# Patient Record
Sex: Female | Born: 1950 | Race: White | Hispanic: No | Smoking: Never smoker
Health system: Southern US, Community
[De-identification: ages and names within clinical notes are randomized; demographics above are authoritative.]

## PROBLEM LIST (undated history)

## (undated) DIAGNOSIS — E785 Hyperlipidemia, unspecified: Secondary | ICD-10-CM

## (undated) DIAGNOSIS — F419 Anxiety disorder, unspecified: Secondary | ICD-10-CM

## (undated) DIAGNOSIS — M1990 Unspecified osteoarthritis, unspecified site: Secondary | ICD-10-CM

## (undated) DIAGNOSIS — R55 Syncope and collapse: Secondary | ICD-10-CM

## (undated) DIAGNOSIS — R0789 Other chest pain: Secondary | ICD-10-CM

## (undated) DIAGNOSIS — K219 Gastro-esophageal reflux disease without esophagitis: Secondary | ICD-10-CM

## (undated) DIAGNOSIS — I5189 Other ill-defined heart diseases: Secondary | ICD-10-CM

## (undated) DIAGNOSIS — R002 Palpitations: Secondary | ICD-10-CM

## (undated) DIAGNOSIS — I10 Essential (primary) hypertension: Secondary | ICD-10-CM

## (undated) DIAGNOSIS — I82409 Acute embolism and thrombosis of unspecified deep veins of unspecified lower extremity: Secondary | ICD-10-CM

## (undated) DIAGNOSIS — J690 Pneumonitis due to inhalation of food and vomit: Secondary | ICD-10-CM

## (undated) DIAGNOSIS — R197 Diarrhea, unspecified: Secondary | ICD-10-CM

## (undated) DIAGNOSIS — M8580 Other specified disorders of bone density and structure, unspecified site: Secondary | ICD-10-CM

## (undated) HISTORY — PX: KNEE ARTHROSCOPY: SUR90

## (undated) HISTORY — DX: Anxiety disorder, unspecified: F41.9

## (undated) HISTORY — PX: REPLACEMENT TOTAL KNEE: SUR1224

## (undated) HISTORY — DX: Diarrhea, unspecified: R19.7

## (undated) HISTORY — DX: Unspecified osteoarthritis, unspecified site: M19.90

## (undated) HISTORY — DX: Other chest pain: R07.89

## (undated) HISTORY — DX: Palpitations: R00.2

## (undated) HISTORY — DX: Hyperlipidemia, unspecified: E78.5

## (undated) HISTORY — DX: Acute embolism and thrombosis of unspecified deep veins of unspecified lower extremity: I82.409

## (undated) HISTORY — DX: Pneumonitis due to inhalation of food and vomit: J69.0

## (undated) HISTORY — DX: Other specified disorders of bone density and structure, unspecified site: M85.80

## (undated) HISTORY — DX: Syncope and collapse: R55

## (undated) HISTORY — DX: Other ill-defined heart diseases: I51.89

---

## 1978-06-24 HISTORY — PX: TUBAL LIGATION: SHX77

## 1998-09-09 ENCOUNTER — Other Ambulatory Visit: Admission: RE | Admit: 1998-09-09 | Discharge: 1998-09-09 | Payer: Self-pay | Admitting: Internal Medicine

## 1998-10-03 ENCOUNTER — Encounter: Payer: Self-pay | Admitting: Internal Medicine

## 1998-10-03 ENCOUNTER — Ambulatory Visit (HOSPITAL_COMMUNITY): Admission: RE | Admit: 1998-10-03 | Discharge: 1998-10-03 | Payer: Self-pay | Admitting: Internal Medicine

## 1998-10-21 ENCOUNTER — Encounter: Payer: Self-pay | Admitting: Internal Medicine

## 1998-10-21 ENCOUNTER — Ambulatory Visit (HOSPITAL_COMMUNITY): Admission: RE | Admit: 1998-10-21 | Discharge: 1998-10-21 | Payer: Self-pay

## 1999-01-18 ENCOUNTER — Emergency Department (HOSPITAL_COMMUNITY): Admission: EM | Admit: 1999-01-18 | Discharge: 1999-01-18 | Payer: Self-pay | Admitting: Emergency Medicine

## 1999-01-19 ENCOUNTER — Encounter: Payer: Self-pay | Admitting: Emergency Medicine

## 1999-01-19 ENCOUNTER — Ambulatory Visit (HOSPITAL_COMMUNITY): Admission: RE | Admit: 1999-01-19 | Discharge: 1999-01-19 | Payer: Self-pay | Admitting: Emergency Medicine

## 1999-04-20 ENCOUNTER — Ambulatory Visit (HOSPITAL_COMMUNITY): Admission: RE | Admit: 1999-04-20 | Discharge: 1999-04-20 | Payer: Self-pay | Admitting: Internal Medicine

## 1999-04-20 ENCOUNTER — Encounter: Payer: Self-pay | Admitting: Internal Medicine

## 2000-01-31 ENCOUNTER — Other Ambulatory Visit: Admission: RE | Admit: 2000-01-31 | Discharge: 2000-01-31 | Payer: Self-pay | Admitting: Obstetrics

## 2000-02-01 ENCOUNTER — Encounter (INDEPENDENT_AMBULATORY_CARE_PROVIDER_SITE_OTHER): Payer: Self-pay | Admitting: Specialist

## 2000-02-01 ENCOUNTER — Other Ambulatory Visit: Admission: RE | Admit: 2000-02-01 | Discharge: 2000-02-01 | Payer: Self-pay | Admitting: Obstetrics

## 2000-03-12 ENCOUNTER — Emergency Department (HOSPITAL_COMMUNITY): Admission: EM | Admit: 2000-03-12 | Discharge: 2000-03-12 | Payer: Self-pay

## 2000-10-15 ENCOUNTER — Ambulatory Visit (HOSPITAL_BASED_OUTPATIENT_CLINIC_OR_DEPARTMENT_OTHER): Admission: RE | Admit: 2000-10-15 | Discharge: 2000-10-15 | Payer: Self-pay | Admitting: Orthopaedic Surgery

## 2001-01-13 ENCOUNTER — Encounter: Admission: RE | Admit: 2001-01-13 | Discharge: 2001-02-11 | Payer: Self-pay | Admitting: Orthopaedic Surgery

## 2001-03-31 ENCOUNTER — Other Ambulatory Visit: Admission: RE | Admit: 2001-03-31 | Discharge: 2001-03-31 | Payer: Self-pay | Admitting: Internal Medicine

## 2001-04-04 ENCOUNTER — Emergency Department (HOSPITAL_COMMUNITY): Admission: EM | Admit: 2001-04-04 | Discharge: 2001-04-05 | Payer: Self-pay | Admitting: Emergency Medicine

## 2001-10-21 ENCOUNTER — Encounter: Payer: Self-pay | Admitting: Emergency Medicine

## 2001-10-21 ENCOUNTER — Emergency Department (HOSPITAL_COMMUNITY): Admission: EM | Admit: 2001-10-21 | Discharge: 2001-10-21 | Payer: Self-pay | Admitting: Emergency Medicine

## 2001-12-09 ENCOUNTER — Ambulatory Visit (HOSPITAL_COMMUNITY): Admission: RE | Admit: 2001-12-09 | Discharge: 2001-12-09 | Payer: Self-pay | Admitting: Internal Medicine

## 2002-01-08 ENCOUNTER — Encounter: Admission: RE | Admit: 2002-01-08 | Discharge: 2002-01-08 | Payer: Self-pay | Admitting: Internal Medicine

## 2002-01-08 ENCOUNTER — Encounter: Payer: Self-pay | Admitting: Internal Medicine

## 2002-10-08 ENCOUNTER — Encounter (INDEPENDENT_AMBULATORY_CARE_PROVIDER_SITE_OTHER): Payer: Self-pay | Admitting: *Deleted

## 2002-10-08 ENCOUNTER — Ambulatory Visit (HOSPITAL_COMMUNITY): Admission: RE | Admit: 2002-10-08 | Discharge: 2002-10-08 | Payer: Self-pay | Admitting: Gastroenterology

## 2003-01-12 ENCOUNTER — Encounter: Payer: Self-pay | Admitting: Internal Medicine

## 2003-01-12 ENCOUNTER — Encounter: Admission: RE | Admit: 2003-01-12 | Discharge: 2003-01-12 | Payer: Self-pay | Admitting: Internal Medicine

## 2003-07-03 ENCOUNTER — Encounter
Admission: RE | Admit: 2003-07-03 | Discharge: 2003-07-03 | Payer: Self-pay | Admitting: Physical Medicine and Rehabilitation

## 2003-07-03 ENCOUNTER — Encounter: Payer: Self-pay | Admitting: Physical Medicine and Rehabilitation

## 2004-01-18 ENCOUNTER — Ambulatory Visit (HOSPITAL_COMMUNITY): Admission: RE | Admit: 2004-01-18 | Discharge: 2004-01-18 | Payer: Self-pay | Admitting: Internal Medicine

## 2004-05-12 ENCOUNTER — Emergency Department (HOSPITAL_COMMUNITY): Admission: EM | Admit: 2004-05-12 | Discharge: 2004-05-12 | Payer: Self-pay | Admitting: Family Medicine

## 2004-09-24 HISTORY — PX: CHOLECYSTECTOMY: SHX55

## 2005-03-15 ENCOUNTER — Emergency Department (HOSPITAL_COMMUNITY): Admission: EM | Admit: 2005-03-15 | Discharge: 2005-03-15 | Payer: Self-pay | Admitting: Emergency Medicine

## 2005-04-23 ENCOUNTER — Emergency Department (HOSPITAL_COMMUNITY): Admission: EM | Admit: 2005-04-23 | Discharge: 2005-04-23 | Payer: Self-pay | Admitting: Family Medicine

## 2005-11-19 ENCOUNTER — Emergency Department (HOSPITAL_COMMUNITY): Admission: EM | Admit: 2005-11-19 | Discharge: 2005-11-19 | Payer: Self-pay | Admitting: Family Medicine

## 2006-05-22 ENCOUNTER — Emergency Department (HOSPITAL_COMMUNITY): Admission: EM | Admit: 2006-05-22 | Discharge: 2006-05-22 | Payer: Self-pay | Admitting: Family Medicine

## 2009-04-07 ENCOUNTER — Emergency Department (HOSPITAL_COMMUNITY): Admission: EM | Admit: 2009-04-07 | Discharge: 2009-04-07 | Payer: Self-pay | Admitting: Family Medicine

## 2010-04-09 ENCOUNTER — Emergency Department (HOSPITAL_COMMUNITY): Admission: EM | Admit: 2010-04-09 | Discharge: 2010-04-09 | Payer: Self-pay | Admitting: Emergency Medicine

## 2010-12-09 LAB — POCT CARDIAC MARKERS
CKMB, poc: <1 ng/mL — ABNORMAL LOW (ref 1.0–8.0)
Myoglobin, poc: 44.1 ng/mL (ref 12–200)
Troponin i, poc: <0.05 ng/mL (ref 0.00–0.09)

## 2010-12-09 LAB — COMPREHENSIVE METABOLIC PANEL
Albumin: 3.9 g/dL (ref 3.5–5.2)
BUN: 18 mg/dL (ref 6–23)
CO2: 25 mEq/L (ref 19–32)
Calcium: 9 mg/dL (ref 8.4–10.5)
Creatinine, Ser: 0.86 mg/dL (ref 0.4–1.2)
GFR calc non Af Amer: >60 mL/min (ref >60)
Glucose, Bld: 94 mg/dL (ref 70–99)
Sodium: 140 mEq/L (ref 135–145)
Total Protein: 6.8 g/dL (ref 6.0–8.3)

## 2010-12-09 LAB — DIFFERENTIAL
Basophils Absolute: 0 10*3/uL (ref 0.0–0.1)
Basophils Relative: 1 % (ref 0–1)
Eosinophils Absolute: 0.1 10*3/uL (ref 0.0–0.7)
Eosinophils Relative: 1 % (ref 0–5)
Lymphocytes Relative: 28 % (ref 12–46)
Lymphs Abs: 1.9 10*3/uL (ref 0.7–4.0)
Monocytes Absolute: 0.5 10*3/uL (ref 0.1–1.0)
Monocytes Relative: 8 % (ref 3–12)
Neutro Abs: 4.2 10*3/uL (ref 1.7–7.7)
Neutrophils Relative %: 63 % (ref 43–77)

## 2010-12-09 LAB — CBC
Hemoglobin: 14.2 g/dL (ref 12.0–15.0)
MCH: 31.8 pg (ref 26.0–34.0)
Platelets: 278 10*3/uL (ref 150–400)
RBC: 4.45 MIL/uL (ref 3.87–5.11)
RDW: 12.8 % (ref 11.5–15.5)
WBC: 6.7 10*3/uL (ref 4.0–10.5)

## 2010-12-09 LAB — TSH: TSH: 1.494 u[IU]/mL (ref 0.350–4.500)

## 2010-12-09 LAB — T4: T4, Total: 10.8 ug/dL (ref 5.0–12.5)

## 2010-12-31 LAB — POCT URINALYSIS DIP (DEVICE)
Bilirubin Urine: NEGATIVE
Nitrite: NEGATIVE
Protein, ur: NEGATIVE mg/dL
Specific Gravity, Urine: 1.015 (ref 1.005–1.030)
Urobilinogen, UA: 0.2 mg/dL (ref 0.0–1.0)
pH: 5.5 (ref 5.0–8.0)

## 2011-02-09 NOTE — Op Note (Signed)
NAME:  Erin West, Erin West                          ACCOUNT NO.:  [id number]   MEDICAL RECORD NO.:  [id number]                   PATIENT TYPE:  AMB   LOCATION:  ENDO                                 FACILITY:  [facility]   PHYSICIAN:  Anselmo Rod, M.D.               DATE OF BIRTH:  [birth year]   DATE OF PROCEDURE:  10/08/2002  DATE OF DISCHARGE:                                 OPERATIVE REPORT   PROCEDURE:  Esophagogastroduodenoscopy with biopsies.   ENDOSCOPIST:  Anselmo Rod, M.D.   INSTRUMENT USED:  Olympus video panendoscope.   INDICATIONS FOR PROCEDURE:  A 60 year old white female with epigastric pain,  rule out peptic ulcer disease, esophagitis, gastritis, etc.   PREPROCEDURE PREPARATION:  Informed consent was procured from the patient  and the patient was fasted for eight hours prior to the procedure.   PREPROCEDURE PHYSICAL EXAMINATION:  VITAL SIGNS:  Stable.  NECK:  Supple.  CHEST:  Clear to auscultation.  S1 and S2 regular.  ABDOMEN:  Soft with normal bowel sounds.   DESCRIPTION OF PROCEDURE:  The patient was placed in the left lateral  decubitus position and sedated with 50 mg of Demerol and 4 mg of Versed  intravenously.  Once the patient was adequately sedated and maintained on  low flow oxygen and continuous cardiac monitoring, the Olympus video  panendoscope was advanced through the mouthpiece, over tongue, and into the  esophagus under direct vision. The entire esophagus appeared normal with no  evidence of ring, stricture, masses, esophagitis, or Barrett's mucosa. The  scope was then advanced to the stomach. There was a patch of gastritis seen  in the mid body of the stomach with antral erosions.  Biopsies were done  from the mid body and the antrum to rule out presence of H.pylori pathology.  The rest of the gastric mucosa including the high cardia and fundus appeared  normal. A small hiatal hernia was seen on high retroflexion.  The proximal  small bowel  appeared normal as well.   IMPRESSION:  1. Normal appearing esophagus and proximal small bowel.  2. Small hiatal hernia seen on retroflexion.  3. Antral erosions with a small patch of gastritis in mid body of the     stomach.  4. Biopsies done to rule out H.pylori by pathology.    RECOMMENDATIONS:  Continue PPIs.  Avoid all nonsteroidals including aspirin.  Treat with antibiotics if H.pylori present on pathology.  Proceed with  colonoscopy at this time.                                                Anselmo Rod, M.D.    JNM/MEDQ  D:  10/08/2002  T:  10/08/2002  Job:  [id number]   cc:   Melina Schools  Kennon Portela, M.D.  [address]  [address]  [place]  [place] [address]  Fax: [phone]

## 2011-02-09 NOTE — Op Note (Signed)
[facility]  Patient:    Erin West, Erin West                         MRN: [id number] Proc. Date: 10/15/00 Adm. Date:  [id number] Attending:  Marcene Corning                           Operative Report  PREOPERATIVE DIAGNOSES: 1. Right knee torn medial meniscus. 2. Right knee degenerative joint disease.  POSTOPERATIVE DIAGNOSIS:  Right knee degenerative joint disease.  PROCEDURE:  Chondroplasty, right knee.  ANESTHESIA:  Knee block.  SURGEON:  Lubertha Basque. Jerl Santos, M.D.  ASSISTANT:  Prince Rome, P.A.  INDICATION FOR PROCEDURE:  The patient is a 60 year old woman with a long history of right knee pain.  She received transient relief after intra-articular injection on two occasions.  She has failed to respond to oral anti-inflammatories.  At this point, she is offered operative intervention that consists of an arthroscopy.  The procedure was discussed with the patient, and informed operative consent was obtained after discussion of the possible complications of, reaction to anesthesia, and infection.  DESCRIPTION OF PROCEDURE:  The patient was taken to the operating suite, where a knee block anesthetic was applied without difficulty.  She was positioned supine and prepped and draped in normal sterile fashion.  After the administration of preoperative IV antibiotics, then arthroscopy of the right knee was performed through a total of two portals.  The suprapatellar pouch was benign, while the patellofemoral joint exhibited some mild degenerative change.  The medial compartment exhibited bone-on-bone contact with complete loss of articular cartilage on a portion of the medial femoral condyle and the medial tibial plateau.  The medial meniscus itself was intact.  The ACL and PCL were intact, and the lateral compartment was completely benign.  I performed a thorough chondroplasty in the medial aspect of the knee, smoothing off some rough surfaces.  I  removed some small cartilaginous loose bodies. The knee was thoroughly irrigated at the end of the case, followed by placement of Marcaine with epinephrine and morphine plus Depo-Medrol.  Adaptic was placed over the portals, followed by dry gauze and a loose Ace wrap. Estimated blood loss and intraoperative fluids can be obtained from anesthesia records.  DISPOSITION:  The patient was taken to the recovery room in stable condition. Plans were for her to go home the same day and follow up in the office in less than a week.  I will contact her by phone tonight. DD:  10/15/00 TD:  10/15/00 Job: [id number]

## 2011-02-09 NOTE — Op Note (Signed)
   NAME:  Erin West, Erin West                          ACCOUNT NO.:  [id number]   MEDICAL RECORD NO.:  [id number]                   PATIENT TYPE:  AMB   LOCATION:  ENDO                                 FACILITY:  [facility]   PHYSICIAN:  Anselmo Rod, M.D.               DATE OF BIRTH:  [birth year]   DATE OF PROCEDURE:  10/08/2002  DATE OF DISCHARGE:                                 OPERATIVE REPORT   PROCEDURE:  Colonoscopy with snare polypectomy x2 and cold biopsies x2.   ENDOSCOPIST:  Anselmo Rod, M.D.   INSTRUMENT USED:  Olympus video colonoscope.   INDICATIONS FOR PROCEDURE:  A 60 year old white female undergoing flexible  sigmoidoscopy, but was changed to colonoscopy as polyps were seen on  flexible sigmoidoscopy.   PREPROCEDURE PREPARATION:  Informed consent was procured from the patient.  The patient was fasted for eight hours prior to the procedure and prepped  with a bottle of MiraLax the night prior to the procedure.   PREPROCEDURE PHYSICAL EXAMINATION:  VITAL SIGNS:  Stable.  NECK:  Supple.  CHEST:  Clear to auscultation, S1 and S2 regular.  ABDOMEN:  Soft with normal bowel sounds.   DESCRIPTION OF PROCEDURE:  The patient was placed in the left lateral  decubitus position and sedated with Demerol and Versed for the EGD, no  additional sedation was used for the colonoscopy.  Once the patient was  adequately positioned and maintained on low flow oxygen with continuous  cardiac monitoring, the Olympus video colonoscope was advanced from the  rectum to the cecum without difficulty. There was some residual stool in the  colon on the right side. Multiple washings were done, and a small polyp was  snared from 55 cm, another polyp was snared from the rectum. A small sessile  polyp was biopsied from the rectum as well.  The transverse colon, right  colon, and cecum appeared normal.  The appendiceal orifice and ileocecal  valve were clearly visualized and photographed.   IMPRESSION:  1. Three colonic polyps removed (see description above).  2. No diverticulosis, erosions, or ulcerations seen.  3. Normal appearing cecum, right colon, and transverse colon.    RECOMMENDATIONS:  Await pathology results.  Avoid all nonsteroidals  including aspirin for now.  Outpatient follow-up in the next two weeks for  further recommendations.                                               Anselmo Rod, M.D.    JNM/MEDQ  D:  10/08/2002  T:  10/08/2002  Job:  [id number]   cc:   Candyce Churn. Allyne Gee, M.D.  [address]  [address]  [place]  [place] [address]  Fax: [phone]

## 2011-03-31 ENCOUNTER — Inpatient Hospital Stay (INDEPENDENT_AMBULATORY_CARE_PROVIDER_SITE_OTHER)
Admission: RE | Admit: 2011-03-31 | Discharge: 2011-03-31 | Disposition: A | Discharge status: Home / Self Care | Source: Ambulatory Visit | Attending: Family Medicine | Admitting: Family Medicine

## 2011-03-31 DIAGNOSIS — S335XXA Sprain of ligaments of lumbar spine, initial encounter: Secondary | ICD-10-CM

## 2011-03-31 DIAGNOSIS — M461 Sacroiliitis, not elsewhere classified: Secondary | ICD-10-CM

## 2011-09-13 ENCOUNTER — Ambulatory Visit (HOSPITAL_COMMUNITY)
Admission: RE | Admit: 2011-09-13 | Discharge: 2011-09-13 | Disposition: A | Discharge status: Home / Self Care | Source: Ambulatory Visit | Attending: Gastroenterology | Admitting: Gastroenterology

## 2011-09-13 ENCOUNTER — Other Ambulatory Visit: Payer: Self-pay | Admitting: Gastroenterology

## 2011-09-13 DIAGNOSIS — R1013 Epigastric pain: Secondary | ICD-10-CM

## 2011-09-13 DIAGNOSIS — R110 Nausea: Secondary | ICD-10-CM | POA: Insufficient documentation

## 2011-09-13 DIAGNOSIS — R1032 Left lower quadrant pain: Secondary | ICD-10-CM

## 2011-09-13 MED ORDER — IOHEXOL 300 MG/ML  SOLN: 100.0000 mL | Freq: Once | INTRAMUSCULAR | Status: AC | PRN

## 2011-09-25 DIAGNOSIS — J690 Pneumonitis due to inhalation of food and vomit: Secondary | ICD-10-CM

## 2011-09-25 HISTORY — DX: Pneumonitis due to inhalation of food and vomit: J69.0

## 2012-02-17 ENCOUNTER — Encounter (HOSPITAL_COMMUNITY): Payer: Self-pay | Admitting: *Deleted

## 2012-02-17 ENCOUNTER — Emergency Department (HOSPITAL_COMMUNITY)
Admission: EM | Admit: 2012-02-17 | Discharge: 2012-02-17 | Disposition: A | Discharge status: Home / Self Care | Source: Home / Self Care | Attending: Emergency Medicine | Admitting: Emergency Medicine

## 2012-02-17 DIAGNOSIS — K5289 Other specified noninfective gastroenteritis and colitis: Secondary | ICD-10-CM

## 2012-02-17 DIAGNOSIS — K529 Noninfective gastroenteritis and colitis, unspecified: Secondary | ICD-10-CM

## 2012-02-17 HISTORY — DX: Gastro-esophageal reflux disease without esophagitis: K21.9

## 2012-02-17 HISTORY — DX: Essential (primary) hypertension: I10

## 2012-02-17 MED ORDER — DIPHENOXYLATE-ATROPINE 2.5-0.025 MG PO TABS: 1.0000 | ORAL_TABLET | Freq: Four times a day (QID) | ORAL | Status: DC | PRN

## 2012-02-17 MED ORDER — ONDANSETRON 8 MG PO TBDP: 8.0000 mg | ORAL_TABLET | Freq: Three times a day (TID) | ORAL | Status: AC | PRN

## 2012-02-17 MED ORDER — CIPROFLOXACIN HCL 500 MG PO TABS: 500.0000 mg | ORAL_TABLET | Freq: Two times a day (BID) | ORAL | Status: DC

## 2012-02-17 NOTE — Discharge Instructions (Signed)

## 2012-02-17 NOTE — ED Provider Notes (Signed)
Chief Complaint  Patient presents with  . Headache  . Nausea  . Diarrhea    History of Present Illness:   Erin West is a 61 year old female who has had a four-day history of weakness, nausea without vomiting, abdominal pain, and diarrhea. The abdominal pain is localized to the left lower quadrant and radiates toward the midabdomen. Nothing makes it worse including eating, however it is better after bowel movement. It's described as an ache, rated 4/10 in intensity. She does not have much of an appetite and has lost about 5 pounds. She's had some headache but no fever or chills. She has had 2-4 loose stools per day without blood or mucus. She is on iron for anemia, sore stools have been somewhat dark. She's had no suspicious exposures, no suspicious ingestions, no foreign travel, no recent antibiotics, no exposure to any exotic animals.  Review of Systems:  Other than noted above, the patient denies any of the following symptoms: Systemic:  No fevers, chills, sweats, weight loss or gain, fatigue, or tiredness. ENT:  No nasal congestion, rhinorrhea, or sore throat. Lungs:  No cough, wheezing, or shortness of breath. Cardiac:  No chest pain, syncope, or presyncope. GI:  No abdominal pain, nausea, vomiting, anorexia, diarrhea, constipation, blood in stool or vomitus. GU:  No dysuria, frequency, or urgency.  PMFSH:  Past medical history, family history, social history, meds, and allergies were reviewed.  Physical Exam:   Vital signs:  BP 138/99  Pulse 96  Temp(Src) 99.2 F (37.3 C) (Oral)  Resp 21  SpO2 95% General:  Alert and oriented.  In no distress.  Skin warm and dry.  Good skin turgor, brisk capillary refill. ENT:  No scleral icterus, moist mucous membranes, no oral lesions, pharynx clear. Lungs:  Breath sounds clear and equal bilaterally.  No wheezes, rales, or rhonchi. Heart:  Rhythm regular, without extrasystoles.  No gallops or murmers. Abdomen:  Abdomen is soft, flat, nondistended.  Bowel sounds are hyperactive. She has mild tenderness to palpation in the left lower quadrant without guarding or rebound. No organomegaly or mass. Skin: Clear, warm, and dry.  Good turgor.  Brisk capillary refill.   Assessment:  The encounter diagnosis was Gastroenteritis.  Plan:   1.  The following meds were prescribed:   New Prescriptions   CIPROFLOXACIN (CIPRO) 500 MG TABLET    Take 1 tablet (500 mg total) by mouth every 12 (twelve) hours.   DIPHENOXYLATE-ATROPINE (LOMOTIL) 2.5-0.025 MG PER TABLET    Take 1 tablet by mouth 4 (four) times daily as needed for diarrhea or loose stools.   ONDANSETRON (ZOFRAN ODT) 8 MG DISINTEGRATING TABLET    Take 1 tablet (8 mg total) by mouth every 8 (eight) hours as needed for nausea.   2.  The patient was instructed in symptomatic care and handouts were given. 3.  The patient was told to return if becoming worse in any way, if no better in 2 or 3 days, and given some red flag symptoms that would indicate earlier return. 4.  The patient was told to take only sips of clear liquids for the next 24 hours and then advance to a b.r.a.t. Diet.      Reuben Likes, MD 02/17/12 [phone]

## 2012-02-17 NOTE — ED Notes (Signed)
Pt with onset of nausea/diarrhea and headache Thursday - denies fever - today 2 episodes of diarrhea - yesterday 4 - 5 episodes -  left lower abdominal pain constant  -

## 2012-02-18 ENCOUNTER — Telehealth (HOSPITAL_COMMUNITY): Payer: Self-pay | Admitting: *Deleted

## 2012-02-18 NOTE — ED Notes (Signed)
Pt called to question whether she could take cipro as prescribed by Dr. Lorenz Coaster is her gastroenteritis is not better in 5-7 days because she is having knee surgery in a few weeks.  Pt advised to make her surgeon aware.

## 2012-02-22 ENCOUNTER — Emergency Department (HOSPITAL_COMMUNITY)

## 2012-02-22 ENCOUNTER — Inpatient Hospital Stay (HOSPITAL_COMMUNITY)
Admission: EM | Admit: 2012-02-22 | Discharge: 2012-02-25 | DRG: 179 | Disposition: A | Discharge status: Home / Self Care | Attending: Internal Medicine | Admitting: Internal Medicine

## 2012-02-22 ENCOUNTER — Encounter (HOSPITAL_COMMUNITY): Payer: Self-pay | Admitting: Emergency Medicine

## 2012-02-22 DIAGNOSIS — I10 Essential (primary) hypertension: Secondary | ICD-10-CM | POA: Diagnosis present

## 2012-02-22 DIAGNOSIS — K219 Gastro-esophageal reflux disease without esophagitis: Secondary | ICD-10-CM | POA: Diagnosis present

## 2012-02-22 DIAGNOSIS — R509 Fever, unspecified: Secondary | ICD-10-CM | POA: Diagnosis present

## 2012-02-22 DIAGNOSIS — J690 Pneumonitis due to inhalation of food and vomit: Principal | ICD-10-CM | POA: Diagnosis present

## 2012-02-22 DIAGNOSIS — R000 Tachycardia, unspecified: Secondary | ICD-10-CM

## 2012-02-22 DIAGNOSIS — R197 Diarrhea, unspecified: Secondary | ICD-10-CM

## 2012-02-22 DIAGNOSIS — R55 Syncope and collapse: Secondary | ICD-10-CM | POA: Diagnosis present

## 2012-02-22 DIAGNOSIS — J189 Pneumonia, unspecified organism: Secondary | ICD-10-CM

## 2012-02-22 DIAGNOSIS — D696 Thrombocytopenia, unspecified: Secondary | ICD-10-CM

## 2012-02-22 DIAGNOSIS — E7800 Pure hypercholesterolemia, unspecified: Secondary | ICD-10-CM | POA: Diagnosis present

## 2012-02-22 LAB — COMPREHENSIVE METABOLIC PANEL
AST: 18 U/L (ref 0–37)
Albumin: 3.8 g/dL (ref 3.5–5.2)
Alkaline Phosphatase: 79 U/L (ref 39–117)
BUN: 11 mg/dL (ref 6–23)
CO2: 24 mEq/L (ref 19–32)
Calcium: 9.2 mg/dL (ref 8.4–10.5)
GFR calc Af Amer: >90 mL/min (ref >90)
GFR calc non Af Amer: >90 mL/min (ref >90)
Potassium: 3.7 mEq/L (ref 3.5–5.1)
Sodium: 139 mEq/L (ref 135–145)
Total Protein: 6.6 g/dL (ref 6.0–8.3)

## 2012-02-22 LAB — URINALYSIS, ROUTINE W REFLEX MICROSCOPIC
Hgb urine dipstick: NEGATIVE
Ketones, ur: NEGATIVE mg/dL
Nitrite: NEGATIVE
Urobilinogen, UA: 0.2 mg/dL (ref 0.0–1.0)

## 2012-02-22 LAB — DIFFERENTIAL
Basophils Absolute: 0 10*3/uL (ref 0.0–0.1)
Lymphocytes Relative: 11 % — ABNORMAL LOW (ref 12–46)
Neutro Abs: 6.1 10*3/uL (ref 1.7–7.7)
Neutrophils Relative %: 81 % — ABNORMAL HIGH (ref 43–77)

## 2012-02-22 LAB — CARDIAC PANEL(CRET KIN+CKTOT+MB+TROPI)
Relative Index: INVALID (ref 0.0–2.5)
Troponin I: <0.3 ng/mL (ref <0.30)

## 2012-02-22 LAB — CBC
MCV: 89.7 fL (ref 78.0–100.0)
Platelets: 197 10*3/uL (ref 150–400)
WBC: 7.6 10*3/uL (ref 4.0–10.5)

## 2012-02-22 MED ORDER — LEVOFLOXACIN 750 MG PO TABS
750.0000 mg | ORAL_TABLET | Freq: Every day | ORAL | Status: DC
  Administered 2012-02-22 – 2012-02-25 (×4): 750 mg via ORAL
  Filled 2012-02-22 (×5): qty 1

## 2012-02-22 MED ORDER — IOHEXOL 300 MG/ML  SOLN
80.0000 mL | Freq: Once | INTRAMUSCULAR | Status: AC | PRN
  Administered 2012-02-22: 80 mL via INTRAVENOUS

## 2012-02-22 MED ORDER — SODIUM CHLORIDE 0.9 % IV SOLN
INTRAVENOUS | Status: DC
  Administered 2012-02-22 – 2012-02-23 (×2): via INTRAVENOUS
  Administered 2012-02-24: 75 mL/h via INTRAVENOUS

## 2012-02-22 MED ORDER — PANTOPRAZOLE SODIUM 40 MG PO TBEC
40.0000 mg | DELAYED_RELEASE_TABLET | Freq: Every day | ORAL | Status: DC
  Administered 2012-02-23 – 2012-02-24 (×2): 40 mg via ORAL
  Filled 2012-02-22 (×4): qty 1

## 2012-02-22 MED ORDER — SODIUM CHLORIDE 0.9 % IJ SOLN
3.0000 mL | Freq: Two times a day (BID) | INTRAMUSCULAR | Status: DC
  Administered 2012-02-22 – 2012-02-24 (×2): 3 mL via INTRAVENOUS

## 2012-02-22 MED ORDER — ONDANSETRON HCL 4 MG/2ML IJ SOLN: 4.0000 mg | Freq: Four times a day (QID) | INTRAMUSCULAR | Status: DC | PRN

## 2012-02-22 MED ORDER — LEVOFLOXACIN 500 MG PO TABS: 750.0000 mg | ORAL_TABLET | ORAL | Status: DC

## 2012-02-22 MED ORDER — ONDANSETRON HCL 4 MG PO TABS: 4.0000 mg | ORAL_TABLET | Freq: Four times a day (QID) | ORAL | Status: DC | PRN

## 2012-02-22 MED ORDER — ZOLPIDEM TARTRATE 5 MG PO TABS
5.0000 mg | ORAL_TABLET | Freq: Every evening | ORAL | Status: DC | PRN
  Filled 2012-02-22: qty 1

## 2012-02-22 MED ORDER — SODIUM CHLORIDE 0.9 % IV BOLUS (SEPSIS)
1000.0000 mL | Freq: Once | INTRAVENOUS | Status: AC
  Administered 2012-02-22: 1000 mL via INTRAVENOUS

## 2012-02-22 MED ORDER — ENOXAPARIN SODIUM 40 MG/0.4ML ~~LOC~~ SOLN
40.0000 mg | Freq: Every day | SUBCUTANEOUS | Status: DC
  Administered 2012-02-22 – 2012-02-24 (×3): 40 mg via SUBCUTANEOUS
  Filled 2012-02-22 (×4): qty 0.4

## 2012-02-22 MED ORDER — LEVOFLOXACIN IN D5W 500 MG/100ML IV SOLN
500.0000 mg | INTRAVENOUS | Status: DC
  Filled 2012-02-22: qty 100

## 2012-02-22 MED ORDER — SIMVASTATIN 20 MG PO TABS
20.0000 mg | ORAL_TABLET | Freq: Every day | ORAL | Status: DC
  Administered 2012-02-23 – 2012-02-24 (×2): 20 mg via ORAL
  Filled 2012-02-22 (×4): qty 1

## 2012-02-22 NOTE — ED Notes (Signed)
attemtpted to call report, rn unavailable at this time

## 2012-02-22 NOTE — ED Provider Notes (Addendum)
History     CSN: [id number]  Arrival date & time 02/22/12  1530   First MD Initiated Contact with Patient 02/22/12 1558      Chief Complaint  Patient presents with  . Chills    after endo-sent by Dr. Loreta Ave  . Post-op Problem    (Consider location/radiation/quality/duration/timing/severity/associated sxs/prior treatment) HPI Comments: Patient presents with "severe chills" and lightheadedness after having an endoscopy performed this morning. Patient has had watery diarrhea for the past 6 days. She was seen at urgent care several days ago and discharged with cipro which she has been taking. She followed up with her primary care doctor who sent her to see a gastroenterologist. This culminated in upper endoscopy performed this morning. Patient does not know the results of this test. Patient had a colonoscopy approximately one year ago. Patient states she's been having some left-sided middle and lower abdominal pain with the diarrhea. She does not have worsening pain today. She also states she had an episode of lightheadedness prior to arrival where she nearly passed out. She denies chest pain or shortness of breath. She has had nausea but no vomiting. No recorded fever. Nothing makes symptoms better or worse. Course is constant.   The history is provided by the patient.    Past Medical History  Diagnosis Date  . Hypertension   . GERD (gastroesophageal reflux disease)   . High cholesterol   . Knee pain     Past Surgical History  Procedure Date  . Cholecystectomy   . Tubal ligation     History reviewed. No pertinent family history.  History  Substance Use Topics  . Smoking status: Never Smoker   . Smokeless tobacco: Not on file  . Alcohol Use: No    OB History    Grav Para Term Preterm Abortions TAB SAB Ect Mult Living                  Review of Systems  Constitutional: Positive for chills. Negative for fever.  HENT: Negative for sore throat and rhinorrhea.   Eyes:  Negative for redness.  Respiratory: Negative for cough.   Cardiovascular: Negative for chest pain.  Gastrointestinal: Positive for nausea and abdominal pain. Negative for vomiting and diarrhea.  Genitourinary: Negative for dysuria.  Musculoskeletal: Negative for myalgias.  Skin: Negative for rash.  Neurological: Negative for headaches.    Allergies  Penicillins and Sulfa antibiotics  Home Medications   Current Outpatient Rx  Name Route Sig Dispense Refill  . AMLODIPINE BESYLATE 5 MG PO TABS Oral Take 5 mg by mouth daily.    . ATORVASTATIN CALCIUM 20 MG PO TABS Oral Take 20 mg by mouth daily.    Marland Kitchen BISMUTH SUBSALICYLATE 262 MG/15ML PO SUSP Oral Take 15 mLs by mouth every 6 (six) hours as needed. Upset stomach      . CARVEDILOL 6.25 MG PO TABS Oral Take 6.25 mg by mouth 2 (two) times daily with a meal.    . CIPROFLOXACIN HCL 500 MG PO TABS Oral Take 1 tablet (500 mg total) by mouth every 12 (twelve) hours. 20 tablet 0  . DIPHENOXYLATE-ATROPINE 2.5-0.025 MG PO TABS Oral Take 1 tablet by mouth 4 (four) times daily as needed for diarrhea or loose stools. 16 tablet 0  . ESOMEPRAZOLE MAGNESIUM 40 MG PO CPDR Oral Take 40 mg by mouth daily before breakfast.    . LOSARTAN POTASSIUM 100 MG PO TABS Oral Take 100 mg by mouth daily.    Marland Kitchen NAPROXEN  SODIUM 220 MG PO TABS Oral Take 220 mg by mouth 2 (two) times daily with a meal.    . ONDANSETRON 8 MG PO TBDP Oral Take 1 tablet (8 mg total) by mouth every 8 (eight) hours as needed for nausea. 20 tablet 0    BP 136/83  Pulse 133  Temp 98.9 F (37.2 C)  Resp 18  SpO2 94%  Physical Exam  Nursing note and vitals reviewed. Constitutional: She appears well-developed and well-nourished.  HENT:  Head: Normocephalic and atraumatic.  Eyes: Conjunctivae are normal. Right eye exhibits no discharge. Left eye exhibits no discharge.       Conjunctiva not pale  Neck: Normal range of motion. Neck supple.  Cardiovascular: Regular rhythm and normal heart  sounds.        Mild tachycardia (approx 100) during my exam.   Pulmonary/Chest: Effort normal and breath sounds normal. No respiratory distress. She has no wheezes.  Abdominal: Soft. There is tenderness. There is no rigidity, no rebound, no guarding, no CVA tenderness, no tenderness at McBurney's point and negative Murphy's sign.    Neurological: She is alert.  Skin: Skin is warm and dry.  Psychiatric: She has a normal mood and affect.    ED Course  Procedures (including critical care time)  Labs Reviewed  DIFFERENTIAL - Abnormal; Notable for the following:    Neutrophils Relative 81 (*)    Lymphocytes Relative 11 (*)    All other components within normal limits  CBC  URINALYSIS, ROUTINE W REFLEX MICROSCOPIC  COMPREHENSIVE METABOLIC PANEL   Ct Chest W Contrast  02/22/2012  *RADIOLOGY REPORT*  Clinical Data: Nausea, diarrhea, fever, chills.  CT CHEST WITH CONTRAST  Technique:  Multidetector CT imaging of the chest was performed following the standard protocol during bolus administration of intravenous contrast.  Contrast: 80mL OMNIPAQUE IOHEXOL 300 MG/ML  SOLN  Comparison: Chest x-ray 02/22/2012  Findings: Patchy opacities are seen in the left lung, most pronounced in the left upper lobe/lingula.  Mild patchy opacity in the left lower lobe.  Findings compatible with pneumonia.  Right lung is clear.  No pleural effusion.  Heart is normal size. Aorta is normal caliber. No mediastinal, hilar, or axillary adenopathy.  Visualized thyroid and chest wall soft tissues unremarkable. Imaging into the upper abdomen shows no acute findings.  Prior cholecystectomy.  No acute bony abnormality.  IMPRESSION: Patchy opacities in the left lung, most pronounced in the left upper lobe/lingula compatible with pneumonia as seen on prior chest x-ray.  Original Report Authenticated By: Cyndie Chime, M.D.   Dg Abd Acute W/chest  02/22/2012  *RADIOLOGY REPORT*  Clinical Data: Nausea, chills, mid abdominal pain   ACUTE ABDOMEN SERIES (ABDOMEN 2 VIEW & CHEST 1 VIEW)  Comparison: Chest x-ray of 04/09/2010 and CT abdomen pelvis of 09/13/2011  Findings: There is airspace disease in the left mid and lower lung field suspicious for pneumonia.  A two-view chest x-ray is recommended.  The right lung is clear.  Mediastinal contours are stable.  The heart is within normal limits in size.  Supine and erect views of the abdomen show no bowel obstruction. Air is seen to the rectum.  No free air is noted.  No opaque calculi are seen.  There are surgical clips in the right upper quadrant from prior cholecystectomy.  IMPRESSION:  1.  Parenchymal opacity at the left lung base suspicious for pneumonia.  Recommend two-view chest x-ray. 2.  No bowel obstruction.  No free air.  Original Report  Authenticated By: Juline Patch, M.D.     1. Community acquired pneumonia   2. Tachycardia   3. Near syncope     4:39 PM Patient seen and examined. Work-up initiated. D/w Dr. Freida Busman. X-ray ordered.   Vital signs reviewed and are as follows: Filed Vitals:   02/22/12 1540  BP: 136/83  Pulse: 133  Temp: 98.9 F (37.2 C)  Resp: 18   Patient had a near-syncopal episode while lying on stretcher. She became lightheaded, diaphoretic, BP in 60's systolic. Symptoms resolved after several seconds. No chest pain.   7:52 PM CT confirms PNA. Will admit based on PNA, tachycardia, near syncope.  [facility] team 4.    Date: 02/22/2012  Rate: 103  Rhythm: sinus tachycardia  QRS Axis: normal  Intervals: normal  ST/T Wave abnormalities: normal  Conduction Disutrbances:none  Narrative Interpretation:   Old EKG Reviewed: none available      MDM  Admit for PNA, near syncope.         Renne Crigler, [place] 02/22/12 [address] [place], [place] 06/06/12 [phone]

## 2012-02-22 NOTE — ED Provider Notes (Signed)
Medical screening examination/treatment/procedure(s) were conducted as a shared visit with non-physician practitioner(s) and myself.  I personally evaluated the patient during the encounter  Pt was ill prior to her EGD today. Will order chest CT to confirm pneumonia. Abdominal exam without signs of peritonitis  Toy Baker, MD 02/22/12 [phone]

## 2012-02-22 NOTE — ED Notes (Signed)
Pt states she's been having nausea and diarrhea for a week and has been seen by Urgent Care and had an endoscopy at Dr. Kenna Gilbert office. States she has been having chills and was feeling lightheaded and weak in the car. Pt is tachycardic at this time and states that she does have a hx of palpitations. Temp 98.9

## 2012-02-22 NOTE — ED Notes (Signed)
Attempted to give report, rn unavailble

## 2012-02-22 NOTE — ED Notes (Signed)
Pt states "I feel dizzy and hot" at that time pt blood pressure was 62/30 in supine position, 85/57 in reverse trendelenburg, and 103 in reverse trendelenburg

## 2012-02-22 NOTE — H&P (Signed)
[facility] History and Physical  KENNISHA QIN OZH:[id number] DOB: 1951-07-05 DOA: 02/22/2012   PCP: Gwynneth Aliment, MD, MD   Chief Complaint:  Chief Complaint  Patient presents with  . Chills    after endo-sent by Dr. Loreta Ave  . Post-op Problem     HPI:  61 year old woman without any significant past medical problems, presented to the [facility] emergency room with sudden onset of fever chills associated with some nausea and feeling of passing out. She has had diarrhea for one week associated with some chills. Today she has had an upper endoscopy to evaluate for possible celiac disease. She was evaluated at [facility] on May 26 and was started on empiric antibiotics with ciprofloxacin for possible gastroenteritis. She has been taking her blood pressure medications as prescribed. She was evaluated with a CT angiogram of the chest in the emergency room and found to have infiltrates suggestive of pneumonia  Review of Systems:  As per history of present illness. Patient reports history of passing out with nausea, vomiting, defecation at different moments this year. All other systems reviewed and negative  Past Medical History  Diagnosis Date  . Hypertension   . GERD (gastroesophageal reflux disease)   . High cholesterol   . Knee pain    Past Surgical History  Procedure Date  . Cholecystectomy   . Tubal ligation   . Knee arthroscopy     right knee   Social History:  reports that she has never smoked. She has never used smokeless tobacco. She reports that she does not drink alcohol or use illicit drugs.  Allergies  Allergen Reactions  . Penicillins Swelling  . Sulfa Antibiotics Swelling    History reviewed. No pertinent family history.  Prior to Admission medications   Medication Sig Start Date End Date Taking? Authorizing Provider  amLODipine (NORVASC) 5 MG tablet Take 5 mg by mouth daily.   Yes Historical Provider, MD    atorvastatin (LIPITOR) 20 MG tablet Take 20 mg by mouth daily.   Yes Historical Provider, MD  bismuth subsalicylate (PEPTO BISMOL) 262 MG/15ML suspension Take 15 mLs by mouth every 6 (six) hours as needed. Upset stomach     Yes Historical Provider, MD  carvedilol (COREG) 6.25 MG tablet Take 6.25 mg by mouth 2 (two) times daily with a meal.   Yes Historical Provider, MD  ciprofloxacin (CIPRO) 500 MG tablet Take 1 tablet (500 mg total) by mouth every 12 (twelve) hours. 02/17/12 02/27/12 Yes Reuben Likes, MD  diphenoxylate-atropine (LOMOTIL) 2.5-0.025 MG per tablet Take 1 tablet by mouth 4 (four) times daily as needed for diarrhea or loose stools. 02/17/12 02/27/12 Yes Reuben Likes, MD  esomeprazole (NEXIUM) 40 MG capsule Take 40 mg by mouth daily before breakfast.   Yes Historical Provider, MD  losartan (COZAAR) 100 MG tablet Take 100 mg by mouth daily.   Yes Historical Provider, MD  naproxen sodium (ANAPROX) 220 MG tablet Take 220 mg by mouth 2 (two) times daily with a meal.   Yes Historical Provider, MD  ondansetron (ZOFRAN ODT) 8 MG disintegrating tablet Take 1 tablet (8 mg total) by mouth every 8 (eight) hours as needed for nausea. 02/17/12 02/24/12 Yes Reuben Likes, MD   Physical Exam: Filed Vitals:   02/22/12 2026 02/22/12 2028 02/22/12 2038 02/22/12 2236  BP: 134/81 118/92  122/81  Pulse: 117 123  96  Temp:    98.4 F (36.9 C)  TempSrc:  Oral  Resp:      Height:   5' 4.5" (1.638 m)   Weight:   78.926 kg (174 lb)   SpO2:    95%     General:  Alert and oriented x3  Eyes: Pupil equal round react to light and accommodation  ENT: No pharyngeal erythema  Neck: No jugular venous distention no thyromegaly  Cardiovascular: Regular rate and rhythm without murmurs rubs or gallops  Respiratory: crackles anterior chest left upper lobe  Abdomen: Soft nontender nondistended bowel sounds are present  Skin: No suspicious rashes  Musculoskeletal: Intact muscle bulk and  tone  Psychiatric: Euthymic  Neurologic: Cranial nerves 2-12 intact  Labs on Admission:  Basic Metabolic Panel:  Lab 02/22/12 4098  NA 139  K 3.7  CL 103  CO2 24  GLUCOSE 89  BUN 11  CREATININE 0.73  CALCIUM 9.2  MG --  PHOS --   Liver Function Tests:  Lab 02/22/12 1730  AST 18  ALT 22  ALKPHOS 79  BILITOT 0.7  PROT 6.6  ALBUMIN 3.8   No results found for this basename: LIPASE:5,AMYLASE:5 in the last 168 hours No results found for this basename: AMMONIA:5 in the last 168 hours CBC:  Lab 02/22/12 1730  WBC 7.6  NEUTROABS 6.1  HGB 14.9  HCT 42.5  MCV 89.7  PLT 197   Cardiac Enzymes: No results found for this basename: CKTOTAL:5,CKMB:5,CKMBINDEX:5,TROPONINI:5 in the last 168 hours BNP: No components found with this basename: POCBNP:5 CBG: No results found for this basename: GLUCAP:5 in the last 168 hours  Radiological Exams on Admission: Ct Chest W Contrast  02/22/2012  *RADIOLOGY REPORT*  Clinical Data: Nausea, diarrhea, fever, chills.  CT CHEST WITH CONTRAST  Technique:  Multidetector CT imaging of the chest was performed following the standard protocol during bolus administration of intravenous contrast.  Contrast: 80mL OMNIPAQUE IOHEXOL 300 MG/ML  SOLN  Comparison: Chest x-ray 02/22/2012  Findings: Patchy opacities are seen in the left lung, most pronounced in the left upper lobe/lingula.  Mild patchy opacity in the left lower lobe.  Findings compatible with pneumonia.  Right lung is clear.  No pleural effusion.  Heart is normal size. Aorta is normal caliber. No mediastinal, hilar, or axillary adenopathy.  Visualized thyroid and chest wall soft tissues unremarkable. Imaging into the upper abdomen shows no acute findings.  Prior cholecystectomy.  No acute bony abnormality.  IMPRESSION: Patchy opacities in the left lung, most pronounced in the left upper lobe/lingula compatible with pneumonia as seen on prior chest x-ray.  Original Report Authenticated By: Cyndie Chime, M.D.   Dg Abd Acute W/chest  02/22/2012  *RADIOLOGY REPORT*  Clinical Data: Nausea, chills, mid abdominal pain  ACUTE ABDOMEN SERIES (ABDOMEN 2 VIEW & CHEST 1 VIEW)  Comparison: Chest x-ray of 04/09/2010 and CT abdomen pelvis of 09/13/2011  Findings: There is airspace disease in the left mid and lower lung field suspicious for pneumonia.  A two-view chest x-ray is recommended.  The right lung is clear.  Mediastinal contours are stable.  The heart is within normal limits in size.  Supine and erect views of the abdomen show no bowel obstruction. Air is seen to the rectum.  No free air is noted.  No opaque calculi are seen.  There are surgical clips in the right upper quadrant from prior cholecystectomy.  IMPRESSION:  1.  Parenchymal opacity at the left lung base suspicious for pneumonia.  Recommend two-view chest x-ray. 2.  No bowel obstruction.  No free air.  Original Report Authenticated By: Juline Patch, M.D.    EKG: Independently reviewed.   Assessment/Plan Principal Problem:  *Aspiration pneumonia Active Problems:  Hypertension  GERD (gastroesophageal reflux disease)  Diarrhea  Syncope   1. Probable aspiration pneumonia versus pneumonia associated with the infectious process causing gastroenteritis. Plan to admit the patient to the hospital and start intravenous antibiotics. I will send a Legionella antigen. 2. Diarrhea-based on patient's history this seems to be a self-limiting issue probably due to the viral gastroenteritis. If she continues to have diarrhea we will send stool samples for bacterial pathogens and parasites. 3.  We will give the patient IV fluids, keep her on telemetry and check orthostatics in the morning due to her feeling of almost passing out.  Code Status: Full Family Communication: Husband Disposition Plan: Home  Halbert Jesson, MD  [facility] Pager [phone]  If 7PM-7AM, please contact night-coverage [email or web address] Password  [facility] 02/22/2012, 11:07 PM

## 2012-02-22 NOTE — ED Notes (Signed)
Pt had an endoscopy by Dr. Loreta Ave this am and has had severe chills since.  Pt c/o diarrhea x 1 week.

## 2012-02-23 DIAGNOSIS — R55 Syncope and collapse: Secondary | ICD-10-CM

## 2012-02-23 DIAGNOSIS — D696 Thrombocytopenia, unspecified: Secondary | ICD-10-CM

## 2012-02-23 DIAGNOSIS — R197 Diarrhea, unspecified: Secondary | ICD-10-CM

## 2012-02-23 LAB — BASIC METABOLIC PANEL WITH GFR
BUN: 10 mg/dL (ref 6–23)
CO2: 24 meq/L (ref 19–32)
Calcium: 8.6 mg/dL (ref 8.4–10.5)
Chloride: 107 meq/L (ref 96–112)
Creatinine, Ser: 0.81 mg/dL (ref 0.50–1.10)
GFR calc Af Amer: 89 mL/min — ABNORMAL LOW
GFR calc non Af Amer: 77 mL/min — ABNORMAL LOW
Glucose, Bld: 91 mg/dL (ref 70–99)
Potassium: 3.7 meq/L (ref 3.5–5.1)
Sodium: 139 meq/L (ref 135–145)

## 2012-02-23 LAB — CBC
MCHC: 33.9 g/dL (ref 30.0–36.0)
MCV: 91 fL (ref 78.0–100.0)
Platelets: 192 10*3/uL (ref 150–400)
RDW: 12.9 % (ref 11.5–15.5)
WBC: 7 10*3/uL (ref 4.0–10.5)

## 2012-02-23 MED ORDER — LOSARTAN POTASSIUM 50 MG PO TABS
100.0000 mg | ORAL_TABLET | Freq: Every day | ORAL | Status: DC
  Administered 2012-02-24 – 2012-02-25 (×2): 100 mg via ORAL
  Filled 2012-02-23 (×3): qty 2

## 2012-02-23 MED ORDER — ACETAMINOPHEN 325 MG PO TABS
650.0000 mg | ORAL_TABLET | Freq: Four times a day (QID) | ORAL | Status: DC | PRN
  Administered 2012-02-23 – 2012-02-25 (×4): 650 mg via ORAL
  Filled 2012-02-23 (×4): qty 2

## 2012-02-23 MED ORDER — ONDANSETRON 8 MG PO TBDP: 8.0000 mg | ORAL_TABLET | Freq: Three times a day (TID) | ORAL | Status: DC | PRN

## 2012-02-23 MED ORDER — LORAZEPAM 1 MG PO TABS
1.0000 mg | ORAL_TABLET | Freq: Every evening | ORAL | Status: DC | PRN
  Administered 2012-02-23 – 2012-02-24 (×3): 1 mg via ORAL
  Filled 2012-02-23 (×3): qty 1

## 2012-02-23 MED ORDER — ONDANSETRON HCL 4 MG PO TABS: 4.0000 mg | ORAL_TABLET | Freq: Four times a day (QID) | ORAL | Status: DC | PRN

## 2012-02-23 MED ORDER — AMLODIPINE BESYLATE 5 MG PO TABS
5.0000 mg | ORAL_TABLET | Freq: Every day | ORAL | Status: DC
  Administered 2012-02-24 – 2012-02-25 (×2): 5 mg via ORAL
  Filled 2012-02-23 (×3): qty 1

## 2012-02-23 MED ORDER — ONDANSETRON HCL 4 MG/2ML IJ SOLN: 4.0000 mg | Freq: Four times a day (QID) | INTRAMUSCULAR | Status: DC | PRN

## 2012-02-23 MED ORDER — BISMUTH SUBSALICYLATE 262 MG/15ML PO SUSP
15.0000 mL | Freq: Four times a day (QID) | ORAL | Status: DC | PRN
  Filled 2012-02-23: qty 236

## 2012-02-23 MED ORDER — DIPHENOXYLATE-ATROPINE 2.5-0.025 MG PO TABS: 1.0000 | ORAL_TABLET | Freq: Four times a day (QID) | ORAL | Status: DC | PRN

## 2012-02-23 MED ORDER — CARVEDILOL 6.25 MG PO TABS
6.2500 mg | ORAL_TABLET | Freq: Two times a day (BID) | ORAL | Status: DC
  Administered 2012-02-24 – 2012-02-25 (×3): 6.25 mg via ORAL
  Filled 2012-02-23 (×6): qty 1

## 2012-02-23 NOTE — ED Provider Notes (Signed)
Medical screening examination/treatment/procedure(s) were conducted as a shared visit with non-physician practitioner(s) and myself.  I personally evaluated the patient during the encounter  Bernarr Longsworth T Carletha Dawn, MD 02/23/12 1507 

## 2012-02-23 NOTE — Progress Notes (Signed)
Patient ID: Erin West, female   DOB: January 11, 1951, 61 y.o.   MRN: [id number]  Subjective: No events overnight. Patient denies chest pain, shortness of breath, abdominal pain. Pt denies diarrhea since admission.   Objective:  Vital signs in last 24 hours:  Filed Vitals:   02/23/12 0600 02/23/12 0605 02/23/12 0615 02/23/12 1355  BP: 102/68 115/76 102/62 111/80  Pulse: 84 88 98 78  Temp: 98.2 F (36.8 C)   98.4 F (36.9 C)  SpO2: 98%   95%   Intake/Output from previous day:  Intake/Output Summary (Last 24 hours) at 02/23/12 1408 Last data filed at 02/23/12 1355  Gross per 24 hour  Intake 2233.75 ml  Output   1000 ml  Net 1233.75 ml    Physical Exam: General: Alert, awake, oriented x3, in no acute distress. HEENT: No bruits, no goiter. Moist mucous membranes, no scleral icterus, no conjunctival pallor. Heart: Regular rate and rhythm, S1/S2 +, no murmurs, rubs, gallops. Lungs: Clear to auscultation bilaterally but slightly decreased sounds at. No wheezing, no rhonchi, no rales.  Abdomen: Soft, nontender, nondistended, positive bowel sounds. Extremities: No clubbing or cyanosis, no pitting edema,  positive pedal pulses. Neuro: Grossly nonfocal.  Lab Results:  Lab 02/23/12 0530 02/22/12 1730  WBC 7.0 7.6  HGB 12.4 14.9  HCT 36.6 42.5  PLT 192 197    Lab 02/23/12 0530 02/22/12 1730  NA 139 139  K 3.7 3.7  CL 107 103  CO2 24 24  GLUCOSE 91 89  BUN 10 11  CREATININE 0.81 0.73  CALCIUM 8.6 9.2   Studies/Results:  Ct Chest W Contrast 02/22/2012     IMPRESSION:  Patchy opacities in the left lung, most pronounced in the left upper lobe/lingula compatible with pneumonia as seen on prior chest x-ray.    Dg Abd Acute W/chest 02/22/2012    IMPRESSION:   1.  Parenchymal opacity at the left lung base suspicious for pneumonia.  Recommend two-view chest x-ray.  2.  No bowel obstruction.  No free air.     Medications: Scheduled Meds:   . enoxaparin  40 mg Subcutaneous  QHS  . levofloxacin  750 mg Oral Daily  . pantoprazole  40 mg Oral Daily  . simvastatin  20 mg Oral Daily  . sodium chloride  1,000 mL Intravenous Once  . sodium chloride  3 mL Intravenous Q12H   Continuous Infusions:   . sodium chloride 75 mL/hr at 02/23/12 1031   PRN Meds:.acetaminophen, iohexol, LORazepam, ondansetron (ZOFRAN) IV, ondansetron, DISCONTD: zolpidem  Assessment/Plan:  Principal Problem:  *Aspiration pneumonia - pt clinically improving - will continue antibiotic as noted above - continue to monitor vitals per floor protocol  Active Problems:  Hypertension - well controlled to this point - will monitor on telemetry   GERD (gastroesophageal reflux disease) - clinically stable - will continue Protonix   Diarrhea - pt denies diarrhea over 24 hour period - will check stool for O&P if recurs   Syncope - unclear etiology and perhaps secondary to dehydration - no event on telemetry overnight - PT evaluation   EDUCATION - test results and diagnostic studies were discussed with patient  - patient verbalized the understanding - questions were answered at the bedside and contact information was provided for additional questions or concerns   LOS: 1 day   MAGICK-Dayla Gasca 02/23/2012, 2:08 PM  [facility] Pager: [phone]

## 2012-02-23 NOTE — Progress Notes (Signed)
Patient BP 108/70 HR-72 with 3 scheduled blood pressure medication. Dr. Izola Price notified and stated to hold medication for now and will F/U in AM. Patient aware.

## 2012-02-24 ENCOUNTER — Inpatient Hospital Stay (HOSPITAL_COMMUNITY)

## 2012-02-24 LAB — BASIC METABOLIC PANEL
CO2: 23 mEq/L (ref 19–32)
Chloride: 109 mEq/L (ref 96–112)
Glucose, Bld: 95 mg/dL (ref 70–99)
Potassium: 3.7 mEq/L (ref 3.5–5.1)
Sodium: 139 mEq/L (ref 135–145)

## 2012-02-24 LAB — LEGIONELLA ANTIGEN, URINE

## 2012-02-24 LAB — CBC
Hemoglobin: 12.3 g/dL (ref 12.0–15.0)
Platelets: 178 10*3/uL (ref 150–400)
RBC: 3.98 MIL/uL (ref 3.87–5.11)
WBC: 5.3 10*3/uL (ref 4.0–10.5)

## 2012-02-24 MED ORDER — LEVOFLOXACIN 750 MG PO TABS: 750.0000 mg | ORAL_TABLET | Freq: Every day | ORAL | Status: AC

## 2012-02-24 MED ORDER — IOHEXOL 300 MG/ML  SOLN: 100.0000 mL | Freq: Once | INTRAMUSCULAR | Status: AC | PRN

## 2012-02-24 MED ORDER — IOHEXOL 300 MG/ML  SOLN
100.0000 mL | Freq: Once | INTRAMUSCULAR | Status: AC | PRN
  Administered 2012-02-24: 100 mL via INTRAVENOUS

## 2012-02-24 NOTE — Discharge Instructions (Addendum)
Aspiration Pneumonia Aspiration pneumonia is an infection in your lungs. It occurs when you breathe (aspirate) things into your lungs such as food, vomit, or liquid. When these things get into your lungs, swelling (inflammation) can occur. This can make it difficult for you to breath. Aspiration pneumonia is a serious condition and can be life threatening.  CAUSES  Aspiration pneumonia can have many causes. Some of the causes include:  Having a brain injury or disease:   Stroke.   Seizures.   Confusion (Dementia).   ALS (Amyotrophic Lateral Sclerosis, or Lou Gehrig's disease).   Parkinson's disease.   Other causes of aspiration pneumonia include:   Being under general anesthesia for procedures.   Being in a coma (unconscious). This unconscious state can be caused by medicine, illegal drugs, injury or disease. Being in a coma can decrease a person's cough (gag) reflex. Aspiration can occur when a person is not awake enough to cough if something goes into the lungs.   A narrowing of the esophagus (the tube that carries food to the stomach).   Having dental problems that makes it hard to swallow.   Drinking too much alcohol. If a person passes out and vomits, vomit can be swallowed into the lungs.   Taking certain medications. Tranquilizers and sedatives can sometimes decrease your swallowing or gag reflex.  SYMPTOMS   Coughing after swallowing food or liquids.   Breathing problems. These could include wheezing (a whistling sound) or shortness of breath.   Bluish skin. This can be caused by lack of oxygen.   Coughing up food or mucus. The mucus might contain blood, pus or greenish material.   Fever.   Chest pain.   Fatigue (being more tired than usual).   Sweating more than usual.   Bad breath.  DIAGNOSIS  A physical examination and testing will be needed to see if aspiration pneumonia is present. This can include:  A review of the above symptoms.   Chest X-ray.  This is a picture of the lungs.   Listening to your lungs with a stethoscope. Your healthcare provider will listen for:   Crackling sounds in the lungs.   Decreased breath sounds.   A rapid heartbeat.   Swallowing study. This test looks at how food is swallowed and whether it goes into your breathing tube (trachea) or food pipe (esophagus).   Sputum culture. Sputum (saliva and mucus) is collected from the lungs or bronchi (tubes that carry air to the lungs). It is then tested for bacteria.   Computed tomography. This is called a CT scan. It also can show lung damage.   Bronchoscopy. This test uses a flexible tube (bronchoscope) to see inside the lungs.  TREATMENT  Treatment will depend on how severe the aspiration pneumonia is and what led to it.  Some people may need to be treated in the hospital. Your breathing will be carefully monitored. Depending on how well you are breathing, you may:   Be able to breath on your own but need oxygen.   Need breathing support via a breathing machine (ventilator).   Medication:   Antibiotics or anti-fungal drugs might be prescribed. The particular medicine will depend on what caused the infection.   Other drugs may be given to reduce fever and/or pain.   Other treatments or corrections may be needed. For example:   Following a recommended diet. This is especially important if the swallowing study was failed.   Revising medications.   Fixing dental problems.  Correcting breathing obstructions.   Treating stomach disorders.   Dealing with alcohol issues.   Having a feeding tube placed in the stomach.  HOME CARE INSTRUCTIONS   Take any medicines that were prescribed. Follow the directions carefully.   Check with your caregiver before taking over-the-counter medications.   Rest as instructed by your caregiver.   Keep all follow-up appointments with your healthcare provider. This is important so the caregiver can make sure the  pneumonia is gone.  SEEK MEDICAL CARE IF:  Any of these symptoms return:  Shortness of breath or difficulty breathing.   Wheezing.   Fever of more than 100.5 F (38.1 C) or as recommended by your caregiver.   Chest pain.  MAKE SURE YOU:   Understand these instructions.   Will watch your condition.   Will get help right away if you are not doing well or get worse.  Document Released: 07/08/2009 Document Revised: 08/30/2011 Document Reviewed: 07/08/2009 [employer] Patient Information 2012 [employer], [employer]CAspiration Pneumonia Aspiration pneumonia is an infection in your lungs. It occurs when you breathe (aspirate) things into your lungs such as food, vomit, or liquid. When these things get into your lungs, swelling (inflammation) can occur. This can make it difficult for you to breath. Aspiration pneumonia is a serious condition and can be life threatening.  CAUSES  Aspiration pneumonia can have many causes. Some of the causes include:  Having a brain injury or disease:   Stroke.   Seizures.   Confusion (Dementia).   ALS (Amyotrophic Lateral Sclerosis, or Lou Gehrig's disease).   Parkinson's disease.   Other causes of aspiration pneumonia include:   Being under general anesthesia for procedures.   Being in a coma (unconscious). This unconscious state can be caused by medicine, illegal drugs, injury or disease. Being in a coma can decrease a person's cough (gag) reflex. Aspiration can occur when a person is not awake enough to cough if something goes into the lungs.   A narrowing of the esophagus (the tube that carries food to the stomach).   Having dental problems that makes it hard to swallow.   Drinking too much alcohol. If a person passes out and vomits, vomit can be swallowed into the lungs.   Taking certain medications. Tranquilizers and sedatives can sometimes decrease your swallowing or gag reflex.  SYMPTOMS   Coughing after swallowing food or liquids.    Breathing problems. These could include wheezing (a whistling sound) or shortness of breath.   Bluish skin. This can be caused by lack of oxygen.   Coughing up food or mucus. The mucus might contain blood, pus or greenish material.   Fever.   Chest pain.   Fatigue (being more tired than usual).   Sweating more than usual.   Bad breath.  DIAGNOSIS  A physical examination and testing will be needed to see if aspiration pneumonia is present. This can include:  A review of the above symptoms.   Chest X-ray. This is a picture of the lungs.   Listening to your lungs with a stethoscope. Your healthcare provider will listen for:   Crackling sounds in the lungs.   Decreased breath sounds.   A rapid heartbeat.   Swallowing study. This test looks at how food is swallowed and whether it goes into your breathing tube (trachea) or food pipe (esophagus).   Sputum culture. Sputum (saliva and mucus) is collected from the lungs or bronchi (tubes that carry air to the lungs). It is then tested for  bacteria.   Computed tomography. This is called a CT scan. It also can show lung damage.   Bronchoscopy. This test uses a flexible tube (bronchoscope) to see inside the lungs.  TREATMENT  Treatment will depend on how severe the aspiration pneumonia is and what led to it.  Some people may need to be treated in the hospital. Your breathing will be carefully monitored. Depending on how well you are breathing, you may:   Be able to breath on your own but need oxygen.   Need breathing support via a breathing machine (ventilator).   Medication:   Antibiotics or anti-fungal drugs might be prescribed. The particular medicine will depend on what caused the infection.   Other drugs may be given to reduce fever and/or pain.   Other treatments or corrections may be needed. For example:   Following a recommended diet. This is especially important if the swallowing study was failed.   Revising  medications.   Fixing dental problems.   Correcting breathing obstructions.   Treating stomach disorders.   Dealing with alcohol issues.   Having a feeding tube placed in the stomach.  HOME CARE INSTRUCTIONS   Take any medicines that were prescribed. Follow the directions carefully.   Check with your caregiver before taking over-the-counter medications.   Rest as instructed by your caregiver.   Keep all follow-up appointments with your healthcare provider. This is important so the caregiver can make sure the pneumonia is gone.  SEEK MEDICAL CARE IF:  Any of these symptoms return:  Shortness of breath or difficulty breathing.   Wheezing.   Fever of more than 100.5 F (38.1 C) or as recommended by your caregiver.   Chest pain.  MAKE SURE YOU:   Understand these instructions.   Will watch your condition.   Will get help right away if you are not doing well or get worse.  Document Released: 07/08/2009 Document Revised: 08/30/2011 Document Reviewed: 07/08/2009 [facility] Patient Information 2012 [place], [place].Diarrhea Diarrhea is watery poop (stool). The most common cause of diarrhea is a germ. Other causes include:  Food poisoning.   A reaction to medicine.  HOME CARE   Drink clear fluids. This can stop you from losing too much body fluid (dehydration).   Drink enough fluids to keep your pee (urine) clear or pale yellow.   Avoid solid foods and dairy products until you start to feel better. Then start eating bland foods, such as:   Bananas.   Rice.   Crackers.   Applesauce.   Dry toast.   Avoid spicy foods, caffeine, and alcohol.   Your doctor may give medicine to help with cramps and watery poop. Take this as told. Avoid these medicines if you have a fever or blood in your poop.   Take your medicine as told. Finish them even if you start to feel better.  GET HELP RIGHT AWAY IF:   The watery poop lasts longer than 3 days.   You have a fever.    Your baby is older than 3 months with a rectal temperature of 100.5 F (38.1 C) or higher for more than 1 day.   There is blood in your poop.   You start to throw up (vomit).   You lose too much fluid.  MAKE SURE YOU:   Understand these instructions.   Will watch your condition.   Will get help right away if you are not doing well or get worse.  Document Released: 02/27/2008 Document Revised: 08/30/2011  Document Reviewed: 02/27/2008 [facility] Patient Information 2012 [place], [place].Diet for Diarrhea, Adult Having frequent, runny stools (diarrhea) has many causes. Diarrhea may be caused or worsened by food or drink. Diarrhea may be relieved by changing your diet. IF YOU ARE NOT TOLERATING SOLID FOODS:  Drink enough water and fluids to keep your urine clear or pale yellow.   Avoid sugary drinks and sodas as well as milk-based beverages.   Avoid beverages containing caffeine and alcohol.   You may try rehydrating beverages. You can make your own by following this recipe:    tsp table salt.    tsp baking soda.   ? tsp salt substitute (potassium chloride).   1 tbs + 1 tsp sugar.   1 qt water.  As your stools become more solid, you can start eating solid foods. Add foods one at a time. If a certain food causes your diarrhea to get worse, avoid that food and try other foods. A low fiber, low-fat, and lactose-free diet is recommended. Small, frequent meals may be better tolerated.  Starches  Allowed:  White, [place], and pita breads, plain rolls, buns, bagels. Plain muffins, matzo. Soda, saltine, or graham crackers. Pretzels, melba toast, zwieback. Cooked cereals made with water: cornmeal, farina, cream cereals. Dry cereals: refined corn, wheat, rice. Potatoes prepared any way without skins, refined macaroni, spaghetti, noodles, refined rice.   Avoid:  Bread, rolls, or crackers made with whole wheat, multi-grains, rye, bran seeds, nuts, or coconut. Corn tortillas or taco  shells. Cereals containing whole grains, multi-grains, bran, coconut, nuts, or raisins. Cooked or dry oatmeal. Coarse wheat cereals, granola. Cereals advertised as "high-fiber." Potato skins. Whole grain pasta, wild or brown rice. Popcorn. Sweet potatoes/yams. Sweet rolls, doughnuts, waffles, pancakes, sweet breads.  Vegetables  Allowed: Strained tomato and vegetable juices. Most well-cooked and canned vegetables without seeds. Fresh: Tender lettuce, cucumber without the skin, cabbage, spinach, bean sprouts.   Avoid: Fresh, cooked, or canned: Artichokes, baked beans, beet greens, broccoli, Brussels sprouts, corn, kale, legumes, peas, sweet potatoes. Cooked: Green or red cabbage, spinach. Avoid large servings of any vegetables, because vegetables shrink when cooked, and they contain more fiber per serving than fresh vegetables.  Fruit  Allowed: All fruit juices except prune juice. Cooked or canned: Apricots, applesauce, cantaloupe, cherries, fruit cocktail, grapefruit, grapes, kiwi, mandarin oranges, peaches, pears, plums, watermelon. Fresh: Apples without skin, ripe banana, grapes, cantaloupe, cherries, grapefruit, peaches, oranges, plums. Keep servings limited to  cup or 1 piece.   Avoid: Fresh: Apple with skin, apricots, mango, pears, raspberries, strawberries. Prune juice, stewed or dried prunes. Dried fruits, raisins, dates. Large servings of all fresh fruits.  Meat and Meat Substitutes  Allowed: Ground or well-cooked tender beef, ham, veal, lamb, pork, or poultry. Eggs, plain cheese. Fish, oysters, shrimp, lobster, other seafoods. Liver, organ meats.   Avoid: Tough, fibrous meats with gristle. Peanut butter, smooth or chunky. Cheese, nuts, seeds, legumes, dried peas, beans, lentils.  Milk  Allowed: Yogurt, lactose-free milk, kefir, drinkable yogurt, buttermilk, soy milk.   Avoid: Milk, chocolate milk, beverages made with milk, such as milk shakes.  Soups  Allowed: Bouillon, broth, or  soups made from allowed foods. Any strained soup.   Avoid: Soups made from vegetables that are not allowed, cream or milk-based soups.  Desserts and Sweets  Allowed: Sugar-free gelatin, sugar-free frozen ice pops made without sugar alcohol.   Avoid: Plain cakes and cookies, pie made with allowed fruit, pudding, custard, cream pie. Gelatin, fruit, ice, sherbet, frozen ice pops.  Ice cream, ice milk without nuts. Plain hard candy, honey, jelly, molasses, syrup, sugar, chocolate syrup, gumdrops, marshmallows.  Fats and Oils  Allowed: Avoid any fats and oils.   Avoid: Seeds, nuts, olives, avocados. Margarine, butter, cream, mayonnaise, salad oils, plain salad dressings made from allowed foods. Plain gravy, crisp bacon without rind.  Beverages  Allowed: Water, decaffeinated teas, oral rehydration solutions, sugar-free beverages.   Avoid: Fruit juices, caffeinated beverages (coffee, tea, soda or pop), alcohol, sports drinks, or lemon-lime soda or pop.  Condiments  Allowed: Ketchup, mustard, horseradish, vinegar, cream sauce, cheese sauce, cocoa powder. Spices in moderation: allspice, basil, bay leaves, celery powder or leaves, cinnamon, cumin powder, curry powder, ginger, mace, marjoram, onion or garlic powder, oregano, paprika, parsley flakes, ground pepper, rosemary, sage, savory, tarragon, thyme, turmeric.   Avoid: Coconut, honey.  Weight Monitoring: Weigh yourself every day. You should weigh yourself in the morning after you urinate and before you eat breakfast. Wear the same amount of clothing when you weigh yourself. Record your weight daily. Bring your recorded weights to your clinic visits. Tell your caregiver right away if you have gained 3 lb/1.4 kg or more in 1 day, 5 lb/2.3 kg in a week, or whatever amount you were told to report. SEEK IMMEDIATE MEDICAL CARE IF:   You are unable to keep fluids down.   You start to throw up (vomit) or diarrhea keeps coming back (persistent).    Abdominal pain develops, increases, or can be felt in one place (localizes).   You have an oral temperature above 102 F (38.9 C), not controlled by medicine.   Diarrhea contains blood or mucus.   You develop excessive weakness, dizziness, fainting, or extreme thirst.  MAKE SURE YOU:   Understand these instructions.   Will watch your condition.   Will get help right away if you are not doing well or get worse.  Document Released: 12/01/2003 Document Revised: 08/30/2011 Document Reviewed: 03/24/2009 [facility] Patient Information 2012 [place], [place]..Diet for Diarrhea, Adult Having frequent, runny stools (diarrhea) has many causes. Diarrhea may be caused or worsened by food or drink. Diarrhea may be relieved by changing your diet. IF YOU ARE NOT TOLERATING SOLID FOODS:  Drink enough water and fluids to keep your urine clear or pale yellow.   Avoid sugary drinks and sodas as well as milk-based beverages.   Avoid beverages containing caffeine and alcohol.   You may try rehydrating beverages. You can make your own by following this recipe:    tsp table salt.    tsp baking soda.   ? tsp salt substitute (potassium chloride).   1 tbs + 1 tsp sugar.   1 qt water.  As your stools become more solid, you can start eating solid foods. Add foods one at a time. If a certain food causes your diarrhea to get worse, avoid that food and try other foods. A low fiber, low-fat, and lactose-free diet is recommended. Small, frequent meals may be better tolerated.  Starches  Allowed:  White, [place], and pita breads, plain rolls, buns, bagels. Plain muffins, matzo. Soda, saltine, or graham crackers. Pretzels, melba toast, zwieback. Cooked cereals made with water: cornmeal, farina, cream cereals. Dry cereals: refined corn, wheat, rice. Potatoes prepared any way without skins, refined macaroni, spaghetti, noodles, refined rice.   Avoid:  Bread, rolls, or crackers made with whole wheat,  multi-grains, rye, bran seeds, nuts, or coconut. Corn tortillas or taco shells. Cereals containing whole grains, multi-grains, bran, coconut, nuts, or raisins. Cooked  or dry oatmeal. Coarse wheat cereals, granola. Cereals advertised as "high-fiber." Potato skins. Whole grain pasta, wild or brown rice. Popcorn. Sweet potatoes/yams. Sweet rolls, doughnuts, waffles, pancakes, sweet breads.  Vegetables  Allowed: Strained tomato and vegetable juices. Most well-cooked and canned vegetables without seeds. Fresh: Tender lettuce, cucumber without the skin, cabbage, spinach, bean sprouts.   Avoid: Fresh, cooked, or canned: Artichokes, baked beans, beet greens, broccoli, Brussels sprouts, corn, kale, legumes, peas, sweet potatoes. Cooked: Green or red cabbage, spinach. Avoid large servings of any vegetables, because vegetables shrink when cooked, and they contain more fiber per serving than fresh vegetables.  Fruit  Allowed: All fruit juices except prune juice. Cooked or canned: Apricots, applesauce, cantaloupe, cherries, fruit cocktail, grapefruit, grapes, kiwi, mandarin oranges, peaches, pears, plums, watermelon. Fresh: Apples without skin, ripe banana, grapes, cantaloupe, cherries, grapefruit, peaches, oranges, plums. Keep servings limited to  cup or 1 piece.   Avoid: Fresh: Apple with skin, apricots, mango, pears, raspberries, strawberries. Prune juice, stewed or dried prunes. Dried fruits, raisins, dates. Large servings of all fresh fruits.  Meat and Meat Substitutes  Allowed: Ground or well-cooked tender beef, ham, veal, lamb, pork, or poultry. Eggs, plain cheese. Fish, oysters, shrimp, lobster, other seafoods. Liver, organ meats.   Avoid: Tough, fibrous meats with gristle. Peanut butter, smooth or chunky. Cheese, nuts, seeds, legumes, dried peas, beans, lentils.  Milk  Allowed: Yogurt, lactose-free milk, kefir, drinkable yogurt, buttermilk, soy milk.   Avoid: Milk, chocolate milk, beverages made  with milk, such as milk shakes.  Soups  Allowed: Bouillon, broth, or soups made from allowed foods. Any strained soup.   Avoid: Soups made from vegetables that are not allowed, cream or milk-based soups.  Desserts and Sweets  Allowed: Sugar-free gelatin, sugar-free frozen ice pops made without sugar alcohol.   Avoid: Plain cakes and cookies, pie made with allowed fruit, pudding, custard, cream pie. Gelatin, fruit, ice, sherbet, frozen ice pops. Ice cream, ice milk without nuts. Plain hard candy, honey, jelly, molasses, syrup, sugar, chocolate syrup, gumdrops, marshmallows.  Fats and Oils  Allowed: Avoid any fats and oils.   Avoid: Seeds, nuts, olives, avocados. Margarine, butter, cream, mayonnaise, salad oils, plain salad dressings made from allowed foods. Plain gravy, crisp bacon without rind.  Beverages  Allowed: Water, decaffeinated teas, oral rehydration solutions, sugar-free beverages.   Avoid: Fruit juices, caffeinated beverages (coffee, tea, soda or pop), alcohol, sports drinks, or lemon-lime soda or pop.  Condiments  Allowed: Ketchup, mustard, horseradish, vinegar, cream sauce, cheese sauce, cocoa powder. Spices in moderation: allspice, basil, bay leaves, celery powder or leaves, cinnamon, cumin powder, curry powder, ginger, mace, marjoram, onion or garlic powder, oregano, paprika, parsley flakes, ground pepper, rosemary, sage, savory, tarragon, thyme, turmeric.   Avoid: Coconut, honey.  Weight Monitoring: Weigh yourself every day. You should weigh yourself in the morning after you urinate and before you eat breakfast. Wear the same amount of clothing when you weigh yourself. Record your weight daily. Bring your recorded weights to your clinic visits. Tell your caregiver right away if you have gained 3 lb/1.4 kg or more in 1 day, 5 lb/2.3 kg in a week, or whatever amount you were told to report. SEEK IMMEDIATE MEDICAL CARE IF:   You are unable to keep fluids down.   You start  to throw up (vomit) or diarrhea keeps coming back (persistent).   Abdominal pain develops, increases, or can be felt in one place (localizes).   You have an oral temperature above 102 F (  38.9 C), not controlled by medicine.   Diarrhea contains blood or mucus.   You develop excessive weakness, dizziness, fainting, or extreme thirst.  MAKE SURE YOU:   Understand these instructions.   Will watch your condition.   Will get help right away if you are not doing well or get worse.  Document Released: 12/01/2003 Document Revised: 08/30/2011 Document Reviewed: 03/24/2009 [facility] Patient Information 2012 [place], [place].

## 2012-02-24 NOTE — Progress Notes (Signed)
Patient ID: Erin West, female   DOB: 05/25/1951, 61 y.o.   MRN: [id number]  Subjective: No events overnight. Patient denies chest pain, shortness of breath, abdominal pain. Pt reports feeling better today and has better appetite.  Objective: Vital signs in last 24 hours:  02/23/12 1355 02/23/12 1530 02/23/12 2218 02/24/12 0530  BP: 111/80 108/70 111/74 121/81  Pulse: 78  88 79  Temp: 98.4 F (36.9 C)  98.1 F (36.7 C) 97.6 F (36.4 C)  SpO2: 95%  91% 95%   Intake/Output from previous day:  Gross per 24 hour  Intake 1058.75 ml  Output    725 ml  Net 333.75 ml   Physical Exam: General: Alert, awake, oriented x3, in no acute distress. HEENT: No bruits, no goiter. Moist mucous membranes, no scleral icterus, no conjunctival pallor. Heart: Regular rate and rhythm, S1/S2 +, no murmurs, rubs, gallops. Lungs: Clear to auscultation bilaterally. No wheezing, no rhonchi, no rales.  Abdomen: Soft, nontender, nondistended, positive bowel sounds. Extremities: No clubbing or cyanosis, no pitting edema,  positive pedal pulses. Left > right knee swelling Neuro: Grossly nonfocal.  Lab Results: Lab 02/24/12 0538 02/23/12 0530 02/22/12 1730  WBC 5.3 7.0 7.6  HGB 12.3 12.4 14.9  HCT 36.4 36.6 42.5  PLT 178 192 197   La 02/24/12 0538 02/23/12 0530 02/22/12 1730  NA 139 139 139  K 3.7 3.7 3.7  CL 109 107 103  CO2 23 24 24   GLUCOSE 95 91 89  BUN 12 10 11   CREATININE 0.76 0.81 0.73  CALCIUM 8.5 8.6 9.2   Cardiac markers:  Lab 02/22/12 2217  CKMB 1.9  TROPONINI <0.30  MYOGLOBIN --   Studies/Results:  Ct Chest W Contrast 02/22/2012    IMPRESSION:  Patchy opacities in the left lung, most pronounced in the left upper lobe/lingula compatible with pneumonia as seen on prior chest x-ray.    Ct Abdomen Pelvis W Contrast 02/24/2012    IMPRESSION:   1.  No acute findings identified within the abdomen or the pelvis.   Dg Abd Acute W/chest 02/22/2012    IMPRESSION:   1.  Parenchymal  opacity at the left lung base suspicious for pneumonia.  Recommend two-view chest x-ray.  2.  No bowel obstruction.  No free air.     Medications: Scheduled Meds:   . amLODipine  5 mg Oral Daily  . carvedilol  6.25 mg Oral BID WC  . enoxaparin  40 mg Subcutaneous QHS  . levofloxacin  750 mg Oral Daily  . losartan  100 mg Oral Daily  . pantoprazole  40 mg Oral Daily  . simvastatin  20 mg Oral Daily  . sodium chloride  3 mL Intravenous Q12H   Continuous Infusions:   . sodium chloride 75 mL/hr (02/24/12 1255)   PRN Meds:.acetaminophen, bismuth subsalicylate, diphenoxylate-atropine, iohexol, iohexol, LORazepam, ondansetron (ZOFRAN) IV, ondansetron  Assessment/Plan:  Principal Problem:  *Aspiration pneumonia  - pt clinically improving  - will continue antibiotic as noted above  - continue to monitor vitals per floor protocol   Active Problems:  Hypertension  - well controlled to this point  - will monitor on telemetry   GERD (gastroesophageal reflux disease)  - clinically stable  - will continue Protonix   Diarrhea  - pt denies diarrhea over 24 hour period  - will check stool for O&P if recurs  - will d/c imodium - CT abdomen and pelvis are negative for acute findings  Syncope  - unclear etiology and  perhaps secondary to dehydration  - no event on telemetry overnight  - PT evaluation   EDUCATION  - test results and diagnostic studies were discussed with patient  - patient verbalized the understanding  - questions were answered at the bedside and contact information was provided for additional questions or concerns  - plan to d/c home today or latest tomorrow AM   LOS: 2 days   MAGICK-Lesley Galentine 02/24/2012, 2:31 PM  [facility] Pager: [phone]

## 2012-02-25 DIAGNOSIS — R197 Diarrhea, unspecified: Secondary | ICD-10-CM

## 2012-02-25 DIAGNOSIS — D696 Thrombocytopenia, unspecified: Secondary | ICD-10-CM

## 2012-02-25 DIAGNOSIS — R55 Syncope and collapse: Secondary | ICD-10-CM

## 2012-02-25 MED ORDER — LORAZEPAM 0.5 MG PO TABS: 0.5000 mg | ORAL_TABLET | Freq: Two times a day (BID) | ORAL | Status: AC | PRN

## 2012-02-25 MED ORDER — LOPERAMIDE HCL 2 MG PO TABS: 2.0000 mg | ORAL_TABLET | Freq: Three times a day (TID) | ORAL | Status: AC | PRN

## 2012-02-25 NOTE — Progress Notes (Signed)
Physical Therapy Discharge Patient Details Name: Erin West MRN: [id number] DOB: 11/03/1950 Today's Date: 02/25/2012 Time: 4098-1191 PT Time Calculation (min): 7 min  Patient discharged from PT services secondary to goals met and no further PT needs identified.  Performed screen and had pt ambulate down hallway with no AD at Supervision/Mod I level.  Will not require any further PT.    Please see latest therapy progress note for current level of functioning and progress toward goals.    Progress and discharge plan discussed with patient and/or caregiver: Patient/Caregiver agrees with plan  Page, Mer[facility] [phone] 02/25/2012, 10:26 AM

## 2012-02-25 NOTE — Care Management Note (Signed)
    Page 1 of 1   02/25/2012     9:48:19 AM   CARE MANAGEMENT NOTE 02/25/2012  Patient:  Erin West, Erin West   Account Number:  [id number]  Date Initiated:  02/25/2012  Documentation initiated by:  Lanier Clam  Subjective/Objective Assessment:   ADMITTED W/CHILLS,FEVER.     Action/Plan:   FROM HOME   Anticipated DC Date:  02/25/2012   Anticipated DC Plan:  HOME/SELF CARE      DC Planning Services  CM consult      Choice offered to / List presented to:             Status of service:  Completed, signed off Medicare Important Message given?   (If response is "NO", the following Medicare IM given date fields will be blank) Date Medicare IM given:   Date Additional Medicare IM given:    Discharge Disposition:  HOME/SELF CARE  Per UR Regulation:  Reviewed for med. necessity/level of care/duration of stay  If discussed at Long Length of Stay Meetings, dates discussed:    Comments:

## 2012-02-25 NOTE — Discharge Summary (Signed)
Physician Discharge Summary  Patient ID: Erin West MRN: [id number] DOB/AGE: 1951/04/26 61 y.o.  Admit date: 02/22/2012 Discharge date: 02/25/2012  Primary Care Physician:  Gwynneth Aliment, MD, MD   Discharge Diagnoses:  Aspiration PNA  Principal Problem:  *Aspiration pneumonia Active Problems:  Hypertension  GERD (gastroesophageal reflux disease)  Diarrhea  Syncope   Medication List  As of 02/25/2012  9:58 AM   STOP taking these medications         bismuth subsalicylate 262 MG/15ML suspension      ciprofloxacin 500 MG tablet      diphenoxylate-atropine 2.5-0.025 MG per tablet         TAKE these medications         amLODipine 5 MG tablet   Commonly known as: NORVASC   Take 5 mg by mouth daily.      atorvastatin 20 MG tablet   Commonly known as: LIPITOR   Take 20 mg by mouth daily.      carvedilol 6.25 MG tablet   Commonly known as: COREG   Take 6.25 mg by mouth 2 (two) times daily with a meal.      esomeprazole 40 MG capsule   Commonly known as: NEXIUM   Take 40 mg by mouth daily before breakfast.      levofloxacin 750 MG tablet   Commonly known as: LEVAQUIN   Take 1 tablet (750 mg total) by mouth daily.      loperamide 2 MG tablet   Commonly known as: IMODIUM A-D   Take 1 tablet (2 mg total) by mouth 3 (three) times daily as needed for diarrhea or loose stools.      LORazepam 0.5 MG tablet   Commonly known as: ATIVAN   Take 1 tablet (0.5 mg total) by mouth every 12 (twelve) hours as needed for anxiety.      losartan 100 MG tablet   Commonly known as: COZAAR   Take 100 mg by mouth daily.      naproxen sodium 220 MG tablet   Commonly known as: ANAPROX   Take 220 mg by mouth 2 (two) times daily with a meal.      ondansetron 8 MG disintegrating tablet   Commonly known as: ZOFRAN-ODT   Take 1 tablet (8 mg total) by mouth every 8 (eight) hours as needed for nausea.             Disposition and Follow-up: Pt medically stable and ready for  discharge to home. Has follow up with Dr. Loreta Ave 02/26/12.   Consults:  Curb side discussion with GI Dr. Loreta Ave. Given CT results and clinical picture, can follow up out patient. Has appointment 02/26/12.  Physical Exam: Vital signs in last 24 hours:  02/24/12 0530 02/24/12 1432 02/24/12 2101 02/25/12 0611  BP: 121/81 127/82 102/70 126/84  Pulse: 79 90 85 76  Temp: 97.6 F (36.4 C) 98.1 F (36.7 C) 97.8 F (36.6 C) 98 F (36.7 C)  SpO2: 95% 98% 96% 97%   Intake/Output from previous day:  Gross per 24 hour  Intake    840 ml  Output    275 ml  Net    565 ml   Physical Exam: General: Alert, awake, oriented x3, in no acute distress. HEENT: No bruits, no goiter. Moist mucous membranes, no scleral icterus, no conjunctival pallor. Heart: Regular rate and rhythm, S1/S2 +, no murmurs, rubs, gallops. Lungs: Clear to auscultation bilaterally. No wheezing, no rhonchi, no rales.  Abdomen: Soft, nontender, nondistended,  positive bowel sounds. Extremities: No clubbing or cyanosis, no pitting edema,  positive pedal pulses. Neuro: Grossly nonfocal.  Lab Results Lab 02/24/12 0538 02/23/12 0530 02/22/12 1730  WBC 5.3 7.0 7.6  HGB 12.3 12.4 14.9  HCT 36.4 36.6 42.5  PLT 178 192 197   Lab 02/24/12 0538 02/23/12 0530 02/22/12 1730  NA 139 139 139  K 3.7 3.7 3.7  CL 109 107 103  CO2 23 24 24   GLUCOSE 95 91 89  BUN 12 10 11   CREATININE 0.76 0.81 0.73  CALCIUM 8.5 8.6 9.2   Cardiac markers:  Lab 02/22/12 2217  CKMB 1.9  TROPONINI <0.30  MYOGLOBIN --   Studies/Results:  Ct Abdomen Pelvis W Contrast 02/24/2012   IMPRESSION:   1.  No acute findings identified within the abdomen or the pelvis.    Ct Chest W Contrast 02/22/2012   IMPRESSION:  Patchy opacities in the left lung, most pronounced in the left upper lobe/lingula compatible with pneumonia as seen on prior chest x-ray.    Dg Abd Acute W/chest 02/22/2012    IMPRESSION:   1.  Parenchymal opacity at the left lung base suspicious for  pneumonia.  Recommend two-view chest x-ray.  2.  No bowel obstruction.  No free air.   Brief H and P: For complete details please refer to admission H and P, but in brief  61 year old woman without any significant past medical problems, presented to the [facility] emergency room on 5/31 with sudden onset of fever chills associated with some nausea and feeling of passing out. She had diarrhea for one week associated with some chills. On the day of admission she  had an upper endoscopy to evaluate for possible celiac disease. She was evaluated at [facility] on May 26 and was started on empiric antibiotics with ciprofloxacin for possible gastroenteritis. She had been taking her blood pressure medications as prescribed. She was evaluated with a CT angiogram of the chest in the emergency room and found to have infiltrates suggestive of pneumonia. She was admitted to medicine service.   Hospital Course:   Aspiration pneumonia  - Pt. Admitted to tele. CT chest as above. Pt started on IV antibiotics. Urine legionella antigen negative.  - At time of discharge pt clinically improving. No white count, afebrile.   - antibiotic transitioned to po 02/24/12.    Active Problems:  Hypertension  -Stable during hospitalization. At discharge  well controlled on home regimen   GERD (gastroesophageal reflux disease)  - clinically stable  - will continue Protonix   Diarrhea  - Pt initially several episodes of diarrhea. Four episodes documented last 24 hours.   - CT abdomen and pelvis are negative for acute findings. - Case discussed with Dr. Loreta Ave. Pt has appointment 02/26/12 for OP work up.   Syncope  - unclear etiology and perhaps secondary to dehydration.  - No episodes during hospitalization. No orthostatic hypotension. No event on telemetry.   Time spent on Discharge: 30 minutes  MAGICK-Jania Steinke  [facility], pager #: [phone] Main office  number: [phone]

## 2012-02-27 LAB — STOOL CULTURE: Special Requests: NORMAL

## 2012-04-10 ENCOUNTER — Telehealth (HOSPITAL_COMMUNITY): Payer: Self-pay | Admitting: *Deleted

## 2012-04-10 NOTE — ED Notes (Signed)
Pt. called on VM @ 1118  and asked if we give steroid shots in the back here. Her pain management doctor is out of town and none of the other doctors in the office do it.  I called back and told her we do not do that and suggested she try [facility]. She said she has [employer] systems and the orthopedists in town don't take it, that is why she goes to Dr. Christell Constant in [facility]. I told her I was sorry but we don't do it here. Vassie Moselle 04/10/2012

## 2012-06-07 NOTE — ED Provider Notes (Signed)
Medical screening examination/treatment/procedure(s) were performed by non-physician practitioner and as supervising physician I was immediately available for consultation/collaboration.  Toy Baker, MD 06/07/12 [phone]

## 2012-08-14 ENCOUNTER — Other Ambulatory Visit (HOSPITAL_COMMUNITY): Payer: Self-pay | Admitting: Internal Medicine

## 2012-08-14 DIAGNOSIS — Z1231 Encounter for screening mammogram for malignant neoplasm of breast: Secondary | ICD-10-CM

## 2012-09-04 ENCOUNTER — Ambulatory Visit (HOSPITAL_COMMUNITY)

## 2012-12-19 ENCOUNTER — Ambulatory Visit (INDEPENDENT_AMBULATORY_CARE_PROVIDER_SITE_OTHER): Admitting: Podiatry

## 2012-12-19 ENCOUNTER — Encounter: Payer: Self-pay | Admitting: Podiatry

## 2012-12-19 VITALS — BP 144/89 | HR 71 | Ht 63.0 in | Wt 174.0 lb

## 2012-12-19 DIAGNOSIS — M25579 Pain in unspecified ankle and joints of unspecified foot: Secondary | ICD-10-CM

## 2012-12-19 DIAGNOSIS — L600 Ingrowing nail: Secondary | ICD-10-CM

## 2012-12-19 DIAGNOSIS — M25572 Pain in left ankle and joints of left foot: Secondary | ICD-10-CM

## 2012-12-19 NOTE — Patient Instructions (Addendum)
Seen for ingrown nail 1 bilateral. Done ingrown nail surgery; Partial matrixectomy left great toe medial border with Phenol and Alcohol. Please follow hand out instruction and soak daily. Call the office if excess pain, swelling or redness occur.  Some clear drainage is expected.  Return in one week.

## 2012-12-19 NOTE — Progress Notes (Signed)
Pain on two big toes x sore for 3-4 weeks. Tried to cut, too sore to get deep and work on it L>R. Thinks shoe has ingrown nails.  Subjective: 62 y.o. year old female patient presents complaining of painful nails x 3-4 weeks. She tried to cut them out, but nails were too deep and was not able to get any relief.    Review of Systems - General ROS: negative for - chills, fatigue, fever, hot flashes, malaise, night sweats, sleep disturbance, weight gain or weight loss  Objective: Dermatologic: Thick yellow deformed nails both great toe at medial border without infection or inflammation. They are sore to light pressure.  Vascular: Pedal pulses are all palpable. Orthopedic: Hallux valgus with bunion R>L.  Neurologic: All epicritic and tactile sensations grossly intact with exception of distal end of left great toe.  Monofilament Sensory testing reveal intact protective sensory perception on forefoot bilateral..  Numbness on distal medial end of left great toe from a spur in lower back.   Assessment: Symptomatic Onychocryptosis both great toes medial border L>R.  Treatment: Office Procedure: Phenol and Alcohol Matrixectomy medial border left hallux performed under local anesthetic. Local anesthetics used: Total 5cc of 50/50 mixture 1% Xylocaine plain and 0.5% Marcaine with epinephrine. Written and verbal home care instructions with supply dispensed. Return in 1 week for follow up.

## 2012-12-26 ENCOUNTER — Encounter: Payer: Self-pay | Admitting: Podiatry

## 2012-12-26 ENCOUNTER — Ambulatory Visit (INDEPENDENT_AMBULATORY_CARE_PROVIDER_SITE_OTHER): Admitting: Podiatry

## 2012-12-26 VITALS — BP 142/80 | HR 75 | Ht 63.0 in | Wt 172.0 lb

## 2012-12-26 DIAGNOSIS — L600 Ingrowing nail: Secondary | ICD-10-CM | POA: Insufficient documentation

## 2012-12-26 NOTE — Progress Notes (Signed)
Subjective: Status post one week since partial nail matrixectomy left hallux medial border. Stated that the area is still sore.  Objective: Still has tenderness with minimum drainage.  Noted minimum erythema at surgical site left hallux medial border. Assessment: Normal wound healing without complication following Phenol and Alcohol matrixectomy left hallux.  Plan:  Continue to soak daily till the tenderness subside.  May use special porous tape while doing pool exercise. Home care instruction and supply dispensed. Return as needed.

## 2012-12-26 NOTE — Patient Instructions (Addendum)
Post nail surgery left great toe. Wound is healing as expected. Ok to go in pool with toe wrap. Keep the area dry as much as possible. Soak till pain and tenderness subside. Return if more pain, swelling, redness, drainage occur.

## 2013-02-18 ENCOUNTER — Ambulatory Visit (INDEPENDENT_AMBULATORY_CARE_PROVIDER_SITE_OTHER): Admitting: Podiatry

## 2013-02-18 VITALS — BP 125/86 | HR 91

## 2013-02-18 DIAGNOSIS — B351 Tinea unguium: Secondary | ICD-10-CM

## 2013-02-18 DIAGNOSIS — L600 Ingrowing nail: Secondary | ICD-10-CM

## 2013-02-18 DIAGNOSIS — M25572 Pain in left ankle and joints of left foot: Secondary | ICD-10-CM

## 2013-02-18 DIAGNOSIS — M25579 Pain in unspecified ankle and joints of unspecified foot: Secondary | ICD-10-CM

## 2013-02-18 MED ORDER — CICLOPIROX 8 % EX SOLN: Freq: Every day | CUTANEOUS | Status: DC

## 2013-02-18 NOTE — Progress Notes (Signed)
Subjective: 62 Y/O female presents complaining of pain on medial border of left great toe. She has had an ingrown nail surgery on this site. Objective: Hard callus build up at the distal medial corner with thick ingrown distal medial end of nail end. Loose part of nail at the area was seen.  Thick callus build up at the medial nail border of right hallux. Both great toe nails are thick and ingrown at the distal end of nail groove. Assessment: Ingrown nail with callus build up at the nail border right > left. Surgical site of left hallucal nail medial border is clean and normal appearance without sign of infection or inflammation. Mycotic nails bilateral both great toes. Lesser digits were not visible under nail polish.   Plan: Debrided hard callus and ingrown nail part. Rx. Penlac.  Follow up in 2 months.

## 2013-02-25 ENCOUNTER — Ambulatory Visit (INDEPENDENT_AMBULATORY_CARE_PROVIDER_SITE_OTHER): Admitting: Obstetrics & Gynecology

## 2013-02-25 ENCOUNTER — Encounter: Payer: Self-pay | Admitting: Obstetrics & Gynecology

## 2013-02-25 VITALS — BP 144/89 | HR 81 | Temp 98.1°F | Ht 64.0 in | Wt 178.0 lb

## 2013-02-25 DIAGNOSIS — N390 Urinary tract infection, site not specified: Secondary | ICD-10-CM

## 2013-02-25 DIAGNOSIS — N949 Unspecified condition associated with female genital organs and menstrual cycle: Secondary | ICD-10-CM

## 2013-02-25 DIAGNOSIS — R102 Pelvic and perineal pain: Secondary | ICD-10-CM | POA: Insufficient documentation

## 2013-02-25 DIAGNOSIS — N760 Acute vaginitis: Secondary | ICD-10-CM

## 2013-02-25 LAB — POCT URINALYSIS DIPSTICK
Bilirubin, UA: NEGATIVE
Blood, UA: NEGATIVE
Glucose, UA: NEGATIVE
Ketones, UA: NEGATIVE
Nitrite, UA: NEGATIVE
Protein, UA: NEGATIVE
Spec Grav, UA: 1.015
Urobilinogen, UA: NEGATIVE
pH, UA: 7.5

## 2013-02-25 NOTE — Patient Instructions (Addendum)
Vulva Biopsy A vulva biopsy is a procedure where a small piece of tissue is taken from the vulva, or outside of the female genital area. The vulva includes the folds of skin (labia), the clitoris, and the openings of the urethra and vagina. This sample is taken to obtain more information or a diagnosis regarding a lesion, growth, rash, blister, or some abnormal discoloration. It can also be done to remove an unwanted mole or wart. LET YOUR CAREGIVER KNOW ABOUT:  Any allergies to food or medicine.  Medicines taken, including vitamins, herbs, eyedrops, and over-the-counter medicines and creams.  Use of steroids (by mouth or creams).  Previous problems with anesthetics or numbing medicines.  History of bleeding problems or blood clots.  Previous surgery.  Other health problems, including diabetes and kidney problems.  Possibility of pregnancy, if this applies. RISKS AND COMPLICATIONS Generally, vulva biopsy is a safe procedure. However, as with any surgical procedure, complications can occur. Possible complications include:  Bleeding from the biopsy site.  Infection.  Injury to organs or structures near the biopsy site.  Long-lasting (chronic) pain at the biopsy site. BEFORE THE PROCEDURE  Wear loose and comfortable pants and underwear to the hospital or clinic. This will lessen rubbing on the biopsy site once the procedure is finished.  Bring sanitary pads and panty liners to use after the procedure. PROCEDURE  The biopsy site will be cleaned with an antiseptic. You will be given an injection of an anesthetic medicine in the biopsy site using a needle. A small tissue sample will be removed (excised). This sample may be sent for further examination depending on why you are having a biopsy. A medicine may be applied to the biopsy site to help stop the bleeding. Finally, the biopsy site may be closed with dissolvable stitches (sutures).  AFTER THE PROCEDURE  You may be given pain  medicine to help with any discomfort.  You may notice a small amount of bleeding from the biopsy area. This is normal. Wear a sanitary pad.  Do not rub the biopsy area after urinating. Gently pat the area dry or use a bottle filled with warm water (peri-bottle) to clean the area.  Your sutures should dissolve within one week or may need to be removed. Document Released: 08/27/2012 Document Reviewed: 07/07/2012 [facility] Patient Information 2014 [place], [place]. Pelvic Pain Pelvic pain is pain below the belly button and located between your hips. Acute pain may last a few hours or days. Chronic pelvic pain may last weeks and months. The cause may be different for different types of pain. The pain may be dull or sharp, mild or severe and can interfere with your daily activities. Write down and tell your caregiver:   Exactly where the pain is located.  If it comes and goes or is there all the time.  When it happens (with sex, urination, bowel movement, etc.)  If the pain is related to your menstrual period or stress. Your caregiver will take a full history and do a complete physical exam and Pap test. CAUSES   Painful menstrual periods (dysmenorrhea).  Normal ovulation (Mittelschmertz) that occurs in the middle of the menstrual cycle every month.  The pelvic organs get engorged with blood just before the menstrual period (pelvic congestive syndrome).  Scar tissue from an infection or past surgery (pelvic adhesions).  Cancer of the female pelvic organs. When there is pain with cancer, it has been there for a long time.  The lining of the uterus (endometrium)  abnormally grows in places like the pelvis and on the pelvic organs (endometriosis).  A form of endometriosis with the lining of the uterus present inside of the muscle tissue of the uterus (adenomyosis).  Fibroid tumor (noncancerous) in the uterus.  Bladder problems such as infection, bladder spasms of the muscle tissue of the  bladder.  Intestinal problems (irritable bowel syndrome, colitis, an ulcer or gastrointestinal infection).  Polyps of the cervix or uterus.  Pregnancy in the tube (ectopic pregnancy).  The opening of the cervix is too small for the menstrual blood to flow through it (cervical stenosis).  Physical or sexual abuse (past or present).  Musculo-skeletal problems from poor posture, problems with the vertebrae of the lower back or the uterine pelvic muscles falling (prolapse).  Psychological problems such as depression or stress.  IUD (intrauterine device) in the uterus. DIAGNOSIS  Tests to make a diagnosis depends on the type, location, severity and what causes the pain to occur. Tests that may be needed include:  Blood tests.  Urine tests  Ultrasound.  X-rays.  CT Scan.  MRI.  Laparoscopy.  Major surgery. TREATMENT  Treatment will depend on the cause of the pain, which includes:  Prescription or over-the-counter pain medication.  Antibiotics.  Birth control pills.  Hormone treatment.  Nerve blocking injections.  Physical therapy.  Antidepressants.  Counseling with a psychiatrist or psychologist.  Minor or major surgery. HOME CARE INSTRUCTIONS   Only take over-the-counter or prescription medicines for pain, discomfort or fever as directed by your caregiver.  Follow your caregiver's advice to treat your pain.  Rest.  Avoid sexual intercourse if it causes the pain.  Apply warm or cold compresses (which ever works best) to the pain area.  Do relaxation exercises such as yoga or meditation.  Try acupuncture.  Avoid stressful situations.  Try group therapy.  If the pain is because of a stomach/intestinal upset, drink clear liquids, eat a bland light food diet until the symptoms go away. SEEK MEDICAL CARE IF:   You need stronger prescription pain medication.  You develop pain with sexual intercourse.  You have pain with urination.  You develop a  temperature of 102 F (38.9 C) with the pain.  You are still in pain after 4 hours of taking prescription medication for the pain.  You need depression medication.  Your IUD is causing pain and you want it removed. SEEK IMMEDIATE MEDICAL CARE IF:  You develop very severe pain or tenderness.  You faint, have chills, severe weakness or dehydration.  You develop heavy vaginal bleeding or passing solid tissue.  You develop a temperature of 102 F (38.9 C) with the pain.  You have blood in the urine.  You are being physically or sexually abused.  You have uncontrolled vomiting and diarrhea.  You are depressed and afraid of harming yourself or someone else. Document Released: 10/18/2004 Document Revised: 12/03/2011 Document Reviewed: 07/15/2008 [facility] Patient Information 2013 [place], [place].

## 2013-02-25 NOTE — Progress Notes (Signed)
Subjective:     Erin West is a 62 y.o. female here for a routine exam.  Current complaints: frequent urinary issues and vulvar irritation. Pain in lower RLQ.  Denies recent exposures to possible irritants, abnormal discharge.     Gynecologic History No LMP recorded. Patient is postmenopausal. Contraception: tubal ligation Last Pap: 08/2012. Results were: normal Last mammogram: 07/2012. Results were: normal  Obstetric History OB History   Grav Para Term Preterm Abortions TAB SAB Ect Mult Living                   The following portions of the patient's history were reviewed and updated as appropriate: allergies, current medications, past family history, past medical history, past social history, past surgical history and problem list.  Review of Systems Pertinent items are noted in HPI.    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

## 2013-02-28 LAB — CULTURE, URINE COMPREHENSIVE: Colony Count: 15000

## 2013-03-02 ENCOUNTER — Encounter: Payer: Self-pay | Admitting: Obstetrics & Gynecology

## 2013-03-03 ENCOUNTER — Telehealth: Payer: Self-pay | Admitting: *Deleted

## 2013-03-03 MED ORDER — LEVOFLOXACIN 250 MG PO TABS: 250.0000 mg | ORAL_TABLET | Freq: Every day | ORAL | Status: AC

## 2013-03-03 NOTE — Telephone Encounter (Signed)
Call place to patient at [phone] she has been advised per Dr Antionette Char. Rx sent to pharmacy for Levaquin 250 mg po QD x 3 days.

## 2013-03-04 ENCOUNTER — Other Ambulatory Visit

## 2013-03-06 ENCOUNTER — Other Ambulatory Visit: Payer: Self-pay | Admitting: *Deleted

## 2013-03-06 DIAGNOSIS — R109 Unspecified abdominal pain: Secondary | ICD-10-CM

## 2013-03-09 ENCOUNTER — Ambulatory Visit: Admitting: Obstetrics & Gynecology

## 2013-03-11 ENCOUNTER — Ambulatory Visit (INDEPENDENT_AMBULATORY_CARE_PROVIDER_SITE_OTHER)

## 2013-03-11 ENCOUNTER — Encounter: Payer: Self-pay | Admitting: Obstetrics & Gynecology

## 2013-03-11 ENCOUNTER — Other Ambulatory Visit: Payer: Self-pay

## 2013-03-11 ENCOUNTER — Ambulatory Visit (INDEPENDENT_AMBULATORY_CARE_PROVIDER_SITE_OTHER): Admitting: Obstetrics & Gynecology

## 2013-03-11 VITALS — BP 143/90 | HR 73 | Temp 98.2°F | Ht 63.5 in | Wt 179.2 lb

## 2013-03-11 DIAGNOSIS — N904 Leukoplakia of vulva: Secondary | ICD-10-CM

## 2013-03-11 DIAGNOSIS — R109 Unspecified abdominal pain: Secondary | ICD-10-CM

## 2013-03-11 DIAGNOSIS — N760 Acute vaginitis: Secondary | ICD-10-CM

## 2013-03-11 NOTE — Progress Notes (Signed)
.   Subjective:     Erin West is a 62 y.o. female here for her ultrasound results (done today) and a possible vulvar biopsy.  Current complaints: RLQ pain.  Personal health questionnaire reviewed: no.   Gynecologic History No LMP recorded. Patient is postmenopausal. Contraception: none   Obstetric History OB History   Grav Para Term Preterm Abortions TAB SAB Ect Mult Living   3 3        3      # Outc Date GA Lbr Len/2nd Wgt Sex Del Anes PTL Lv   1 PAR            2 PAR            3 PAR                The following portions of the patient's history were reviewed and updated as appropriate: allergies, current medications, past family history, past medical history, past social history, past surgical history and problem list.  Review of Systems Pertinent items are noted in HPI.    Objective:    The posterior fourchette was prepped w/Betadine.  The skin was infiltrated w/1% lidocaine.  A punch biopsy was taken.  Silver nitrate was applied.  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

## 2013-03-11 NOTE — Patient Instructions (Signed)
Patient information: Vulvar itching (The Basics)View in SpanishWritten by the doctors and editors at [employer]  What is vulvar itching? - Vulvar itching is itching in the area around the opening of the vagina (figure 1).  Women with vulvar itching sometimes have other symptoms. These can include:  ?Burning or stinging  ?A "raw" feeling, like the vulva was rubbed by something rough  ?Redness ?Vaginal discharge What causes vulvar itching? - Vulvar itching has many different causes. Some common causes include: ?Allergies  ?Skin irritation from soap, lotion, or another product that was on the vulva ?Infections in the vagina or vulva ?A skin disease called "lichen sclerosus" - This disease causes itching and skin changes.  ?Pubic lice, also called "crabs" - These are tiny insects that can live in the hair around the vulva. Should I see a doctor or nurse? - Yes. If you have vulvar itching, see a doctor or nurse so that he or she can figure out the cause. Your doctor or nurse will talk with you and do an exam.  Will I need tests? - Maybe. The doctor or nurse might be able to find the cause of the itching by talking with you and doing an exam. If this happens, you might not need any tests.  If you do need tests, they can include: ?Tests on a sample of fluid from the vagina - These tests can check for infection.  ?A "patch test" - In this test, a doctor puts small amounts of different substances on the skin of your back. Then, he or she checks to see if the substances cause itching.  ?A "biopsy" - In this test, a doctor takes a small sample of skin from the vulva. Another doctor looks at the sample under a microscope. You might have this test if other tests do not show the cause of your vulvar itching. But most women with vulvar itching do not need this test. How is vulvar itching treated? - If the itching is caused by another medical condition, such as an infection, treating the condition usually gets rid  of it. Or the doctor might tell you to stop using substances or products that can cause itching. Treatments include: ?Medicines to treat infection - These can be pills you take by mouth, gels or creams you put in the vagina, or shots.  ?Medicines to relieve itching - These can be ointments you put on the vulva or pills you take by mouth. If your itching is very bad at night, the doctor might give you doxepin (brand name: Silenor) or hydroxyzine (brand name: Vistaril). These medicines can relieve itching and help you sleep. If other treatments to relieve itching do not work, the doctor might give you a shot of medicine in the itchy area. He or she will put a numbing cream or gel on the vulva before giving the shot.  ?Medicines to get rid of pubic lice - These are lotions or creams you put on the skin.  ?"Sitz baths" - Your doctor might tell you to soak the vulva in 2 or 3 inches of warm water. This is called a "sitz bath." You can do this for 5 minutes in the morning and 5 minutes at night. Do not add soap, bubble bath, or anything else to the water.  If your vulvar itching is caused by an infection, your sex partner might need to see a doctor.  Can vulvar itching be prevented? - You can prevent vulvar itching from some causes by: ?Using  warm water and unscented non-soap cleanser to wash your vulva ?Taking baths in plain warm water, and not using scented bath products ?Not using a hair dryer or rubbing the vulva dry - Instead, dry it by patting with a soft towel.  ?Wearing cotton underwear, and not wearing underwear or pants that are too tight ?Not using sprays or powders on your vulva  ?Not douching (douching is when a woman puts a liquid inside her vagina to rinse it out) ?Not wiping with baby wipes or scented toilet paper after you use the toilet

## 2013-03-20 ENCOUNTER — Other Ambulatory Visit: Payer: Self-pay | Admitting: *Deleted

## 2013-03-20 ENCOUNTER — Ambulatory Visit: Admitting: Obstetrics & Gynecology

## 2013-03-20 ENCOUNTER — Encounter: Payer: Self-pay | Admitting: Obstetrics & Gynecology

## 2013-03-20 ENCOUNTER — Ambulatory Visit (INDEPENDENT_AMBULATORY_CARE_PROVIDER_SITE_OTHER): Admitting: Obstetrics & Gynecology

## 2013-03-20 VITALS — BP 124/82 | HR 81 | Temp 97.8°F | Wt 178.0 lb

## 2013-03-20 DIAGNOSIS — N390 Urinary tract infection, site not specified: Secondary | ICD-10-CM | POA: Insufficient documentation

## 2013-03-20 DIAGNOSIS — R350 Frequency of micturition: Secondary | ICD-10-CM

## 2013-03-20 LAB — POCT URINALYSIS DIPSTICK
Bilirubin, UA: NEGATIVE
Blood, UA: NEGATIVE
Nitrite, UA: NEGATIVE
Protein, UA: NEGATIVE
pH, UA: 8

## 2013-03-20 MED ORDER — LEVOFLOXACIN 750 MG PO TABS: 750.0000 mg | ORAL_TABLET | Freq: Every day | ORAL | Status: DC

## 2013-03-20 NOTE — Patient Instructions (Addendum)
Urinary Tract Infection  Urinary tract infections (UTIs) can develop anywhere along your urinary tract. Your urinary tract is your body's drainage system for removing wastes and extra water. Your urinary tract includes two kidneys, two ureters, a bladder, and a urethra. Your kidneys are a pair of bean-shaped organs. Each kidney is about the size of your fist. They are located below your ribs, one on each side of your spine.  CAUSES  Infections are caused by microbes, which are microscopic organisms, including fungi, viruses, and bacteria. These organisms are so small that they can only be seen through a microscope. Bacteria are the microbes that most commonly cause UTIs.  SYMPTOMS   Symptoms of UTIs may vary by age and gender of the patient and by the location of the infection. Symptoms in young women typically include a frequent and intense urge to urinate and a painful, burning feeling in the bladder or urethra during urination. Older women and men are more likely to be tired, shaky, and weak and have muscle aches and abdominal pain. A fever may mean the infection is in your kidneys. Other symptoms of a kidney infection include pain in your back or sides below the ribs, nausea, and vomiting.  DIAGNOSIS  To diagnose a UTI, your caregiver will ask you about your symptoms. Your caregiver also will ask to provide a urine sample. The urine sample will be tested for bacteria and white blood cells. White blood cells are made by your body to help fight infection.  TREATMENT   Typically, UTIs can be treated with medication. Because most UTIs are caused by a bacterial infection, they usually can be treated with the use of antibiotics. The choice of antibiotic and length of treatment depend on your symptoms and the type of bacteria causing your infection.  HOME CARE INSTRUCTIONS   If you were prescribed antibiotics, take them exactly as your caregiver instructs you. Finish the medication even if you feel better after you  have only taken some of the medication.   Drink enough water and fluids to keep your urine clear or pale yellow.   Avoid caffeine, tea, and carbonated beverages. They tend to irritate your bladder.   Empty your bladder often. Avoid holding urine for long periods of time.   Empty your bladder before and after sexual intercourse.   After a bowel movement, women should cleanse from front to back. Use each tissue only once.  SEEK MEDICAL CARE IF:    You have back pain.   You develop a fever.   Your symptoms do not begin to resolve within 3 days.  SEEK IMMEDIATE MEDICAL CARE IF:    You have severe back pain or lower abdominal pain.   You develop chills.   You have nausea or vomiting.   You have continued burning or discomfort with urination.  MAKE SURE YOU:    Understand these instructions.   Will watch your condition.   Will get help right away if you are not doing well or get worse.  Document Released: 06/20/2005 Document Revised: 03/11/2012 Document Reviewed: 10/19/2011  [employer] Patient Information 2014 [employer].

## 2013-03-20 NOTE — Progress Notes (Signed)
   Subjective:    Erin West is a 62 y.o. female who complains of abnormal smelling urine, erythema of vaginal area, frequency, hesitancy, incomplete bladder emptying and nocturia. She has had symptoms for unresolved since December 2013 . Patient denies back pain, fever and headache. Patient does not have a history of recurrent UTI. Patient does not have a history of pyelonephritis.   The following portions of the patient's history were reviewed and updated as appropriate: allergies, current medications, past family history, past medical history, past social history, past surgical history and problem list.  Post residual catheter = 60ml.  Review of Systems Pertinent items are noted in HPI.    Objective:    No exam today      Assessment:    Acute cystitis and Recurrent versus persistent urinary tract infection   Contact dermatitis The pathology report from the prior biopsy of the vulva was reviewed with the patient   Plan:    Medications: Levaquin for 10 days.  Avoidance of irritants was discussed Plan to counsel regarding topical estrogen therapy as well as self treatment of urinary tract infections symptoms Return when necessary or in several months

## 2013-03-21 LAB — URINALYSIS
Ketones, ur: NEGATIVE mg/dL
Nitrite: NEGATIVE
Specific Gravity, Urine: 1.015 (ref 1.005–1.030)
Urobilinogen, UA: 0.2 mg/dL (ref 0.0–1.0)

## 2013-03-22 LAB — URINE CULTURE: Organism ID, Bacteria: NO GROWTH

## 2013-04-21 ENCOUNTER — Ambulatory Visit: Admitting: Podiatry

## 2013-04-28 ENCOUNTER — Ambulatory Visit: Admitting: Podiatry

## 2013-04-29 ENCOUNTER — Encounter: Payer: Self-pay | Admitting: Podiatry

## 2013-04-29 ENCOUNTER — Ambulatory Visit (INDEPENDENT_AMBULATORY_CARE_PROVIDER_SITE_OTHER): Admitting: Obstetrics & Gynecology

## 2013-04-29 ENCOUNTER — Encounter: Payer: Self-pay | Admitting: Obstetrics & Gynecology

## 2013-04-29 ENCOUNTER — Ambulatory Visit (INDEPENDENT_AMBULATORY_CARE_PROVIDER_SITE_OTHER): Admitting: Podiatry

## 2013-04-29 VITALS — BP 130/82 | HR 80

## 2013-04-29 VITALS — BP 136/86 | HR 84 | Temp 98.1°F | Ht 63.5 in | Wt 184.0 lb

## 2013-04-29 DIAGNOSIS — L600 Ingrowing nail: Secondary | ICD-10-CM

## 2013-04-29 DIAGNOSIS — B351 Tinea unguium: Secondary | ICD-10-CM | POA: Diagnosis not present

## 2013-04-29 DIAGNOSIS — L84 Corns and callosities: Secondary | ICD-10-CM | POA: Insufficient documentation

## 2013-04-29 DIAGNOSIS — N94819 Vulvodynia, unspecified: Secondary | ICD-10-CM

## 2013-04-29 MED ORDER — AMITRIPTYLINE HCL 10 MG PO TABS: 10.0000 mg | ORAL_TABLET | Freq: Every day | ORAL | Status: DC

## 2013-04-29 NOTE — Progress Notes (Signed)
Subjective:  62 Y/O female presents complaining of pain on medial border of left great toe and at medial aspect of the hallux. There is some mild pain on right great toe.   Objective: Hard callus build up at the medial border left great toe nail.   Thick callus build up at the medial nail border of right hallux less extent..  Both great toe nails are short this time.  All other nails are thick, long, and incurvated.   Assessment: Painful callus at medial borders both great toe L>R.  Hypertrophic nails on lesser digits.  Plan: Debrided hard callus at great toe nail borders and nails on lesser digits.  Follow up in 2 months.

## 2013-04-29 NOTE — Progress Notes (Signed)
Subjective:     Erin West is a 62 y.o. female here for a routine exam.  Current complaints: vaginal burning and itching. Pt states she was started on Premarin in June 2014. Pt states she feels as if the medication is not helping. Personal health questionnaire reviewed: yes.   Gynecologic History No LMP recorded. Patient is postmenopausal. Contraception: post menopausal status   Obstetric History OB History   Grav Para Term Preterm Abortions TAB SAB Ect Mult Living   3 3        3      # Outc Date GA Lbr Len/2nd Wgt Sex Del Anes PTL Lv   1 PAR            2 PAR            3 PAR                The following portions of the patient's history were reviewed and updated as appropriate: allergies, current medications, past family history, past medical history, past social history, past surgical history and problem list.  Review of Systems Pertinent items are noted in HPI.    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

## 2013-05-01 ENCOUNTER — Encounter: Payer: Self-pay | Admitting: Obstetrics & Gynecology

## 2013-05-05 ENCOUNTER — Encounter: Payer: Self-pay | Admitting: Obstetrics & Gynecology

## 2013-05-08 ENCOUNTER — Encounter: Payer: Self-pay | Admitting: Obstetrics & Gynecology

## 2013-06-11 ENCOUNTER — Ambulatory Visit: Admitting: Obstetrics & Gynecology

## 2013-06-24 ENCOUNTER — Encounter: Payer: Self-pay | Admitting: Podiatry

## 2013-06-24 ENCOUNTER — Ambulatory Visit (INDEPENDENT_AMBULATORY_CARE_PROVIDER_SITE_OTHER): Admitting: Podiatry

## 2013-06-24 VITALS — BP 134/93 | HR 77 | Ht 63.0 in | Wt 178.0 lb

## 2013-06-24 DIAGNOSIS — L600 Ingrowing nail: Secondary | ICD-10-CM

## 2013-06-24 DIAGNOSIS — M25579 Pain in unspecified ankle and joints of unspecified foot: Secondary | ICD-10-CM

## 2013-06-24 DIAGNOSIS — M79672 Pain in left foot: Secondary | ICD-10-CM | POA: Insufficient documentation

## 2013-06-24 DIAGNOSIS — M25572 Pain in left ankle and joints of left foot: Secondary | ICD-10-CM

## 2013-06-24 DIAGNOSIS — M722 Plantar fascial fibromatosis: Secondary | ICD-10-CM

## 2013-06-24 NOTE — Progress Notes (Signed)
Patient presents complaining of painful ingrown nail on left great toe and pain on left heel. Left great toe nails has done P&A in March 2014, and the nail root has grown back. Left heel pain also came back.  Objective: Recurring ingrown nail left great toe with pain. Left heel pain. Hyper dorsiflextion of left first MPJ.   Assessment: Plantar fasciitis left heel. Recurring onychocryptosis left hallux medial border.  Treatment: Left heel injected with 10 mg Triamcinolone and 4 mg Dexamethasone. Left hallux anesthetized with 5 ml of 1% Xylocaine with Epinephrine. Excision of nail and matrix done on medial border left hallux.  Patient tolerated well. Home care instruction given to patient.  Return next Monday for dressing change.

## 2013-06-24 NOTE — Patient Instructions (Addendum)
Excision of nail and matrix medial border left hallux done. Please do not disturb dressing. Return next week for dressing change.

## 2013-06-25 ENCOUNTER — Ambulatory Visit: Admitting: Obstetrics & Gynecology

## 2013-06-29 ENCOUNTER — Ambulatory Visit: Admitting: Podiatry

## 2013-06-29 ENCOUNTER — Ambulatory Visit (INDEPENDENT_AMBULATORY_CARE_PROVIDER_SITE_OTHER): Admitting: Podiatry

## 2013-06-29 ENCOUNTER — Encounter: Payer: Self-pay | Admitting: Podiatry

## 2013-06-29 DIAGNOSIS — M79672 Pain in left foot: Secondary | ICD-10-CM

## 2013-06-29 DIAGNOSIS — M79609 Pain in unspecified limb: Secondary | ICD-10-CM

## 2013-06-29 DIAGNOSIS — M722 Plantar fascial fibromatosis: Secondary | ICD-10-CM

## 2013-06-29 NOTE — Patient Instructions (Addendum)
Post op wound healing is doing well on left great toe. Keep it covered with Steri strip and bandaid. Ok to shower and may swim next week. Night splint dispensed.

## 2013-06-29 NOTE — Progress Notes (Signed)
S/P 5 days since Excision of nail and matrix left great toe medial border. Also complaining of left heel pain and wants to pick up Night Splint.  Objective: Left great toe surgery site is well coapted and dry.  Assessment: Satisfactory wound healing left great toe. Plantar fasciitis left heel.  Plan: Wound cleansed with H2O2 and Betadine solution. Steri strip replaced. Home care instruction given with supply. Night Splint dispensed.

## 2013-07-01 ENCOUNTER — Encounter: Admitting: Podiatry

## 2013-07-14 DIAGNOSIS — M159 Polyosteoarthritis, unspecified: Secondary | ICD-10-CM | POA: Insufficient documentation

## 2013-07-14 DIAGNOSIS — M8580 Other specified disorders of bone density and structure, unspecified site: Secondary | ICD-10-CM | POA: Insufficient documentation

## 2013-08-06 ENCOUNTER — Ambulatory Visit: Admitting: Obstetrics & Gynecology

## 2013-09-08 DIAGNOSIS — K589 Irritable bowel syndrome without diarrhea: Secondary | ICD-10-CM | POA: Insufficient documentation

## 2014-01-14 ENCOUNTER — Other Ambulatory Visit: Payer: Self-pay | Admitting: Gastroenterology

## 2014-01-14 DIAGNOSIS — R1310 Dysphagia, unspecified: Secondary | ICD-10-CM

## 2014-01-26 ENCOUNTER — Ambulatory Visit
Admission: RE | Admit: 2014-01-26 | Discharge: 2014-01-26 | Disposition: A | Discharge status: Home / Self Care | Source: Ambulatory Visit | Attending: Gastroenterology | Admitting: Gastroenterology

## 2014-01-26 DIAGNOSIS — R1310 Dysphagia, unspecified: Secondary | ICD-10-CM

## 2014-01-28 DIAGNOSIS — R1310 Dysphagia, unspecified: Secondary | ICD-10-CM | POA: Insufficient documentation

## 2014-02-10 DIAGNOSIS — Z8719 Personal history of other diseases of the digestive system: Secondary | ICD-10-CM | POA: Insufficient documentation

## 2014-03-30 DIAGNOSIS — R600 Localized edema: Secondary | ICD-10-CM | POA: Insufficient documentation

## 2014-04-19 ENCOUNTER — Other Ambulatory Visit (HOSPITAL_COMMUNITY): Payer: Self-pay | Admitting: Family Medicine

## 2014-04-19 DIAGNOSIS — M7989 Other specified soft tissue disorders: Secondary | ICD-10-CM

## 2014-04-29 ENCOUNTER — Ambulatory Visit (HOSPITAL_COMMUNITY)
Admission: RE | Admit: 2014-04-29 | Discharge: 2014-04-29 | Disposition: A | Discharge status: Home / Self Care | Source: Ambulatory Visit | Attending: Cardiovascular Disease | Admitting: Cardiovascular Disease

## 2014-04-29 DIAGNOSIS — I519 Heart disease, unspecified: Secondary | ICD-10-CM

## 2014-04-29 DIAGNOSIS — R609 Edema, unspecified: Secondary | ICD-10-CM

## 2014-04-29 DIAGNOSIS — E785 Hyperlipidemia, unspecified: Secondary | ICD-10-CM | POA: Insufficient documentation

## 2014-04-29 DIAGNOSIS — M7989 Other specified soft tissue disorders: Secondary | ICD-10-CM | POA: Insufficient documentation

## 2014-04-29 DIAGNOSIS — I159 Secondary hypertension, unspecified: Secondary | ICD-10-CM

## 2014-04-29 DIAGNOSIS — I150 Renovascular hypertension: Secondary | ICD-10-CM

## 2014-04-29 DIAGNOSIS — I10 Essential (primary) hypertension: Secondary | ICD-10-CM | POA: Insufficient documentation

## 2014-04-29 NOTE — Progress Notes (Signed)
2D Echocardiogram Complete.  04/29/2014   Christian Borgerding, RDCS

## 2014-04-30 ENCOUNTER — Telehealth: Payer: Self-pay | Admitting: Internal Medicine

## 2014-04-30 NOTE — Telephone Encounter (Signed)
Patient notified that final report is not available but she was informed of EF and explained what this meant. She asked if this test would show how well her circulation is in her legs, and I told her that would be an arterial doppler study.

## 2014-04-30 NOTE — Telephone Encounter (Signed)
Erin West called wanting to know her results from her Echo that was done yesterday. Please call  thanks

## 2014-05-03 ENCOUNTER — Telehealth: Payer: Self-pay | Admitting: Internal Medicine

## 2014-05-03 NOTE — Telephone Encounter (Signed)
Pt called in stating that she received a message from [facility] about her test results and had them faxed to her PCP. She has changed PCP's so that was the wrong office. Please call  Thanks

## 2014-05-03 NOTE — Telephone Encounter (Signed)
Spoke with pt, echo results forwarded to dr andy per the pt request. She was referred to dr andy office regarding further testing and results. Patient voiced understanding

## 2014-05-03 NOTE — Telephone Encounter (Signed)
Do we have her Echo results yet?

## 2014-07-26 ENCOUNTER — Encounter: Payer: Self-pay | Admitting: Podiatry

## 2015-04-29 ENCOUNTER — Encounter: Payer: Self-pay | Admitting: Internal Medicine

## 2015-08-16 ENCOUNTER — Ambulatory Visit (INDEPENDENT_AMBULATORY_CARE_PROVIDER_SITE_OTHER): Admitting: Podiatry

## 2015-08-16 ENCOUNTER — Encounter: Payer: Self-pay | Admitting: Podiatry

## 2015-08-16 VITALS — BP 134/91 | HR 78 | Ht 63.0 in | Wt 179.0 lb

## 2015-08-16 DIAGNOSIS — M65972 Unspecified synovitis and tenosynovitis, left ankle and foot: Secondary | ICD-10-CM | POA: Insufficient documentation

## 2015-08-16 DIAGNOSIS — M216X2 Other acquired deformities of left foot: Secondary | ICD-10-CM | POA: Diagnosis not present

## 2015-08-16 DIAGNOSIS — M21962 Unspecified acquired deformity of left lower leg: Secondary | ICD-10-CM | POA: Diagnosis not present

## 2015-08-16 DIAGNOSIS — M659 Synovitis and tenosynovitis, unspecified: Secondary | ICD-10-CM | POA: Insufficient documentation

## 2015-08-16 DIAGNOSIS — M216X9 Other acquired deformities of unspecified foot: Secondary | ICD-10-CM | POA: Insufficient documentation

## 2015-08-16 NOTE — Progress Notes (Signed)
Subjective: 64 year old female presents with pain at left lateral ankle inferior and lateral column of left foot pain x one month. Patient does not recall any injury to cause the pain.  Past history of medial ankle pain and plantar fasciitis left. Wearing soft insert for plantar fasciitis that is helping.   Objective: Neurovascular status are within normal. No abnormal swelling or findings noted. Orthopedic examination reveal hypermobile, excess sagittal plane motion first ray left foot. Excess pronating Subtalar joint left foot. Pain on lateral column upon loading of forefoot left.   Assessment: Lateral weight shifting left foot. STJ hyperpronation left. Unstable first ray left.  Plan: May benefit from custom orthotics and ankle support to limit side to side motion of the ankle and Subtalar joint motion. Metatarsal binder dispensed x 1 to add more stability to the base of the first Metatarsal bone. Use binder during activity and remove at night. May use as needed.  Patient is satisfied with her soft insole for plantar fasciitis. Return as needed.

## 2015-08-16 NOTE — Patient Instructions (Signed)
Seen for pain in left lateral ankle and foot x 1 month. Noted of weak and unstable first metatarsal bone during weight bearing. May benefit from custom orthotics and ankle support to limit side to side motion of the ankle and Subtalar joint motion. Metatarsal binder dispensed x 1 to add more stability to the base of the first Metatarsal bone. Use binder during activity and remove at night. May use as needed. Return as needed.

## 2015-10-15 LAB — HM PAP SMEAR: HM PAP: NEGATIVE

## 2015-11-19 ENCOUNTER — Encounter (HOSPITAL_COMMUNITY): Payer: Self-pay | Admitting: Emergency Medicine

## 2015-11-19 ENCOUNTER — Emergency Department (INDEPENDENT_AMBULATORY_CARE_PROVIDER_SITE_OTHER)
Admission: EM | Admit: 2015-11-19 | Discharge: 2015-11-19 | Disposition: A | Discharge status: Home / Self Care | Source: Home / Self Care | Attending: Emergency Medicine | Admitting: Emergency Medicine

## 2015-11-19 ENCOUNTER — Emergency Department (INDEPENDENT_AMBULATORY_CARE_PROVIDER_SITE_OTHER)

## 2015-11-19 DIAGNOSIS — Z20828 Contact with and (suspected) exposure to other viral communicable diseases: Secondary | ICD-10-CM

## 2015-11-19 DIAGNOSIS — R059 Cough, unspecified: Secondary | ICD-10-CM

## 2015-11-19 DIAGNOSIS — R05 Cough: Secondary | ICD-10-CM

## 2015-11-19 MED ORDER — DEXAMETHASONE SODIUM PHOSPHATE 10 MG/ML IJ SOLN
10.0000 mg | Freq: Once | INTRAMUSCULAR | Status: AC
  Administered 2015-11-19: 10 mg via INTRAMUSCULAR

## 2015-11-19 MED ORDER — IPRATROPIUM BROMIDE HFA 17 MCG/ACT IN AERS: 2.0000 | INHALATION_SPRAY | RESPIRATORY_TRACT | Status: DC | PRN

## 2015-11-19 MED ORDER — DOXYCYCLINE HYCLATE 100 MG PO CAPS: 100.0000 mg | ORAL_CAPSULE | Freq: Two times a day (BID) | ORAL | Status: DC

## 2015-11-19 MED ORDER — OSELTAMIVIR PHOSPHATE 75 MG PO CAPS: 75.0000 mg | ORAL_CAPSULE | Freq: Two times a day (BID) | ORAL | Status: DC

## 2015-11-19 MED ORDER — IPRATROPIUM-ALBUTEROL 0.5-2.5 (3) MG/3ML IN SOLN
RESPIRATORY_TRACT | Status: AC
  Filled 2015-11-19: qty 3

## 2015-11-19 MED ORDER — IPRATROPIUM-ALBUTEROL 0.5-2.5 (3) MG/3ML IN SOLN
3.0000 mL | Freq: Once | RESPIRATORY_TRACT | Status: AC
  Administered 2015-11-19: 3 mL via RESPIRATORY_TRACT

## 2015-11-19 MED ORDER — DEXAMETHASONE SODIUM PHOSPHATE 10 MG/ML IJ SOLN
INTRAMUSCULAR | Status: AC
  Filled 2015-11-19: qty 1

## 2015-11-19 NOTE — Discharge Instructions (Signed)
I am concerned that your cough is coming from whooping cough given your exposure. You've already received treatment for whooping cough with azithromycin. The cough can be difficult to treat. We are going to cover you with additional antibiotics, doxycycline for 10 days. I also recommend taking Tamiflu for 5 days given the exposure to flu. We gave you a shot today to help with the vocal cord spasms. Use the ipratropium inhaler every 4 hours as needed for cough. Please follow-up with your primary care doctor next week for a recheck.

## 2015-11-19 NOTE — ED Provider Notes (Signed)
CSN: [id number]     Arrival date & time 11/19/15  1317 History   First MD Initiated Contact with Patient 11/19/15 1424     Chief Complaint  Patient presents with  . URI   (Consider location/radiation/quality/duration/timing/severity/associated sxs/prior Treatment) HPI She is a 65 year old woman here for evaluation of cough. She states her symptoms started about 3 weeks ago with upper respiratory symptoms including nasal congestion, throat irritation, and cough. She was exposed to a 50 year old child who was diagnosed with whooping cough.  She saw her primary care physician who treated her for bronchitis with azithromycin. She states her symptoms did not improve. About a week later, she was treated with prednisone, which also did not help. Currently, she continues to have a paroxysmal cough. When she starts coughing, she will feel like she cannot take a breath and develop an inspiratory gasping sound. She denies any known fevers at home. She does state that the child with whooping cough was just recently diagnosed with influenza as well.  Past Medical History  Diagnosis Date  . Hypertension   . GERD (gastroesophageal reflux disease)   . High cholesterol   . Knee pain   . Palpitations     [employer] MCT Monitor 04/16/10-04/22/10   . Atypical chest pain     Stress test 04/21/10 - post-stress EF=89%. Normal scan.  . DOE (dyspnea on exertion)   . Fatigue   . SOB (shortness of breath)   . Aspiration pneumonia ([facility]) 2013  . Diarrhea   . Syncope     Pain-mediated syncope  . Dyslipidemia   . Anxiety   . Arthritis   . Diastolic dysfunction     But normal LV function on ECHO 2011 and low risk Myoview in 2011   Past Surgical History  Procedure Laterality Date  . Cholecystectomy  2006  . Tubal ligation  06/1978  . Replacement total knee Left     left  . Knee arthroscopy Right    Family History  Problem Relation Age of Onset  . Heart disease Mother   . Other Mother     leg amputation  .  Mental illness Father   . Sudden death Father   . Stroke Paternal Grandfather    Social History  Substance Use Topics  . Smoking status: Never Smoker   . Smokeless tobacco: Never Used  . Alcohol Use: 0.0 oz/week    0 Standard drinks or equivalent per week     Comment: 2-3/wine per month   OB History    Gravida Para Term Preterm AB TAB SAB Ectopic Multiple Living   3 3        3      Review of Systems As in history of present illness Allergies  Penicillins; Sulfa antibiotics; Ciprofloxacin; and Trimethoprim  Home Medications   Prior to Admission medications   Medication Sig Start Date End Date Taking? Authorizing Provider  amLODipine (NORVASC) 5 MG tablet Take 5 mg by mouth daily.   Yes Historical Provider, MD  atorvastatin (LIPITOR) 20 MG tablet Take 20 mg by mouth daily.   Yes Historical Provider, MD  carvedilol (COREG) 6.25 MG tablet Take 6.25 mg by mouth 2 (two) times daily with a meal.   Yes Historical Provider, MD  esomeprazole (NEXIUM) 40 MG capsule Take 40 mg by mouth daily before breakfast.   Yes Historical Provider, MD  losartan (COZAAR) 100 MG tablet Take 100 mg by mouth daily.   Yes Historical Provider, MD  Multiple Vitamin (MULTIVITAMIN) tablet  Take 1 tablet by mouth daily.   Yes Historical Provider, MD  amitriptyline (ELAVIL) 10 MG tablet Take 1 tablet (10 mg total) by mouth at bedtime. 04/29/13   Lahoma Crocker, MD  Cholecalciferol (VITAMIN D) 2000 UNITS CAPS Take by mouth daily.    Historical Provider, MD  ciclopirox (PENLAC) 8 % solution Apply topically at bedtime. Apply over nail and surrounding skin. Apply daily over previous coat. After seven (7) days, may remove with alcohol and continue cycle. 02/18/13   Myeong O Sheard, DPM  Coenzyme Q10-Fish Oil-Vit E (CO-Q 10 OMEGA-3 FISH OIL PO) Take by mouth.    Historical Provider, MD  doxycycline (VIBRAMYCIN) 100 MG capsule Take 1 capsule (100 mg total) by mouth 2 (two) times daily. 11/19/15   Melony Overly, MD   ipratropium (ATROVENT HFA) 17 MCG/ACT inhaler Inhale 2 puffs into the lungs every 4 (four) hours as needed (cough). 11/19/15   Melony Overly, MD  naproxen sodium (ANAPROX) 220 MG tablet Take 220 mg by mouth 2 (two) times daily with a meal.    Historical Provider, MD  oseltamivir (TAMIFLU) 75 MG capsule Take 1 capsule (75 mg total) by mouth every 12 (twelve) hours. 11/19/15   Melony Overly, MD   Meds Ordered and Administered this Visit   Medications  ipratropium-albuterol (DUONEB) 0.5-2.5 (3) MG/3ML nebulizer solution 3 mL (3 mLs Nebulization Given 11/19/15 1501)  dexamethasone (DECADRON) injection 10 mg (10 mg Intramuscular Given 11/19/15 1559)    BP 126/81 mmHg  Pulse 86  Temp(Src) 100 F (37.8 C) (Oral)  SpO2 97% No data found.   Physical Exam  Constitutional: She is oriented to person, place, and time. She appears well-developed and well-nourished. No distress.  HENT:  Mouth/Throat: Oropharynx is clear and moist. No oropharyngeal exudate.  Neck: Neck supple.  Cardiovascular: Normal rate, regular rhythm and normal heart sounds.   Pulmonary/Chest: Effort normal and breath sounds normal. No respiratory distress. She has no wheezes. She has no rales.  She did have some stridor with a coughing spasm.  Lymphadenopathy:    She has no cervical adenopathy.  Neurological: She is alert and oriented to person, place, and time.    ED Course  Procedures (including critical care time)  Labs Review Labs Reviewed - No data to display  Imaging Review Dg Chest 2 View  11/19/2015  CLINICAL DATA:  65 year old female with cough and shortness of breath. EXAM: CHEST  2 VIEW COMPARISON:  04/09/2010 FINDINGS: The cardiomediastinal silhouette is unremarkable. There is no evidence of focal airspace disease, pulmonary edema, suspicious pulmonary nodule/mass, pleural effusion, or pneumothorax. No acute bony abnormalities are identified. IMPRESSION: No active cardiopulmonary disease. Electronically Signed    By: Margarette Canada M.D.   On: 11/19/2015 15:01      MDM   1. Cough   2. Exposure to the flu    She reports some subjective improvement after the DuoNeb.  Cough is likely post viral versus whooping cough. She does have an exposure to whooping cough, and she is not sure when she last had a pertussis updated. She has been adequately treated with azithromycin. I suspect her stridor is coming from vocal cord spasm triggered by the cough. We did give dexamethasone IM prior to discharge. Will treat with doxycycline to cover any lingering bronchitis. Tamiflu for exposure to flu. Ipratropium inhaler as there is some evidence for this helping with cough. Follow-up with PCP next week.    Melony Overly, MD 11/19/15 [phone]

## 2015-11-19 NOTE — ED Notes (Signed)
C/o cold sx onset 02/08 associated w/prod cough and dyspnea and SOB due to cough Was treated by PCP for bronchitis and given Z-pack and prednisone w/no relief A&O x4... noa cute distress.

## 2015-12-28 DIAGNOSIS — Z79899 Other long term (current) drug therapy: Secondary | ICD-10-CM | POA: Diagnosis not present

## 2015-12-28 DIAGNOSIS — M1711 Unilateral primary osteoarthritis, right knee: Secondary | ICD-10-CM | POA: Diagnosis not present

## 2015-12-28 DIAGNOSIS — Z01812 Encounter for preprocedural laboratory examination: Secondary | ICD-10-CM | POA: Diagnosis not present

## 2015-12-28 LAB — HEMOGLOBIN A1C: Hemoglobin A1C: 5

## 2016-01-16 DIAGNOSIS — Z5309 Procedure and treatment not carried out because of other contraindication: Secondary | ICD-10-CM | POA: Diagnosis not present

## 2016-01-16 DIAGNOSIS — M1711 Unilateral primary osteoarthritis, right knee: Secondary | ICD-10-CM | POA: Diagnosis not present

## 2016-01-23 DIAGNOSIS — M1711 Unilateral primary osteoarthritis, right knee: Secondary | ICD-10-CM | POA: Diagnosis not present

## 2016-01-23 DIAGNOSIS — I10 Essential (primary) hypertension: Secondary | ICD-10-CM | POA: Diagnosis present

## 2016-01-23 DIAGNOSIS — E669 Obesity, unspecified: Secondary | ICD-10-CM | POA: Diagnosis present

## 2016-01-23 DIAGNOSIS — Z888 Allergy status to other drugs, medicaments and biological substances status: Secondary | ICD-10-CM | POA: Diagnosis not present

## 2016-01-23 DIAGNOSIS — E785 Hyperlipidemia, unspecified: Secondary | ICD-10-CM | POA: Diagnosis present

## 2016-01-23 DIAGNOSIS — Z6833 Body mass index (BMI) 33.0-33.9, adult: Secondary | ICD-10-CM | POA: Diagnosis not present

## 2016-01-23 DIAGNOSIS — K219 Gastro-esophageal reflux disease without esophagitis: Secondary | ICD-10-CM | POA: Diagnosis present

## 2016-01-23 DIAGNOSIS — Z882 Allergy status to sulfonamides status: Secondary | ICD-10-CM | POA: Diagnosis not present

## 2016-01-23 DIAGNOSIS — Z880 Allergy status to penicillin: Secondary | ICD-10-CM | POA: Diagnosis not present

## 2016-01-24 DIAGNOSIS — Z96651 Presence of right artificial knee joint: Secondary | ICD-10-CM | POA: Insufficient documentation

## 2016-01-27 DIAGNOSIS — Z471 Aftercare following joint replacement surgery: Secondary | ICD-10-CM | POA: Diagnosis not present

## 2016-01-27 DIAGNOSIS — I10 Essential (primary) hypertension: Secondary | ICD-10-CM | POA: Diagnosis not present

## 2016-01-30 DIAGNOSIS — Z471 Aftercare following joint replacement surgery: Secondary | ICD-10-CM | POA: Diagnosis not present

## 2016-01-30 DIAGNOSIS — I10 Essential (primary) hypertension: Secondary | ICD-10-CM | POA: Diagnosis not present

## 2016-02-01 ENCOUNTER — Emergency Department (HOSPITAL_COMMUNITY): Payer: Medicare Other

## 2016-02-01 ENCOUNTER — Emergency Department (HOSPITAL_COMMUNITY)
Admission: EM | Admit: 2016-02-01 | Discharge: 2016-02-01 | Disposition: A | Discharge status: Home / Self Care | Payer: Medicare Other | Attending: Emergency Medicine | Admitting: Emergency Medicine

## 2016-02-01 ENCOUNTER — Emergency Department (EMERGENCY_DEPARTMENT_HOSPITAL)
Admit: 2016-02-01 | Discharge: 2016-02-01 | Disposition: A | Discharge status: Home / Self Care | Payer: Medicare Other | Attending: Emergency Medicine | Admitting: Emergency Medicine

## 2016-02-01 ENCOUNTER — Encounter (HOSPITAL_COMMUNITY): Payer: Self-pay

## 2016-02-01 DIAGNOSIS — Z792 Long term (current) use of antibiotics: Secondary | ICD-10-CM | POA: Insufficient documentation

## 2016-02-01 DIAGNOSIS — I10 Essential (primary) hypertension: Secondary | ICD-10-CM | POA: Diagnosis not present

## 2016-02-01 DIAGNOSIS — Z79899 Other long term (current) drug therapy: Secondary | ICD-10-CM | POA: Insufficient documentation

## 2016-02-01 DIAGNOSIS — Z7951 Long term (current) use of inhaled steroids: Secondary | ICD-10-CM | POA: Insufficient documentation

## 2016-02-01 DIAGNOSIS — I82401 Acute embolism and thrombosis of unspecified deep veins of right lower extremity: Secondary | ICD-10-CM | POA: Diagnosis not present

## 2016-02-01 DIAGNOSIS — E7800 Pure hypercholesterolemia, unspecified: Secondary | ICD-10-CM | POA: Insufficient documentation

## 2016-02-01 DIAGNOSIS — Z96652 Presence of left artificial knee joint: Secondary | ICD-10-CM | POA: Insufficient documentation

## 2016-02-01 DIAGNOSIS — R42 Dizziness and giddiness: Secondary | ICD-10-CM | POA: Diagnosis not present

## 2016-02-01 DIAGNOSIS — M1990 Unspecified osteoarthritis, unspecified site: Secondary | ICD-10-CM | POA: Insufficient documentation

## 2016-02-01 DIAGNOSIS — E785 Hyperlipidemia, unspecified: Secondary | ICD-10-CM | POA: Insufficient documentation

## 2016-02-01 DIAGNOSIS — M79604 Pain in right leg: Secondary | ICD-10-CM

## 2016-02-01 DIAGNOSIS — Z79891 Long term (current) use of opiate analgesic: Secondary | ICD-10-CM | POA: Diagnosis not present

## 2016-02-01 DIAGNOSIS — R55 Syncope and collapse: Secondary | ICD-10-CM | POA: Insufficient documentation

## 2016-02-01 DIAGNOSIS — K219 Gastro-esophageal reflux disease without esophagitis: Secondary | ICD-10-CM | POA: Diagnosis not present

## 2016-02-01 DIAGNOSIS — M79661 Pain in right lower leg: Secondary | ICD-10-CM | POA: Diagnosis not present

## 2016-02-01 DIAGNOSIS — I959 Hypotension, unspecified: Secondary | ICD-10-CM | POA: Diagnosis present

## 2016-02-01 DIAGNOSIS — R531 Weakness: Secondary | ICD-10-CM | POA: Diagnosis not present

## 2016-02-01 DIAGNOSIS — I82491 Acute embolism and thrombosis of other specified deep vein of right lower extremity: Secondary | ICD-10-CM | POA: Diagnosis not present

## 2016-02-01 DIAGNOSIS — R404 Transient alteration of awareness: Secondary | ICD-10-CM | POA: Diagnosis not present

## 2016-02-01 DIAGNOSIS — Z7902 Long term (current) use of antithrombotics/antiplatelets: Secondary | ICD-10-CM | POA: Diagnosis not present

## 2016-02-01 DIAGNOSIS — Z471 Aftercare following joint replacement surgery: Secondary | ICD-10-CM | POA: Diagnosis not present

## 2016-02-01 LAB — COMPREHENSIVE METABOLIC PANEL
ALBUMIN: 3.4 g/dL — AB (ref 3.5–5.0)
ALK PHOS: 105 U/L (ref 38–126)
ALT: 27 U/L (ref 14–54)
AST: 18 U/L (ref 15–41)
Anion gap: 8 (ref 5–15)
BUN: 32 mg/dL — AB (ref 6–20)
CALCIUM: 8.8 mg/dL — AB (ref 8.9–10.3)
CO2: 25 mmol/L (ref 22–32)
CREATININE: 0.82 mg/dL (ref 0.44–1.00)
Chloride: 107 mmol/L (ref 101–111)
GFR calc Af Amer: >60 mL/min (ref >60)
GFR calc non Af Amer: >60 mL/min (ref >60)
GLUCOSE: 126 mg/dL — AB (ref 65–99)
Potassium: 4.3 mmol/L (ref 3.5–5.1)
SODIUM: 140 mmol/L (ref 135–145)
Total Bilirubin: 1.1 mg/dL (ref 0.3–1.2)
Total Protein: 6.1 g/dL — ABNORMAL LOW (ref 6.5–8.1)

## 2016-02-01 LAB — CBC WITH DIFFERENTIAL/PLATELET
BASOS ABS: 0 10*3/uL (ref 0.0–0.1)
Basophils Relative: 1 %
EOS PCT: 2 %
Eosinophils Absolute: 0.2 10*3/uL (ref 0.0–0.7)
HEMATOCRIT: 34.6 % — AB (ref 36.0–46.0)
Hemoglobin: 11.6 g/dL — ABNORMAL LOW (ref 12.0–15.0)
LYMPHS ABS: 0.8 10*3/uL (ref 0.7–4.0)
LYMPHS PCT: 10 %
MCH: 29.9 pg (ref 26.0–34.0)
MCHC: 33.5 g/dL (ref 30.0–36.0)
MCV: 89.2 fL (ref 78.0–100.0)
MONOS PCT: 6 %
Monocytes Absolute: 0.5 10*3/uL (ref 0.1–1.0)
Neutro Abs: 6.7 10*3/uL (ref 1.7–7.7)
Neutrophils Relative %: 81 %
Platelets: 276 10*3/uL (ref 150–400)
RBC: 3.88 MIL/uL (ref 3.87–5.11)
RDW: 13.1 % (ref 11.5–15.5)
WBC: 8.1 10*3/uL (ref 4.0–10.5)

## 2016-02-01 LAB — I-STAT TROPONIN, ED
Troponin i, poc: 0 ng/mL (ref 0.00–0.08)
Troponin i, poc: 0 ng/mL (ref 0.00–0.08)

## 2016-02-01 LAB — BRAIN NATRIURETIC PEPTIDE: B Natriuretic Peptide: 150 pg/mL — ABNORMAL HIGH (ref 0.0–100.0)

## 2016-02-01 MED ORDER — SODIUM CHLORIDE 0.9 % IV BOLUS (SEPSIS)
1000.0000 mL | Freq: Once | INTRAVENOUS | Status: AC
  Administered 2016-02-01: 1000 mL via INTRAVENOUS

## 2016-02-01 MED ORDER — IOPAMIDOL (ISOVUE-370) INJECTION 76%
100.0000 mL | Freq: Once | INTRAVENOUS | Status: AC | PRN
Start: 2016-02-01 — End: 2016-02-01
  Administered 2016-02-01: 100 mL via INTRAVENOUS

## 2016-02-01 MED ORDER — RIVAROXABAN (XARELTO) VTE STARTER PACK (15 & 20 MG): 15.0000 mg | ORAL_TABLET | Freq: Two times a day (BID) | ORAL | Status: DC

## 2016-02-01 MED ORDER — RIVAROXABAN 15 MG PO TABS
15.0000 mg | ORAL_TABLET | Freq: Once | ORAL | Status: AC
  Administered 2016-02-01: 15 mg via ORAL
  Filled 2016-02-01: qty 1

## 2016-02-01 NOTE — ED Notes (Signed)
Bed: WA07 Expected date:  Expected time:  Means of arrival:  Comments: EMS - dizzy/hypotensive

## 2016-02-01 NOTE — ED Notes (Signed)
Abd and ace bandage applied to right knee per MD request.

## 2016-02-01 NOTE — ED Notes (Signed)
Pt presents from home via EMS with c/o hypotension and positive LOC. Per EMS, pt recently had a knee replacement and as she was sitting up on the couch with the therapist, she started to feel dizzy and had a positive LOC. Therapist reported pt was out for several minutes. Pt did not fall or hit her head and does remember what happened prior to the syncopal episode and she was incontinent of urine. At this time she denies any dizziness or vision changes. Pt was hypotensive on EMS arrival, 80/46 initially, now 97/55. 20 g IV placed left AC by EMS and 360ml of NS given by EMS en route.

## 2016-02-01 NOTE — ED Provider Notes (Signed)
CSN: [id number]     Arrival date & time 02/01/16  1009 History   First MD Initiated Contact with Patient 02/01/16 1023     Chief Complaint  Patient presents with  . Hypotension  . Loss of Consciousness     (Consider location/radiation/quality/duration/timing/severity/associated sxs/prior Treatment) HPI  Knee replacement 5/1 This AM took pain medications Had PT today, felt severe knee pain with knee exercises, then began to feel lightheaded, pain continued, PT layed her down on sofa then she passed out. Blood pressure was low and didn't come up initially. Received 300cc with EMS. Now feeling back to baseline.  No chest pain, no shortness of breath, no urinary symptoms, no fevers. Not eating and drinking enough. Had severe lightheadedness when getting home after knee surgery due to pain.  Past Medical History  Diagnosis Date  . Hypertension   . GERD (gastroesophageal reflux disease)   . High cholesterol   . Knee pain   . Palpitations     [employer] MCT Monitor 04/16/10-04/22/10   . Atypical chest pain     Stress test 04/21/10 - post-stress EF=89%. Normal scan.  . DOE (dyspnea on exertion)   . Fatigue   . SOB (shortness of breath)   . Aspiration pneumonia ([place]) 2013  . Diarrhea   . Syncope     Pain-mediated syncope  . Dyslipidemia   . Anxiety   . Arthritis   . Diastolic dysfunction     But normal LV function on ECHO 2011 and low risk Myoview in 2011   Past Surgical History  Procedure Laterality Date  . Cholecystectomy  2006  . Tubal ligation  06/1978  . Replacement total knee Left     left  . Knee arthroscopy Right    Family History  Problem Relation Age of Onset  . Heart disease Mother   . Other Mother     leg amputation  . Mental illness Father   . Sudden death Father   . Stroke Paternal Grandfather    Social History  Substance Use Topics  . Smoking status: Never Smoker   . Smokeless tobacco: Never Used  . Alcohol Use: 0.0 oz/week    0 Standard drinks or  equivalent per week     Comment: 2-3/wine per month   OB History    Gravida Para Term Preterm AB TAB SAB Ectopic Multiple Living   3 3        3      Review of Systems  Constitutional: Positive for fatigue. Negative for fever.  HENT: Negative for sore throat.   Eyes: Negative for visual disturbance.  Respiratory: Negative for cough and shortness of breath.   Cardiovascular: Negative for chest pain.  Gastrointestinal: Negative for nausea, vomiting, abdominal pain and diarrhea.  Genitourinary: Negative for difficulty urinating.  Musculoskeletal: Positive for arthralgias. Negative for back pain and neck pain.  Skin: Negative for rash.  Neurological: Positive for syncope and light-headedness. Negative for weakness, numbness and headaches.      Allergies  Penicillins; Sulfa antibiotics; Tramadol; Ciprofloxacin; and Trimethoprim  Home Medications   Prior to Admission medications   Medication Sig Start Date End Date Taking? Authorizing Provider  acetaminophen (TYLENOL) 325 MG tablet Take 650 mg by mouth every 4 (four) hours as needed for mild pain, moderate pain or fever. pain 01/26/16  Yes Historical Provider, MD  albuterol (PROVENTIL HFA;VENTOLIN HFA) 108 (90 Base) MCG/ACT inhaler Inhale 2 puffs into the lungs every 6 (six) hours as needed for wheezing or shortness of  breath.  01/03/16 01/02/17 Yes Historical Provider, MD  atorvastatin (LIPITOR) 20 MG tablet Take 20 mg by mouth daily.   Yes Historical Provider, MD  bisacodyl (DULCOLAX) 10 MG suppository Place 10 mg rectally daily as needed for mild constipation or moderate constipation.  01/26/16 02/05/16 Yes Historical Provider, MD  carvedilol (COREG) 12.5 MG tablet Take 12.5 mg by mouth 2 (two) times daily. 11/05/15  Yes Historical Provider, MD  cetirizine (ZYRTEC) 10 MG tablet Take 10 mg by mouth daily. 11/23/15  Yes Historical Provider, MD  Cholecalciferol (VITAMIN D) 2000 UNITS CAPS Take 2,000 Units by mouth daily.    Yes Historical Provider,  MD  ciclopirox (PENLAC) 8 % solution Apply topically at bedtime. Apply over nail and surrounding skin. Apply daily over previous coat. After seven (7) days, may remove with alcohol and continue cycle. 02/18/13  Yes Myeong O Sheard, DPM  esomeprazole (NEXIUM) 40 MG capsule Take 40 mg by mouth daily before breakfast.   Yes Historical Provider, MD  fluticasone (FLONASE) 50 MCG/ACT nasal spray Place 1 spray into both nostrils daily.  01/03/16  Yes Historical Provider, MD  HYDROcodone-acetaminophen (NORCO/VICODIN) 5-325 MG tablet Take 1-2 tablets by mouth every 6 (six) hours as needed for moderate pain or severe pain.  01/26/16  Yes Historical Provider, MD  losartan (COZAAR) 100 MG tablet Take 100 mg by mouth daily.   Yes Historical Provider, MD  montelukast (SINGULAIR) 10 MG tablet Take 10 mg by mouth at bedtime. 09/30/15  Yes Historical Provider, MD  Multiple Vitamin (MULTIVITAMIN) tablet Take 1 tablet by mouth daily.   Yes Historical Provider, MD  doxycycline (VIBRAMYCIN) 100 MG capsule Take 1 capsule (100 mg total) by mouth 2 (two) times daily. Patient not taking: Reported on 02/01/2016 11/19/15   Melony Overly, MD  ipratropium (ATROVENT HFA) 17 MCG/ACT inhaler Inhale 2 puffs into the lungs every 4 (four) hours as needed (cough). Patient not taking: Reported on 02/01/2016 11/19/15   Melony Overly, MD  oseltamivir (TAMIFLU) 75 MG capsule Take 1 capsule (75 mg total) by mouth every 12 (twelve) hours. Patient not taking: Reported on 02/01/2016 11/19/15   Melony Overly, MD  Rivaroxaban 15 & 20 MG TBPK Take 15 mg by mouth 2 (two) times daily before a meal. Take as directed on package: Start with one 15mg  tablet by mouth twice a day with food. On Day 22, switch to one 20mg  tablet once a day with food. 02/01/16   Gareth Morgan, MD   BP 126/72 mmHg  Pulse 72  Temp(Src) 98.1 F (36.7 C) (Oral)  Resp 19  SpO2 100% Physical Exam  Constitutional: She is oriented to person, place, and time. She appears well-developed and  well-nourished. No distress.  HENT:  Head: Normocephalic and atraumatic.  Eyes: Conjunctivae and EOM are normal.  Neck: Normal range of motion.  Cardiovascular: Normal rate, regular rhythm, normal heart sounds and intact distal pulses.  Exam reveals no gallop and no friction rub.   No murmur heard. Pulmonary/Chest: Effort normal and breath sounds normal. No respiratory distress. She has no wheezes. She has no rales.  Abdominal: Soft. She exhibits no distension. There is no tenderness. There is no guarding.  Musculoskeletal: She exhibits tenderness (proximal left leg, incision to left knee c/d/i, no surrounding erythema, mild swelling, no swelling lower ext). She exhibits no edema.  Neurological: She is alert and oriented to person, place, and time.  Skin: Skin is warm and dry. No rash noted. She is not diaphoretic. No  erythema.  Nursing note and vitals reviewed.   ED Course  Procedures (including critical care time) Labs Review Labs Reviewed  CBC WITH DIFFERENTIAL/PLATELET - Abnormal; Notable for the following:    Hemoglobin 11.6 (*)    HCT 34.6 (*)    All other components within normal limits  COMPREHENSIVE METABOLIC PANEL - Abnormal; Notable for the following:    Glucose, Bld 126 (*)    BUN 32 (*)    Calcium 8.8 (*)    Total Protein 6.1 (*)    Albumin 3.4 (*)    All other components within normal limits  BRAIN NATRIURETIC PEPTIDE - Abnormal; Notable for the following:    B Natriuretic Peptide 150.0 (*)    All other components within normal limits  I-STAT TROPOININ, ED  I-STAT TROPOININ, ED    Imaging Review Ct Angio Chest Pe W/cm &/or Wo Cm  02/01/2016  CLINICAL DATA:  Recent knee surgery and syncope. EXAM: CT ANGIOGRAPHY CHEST WITH CONTRAST TECHNIQUE: Multidetector CT imaging of the chest was performed using the standard protocol during bolus administration of intravenous contrast. Multiplanar CT image reconstructions and MIPs were obtained to evaluate the vascular anatomy.  CONTRAST:  100 mL Isovue 370 COMPARISON:  Chest CT 02/22/2012 FINDINGS: Mediastinum/Lymph Nodes: Negative for pulmonary embolism. Normal caliber of the thoracic aorta. There are coronary artery calcifications. No suspicious chest lymphadenopathy. Small amount of fluid in the superior pericardial recess. Thyroid tissue is unremarkable. Lungs/Pleura: The trachea and mainstem bronchi are patent. Stable 5 mm nodule in the right lower lobe on sequence 9, image 48. This is unchanged since 2013 and likely benign. Patchy ground-glass densities in both lungs may represent edema. There is a subtle 4 mm nodule in the left lower lobe on sequence 9, image 54 which is stable. There is a subtle nodule in the right upper lobe best seen on the coronal reformats, sequence 10, image 74. This was present on the prior examination. Upper abdomen: Gallbladder has been removed. No acute abnormalities in the upper abdomen. Musculoskeletal: No acute bone abnormality. Review of the MIP images confirms the above findings. IMPRESSION: Negative for pulmonary embolism. Ground-glass densities in both lungs suggest mild pulmonary edema. No large areas of lung consolidation. Again noted are small pulmonary nodules which have minimally changed since 2013 and likely benign. Coronary artery calcifications. Electronically Signed   By: Markus Daft M.D.   On: 02/01/2016 13:41   I have personally reviewed and evaluated these images and lab results as part of my medical decision-making.   EKG Interpretation   Date/Time:  Wednesday Feb 01 2016 12:18:32 EDT Ventricular Rate:  71 PR Interval:  190 QRS Duration: 100 QT Interval:  384 QTC Calculation: 417 R Axis:   42 Text Interpretation:  Sinus rhythm Low voltage, precordial leads No  significant change since last tracing Confirmed by [facility] MD, Bjorn Hallas  ([id number]) on 02/01/2016 3:01:13 PM      MDM   Final diagnoses:  Lower extremity pain, right  Syncope, unspecified syncope type  DVT (deep  venous thrombosis), right   65 year old female with a history of hypertension, dyslipidemia, diastolic dysfunction, knee replacement on May 1, presents with concern of syncope and hypertension while doing physical therapy today.   EKG evaluated by me and shows sinus rhythm with no sign of prolonged QTc, no brugada, no sign of HOCM, no ST abnormalities.  CBC shows no signs of anemia, electrolytes were within normal limits. Given recent knee surgery, patient is high risk for pulmonary embolus, and  CT angiogram was performed which showed no sign of PE. Patient does report low by mouth intake, as well as history of other syncopal episodes in setting pain and feel this episode was likely vagal in setting of pain and dehydration. Blood pressures improved shortly after arrival to ED.  Pt with leg pain worse from prior knee replacement. DVT US done showing RLE DVT in soleus vein.  Given xarelto and training via pharmacist. Discussed risks and benefits. Discussed with Orthopedic surgeon who will follow up with pt tomorrow. Patient discharged in stable condition with understanding of reasons to return.   Gareth Morgan, MD 02/01/16 2252

## 2016-02-01 NOTE — ED Notes (Signed)
Discharge instructions, follow up care, and rx x1 reviewed with patient. Patient verbalized understanding. 

## 2016-02-01 NOTE — Progress Notes (Addendum)
*  PRELIMINARY RESULTS* Vascular Ultrasound Right lower extremity venous duplex has been completed.  Preliminary findings: DVT noted in the right soleal veins.   Gave results to Gareth Morgan, MD   Landry Mellow, RDMS, RVT  02/01/2016, 2:57 PM

## 2016-02-01 NOTE — Discharge Instructions (Signed)
Information on my medicine - XARELTO (rivaroxaban)  This medication education was reviewed with me or my healthcare representative as part of my discharge preparation.  The pharmacist that spoke with me during my hospital stay was:  Hershal Coria, [place]? Xarelto was prescribed to treat blood clots that may have been found in the veins of your legs (deep vein thrombosis) or in your lungs (pulmonary embolism) and to reduce the risk of them occurring again.  What do you need to know about Xarelto? The starting dose is one 15 mg tablet taken TWICE daily with food for the FIRST 21 DAYS then on (enter date)  02/22/16  the dose is changed to one 20 mg tablet taken ONCE A DAY with your evening meal.  DO NOT stop taking Xarelto without talking to the health care provider who prescribed the medication.  Refill your prescription for 20 mg tablets before you run out.  After discharge, you should have regular check-up appointments with your healthcare provider that is prescribing your Xarelto.  In the future your dose may need to be changed if your kidney function changes by a significant amount.  What do you do if you miss a dose? If you are taking Xarelto TWICE DAILY and you miss a dose, take it as soon as you remember. You may take two 15 mg tablets (total 30 mg) at the same time then resume your regularly scheduled 15 mg twice daily the next day.  If you are taking Xarelto ONCE DAILY and you miss a dose, take it as soon as you remember on the same day then continue your regularly scheduled once daily regimen the next day. Do not take two doses of Xarelto at the same time.   Important Safety Information Xarelto is a blood thinner medicine that can cause bleeding. You should call your healthcare provider right away if you experience any of the following: ? Bleeding from an injury or your nose that does not stop. ? Unusual colored urine (red or dark brown) or  unusual colored stools (red or black). ? Unusual bruising for unknown reasons. ? A serious fall or if you hit your head (even if there is no bleeding).  Some medicines may interact with Xarelto and might increase your risk of bleeding while on Xarelto. To help avoid this, consult your healthcare provider or pharmacist prior to using any new prescription or non-prescription medications, including herbals, vitamins, non-steroidal anti-inflammatory drugs (NSAIDs) and supplements.  This website has more information on Xarelto: [email or web address]

## 2016-02-03 DIAGNOSIS — Z471 Aftercare following joint replacement surgery: Secondary | ICD-10-CM | POA: Diagnosis not present

## 2016-02-03 DIAGNOSIS — I10 Essential (primary) hypertension: Secondary | ICD-10-CM | POA: Diagnosis not present

## 2016-02-06 DIAGNOSIS — I10 Essential (primary) hypertension: Secondary | ICD-10-CM | POA: Diagnosis not present

## 2016-02-06 DIAGNOSIS — Z471 Aftercare following joint replacement surgery: Secondary | ICD-10-CM | POA: Diagnosis not present

## 2016-02-09 DIAGNOSIS — I10 Essential (primary) hypertension: Secondary | ICD-10-CM | POA: Diagnosis not present

## 2016-02-09 DIAGNOSIS — Z471 Aftercare following joint replacement surgery: Secondary | ICD-10-CM | POA: Diagnosis not present

## 2016-02-10 DIAGNOSIS — Z7901 Long term (current) use of anticoagulants: Secondary | ICD-10-CM | POA: Diagnosis not present

## 2016-02-10 DIAGNOSIS — I10 Essential (primary) hypertension: Secondary | ICD-10-CM | POA: Diagnosis not present

## 2016-02-10 DIAGNOSIS — I824Z1 Acute embolism and thrombosis of unspecified deep veins of right distal lower extremity: Secondary | ICD-10-CM | POA: Diagnosis not present

## 2016-02-10 DIAGNOSIS — Z96651 Presence of right artificial knee joint: Secondary | ICD-10-CM | POA: Diagnosis not present

## 2016-02-13 DIAGNOSIS — Z471 Aftercare following joint replacement surgery: Secondary | ICD-10-CM | POA: Diagnosis not present

## 2016-02-13 DIAGNOSIS — I10 Essential (primary) hypertension: Secondary | ICD-10-CM | POA: Diagnosis not present

## 2016-02-15 DIAGNOSIS — I10 Essential (primary) hypertension: Secondary | ICD-10-CM | POA: Diagnosis not present

## 2016-02-15 DIAGNOSIS — Z471 Aftercare following joint replacement surgery: Secondary | ICD-10-CM | POA: Diagnosis not present

## 2016-02-17 DIAGNOSIS — M25561 Pain in right knee: Secondary | ICD-10-CM | POA: Diagnosis not present

## 2016-02-21 DIAGNOSIS — M25561 Pain in right knee: Secondary | ICD-10-CM | POA: Diagnosis not present

## 2016-02-23 DIAGNOSIS — M25561 Pain in right knee: Secondary | ICD-10-CM | POA: Diagnosis not present

## 2016-02-27 DIAGNOSIS — M25561 Pain in right knee: Secondary | ICD-10-CM | POA: Diagnosis not present

## 2016-02-28 DIAGNOSIS — Z96651 Presence of right artificial knee joint: Secondary | ICD-10-CM | POA: Diagnosis not present

## 2016-02-28 DIAGNOSIS — M25561 Pain in right knee: Secondary | ICD-10-CM | POA: Diagnosis not present

## 2016-03-01 DIAGNOSIS — M25561 Pain in right knee: Secondary | ICD-10-CM | POA: Diagnosis not present

## 2016-03-05 DIAGNOSIS — M25561 Pain in right knee: Secondary | ICD-10-CM | POA: Diagnosis not present

## 2016-03-08 DIAGNOSIS — M25561 Pain in right knee: Secondary | ICD-10-CM | POA: Diagnosis not present

## 2016-03-12 DIAGNOSIS — M25561 Pain in right knee: Secondary | ICD-10-CM | POA: Diagnosis not present

## 2016-03-16 DIAGNOSIS — M25561 Pain in right knee: Secondary | ICD-10-CM | POA: Diagnosis not present

## 2016-03-19 ENCOUNTER — Telehealth: Payer: Self-pay | Admitting: Internal Medicine

## 2016-03-19 NOTE — Telephone Encounter (Signed)
Pt calling regarding being put on Xarelto some time in May, went to ER pt was found to have a blood clot in her leg-pt has questions regarding the blood thinner -pls advise [phone]

## 2016-03-19 NOTE — Telephone Encounter (Signed)
Follow-up      The pt is returning the nurses call, no other information

## 2016-03-19 NOTE — Telephone Encounter (Signed)
Called  - bad connection ask to call back

## 2016-03-19 NOTE — Telephone Encounter (Signed)
SPOKE TO PATIENT PATIENT WANTED TO KNOW WHAT PROTOCOL FOR DVT - [place] PATIENT HAD KNEE SURGERY ON 01/23/16 AND 02/01/16 DIAGNOSIS WITH SMALL DVT . PATIENT STATES SHE HAD AN APPOINTMENT AND SPOKE WITH PRIMARY DR ANDY. PER PATIENT,(TO STAY WITH XARELTO FOR 3 MONTHS AND THEN STOP)  RN INFORMED PATIENT THAT DR ANDY CAN TREAT SYMPTOMS, PATIENT WOULD LIKE TO DISCUSS WITH IN CARDIOLOGY. SHE STATES SHE SOMEONE SEVERAL YEARS AGO. APPOINTMENT ARRANGED FOR AUG  [phone]

## 2016-03-19 NOTE — Telephone Encounter (Signed)
PATIENT HAD ANOTHER QUESTION PATIENT WILL BE TRAVELING  IN THE NEAR FUTURE @400  MILES PATIENT WANTED TO KNOW IF IT WILL BE OKAY.  RN REVIEWED WITH D.O.D----OKAY TO TRAVEL , GET OUT AN WALK EVERY COUPLE HOURS .  VERBALIZED UNDERSTANDING.

## 2016-03-20 DIAGNOSIS — M25561 Pain in right knee: Secondary | ICD-10-CM | POA: Diagnosis not present

## 2016-03-22 DIAGNOSIS — M25561 Pain in right knee: Secondary | ICD-10-CM | POA: Diagnosis not present

## 2016-03-30 DIAGNOSIS — M25561 Pain in right knee: Secondary | ICD-10-CM | POA: Diagnosis not present

## 2016-04-02 DIAGNOSIS — M25561 Pain in right knee: Secondary | ICD-10-CM | POA: Diagnosis not present

## 2016-04-06 ENCOUNTER — Other Ambulatory Visit (HOSPITAL_COMMUNITY): Payer: Self-pay | Admitting: Sports Medicine

## 2016-04-06 DIAGNOSIS — I82401 Acute embolism and thrombosis of unspecified deep veins of right lower extremity: Secondary | ICD-10-CM

## 2016-04-06 DIAGNOSIS — Z96651 Presence of right artificial knee joint: Secondary | ICD-10-CM

## 2016-04-11 ENCOUNTER — Ambulatory Visit (HOSPITAL_COMMUNITY): Payer: Medicare Other

## 2016-04-11 ENCOUNTER — Ambulatory Visit (HOSPITAL_COMMUNITY)
Admission: RE | Admit: 2016-04-11 | Discharge: 2016-04-11 | Disposition: A | Discharge status: Home / Self Care | Payer: Medicare Other | Source: Ambulatory Visit | Attending: Sports Medicine | Admitting: Sports Medicine

## 2016-04-11 DIAGNOSIS — K219 Gastro-esophageal reflux disease without esophagitis: Secondary | ICD-10-CM | POA: Insufficient documentation

## 2016-04-11 DIAGNOSIS — F419 Anxiety disorder, unspecified: Secondary | ICD-10-CM | POA: Insufficient documentation

## 2016-04-11 DIAGNOSIS — I82401 Acute embolism and thrombosis of unspecified deep veins of right lower extremity: Secondary | ICD-10-CM

## 2016-04-11 DIAGNOSIS — Z96651 Presence of right artificial knee joint: Secondary | ICD-10-CM | POA: Diagnosis not present

## 2016-04-11 DIAGNOSIS — I10 Essential (primary) hypertension: Secondary | ICD-10-CM | POA: Insufficient documentation

## 2016-04-11 DIAGNOSIS — E7800 Pure hypercholesterolemia, unspecified: Secondary | ICD-10-CM | POA: Diagnosis not present

## 2016-04-11 NOTE — Progress Notes (Signed)
Preliminary results by tech - Right Lower Ext. Venous Duplex Completed. Negative for deep and superficial vein thrombosis and Baker's cyst in the right leg.  There is resolution of previous deep vein thrombosis  in the soleal veins that was noted on 02/01/16 sturdy. Results given to [place], [place].  Oda Cogan, BS, RDMS, RVT

## 2016-04-16 DIAGNOSIS — Z96651 Presence of right artificial knee joint: Secondary | ICD-10-CM | POA: Diagnosis not present

## 2016-04-16 DIAGNOSIS — Z471 Aftercare following joint replacement surgery: Secondary | ICD-10-CM | POA: Diagnosis not present

## 2016-04-19 DIAGNOSIS — Z471 Aftercare following joint replacement surgery: Secondary | ICD-10-CM | POA: Diagnosis not present

## 2016-04-19 DIAGNOSIS — Z96651 Presence of right artificial knee joint: Secondary | ICD-10-CM | POA: Diagnosis not present

## 2016-04-20 DIAGNOSIS — H40013 Open angle with borderline findings, low risk, bilateral: Secondary | ICD-10-CM | POA: Diagnosis not present

## 2016-04-23 DIAGNOSIS — Z471 Aftercare following joint replacement surgery: Secondary | ICD-10-CM | POA: Diagnosis not present

## 2016-04-23 DIAGNOSIS — Z96651 Presence of right artificial knee joint: Secondary | ICD-10-CM | POA: Diagnosis not present

## 2016-04-26 DIAGNOSIS — Z471 Aftercare following joint replacement surgery: Secondary | ICD-10-CM | POA: Diagnosis not present

## 2016-04-26 DIAGNOSIS — Z96651 Presence of right artificial knee joint: Secondary | ICD-10-CM | POA: Diagnosis not present

## 2016-04-30 DIAGNOSIS — Z471 Aftercare following joint replacement surgery: Secondary | ICD-10-CM | POA: Diagnosis not present

## 2016-04-30 DIAGNOSIS — Z96651 Presence of right artificial knee joint: Secondary | ICD-10-CM | POA: Diagnosis not present

## 2016-05-02 ENCOUNTER — Ambulatory Visit: Payer: Medicare Other | Admitting: Internal Medicine

## 2016-05-04 DIAGNOSIS — Z96651 Presence of right artificial knee joint: Secondary | ICD-10-CM | POA: Diagnosis not present

## 2016-05-04 DIAGNOSIS — Z471 Aftercare following joint replacement surgery: Secondary | ICD-10-CM | POA: Diagnosis not present

## 2016-05-08 DIAGNOSIS — Z471 Aftercare following joint replacement surgery: Secondary | ICD-10-CM | POA: Diagnosis not present

## 2016-05-08 DIAGNOSIS — Z96651 Presence of right artificial knee joint: Secondary | ICD-10-CM | POA: Diagnosis not present

## 2016-05-10 DIAGNOSIS — Z96651 Presence of right artificial knee joint: Secondary | ICD-10-CM | POA: Diagnosis not present

## 2016-05-10 DIAGNOSIS — Z471 Aftercare following joint replacement surgery: Secondary | ICD-10-CM | POA: Diagnosis not present

## 2016-05-17 DIAGNOSIS — Z96651 Presence of right artificial knee joint: Secondary | ICD-10-CM | POA: Diagnosis not present

## 2016-05-17 DIAGNOSIS — Z471 Aftercare following joint replacement surgery: Secondary | ICD-10-CM | POA: Diagnosis not present

## 2016-05-22 DIAGNOSIS — Z96651 Presence of right artificial knee joint: Secondary | ICD-10-CM | POA: Diagnosis not present

## 2016-05-23 DIAGNOSIS — Z23 Encounter for immunization: Secondary | ICD-10-CM | POA: Diagnosis not present

## 2016-05-31 DIAGNOSIS — Z471 Aftercare following joint replacement surgery: Secondary | ICD-10-CM | POA: Diagnosis not present

## 2016-05-31 DIAGNOSIS — Z96651 Presence of right artificial knee joint: Secondary | ICD-10-CM | POA: Diagnosis not present

## 2016-06-07 DIAGNOSIS — Z96651 Presence of right artificial knee joint: Secondary | ICD-10-CM | POA: Diagnosis not present

## 2016-06-07 DIAGNOSIS — Z471 Aftercare following joint replacement surgery: Secondary | ICD-10-CM | POA: Diagnosis not present

## 2016-06-12 DIAGNOSIS — Z1211 Encounter for screening for malignant neoplasm of colon: Secondary | ICD-10-CM | POA: Diagnosis not present

## 2016-06-12 DIAGNOSIS — K219 Gastro-esophageal reflux disease without esophagitis: Secondary | ICD-10-CM | POA: Diagnosis not present

## 2016-06-12 DIAGNOSIS — Z86010 Personal history of colonic polyps: Secondary | ICD-10-CM | POA: Diagnosis not present

## 2016-07-03 DIAGNOSIS — Z96651 Presence of right artificial knee joint: Secondary | ICD-10-CM | POA: Diagnosis not present

## 2016-07-12 DIAGNOSIS — Z471 Aftercare following joint replacement surgery: Secondary | ICD-10-CM | POA: Diagnosis not present

## 2016-07-12 DIAGNOSIS — Z96651 Presence of right artificial knee joint: Secondary | ICD-10-CM | POA: Diagnosis not present

## 2016-08-03 DIAGNOSIS — D125 Benign neoplasm of sigmoid colon: Secondary | ICD-10-CM | POA: Diagnosis not present

## 2016-08-03 DIAGNOSIS — D122 Benign neoplasm of ascending colon: Secondary | ICD-10-CM | POA: Diagnosis not present

## 2016-08-03 DIAGNOSIS — D123 Benign neoplasm of transverse colon: Secondary | ICD-10-CM | POA: Diagnosis not present

## 2016-08-03 DIAGNOSIS — K635 Polyp of colon: Secondary | ICD-10-CM | POA: Diagnosis not present

## 2016-08-03 DIAGNOSIS — Z1211 Encounter for screening for malignant neoplasm of colon: Secondary | ICD-10-CM | POA: Diagnosis not present

## 2016-08-03 LAB — HM COLONOSCOPY

## 2016-08-07 DIAGNOSIS — I10 Essential (primary) hypertension: Secondary | ICD-10-CM | POA: Diagnosis not present

## 2016-08-07 DIAGNOSIS — Z96651 Presence of right artificial knee joint: Secondary | ICD-10-CM | POA: Diagnosis not present

## 2016-08-22 DIAGNOSIS — M25561 Pain in right knee: Secondary | ICD-10-CM | POA: Diagnosis not present

## 2016-08-22 DIAGNOSIS — I10 Essential (primary) hypertension: Secondary | ICD-10-CM | POA: Diagnosis not present

## 2016-08-29 DIAGNOSIS — R937 Abnormal findings on diagnostic imaging of other parts of musculoskeletal system: Secondary | ICD-10-CM | POA: Diagnosis not present

## 2016-08-29 DIAGNOSIS — Z96651 Presence of right artificial knee joint: Secondary | ICD-10-CM | POA: Diagnosis not present

## 2016-09-04 DIAGNOSIS — I10 Essential (primary) hypertension: Secondary | ICD-10-CM | POA: Diagnosis not present

## 2016-09-04 DIAGNOSIS — Z96651 Presence of right artificial knee joint: Secondary | ICD-10-CM | POA: Diagnosis not present

## 2016-09-06 DIAGNOSIS — Z96651 Presence of right artificial knee joint: Secondary | ICD-10-CM | POA: Diagnosis not present

## 2016-09-26 DIAGNOSIS — J069 Acute upper respiratory infection, unspecified: Secondary | ICD-10-CM | POA: Diagnosis not present

## 2016-10-15 DIAGNOSIS — R600 Localized edema: Secondary | ICD-10-CM | POA: Diagnosis not present

## 2016-10-15 DIAGNOSIS — R748 Abnormal levels of other serum enzymes: Secondary | ICD-10-CM | POA: Diagnosis not present

## 2016-10-15 DIAGNOSIS — M8580 Other specified disorders of bone density and structure, unspecified site: Secondary | ICD-10-CM | POA: Diagnosis not present

## 2016-10-15 DIAGNOSIS — E782 Mixed hyperlipidemia: Secondary | ICD-10-CM | POA: Diagnosis not present

## 2016-10-15 DIAGNOSIS — I10 Essential (primary) hypertension: Secondary | ICD-10-CM | POA: Diagnosis not present

## 2016-10-15 DIAGNOSIS — Z1159 Encounter for screening for other viral diseases: Secondary | ICD-10-CM | POA: Diagnosis not present

## 2016-10-16 DIAGNOSIS — I10 Essential (primary) hypertension: Secondary | ICD-10-CM | POA: Diagnosis not present

## 2016-10-16 DIAGNOSIS — Z96651 Presence of right artificial knee joint: Secondary | ICD-10-CM | POA: Diagnosis not present

## 2016-10-16 LAB — HM HEPATITIS C SCREENING LAB: HM HEPATITIS C SCREENING: NEGATIVE

## 2016-10-29 DIAGNOSIS — Z23 Encounter for immunization: Secondary | ICD-10-CM | POA: Diagnosis not present

## 2016-11-09 DIAGNOSIS — M25561 Pain in right knee: Secondary | ICD-10-CM | POA: Diagnosis not present

## 2016-11-09 DIAGNOSIS — Z96651 Presence of right artificial knee joint: Secondary | ICD-10-CM | POA: Diagnosis not present

## 2016-11-09 DIAGNOSIS — Z09 Encounter for follow-up examination after completed treatment for conditions other than malignant neoplasm: Secondary | ICD-10-CM | POA: Diagnosis not present

## 2016-11-13 DIAGNOSIS — M25561 Pain in right knee: Secondary | ICD-10-CM | POA: Diagnosis not present

## 2016-11-19 DIAGNOSIS — M8589 Other specified disorders of bone density and structure, multiple sites: Secondary | ICD-10-CM | POA: Diagnosis not present

## 2016-11-19 DIAGNOSIS — Z1231 Encounter for screening mammogram for malignant neoplasm of breast: Secondary | ICD-10-CM | POA: Diagnosis not present

## 2016-11-19 DIAGNOSIS — Z780 Asymptomatic menopausal state: Secondary | ICD-10-CM | POA: Diagnosis not present

## 2016-11-20 DIAGNOSIS — M25561 Pain in right knee: Secondary | ICD-10-CM | POA: Diagnosis not present

## 2016-11-23 DIAGNOSIS — M25561 Pain in right knee: Secondary | ICD-10-CM | POA: Diagnosis not present

## 2016-11-28 DIAGNOSIS — M25561 Pain in right knee: Secondary | ICD-10-CM | POA: Diagnosis not present

## 2016-12-04 DIAGNOSIS — M25561 Pain in right knee: Secondary | ICD-10-CM | POA: Diagnosis not present

## 2016-12-07 DIAGNOSIS — M25561 Pain in right knee: Secondary | ICD-10-CM | POA: Diagnosis not present

## 2016-12-11 DIAGNOSIS — M25561 Pain in right knee: Secondary | ICD-10-CM | POA: Diagnosis not present

## 2016-12-14 DIAGNOSIS — M25561 Pain in right knee: Secondary | ICD-10-CM | POA: Diagnosis not present

## 2016-12-17 DIAGNOSIS — M25561 Pain in right knee: Secondary | ICD-10-CM | POA: Diagnosis not present

## 2016-12-19 DIAGNOSIS — M25561 Pain in right knee: Secondary | ICD-10-CM | POA: Diagnosis not present

## 2016-12-25 DIAGNOSIS — M25561 Pain in right knee: Secondary | ICD-10-CM | POA: Diagnosis not present

## 2016-12-31 DIAGNOSIS — M25561 Pain in right knee: Secondary | ICD-10-CM | POA: Diagnosis not present

## 2017-01-04 DIAGNOSIS — M25562 Pain in left knee: Secondary | ICD-10-CM | POA: Diagnosis not present

## 2017-01-04 DIAGNOSIS — Z96653 Presence of artificial knee joint, bilateral: Secondary | ICD-10-CM | POA: Diagnosis not present

## 2017-01-04 DIAGNOSIS — M25561 Pain in right knee: Secondary | ICD-10-CM | POA: Diagnosis not present

## 2017-02-05 DIAGNOSIS — Z96651 Presence of right artificial knee joint: Secondary | ICD-10-CM | POA: Diagnosis not present

## 2017-02-05 DIAGNOSIS — I10 Essential (primary) hypertension: Secondary | ICD-10-CM | POA: Diagnosis not present

## 2017-02-12 DIAGNOSIS — Z96651 Presence of right artificial knee joint: Secondary | ICD-10-CM | POA: Diagnosis not present

## 2017-02-12 DIAGNOSIS — Z471 Aftercare following joint replacement surgery: Secondary | ICD-10-CM | POA: Diagnosis not present

## 2017-04-08 DIAGNOSIS — M7542 Impingement syndrome of left shoulder: Secondary | ICD-10-CM | POA: Diagnosis not present

## 2017-04-08 DIAGNOSIS — M150 Primary generalized (osteo)arthritis: Secondary | ICD-10-CM | POA: Diagnosis not present

## 2017-04-08 DIAGNOSIS — K219 Gastro-esophageal reflux disease without esophagitis: Secondary | ICD-10-CM | POA: Diagnosis not present

## 2017-04-08 DIAGNOSIS — I10 Essential (primary) hypertension: Secondary | ICD-10-CM | POA: Diagnosis not present

## 2017-04-09 IMAGING — CT CT ANGIO CHEST
2 of 6 series · 18 of 36 positions shown · IV contrast (ISOVUE 370)
Comparison: Chest CT 02/22/2012

CLINICAL DATA: Recent knee surgery and syncope.

EXAM:
CT ANGIOGRAPHY CHEST WITH CONTRAST
TECHNIQUE: Multidetector CT imaging of the chest was performed using the
standard protocol during bolus administration of intravenous
contrast. Multiplanar CT image reconstructions and MIPs were
obtained to evaluate the vascular anatomy.
CONTRAST:  100 mL Isovue 370

[Series 8: thins for pacs · axial · 0.74mm/px · z∈[-220,-2]mm · 17 of 244 slices shown]
[im 13/244  lung]
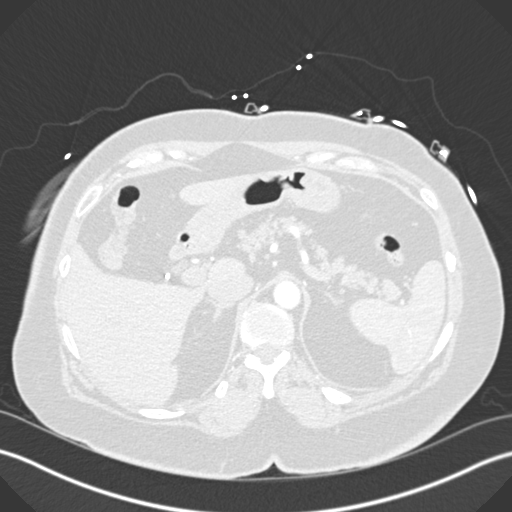
[im 25/244  mediastinal]
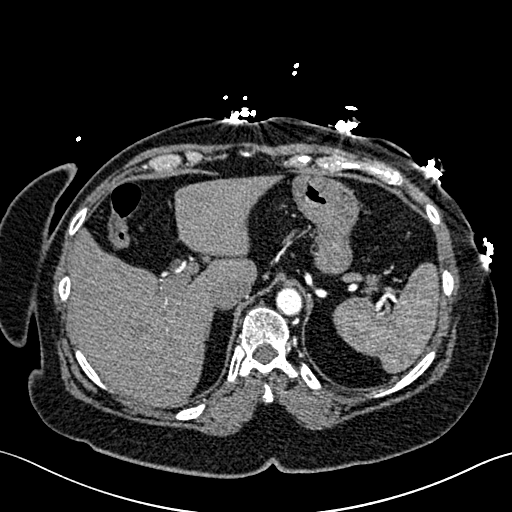
[im 37/244  lung]
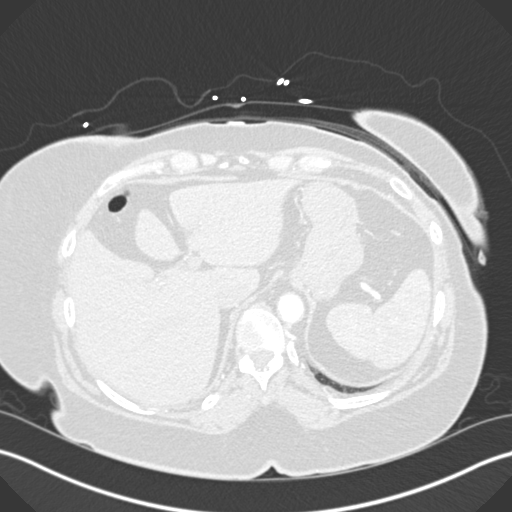
[im 49/244  mediastinal]
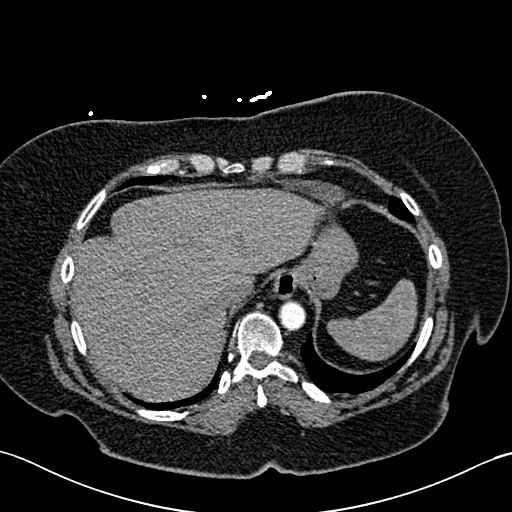
[im 73/244  lung]
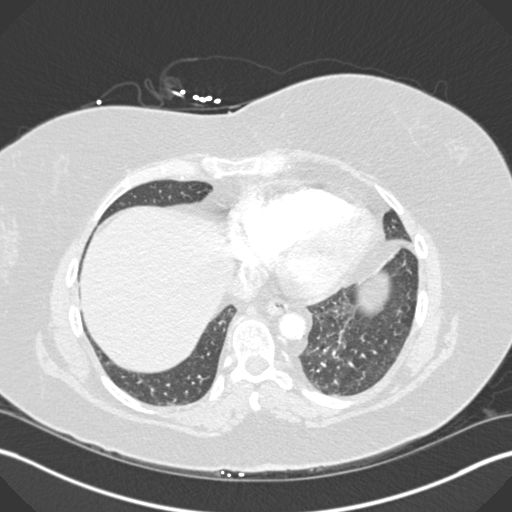
[im 86/244  mediastinal]
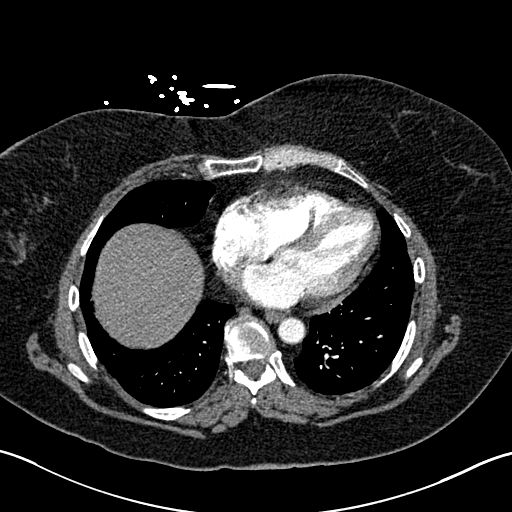
[im 98/244  lung]
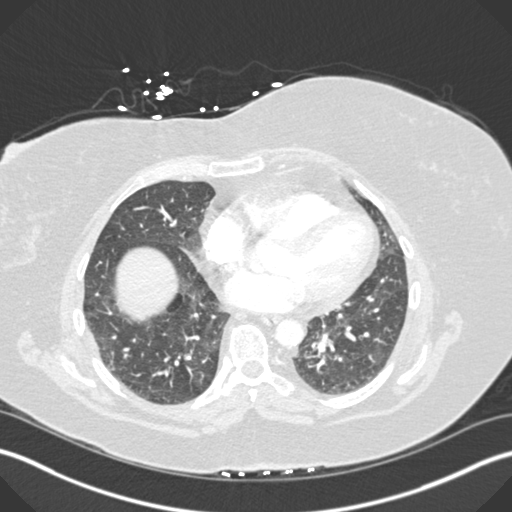
[im 110/244  mediastinal]
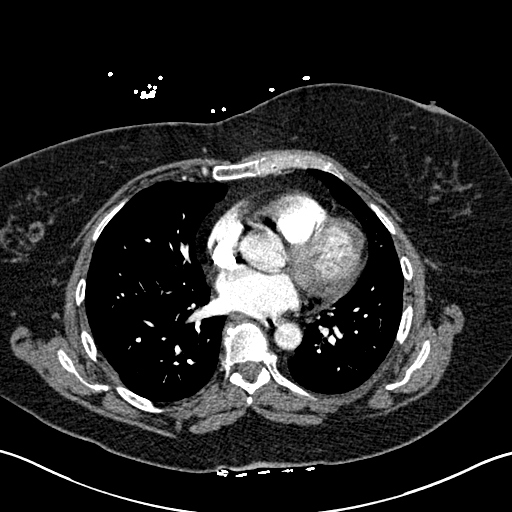
[im 122/244  lung]
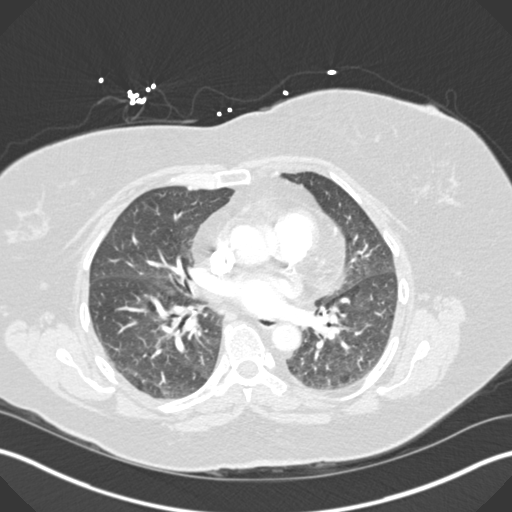
[im 134/244  mediastinal]
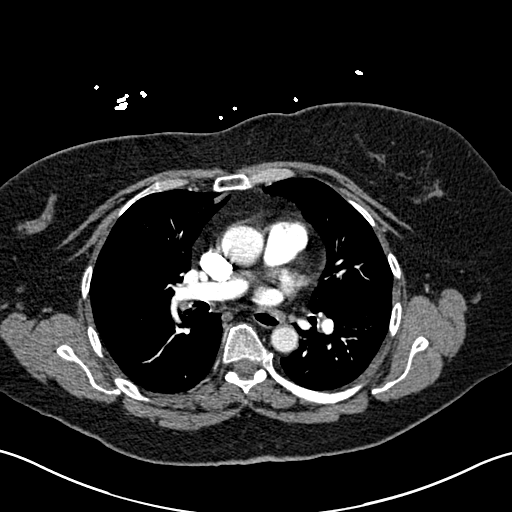
[im 146/244  lung]
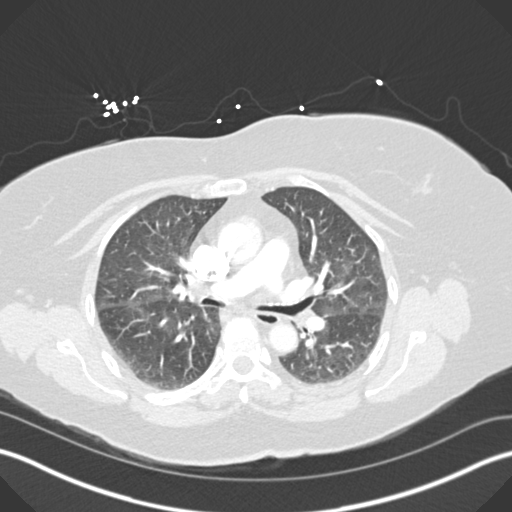
[im 158/244  mediastinal]
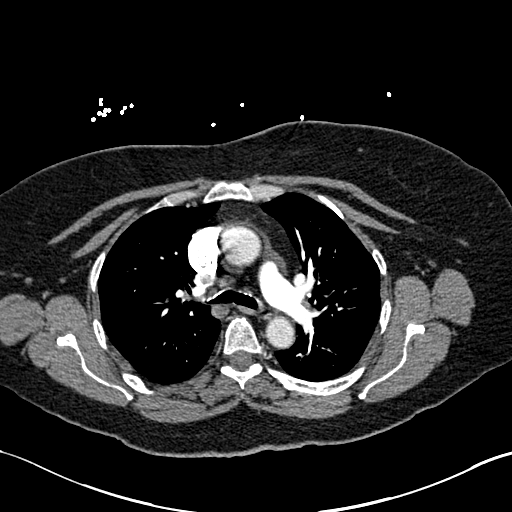
[im 171/244  lung]
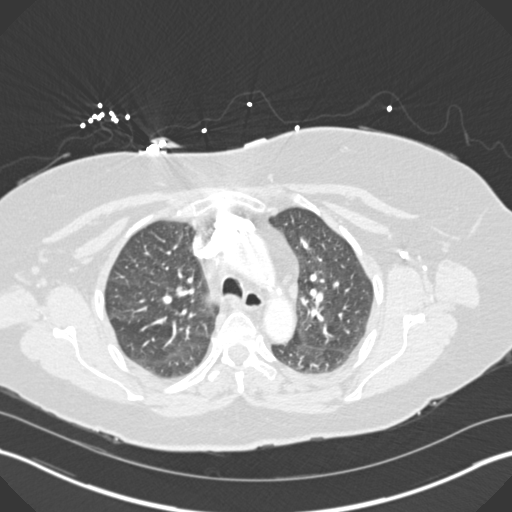
[im 195/244  mediastinal]
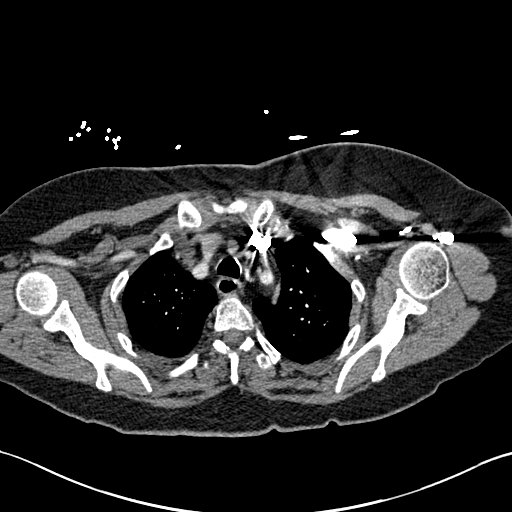
[im 207/244  lung]
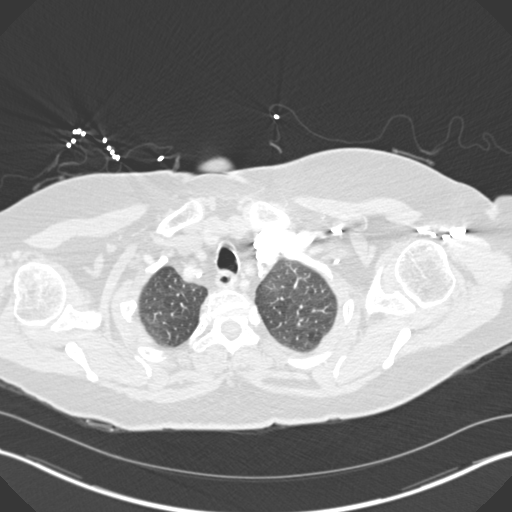
[im 219/244  mediastinal]
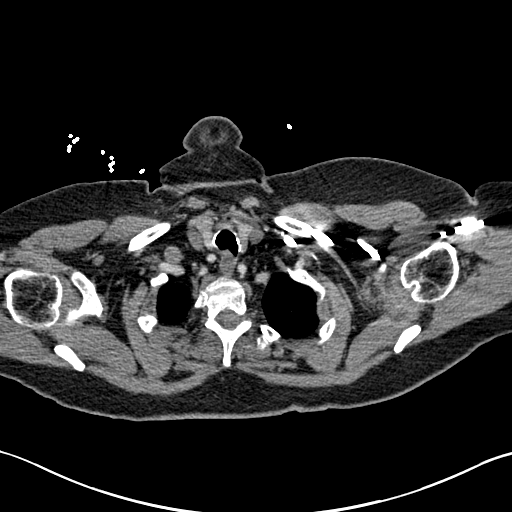
[im 231/244  lung]
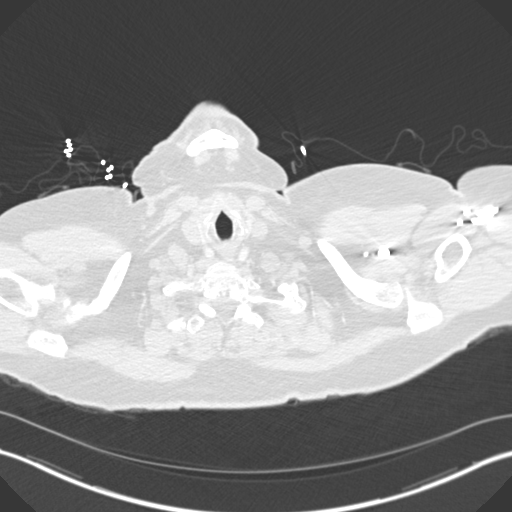

[Series 10: coronal mpr · coronal · 0.48mm/px · 1 of 125 slices shown]
[im 63/125  mediastinal]
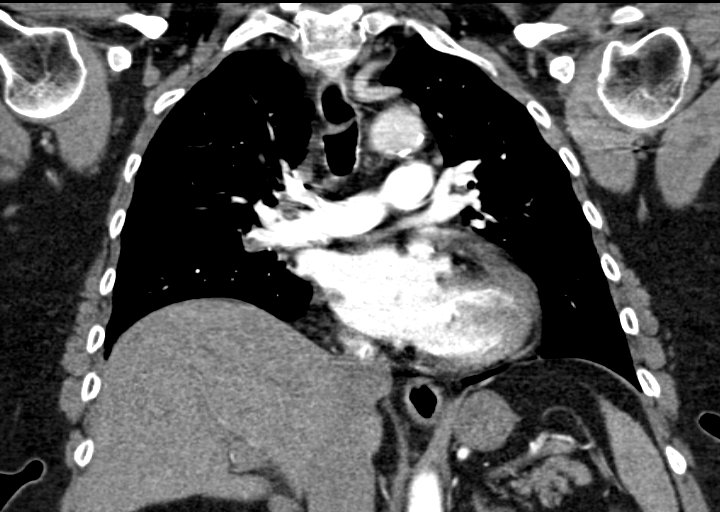

[18 of 36 positions shown; findings below may reference images not displayed]

FINDINGS: Mediastinum/Lymph Nodes: Negative for pulmonary embolism. Normal
caliber of the thoracic aorta. There are coronary artery
calcifications. No suspicious chest lymphadenopathy. Small amount of
fluid in the superior pericardial recess. Thyroid tissue is
unremarkable.

Lungs/Pleura: The trachea and mainstem bronchi are patent. Stable 5
mm nodule in the right lower lobe on sequence 9, image 48. This is
unchanged since 4754 and likely benign. Patchy ground-glass
densities in both lungs may represent edema. There is a subtle 4 mm
nodule in the left lower lobe on sequence 9, image 54 which is
stable. There is a subtle nodule in the right upper lobe best seen
on the coronal reformats, sequence 10, image 74. This was present on
the prior examination.

Upper abdomen: Gallbladder has been removed. No acute abnormalities
in the upper abdomen.

Musculoskeletal: No acute bone abnormality.

Review of the MIP images confirms the above findings.
IMPRESSION: Negative for pulmonary embolism.

Ground-glass densities in both lungs suggest mild pulmonary edema.
No large areas of lung consolidation.

Again noted are small pulmonary nodules which have minimally changed
since 4754 and likely benign.

Coronary artery calcifications.

## 2017-04-17 DIAGNOSIS — M19012 Primary osteoarthritis, left shoulder: Secondary | ICD-10-CM | POA: Diagnosis not present

## 2017-04-17 DIAGNOSIS — M7522 Bicipital tendinitis, left shoulder: Secondary | ICD-10-CM | POA: Diagnosis not present

## 2017-04-17 DIAGNOSIS — M79602 Pain in left arm: Secondary | ICD-10-CM | POA: Diagnosis not present

## 2017-04-17 DIAGNOSIS — I10 Essential (primary) hypertension: Secondary | ICD-10-CM | POA: Diagnosis not present

## 2017-05-01 DIAGNOSIS — M7522 Bicipital tendinitis, left shoulder: Secondary | ICD-10-CM | POA: Diagnosis not present

## 2017-05-01 DIAGNOSIS — I10 Essential (primary) hypertension: Secondary | ICD-10-CM | POA: Diagnosis not present

## 2017-05-01 DIAGNOSIS — M19012 Primary osteoarthritis, left shoulder: Secondary | ICD-10-CM | POA: Diagnosis not present

## 2017-06-05 DIAGNOSIS — M79641 Pain in right hand: Secondary | ICD-10-CM | POA: Diagnosis not present

## 2017-06-05 DIAGNOSIS — M180 Bilateral primary osteoarthritis of first carpometacarpal joints: Secondary | ICD-10-CM | POA: Diagnosis not present

## 2017-06-05 DIAGNOSIS — M79642 Pain in left hand: Secondary | ICD-10-CM | POA: Diagnosis not present

## 2017-06-05 DIAGNOSIS — M19041 Primary osteoarthritis, right hand: Secondary | ICD-10-CM | POA: Diagnosis not present

## 2017-06-05 DIAGNOSIS — M19031 Primary osteoarthritis, right wrist: Secondary | ICD-10-CM | POA: Diagnosis not present

## 2017-06-05 DIAGNOSIS — M19032 Primary osteoarthritis, left wrist: Secondary | ICD-10-CM | POA: Diagnosis not present

## 2017-06-05 DIAGNOSIS — M19042 Primary osteoarthritis, left hand: Secondary | ICD-10-CM | POA: Diagnosis not present

## 2017-06-11 DIAGNOSIS — M19032 Primary osteoarthritis, left wrist: Secondary | ICD-10-CM | POA: Diagnosis not present

## 2017-06-11 DIAGNOSIS — M79641 Pain in right hand: Secondary | ICD-10-CM | POA: Diagnosis not present

## 2017-06-11 DIAGNOSIS — M180 Bilateral primary osteoarthritis of first carpometacarpal joints: Secondary | ICD-10-CM | POA: Diagnosis not present

## 2017-06-11 DIAGNOSIS — M19041 Primary osteoarthritis, right hand: Secondary | ICD-10-CM | POA: Diagnosis not present

## 2017-06-11 DIAGNOSIS — M79642 Pain in left hand: Secondary | ICD-10-CM | POA: Diagnosis not present

## 2017-06-11 DIAGNOSIS — M19042 Primary osteoarthritis, left hand: Secondary | ICD-10-CM | POA: Diagnosis not present

## 2017-06-11 DIAGNOSIS — M19031 Primary osteoarthritis, right wrist: Secondary | ICD-10-CM | POA: Diagnosis not present

## 2017-06-12 DIAGNOSIS — S46811A Strain of other muscles, fascia and tendons at shoulder and upper arm level, right arm, initial encounter: Secondary | ICD-10-CM | POA: Diagnosis not present

## 2017-06-12 DIAGNOSIS — R030 Elevated blood-pressure reading, without diagnosis of hypertension: Secondary | ICD-10-CM | POA: Diagnosis not present

## 2017-06-12 DIAGNOSIS — I10 Essential (primary) hypertension: Secondary | ICD-10-CM | POA: Diagnosis not present

## 2017-07-05 DIAGNOSIS — J40 Bronchitis, not specified as acute or chronic: Secondary | ICD-10-CM | POA: Diagnosis not present

## 2017-07-05 DIAGNOSIS — J329 Chronic sinusitis, unspecified: Secondary | ICD-10-CM | POA: Diagnosis not present

## 2017-07-05 DIAGNOSIS — I10 Essential (primary) hypertension: Secondary | ICD-10-CM | POA: Diagnosis not present

## 2017-07-05 DIAGNOSIS — R05 Cough: Secondary | ICD-10-CM | POA: Diagnosis not present

## 2017-07-08 DIAGNOSIS — I10 Essential (primary) hypertension: Secondary | ICD-10-CM | POA: Diagnosis not present

## 2017-07-08 DIAGNOSIS — R05 Cough: Secondary | ICD-10-CM | POA: Diagnosis not present

## 2017-07-08 DIAGNOSIS — J40 Bronchitis, not specified as acute or chronic: Secondary | ICD-10-CM | POA: Diagnosis not present

## 2017-07-24 DIAGNOSIS — R05 Cough: Secondary | ICD-10-CM | POA: Diagnosis not present

## 2017-07-24 DIAGNOSIS — E669 Obesity, unspecified: Secondary | ICD-10-CM | POA: Diagnosis not present

## 2017-07-24 DIAGNOSIS — R053 Chronic cough: Secondary | ICD-10-CM | POA: Insufficient documentation

## 2017-07-24 DIAGNOSIS — I10 Essential (primary) hypertension: Secondary | ICD-10-CM | POA: Diagnosis not present

## 2017-07-31 DIAGNOSIS — M180 Bilateral primary osteoarthritis of first carpometacarpal joints: Secondary | ICD-10-CM | POA: Diagnosis not present

## 2017-08-02 DIAGNOSIS — D485 Neoplasm of uncertain behavior of skin: Secondary | ICD-10-CM | POA: Diagnosis not present

## 2017-08-02 DIAGNOSIS — L821 Other seborrheic keratosis: Secondary | ICD-10-CM | POA: Diagnosis not present

## 2017-08-06 DIAGNOSIS — Z79899 Other long term (current) drug therapy: Secondary | ICD-10-CM | POA: Diagnosis not present

## 2017-08-06 DIAGNOSIS — R0602 Shortness of breath: Secondary | ICD-10-CM | POA: Diagnosis not present

## 2017-08-06 DIAGNOSIS — R635 Abnormal weight gain: Secondary | ICD-10-CM | POA: Diagnosis not present

## 2017-08-06 DIAGNOSIS — R5383 Other fatigue: Secondary | ICD-10-CM | POA: Diagnosis not present

## 2017-08-11 DIAGNOSIS — D229 Melanocytic nevi, unspecified: Secondary | ICD-10-CM | POA: Diagnosis not present

## 2017-08-19 DIAGNOSIS — L918 Other hypertrophic disorders of the skin: Secondary | ICD-10-CM | POA: Diagnosis not present

## 2017-08-19 DIAGNOSIS — L821 Other seborrheic keratosis: Secondary | ICD-10-CM | POA: Diagnosis not present

## 2017-08-19 DIAGNOSIS — D225 Melanocytic nevi of trunk: Secondary | ICD-10-CM | POA: Diagnosis not present

## 2017-08-21 DIAGNOSIS — J9801 Acute bronchospasm: Secondary | ICD-10-CM | POA: Diagnosis not present

## 2017-08-21 DIAGNOSIS — I10 Essential (primary) hypertension: Secondary | ICD-10-CM | POA: Diagnosis not present

## 2017-08-21 DIAGNOSIS — R05 Cough: Secondary | ICD-10-CM | POA: Diagnosis not present

## 2017-08-21 DIAGNOSIS — H1031 Unspecified acute conjunctivitis, right eye: Secondary | ICD-10-CM | POA: Diagnosis not present

## 2017-08-21 DIAGNOSIS — J069 Acute upper respiratory infection, unspecified: Secondary | ICD-10-CM | POA: Diagnosis not present

## 2017-08-23 DIAGNOSIS — R509 Fever, unspecified: Secondary | ICD-10-CM | POA: Diagnosis not present

## 2017-08-23 DIAGNOSIS — I10 Essential (primary) hypertension: Secondary | ICD-10-CM | POA: Diagnosis not present

## 2017-08-23 DIAGNOSIS — R05 Cough: Secondary | ICD-10-CM | POA: Diagnosis not present

## 2017-08-26 DIAGNOSIS — R0981 Nasal congestion: Secondary | ICD-10-CM | POA: Diagnosis not present

## 2017-08-26 DIAGNOSIS — I10 Essential (primary) hypertension: Secondary | ICD-10-CM | POA: Diagnosis not present

## 2017-08-26 DIAGNOSIS — J208 Acute bronchitis due to other specified organisms: Secondary | ICD-10-CM | POA: Diagnosis not present

## 2017-09-09 DIAGNOSIS — R05 Cough: Secondary | ICD-10-CM | POA: Diagnosis not present

## 2017-09-09 DIAGNOSIS — K219 Gastro-esophageal reflux disease without esophagitis: Secondary | ICD-10-CM | POA: Insufficient documentation

## 2017-09-09 DIAGNOSIS — J324 Chronic pansinusitis: Secondary | ICD-10-CM | POA: Insufficient documentation

## 2017-09-09 DIAGNOSIS — J329 Chronic sinusitis, unspecified: Secondary | ICD-10-CM | POA: Diagnosis not present

## 2017-09-27 DIAGNOSIS — R05 Cough: Secondary | ICD-10-CM | POA: Diagnosis not present

## 2017-10-01 DIAGNOSIS — I10 Essential (primary) hypertension: Secondary | ICD-10-CM | POA: Diagnosis not present

## 2017-10-01 DIAGNOSIS — E669 Obesity, unspecified: Secondary | ICD-10-CM | POA: Insufficient documentation

## 2017-10-01 DIAGNOSIS — R05 Cough: Secondary | ICD-10-CM | POA: Diagnosis not present

## 2017-10-01 DIAGNOSIS — K219 Gastro-esophageal reflux disease without esophagitis: Secondary | ICD-10-CM | POA: Diagnosis not present

## 2017-10-01 DIAGNOSIS — Z6834 Body mass index (BMI) 34.0-34.9, adult: Secondary | ICD-10-CM | POA: Diagnosis not present

## 2017-10-16 DIAGNOSIS — I10 Essential (primary) hypertension: Secondary | ICD-10-CM | POA: Diagnosis not present

## 2017-10-16 DIAGNOSIS — Z0000 Encounter for general adult medical examination without abnormal findings: Secondary | ICD-10-CM | POA: Diagnosis not present

## 2017-10-16 DIAGNOSIS — E669 Obesity, unspecified: Secondary | ICD-10-CM | POA: Diagnosis not present

## 2017-10-16 DIAGNOSIS — K219 Gastro-esophageal reflux disease without esophagitis: Secondary | ICD-10-CM | POA: Diagnosis not present

## 2017-10-16 DIAGNOSIS — E785 Hyperlipidemia, unspecified: Secondary | ICD-10-CM | POA: Diagnosis not present

## 2017-10-16 DIAGNOSIS — R05 Cough: Secondary | ICD-10-CM | POA: Diagnosis not present

## 2017-10-16 LAB — HEPATIC FUNCTION PANEL
ALT: 37 — AB (ref 7–35)
AST: 27 (ref 13–35)
Bilirubin, Total: 0.6

## 2017-10-16 LAB — CBC AND DIFFERENTIAL
HEMATOCRIT: 41 (ref 36–46)
Hemoglobin: 13.9 (ref 12.0–16.0)
PLATELETS: 232 (ref 150–399)
WBC: 4

## 2017-10-16 LAB — BASIC METABOLIC PANEL
BUN: 15 (ref 4–21)
Creatinine: 0.9 (ref <=1.1)
Glucose: 97
Potassium: 4.4 (ref 3.4–5.3)
Sodium: 144 (ref 137–147)

## 2017-10-16 LAB — LIPID PANEL
CHOLESTEROL: 124 (ref 0–200)
HDL: 49 (ref 35–70)
LDL CALC: 61
TRIGLYCERIDES: 69 (ref 40–160)

## 2017-11-19 DIAGNOSIS — E669 Obesity, unspecified: Secondary | ICD-10-CM | POA: Diagnosis not present

## 2017-11-19 DIAGNOSIS — K219 Gastro-esophageal reflux disease without esophagitis: Secondary | ICD-10-CM | POA: Diagnosis not present

## 2017-11-19 DIAGNOSIS — R05 Cough: Secondary | ICD-10-CM | POA: Diagnosis not present

## 2017-11-20 ENCOUNTER — Other Ambulatory Visit: Payer: Self-pay | Admitting: Gastroenterology

## 2017-11-20 DIAGNOSIS — R1310 Dysphagia, unspecified: Secondary | ICD-10-CM

## 2017-11-20 DIAGNOSIS — Z1231 Encounter for screening mammogram for malignant neoplasm of breast: Secondary | ICD-10-CM | POA: Diagnosis not present

## 2017-11-20 LAB — HM MAMMOGRAPHY

## 2017-11-21 ENCOUNTER — Encounter: Payer: Self-pay | Admitting: Emergency Medicine

## 2017-11-26 ENCOUNTER — Ambulatory Visit: Payer: Medicare Other | Admitting: Family Medicine

## 2017-11-26 ENCOUNTER — Ambulatory Visit (HOSPITAL_COMMUNITY)
Admission: RE | Admit: 2017-11-26 | Discharge: 2017-11-26 | Disposition: A | Discharge status: Home / Self Care | Payer: Medicare Other | Source: Ambulatory Visit | Attending: Gastroenterology | Admitting: Gastroenterology

## 2017-11-26 DIAGNOSIS — R1310 Dysphagia, unspecified: Secondary | ICD-10-CM | POA: Diagnosis not present

## 2017-12-04 ENCOUNTER — Ambulatory Visit (INDEPENDENT_AMBULATORY_CARE_PROVIDER_SITE_OTHER): Payer: Medicare Other | Admitting: Family Medicine

## 2017-12-04 ENCOUNTER — Encounter: Payer: Self-pay | Admitting: Family Medicine

## 2017-12-04 ENCOUNTER — Encounter: Payer: Self-pay | Admitting: Emergency Medicine

## 2017-12-04 ENCOUNTER — Other Ambulatory Visit: Payer: Self-pay

## 2017-12-04 VITALS — BP 128/70 | HR 70 | Temp 98.7°F | Ht 63.0 in | Wt 194.2 lb

## 2017-12-04 DIAGNOSIS — M150 Primary generalized (osteo)arthritis: Secondary | ICD-10-CM | POA: Diagnosis not present

## 2017-12-04 DIAGNOSIS — J302 Other seasonal allergic rhinitis: Secondary | ICD-10-CM | POA: Diagnosis not present

## 2017-12-04 DIAGNOSIS — M8949 Other hypertrophic osteoarthropathy, multiple sites: Secondary | ICD-10-CM

## 2017-12-04 DIAGNOSIS — I10 Essential (primary) hypertension: Secondary | ICD-10-CM | POA: Diagnosis not present

## 2017-12-04 DIAGNOSIS — K5900 Constipation, unspecified: Secondary | ICD-10-CM

## 2017-12-04 DIAGNOSIS — K219 Gastro-esophageal reflux disease without esophagitis: Secondary | ICD-10-CM | POA: Diagnosis not present

## 2017-12-04 DIAGNOSIS — R05 Cough: Secondary | ICD-10-CM

## 2017-12-04 DIAGNOSIS — M159 Polyosteoarthritis, unspecified: Secondary | ICD-10-CM

## 2017-12-04 DIAGNOSIS — R053 Chronic cough: Secondary | ICD-10-CM

## 2017-12-04 DIAGNOSIS — E782 Mixed hyperlipidemia: Secondary | ICD-10-CM | POA: Insufficient documentation

## 2017-12-04 MED ORDER — AZELASTINE HCL 0.1 % NA SOLN: 2.0000 | Freq: Two times a day (BID) | NASAL | 12 refills | Status: DC

## 2017-12-04 MED ORDER — LEVOCETIRIZINE DIHYDROCHLORIDE 5 MG PO TABS: 5.0000 mg | ORAL_TABLET | Freq: Every evening | ORAL | 11 refills | Status: DC

## 2017-12-04 MED ORDER — TRIAMCINOLONE ACETONIDE 55 MCG/ACT NA AERO: 2.0000 | INHALATION_SPRAY | Freq: Every day | NASAL | 12 refills | Status: DC

## 2017-12-04 NOTE — Patient Instructions (Signed)
It was so good seeing you again! Thank you for establishing with my new practice and allowing me to continue caring for you. It means a lot to me.   Start colace and miralax daily for your constipation.  Star the new allergy medications; stop the old ones.   Increase your coreg to 2tabs twice a day.   Please schedule a follow up appointment with me in 6 weeks to recheck on your blood pressure and cough/allergies.   Constipation, Adult Constipation is when a person has fewer bowel movements in a week than normal, has difficulty having a bowel movement, or has stools that are dry, hard, or larger than normal. Constipation may be caused by an underlying condition. It may become worse with age if a person takes certain medicines and does not take in enough fluids. Follow these instructions at home: Eating and drinking   Eat foods that have a lot of fiber, such as fresh fruits and vegetables, whole grains, and beans.  Limit foods that are high in fat, low in fiber, or overly processed, such as french fries, hamburgers, cookies, candies, and soda.  Drink enough fluid to keep your urine clear or pale yellow. General instructions  Exercise regularly or as told by your health care provider.  Go to the restroom when you have the urge to go. Do not hold it in.  Take over-the-counter and prescription medicines only as told by your health care provider. These include any fiber supplements.  Practice pelvic floor retraining exercises, such as deep breathing while relaxing the lower abdomen and pelvic floor relaxation during bowel movements.  Watch your condition for any changes.  Keep all follow-up visits as told by your health care provider. This is important. Contact a health care provider if:  You have pain that gets worse.  You have a fever.  You do not have a bowel movement after 4 days.  You vomit.  You are not hungry.  You lose weight.  You are bleeding from the anus.  You  have thin, pencil-like stools. Get help right away if:  You have a fever and your symptoms suddenly get worse.  You leak stool or have blood in your stool.  Your abdomen is bloated.  You have severe pain in your abdomen.  You feel dizzy or you faint. This information is not intended to replace advice given to you by your health care provider. Make sure you discuss any questions you have with your health care provider. Document Released: 06/08/2004 Document Revised: 03/30/2016 Document Reviewed: 02/29/2016 Elsevier Interactive Patient Education  2018 [employer].

## 2017-12-04 NOTE — Progress Notes (Signed)
Subjective  CC:  Chief Complaint  Patient presents with  . Establish Care    Transfer from [place]  . Cough    Patient states that she has had a cough since September     HPI: Erin West is a 67 y.o. female who presents to [place] at [facility] today to establish care with me as a new patient. She is a former [place] patient and is here to reestablish care with me today.    She has the following concerns or needs:  67 year old female who had her complete physical and annual wellness visit in January with Dr. Doreene Nest.  I reviewed that note and all lab results.  Overall she does well with a few complaints as follows:  Persistent cough.  Ongoing for almost a year now.  Has seen ENT, pulmonology, gastroenterology without any clear diagnosis.  She reports worsening of cough in the morning with a.m. hoarseness and drainage.  She does have allergies.  She has seen an allergist many many years ago.  On Flonase and Zyrtec nightly.  CT scan of the sinuses was negative for infection.  PFTs were normal.  Chest x-ray was normal.  She is on chronic high-dose PPI and has been evaluated for esophageal dysmotility by Dr. Collene Mares.  They have not found a cause for her persistent cough.  She is on losartan.  Hypertension has been well controlled on losartan and Coreg.  No chest pain or shortness of breath.  Recent lab work was normal for electrolytes and renal function.  She has been seeing the hand surgeon for hand arthritis, status post steroid injection with mild relief of symptoms.  Complains of recent constipation over the last several weeks.  Hard small stools that are difficult to pass.  No blood in the stool.  No melena.  Colon cancer risk is average and her screening is up-to-date.  She does not take any medications for this.  We updated and reviewed the patient's past history in detail and it is documented below.  Patient Active Problem List   Diagnosis Date Noted  . Mixed  hyperlipidemia 12/04/2017  . Chronic seasonal allergic rhinitis 12/04/2017  . Obesity, Class I, BMI 30-34.9 10/01/2017  . Chronic pansinusitis 09/09/2017  . Laryngopharyngeal reflux (LPR) 09/09/2017  . Persistent cough 07/24/2017  . Status post total right knee replacement 01/24/2016  . Bilateral leg edema 03/30/2014  . History of gastritis 02/10/2014  . Dysphagia 01/28/2014  . Irritable bowel syndrome (IBS) 09/08/2013  . Osteopenia 07/14/2013  . Osteoarthritis, multiple sites 07/14/2013  . Essential hypertension   . Gastroesophageal reflux disease without esophagitis    Health Maintenance  Topic Date Due  . PNA vac Low Risk Adult (2 of 2 - PPSV23) 10/29/2017  . MAMMOGRAM  11/20/2018  . TETANUS/TDAP  12/18/2025  . COLONOSCOPY  08/03/2026  . INFLUENZA VACCINE  Completed  . DEXA SCAN  Completed  . Hepatitis C Screening  Completed   Immunization History  Administered Date(s) Administered  . Influenza Split 06/24/2013  . Influenza, High Dose Seasonal PF 05/23/2016, 06/24/2017  . Influenza, Seasonal, Injecte, Preservative Fre 05/27/2014, 07/11/2015  . Pneumococcal Conjugate-13 10/29/2016  . Pneumococcal Polysaccharide-23 07/25/2012  . Tdap 09/24/2010, 12/19/2015  . Zoster 09/25/2011   Current Meds  Medication Sig  . acetaminophen (TYLENOL) 325 MG tablet Take 650 mg by mouth every 4 (four) hours as needed for mild pain, moderate pain or fever. pain  . albuterol (PROVENTIL HFA;VENTOLIN HFA) 108 (90 Base)  MCG/ACT inhaler Inhale 2 puffs into the lungs every 6 (six) hours as needed for wheezing or shortness of breath.   Marland Kitchen atorvastatin (LIPITOR) 20 MG tablet Take 20 mg by mouth daily.  Marland Kitchen BLACK ELDERBERRY PO Take by mouth.  . carvedilol (COREG) 12.5 MG tablet Take 25 mg by mouth 2 (two) times daily.  . Cholecalciferol (VITAMIN D) 2000 UNITS CAPS Take 2,000 Units by mouth daily.   Marland Kitchen linaclotide (LINZESS) 145 MCG CAPS capsule   . Multiple Vitamin (MULTIVITAMIN) tablet Take 1 tablet by  mouth daily.  Marland Kitchen omeprazole (PRILOSEC) 20 MG capsule Take by mouth.  . Probiotic Product (PROBIOTIC PO) Take by mouth.  . [DISCONTINUED] cetirizine (ZYRTEC) 10 MG tablet Take 10 mg by mouth daily.  . [DISCONTINUED] fluticasone (FLONASE) 50 MCG/ACT nasal spray Place 1 spray into both nostrils daily.   . [DISCONTINUED] losartan (COZAAR) 100 MG tablet Take 100 mg by mouth daily.    Allergies: Patient is allergic to penicillins; sulfa antibiotics; sulfasalazine; tramadol; ciprofloxacin; and trimethoprim. Past Medical History Patient  has a past medical history of Anxiety, Arthritis, Aspiration pneumonia ([place]) (2013), Atypical chest pain, Diarrhea, Diastolic dysfunction, DOE (dyspnea on exertion), DVT (deep venous thrombosis) ([place]), Dyslipidemia, Fatigue, GERD (gastroesophageal reflux disease), High cholesterol, Hypertension, Knee pain, Osteoarthritis, Osteopenia, Palpitations, SOB (shortness of breath), and Syncope. Past Surgical History Patient  has a past surgical history that includes Cholecystectomy (2006); Tubal ligation (06/1978); Replacement total knee (Left); and Knee arthroscopy (Right). Family History: Patient family history includes Arthritis in her father and mother; Heart disease in her mother; Mental illness in her father; Osteoporosis in her mother; Other in her mother; Stroke in her paternal grandfather; Sudden death in her father. Social History:  Patient  reports that  has never smoked. she has never used smokeless tobacco. She reports that she drinks alcohol. She reports that she does not use drugs.  Review of Systems: Constitutional: negative for fever or malaise Ophthalmic: negative for photophobia, double vision or loss of vision Cardiovascular: negative for chest pain, dyspnea on exertion, or new LE swelling Respiratory: negative for SOB or persistent cough Gastrointestinal: positive for abdominal pain, change in bowel habits no melena Genitourinary: negative for dysuria or  gross hematuria Musculoskeletal: negative for new gait disturbance or muscular weakness Integumentary: negative for new or persistent rashes Neurological: negative for TIA or stroke symptoms Psychiatric: negative for SI or delusions Allergic/Immunologic: negative for hives  Patient Care Team    Relationship Specialty Notifications Start End  Leamon Arnt, MD PCP - General Family Medicine  03/19/16   Donzetta Sprung., MD Consulting Physician Orthopedic Surgery  12/04/17   Daryll Brod, MD Consulting Physician Orthopedic Surgery  12/04/17   Juanita Craver, MD Consulting Physician Gastroenterology  12/04/17     Objective  Vitals: BP 128/70   Pulse 70   Temp 98.7 F (37.1 C)   Ht 5\' 3"  (1.6 m)   Wt 194 lb 3.2 oz (88.1 kg)   BMI 34.40 kg/m  General:  Well developed, well nourished, no acute distress  Psych:  Alert and oriented,normal mood and affect HEENT:  Normocephalic, atraumatic, non-icteric sclera, PERRL, oropharynx is without mass or exudate, nasal mucosa is minimally inflamed, drainage is present.  Supple neck without adenopathy, mass or thyromegaly Cardiovascular:  RRR without gallop, rub or murmur, nondisplaced PMI Respiratory:  Good breath sounds bilaterally, CTAB with normal respiratory effort Gastrointestinal: normal bowel sounds, soft, non-tender, no noted masses. No HSM MSK: no deformities, contusions. Joints are without erythema  or swelling Skin:  Warm, no rashes or suspicious lesions noted Neurologic:    Mental status is normal. Gross motor and sensory exams are normal. Normal gait  Assessment  1. Essential hypertension   2. Gastroesophageal reflux disease without esophagitis   3. Persistent cough   4. Primary osteoarthritis involving multiple joints   5. Laryngopharyngeal reflux (LPR)   6. Chronic seasonal allergic rhinitis   7. Constipation, unspecified constipation type      Plan   Hypertension, good control however given chronic cough we will stop losartan and  increase Coreg to 25 twice daily.  Recheck in 6 weeks.  Chronic cough, see if losartan is contributing to cough.  Continue PPI.  Change allergy medicines to see if this will help as well.  Consider allergy referral.  Treat constipation with MiraLAX and Colace daily.  Education given.  Continue chronic PPI  Health maintenance is up-to-date.  She is due her last Pneumovax however she reports she thinks she got this at [facility].  She will check her records and we will readdress at next visit in 6 weeks  Follow up: 6 weeks to recheck blood pressure, cough, constipation  Commons side effects, risks, benefits, and alternatives for medications and treatment plan prescribed today were discussed, and the patient expressed understanding of the given instructions. Patient is instructed to call or message via MyChart if he/she has any questions or concerns regarding our treatment plan. No barriers to understanding were identified. We discussed Red Flag symptoms and signs in detail. Patient expressed understanding regarding what to do in case of urgent or emergency type symptoms.   Medication list was reconciled, printed and provided to the patient in AVS. Patient instructions and summary information was reviewed with the patient as documented in the AVS. This note was prepared with assistance of Dragon voice recognition software. Occasional wrong-word or sound-a-like substitutions may have occurred due to the inherent limitations of voice recognition software  Orders Placed This Encounter  Procedures  . HM PAP SMEAR  . HM COLONOSCOPY   Meds ordered this encounter  Medications  . levocetirizine (XYZAL) 5 MG tablet    Sig: Take 1 tablet (5 mg total) by mouth every evening.    Dispense:  30 tablet    Refill:  11  . azelastine (ASTELIN) 0.1 % nasal spray    Sig: Place 2 sprays into both nostrils 2 (two) times daily. Use in each nostril as directed    Dispense:  30 mL    Refill:  12  . triamcinolone  (NASACORT) 55 MCG/ACT AERO nasal inhaler    Sig: Place 2 sprays into the nose daily.    Dispense:  1 Inhaler    Refill:  12

## 2017-12-09 DIAGNOSIS — M180 Bilateral primary osteoarthritis of first carpometacarpal joints: Secondary | ICD-10-CM | POA: Diagnosis not present

## 2017-12-13 ENCOUNTER — Ambulatory Visit (INDEPENDENT_AMBULATORY_CARE_PROVIDER_SITE_OTHER): Payer: Medicare Other | Admitting: Family Medicine

## 2017-12-13 ENCOUNTER — Other Ambulatory Visit: Payer: Self-pay

## 2017-12-13 ENCOUNTER — Encounter: Payer: Self-pay | Admitting: Family Medicine

## 2017-12-13 VITALS — BP 130/84 | HR 67 | Temp 98.4°F | Ht 63.0 in | Wt 196.0 lb

## 2017-12-13 DIAGNOSIS — M545 Low back pain, unspecified: Secondary | ICD-10-CM

## 2017-12-13 LAB — POCT URINALYSIS DIPSTICK
BILIRUBIN UA: NEGATIVE
Blood, UA: NEGATIVE
KETONES UA: NEGATIVE
NITRITE UA: NEGATIVE
PH UA: 6 (ref 5.0–8.0)
Protein, UA: NEGATIVE
SPEC GRAV UA: 1.025 (ref 1.010–1.025)
UROBILINOGEN UA: 0.2 U/dL

## 2017-12-13 MED ORDER — POLYETHYLENE GLYCOL 3350 17 GM/SCOOP PO POWD: 17.0000 g | Freq: Every day | ORAL | 1 refills | Status: AC | PRN

## 2017-12-13 NOTE — Patient Instructions (Signed)
Please follow up if symptoms do not improve or as needed.   Take tylenol or alleve to see if that helps the pain.

## 2017-12-13 NOTE — Progress Notes (Signed)
Subjective  CC:  Chief Complaint  Patient presents with  . Back Pain    x 5 days     HPI: Erin West is a 67 y.o. female who presents to the office today to address the problems listed above in the chief complaint.  4-5 days of left mid back pain; mild to moderate, dull ache, no radicular sxs or trauma or rash. Has h/o lower lumbar pain but never in this region. No renal colic or gross hematuria, or dysuria or f/c/ or GI sxs. Hasn't needed any pain medication. Nothing makes it betteror worse. Sleep is unaffected. Wants ot be sure it is not her kidneys.    I reviewed the patients updated PMH, FH, and SocHx.    Patient Active Problem List   Diagnosis Date Noted  . Mixed hyperlipidemia 12/04/2017  . Chronic seasonal allergic rhinitis 12/04/2017  . Obesity, Class I, BMI 30-34.9 10/01/2017  . Chronic pansinusitis 09/09/2017  . Laryngopharyngeal reflux (LPR) 09/09/2017  . Persistent cough 07/24/2017  . Status post total right knee replacement 01/24/2016  . Bilateral leg edema 03/30/2014  . History of gastritis 02/10/2014  . Dysphagia 01/28/2014  . Irritable bowel syndrome (IBS) 09/08/2013  . Osteopenia 07/14/2013  . Osteoarthritis, multiple sites 07/14/2013  . Essential hypertension   . Gastroesophageal reflux disease without esophagitis    Current Meds  Medication Sig  . acetaminophen (TYLENOL) 325 MG tablet Take 650 mg by mouth every 4 (four) hours as needed for mild pain, moderate pain or fever. pain  . atorvastatin (LIPITOR) 20 MG tablet Take 20 mg by mouth daily.  Marland Kitchen azelastine (ASTELIN) 0.1 % nasal spray Place 2 sprays into both nostrils 2 (two) times daily. Use in each nostril as directed  . BLACK ELDERBERRY PO Take by mouth.  . carvedilol (COREG) 12.5 MG tablet Take 25 mg by mouth 2 (two) times daily.  . Cholecalciferol (VITAMIN D) 2000 UNITS CAPS Take 2,000 Units by mouth daily.   Marland Kitchen levocetirizine (XYZAL) 5 MG tablet Take 1 tablet (5 mg total) by mouth every evening.   . linaclotide (LINZESS) 145 MCG CAPS capsule   . Multiple Vitamin (MULTIVITAMIN) tablet Take 1 tablet by mouth daily.  Marland Kitchen omeprazole (PRILOSEC) 20 MG capsule Take by mouth.  . Probiotic Product (PROBIOTIC PO) Take by mouth.  . triamcinolone (NASACORT) 55 MCG/ACT AERO nasal inhaler Place 2 sprays into the nose daily.    Allergies: Patient is allergic to penicillins; sulfa antibiotics; sulfasalazine; tramadol; ciprofloxacin; and trimethoprim. Family History: Patient family history includes Arthritis in her father and mother; Heart disease in her mother; Mental illness in her father; Osteoporosis in her mother; Other in her mother; Stroke in her paternal grandfather; Sudden death in her father. Social History:  Patient  reports that she has never smoked. She has never used smokeless tobacco. She reports that she drinks alcohol. She reports that she does not use drugs.  Review of Systems: Constitutional: Negative for fever malaise or anorexia Cardiovascular: negative for chest pain Respiratory: negative for SOB or persistent cough Gastrointestinal: negative for abdominal pain  Objective  Vitals: BP 130/84   Pulse 67   Temp 98.4 F (36.9 C)   Ht 5\' 3"  (1.6 m)   Wt 196 lb (88.9 kg)   BMI 34.72 kg/m  General: no acute distress , A&Ox3, moving easily HEENT: PEERL, conjunctiva normal, Oropharynx moist,neck is supple Cardiovascular:  RRR without murmur or gallop.  Respiratory:  Good breath sounds bilaterally, CTAB with normal respiratory effort,  no CVAT Skin:  Warm, no rashes Left mid lateral back (lower rib cage) ttp and reproduces pain. No rash. FROM with pain with right lateral extension only.   Office Visit on 12/13/2017  Component Date Value Ref Range Status  . Color, UA 12/13/2017 yellow   Final  . Clarity, UA 12/13/2017 clear   Final  . Glucose, UA 12/13/2017 trace   Final  . Bilirubin, UA 12/13/2017 neg   Final  . Ketones, UA 12/13/2017 neg   Final  . Spec Grav, UA  12/13/2017 1.025  1.010 - 1.025 Final  . Blood, UA 12/13/2017 neg   Final  . pH, UA 12/13/2017 6.0  5.0 - 8.0 Final  . Protein, UA 12/13/2017 neg   Final  . Urobilinogen, UA 12/13/2017 0.2  0.2 or 1.0 E.U./dL Final  . Nitrite, UA 12/13/2017 neg   Final  . Leukocytes, UA 12/13/2017 Moderate (2+)* Negative Final  . Odor 12/13/2017 odor   Final    Assessment  1. Acute left-sided low back pain without sciatica      Plan   msk pain :  Start tylenol or advil and monitor.   Follow up: prn    Commons side effects, risks, benefits, and alternatives for medications and treatment plan prescribed today were discussed, and the patient expressed understanding of the given instructions. Patient is instructed to call or message via MyChart if he/she has any questions or concerns regarding our treatment plan. No barriers to understanding were identified. We discussed Red Flag symptoms and signs in detail. Patient expressed understanding regarding what to do in case of urgent or emergency type symptoms.   Medication list was reconciled, printed and provided to the patient in AVS. Patient instructions and summary information was reviewed with the patient as documented in the AVS. This note was prepared with assistance of Dragon voice recognition software. Occasional wrong-word or sound-a-like substitutions may have occurred due to the inherent limitations of voice recognition software  Orders Placed This Encounter  Procedures  . POCT urinalysis dipstick   Meds ordered this encounter  Medications  . polyethylene glycol powder (GLYCOLAX/MIRALAX) powder    Sig: Take 17 g by mouth daily as needed.    Dispense:  3350 g    Refill:  1

## 2017-12-18 ENCOUNTER — Telehealth: Payer: Self-pay | Admitting: Family Medicine

## 2017-12-18 NOTE — Telephone Encounter (Signed)
Copied from [place] [id number]. Topic: Quick Communication - See Telephone Encounter >> Dec 18, 2017  4:52 PM Bea Graff, NT wrote: CRM for notification. See Telephone encounter for: 12/18/17. Pt states she cannot get in with the pain clinic until April 15th and wants to see if Dr. Jonni Sanger would order her something for pain? Tylenol and ibprophen is not helping. Uses [facility] on [address].

## 2017-12-19 NOTE — Telephone Encounter (Signed)
Is she referring to her back pain that she was here for last week (acute back pain?) I did not refer her to the pain clinic?  Order ultram 50mg  po q 6 hours prn #30 to use for pain if needed.   Would need follow up if pain persists.

## 2017-12-19 NOTE — Telephone Encounter (Signed)
Patient is allergic to tramadol. Please advise.

## 2017-12-19 NOTE — Telephone Encounter (Signed)
Called patient and she stated that she has been taking Aleve and she stated that this is no longer working. Patient stated that she has been seeing Dr. Lynden Oxford at a pain clinic in [facility] and has been getting steroid injections every so many months to help with the bone spur in her lower back that rubs on a nerve. She stated that she has a hard time trying to get a quick appointment to see Dr. Arrie Eastern.  Patient asked if you could send her in something until she can see Dr. Arrie Eastern on April 15th? (Allergic to Tramadol)

## 2017-12-19 NOTE — Telephone Encounter (Signed)
See phone note, patient states she cannot get into the pain clinic until April 15th. Wants to know if Dr. Jonni Sanger can prescribe something to help with pain. Please advise.    Erin Hall,  LPN

## 2017-12-19 NOTE — Telephone Encounter (Signed)
Ok, then please call her and get more information. I am not sure what she is talking about and haven't sent her to pain clinic and she hasn't asked me for pain medication. thanks

## 2017-12-20 MED ORDER — DICLOFENAC SODIUM 75 MG PO TBEC: 75.0000 mg | DELAYED_RELEASE_TABLET | Freq: Two times a day (BID) | ORAL | 0 refills | Status: DC

## 2017-12-20 NOTE — Telephone Encounter (Signed)
Called patient and left a detailed voice message to let her know that Dr. Jonni Sanger has sent in a prescription for Diclofenac and if this is not helping to please reach out to the pain clinic and have them advise or see if she can be worked in sooner or she can contact our office and make an appointment for reevaluation per Dr. Jonni Sanger.

## 2017-12-20 NOTE — Telephone Encounter (Signed)
I have ordered a different antiinflammatory pain medication to use called diclofenac. She has been on this before.   If this is not helping, I recommend calling the pain clinic to see what they advise OR let's reevaluate in office. Thanks.

## 2017-12-30 ENCOUNTER — Ambulatory Visit: Payer: Self-pay | Admitting: *Deleted

## 2017-12-30 NOTE — Telephone Encounter (Signed)
Patient  In no  Distress   Speaking  In  Complete  sentences -  Pt  Denies  Any  Chest  Pain or  Shortness  Of  Breath.  Pt  Reports  Recent changes  In  bp  Meds. Pt  Offered  An office  Visit  Today-  Declines   -  Office visit made for tomorrow . Advised if any chest pan or  Shortness of breath to  Go to er. Reason for Disposition . [1] Palpitations AND [2] no improvement after following Care Advice  Answer Assessment - Initial Assessment Questions 1. DESCRIPTION: "Please describe your heart rate or heart beat that you are having" (e.g., fast/slow, regular/irregular, skipped or extra beats, "palpitations")    Irreg  heartbeart    Skipping  A  Beat   2. ONSET: "When did it start?" (Minutes, hours or days)        10  Days  Ago   3. DURATION: "How long does it last" (e.g., seconds, minutes, hours)         Seconds    4. PATTERN "Does it come and go, or has it been constant since it started?"  "Does it get worse with exertion?"   "Are you feeling it now?"     Comes  And  Goes   - episodes  Every  Few  Minutes    5. TAP: "Using your hand, can you tap out what you are feeling on a chair or table in front of you, so that I can hear?" (Note: not all patients can do this)      In a  Car   Cannot  Do  It   6. HEART RATE: "Can you tell me your heart rate?" "How many beats in 15 seconds?"  (Note: not all patients can do this)        Cannot  Do  It   7. RECURRENT SYMPTOM: "Have you ever had this before?" If so, ask: "When was the last time?" and "What happened that time?"        Had  Milder episode  4-5  Months  Ago   8. CAUSE: "What do you think is causing the palpitations?"     Recent  Medication  Changes   During  OV  LAST  MONTH   9. CARDIAC HISTORY: "Do you have any history of heart disease?" (e.g., heart attack, angina, bypass surgery, angioplasty, arrhythmia)         HTN  10. OTHER SYMPTOMS: "Do you have any other symptoms?" (e.g., dizziness, chest pain, sweating, difficulty breathing)        NO 11. PREGNANCY: "Is there any chance you are pregnant?" "When was your last menstrual period?"       N/A  Protocols used: HEART RATE AND HEARTBEAT QUESTIONS-A-AH

## 2017-12-31 ENCOUNTER — Encounter: Payer: Self-pay | Admitting: Physician Assistant

## 2017-12-31 ENCOUNTER — Ambulatory Visit (INDEPENDENT_AMBULATORY_CARE_PROVIDER_SITE_OTHER): Payer: Medicare Other | Admitting: Physician Assistant

## 2017-12-31 ENCOUNTER — Other Ambulatory Visit: Payer: Self-pay

## 2017-12-31 VITALS — BP 140/92 | HR 69 | Temp 98.1°F | Resp 16 | Ht 63.0 in | Wt 196.0 lb

## 2017-12-31 DIAGNOSIS — I459 Conduction disorder, unspecified: Secondary | ICD-10-CM

## 2017-12-31 DIAGNOSIS — I10 Essential (primary) hypertension: Secondary | ICD-10-CM | POA: Diagnosis not present

## 2017-12-31 MED ORDER — TELMISARTAN 20 MG PO TABS: 20.0000 mg | ORAL_TABLET | Freq: Every day | ORAL | 1 refills | Status: DC

## 2017-12-31 NOTE — Progress Notes (Signed)
Patient presents to clinic today c/o occasional episode of heart skipping a beat. States these episodes last a few seconds but happen throughout the day. States this started after increase was made to her Carvedilol form 12.5 mg BID to 25 mg BID. Denies SOB, chest pain/pressure, lightheadedness, dizziness or diaphoresis. Denies other recent change to medication. Denies change to baseline stress levels. Denies panic attack. She notes she does not drink caffeine or alcohol. Otherwise has been feeling just fine.   Past Medical History:  Diagnosis Date  . Anxiety   . Arthritis   . Aspiration pneumonia ([place]) 2013  . Atypical chest pain    Stress test 04/21/10 - post-stress EF=89%. Normal scan.  . Diarrhea   . Diastolic dysfunction    But normal LV function on ECHO 2011 and low risk Myoview in 2011  . DOE (dyspnea on exertion)   . DVT (deep venous thrombosis) ([place])   . Dyslipidemia   . Fatigue   . GERD (gastroesophageal reflux disease)   . High cholesterol   . Hypertension   . Knee pain   . Osteoarthritis   . Osteopenia   . Palpitations    [employer] MCT Monitor 04/16/10-04/22/10   . SOB (shortness of breath)   . Syncope    Pain-mediated syncope    Current Outpatient Medications on File Prior to Visit  Medication Sig Dispense Refill  . acetaminophen (TYLENOL) 325 MG tablet Take 650 mg by mouth every 4 (four) hours as needed for mild pain, moderate pain or fever. pain    . albuterol (PROVENTIL HFA;VENTOLIN HFA) 108 (90 Base) MCG/ACT inhaler Inhale 2 puffs into the lungs every 6 (six) hours as needed for wheezing or shortness of breath.     Marland Kitchen atorvastatin (LIPITOR) 20 MG tablet Take 20 mg by mouth daily.    Marland Kitchen azelastine (ASTELIN) 0.1 % nasal spray Place 2 sprays into both nostrils 2 (two) times daily. Use in each nostril as directed 30 mL 12  . BLACK ELDERBERRY PO Take by mouth.    . carvedilol (COREG) 12.5 MG tablet Take 25 mg by mouth 2 (two) times daily.    . Cholecalciferol (VITAMIN  D) 2000 UNITS CAPS Take 2,000 Units by mouth daily.     . diclofenac (VOLTAREN) 75 MG EC tablet Take 1 tablet (75 mg total) by mouth 2 (two) times daily. 30 tablet 0  . levocetirizine (XYZAL) 5 MG tablet Take 1 tablet (5 mg total) by mouth every evening. 30 tablet 11  . linaclotide (LINZESS) 145 MCG CAPS capsule     . Multiple Vitamin (MULTIVITAMIN) tablet Take 1 tablet by mouth daily.    Marland Kitchen omeprazole (PRILOSEC) 20 MG capsule Take by mouth.    . polyethylene glycol powder (GLYCOLAX/MIRALAX) powder Take 17 g by mouth daily as needed. 3350 g 1  . Probiotic Product (PROBIOTIC PO) Take by mouth.    . triamcinolone (NASACORT) 55 MCG/ACT AERO nasal inhaler Place 2 sprays into the nose daily. 1 Inhaler 12   No current facility-administered medications on file prior to visit.     Allergies  Allergen Reactions  . Penicillins Swelling and Rash    Has patient had a PCN reaction causing immediate rash, facial/tongue/throat swelling, SOB or lightheadedness with hypotension: No Has patient had a PCN reaction causing severe rash involving mucus membranes or skin necrosis: No Has patient had a PCN reaction that required hospitalization unknown Has patient had a PCN reaction occurring within the last 10 years: No If all  of the above answers are "NO", then may proceed with Cephalosporin use.   . Sulfa Antibiotics Swelling and Rash  . Sulfasalazine Rash and Swelling  . Tramadol Other (See Comments)    Rapid heart rate  . Ciprofloxacin     Muscular pain  . Trimethoprim     rash    Family History  Problem Relation Age of Onset  . Heart disease Mother   . Other Mother        leg amputation  . Arthritis Mother   . Osteoporosis Mother   . Mental illness Father   . Sudden death Father   . Arthritis Father   . Stroke Paternal Grandfather     Social History   Socioeconomic History  . Marital status: Single    Spouse name: Not on file  . Number of children: Not on file  . Years of education:  Not on file  . Highest education level: Not on file  Occupational History  . Not on file  Social Needs  . Financial resource strain: Not on file  . Food insecurity:    Worry: Not on file    Inability: Not on file  . Transportation needs:    Medical: Not on file    Non-medical: Not on file  Tobacco Use  . Smoking status: Never Smoker  . Smokeless tobacco: Never Used  Substance and Sexual Activity  . Alcohol use: Yes    Alcohol/week: 0.0 oz    Comment: 2-3/wine per month  . Drug use: No  . Sexual activity: Never  Lifestyle  . Physical activity:    Days per week: Not on file    Minutes per session: Not on file  . Stress: Not on file  Relationships  . Social connections:    Talks on phone: Not on file    Gets together: Not on file    Attends religious service: Not on file    Active member of club or organization: Not on file    Attends meetings of clubs or organizations: Not on file    Relationship status: Not on file  Other Topics Concern  . Not on file  Social History Narrative  . Not on file   Review of Systems - See HPI.  All other ROS are negative.  BP (!) 140/92   Pulse 69   Temp 98.1 F (36.7 C) (Oral)   Resp 16   Ht 5\' 3"  (1.6 m)   Wt 196 lb (88.9 kg)   SpO2 97%   BMI 34.72 kg/m   Physical Exam  Constitutional: She is oriented to person, place, and time. She appears well-developed and well-nourished. No distress.  HENT:  Head: Normocephalic and atraumatic.  Eyes: Conjunctivae are normal.  Neck: Neck supple.  Cardiovascular: Normal rate, normal heart sounds and intact distal pulses.  Occasional extrasystoles (occasional PVC auscultated on exam) are present.  No murmur heard. Pulmonary/Chest: Effort normal and breath sounds normal. No stridor. No respiratory distress. She has no wheezes. She has no rales. She exhibits no tenderness.  Neurological: She is alert and oriented to person, place, and time. No cranial nerve deficit.  Skin: Skin is warm and  dry. No rash noted. She is not diaphoretic.  Psychiatric: She has a normal mood and affect.  Vitals reviewed.  Recent Results (from the past 2160 hour(s))  HM MAMMOGRAPHY     Status: None   Collection Time: 11/20/17 12:00 AM  Result Value Ref Range   HM Mammogram 0-4  Bi-Rad 0-4 Bi-Rad, Self Reported Normal  POCT urinalysis dipstick     Status: Abnormal   Collection Time: 12/13/17  1:49 PM  Result Value Ref Range   Color, UA yellow    Clarity, UA clear    Glucose, UA trace    Bilirubin, UA neg    Ketones, UA neg    Spec Grav, UA 1.025 1.010 - 1.025   Blood, UA neg    pH, UA 6.0 5.0 - 8.0   Protein, UA neg    Urobilinogen, UA 0.2 0.2 or 1.0 E.U./dL   Nitrite, UA neg    Leukocytes, UA Moderate (2+) (A) Negative   Appearance     Odor odor    Assessment/Plan: 1. Skipped beats EKG with sinus rhythm. Low voltage in precordial leads with poor r-wave progression. Unchanged from prior EKG. Otherwise asymptomatic. Will decrease Carvedilol back to prior dose and have patient keep close eye on BP, pulse and symptoms. Close follow-up scheduled for 1 week. If no improvement with medication change, will need Holter study versus cardiology assessment. Strict ER precautions reviewed with patient.  - EKG 12-Lead  2. Essential hypertension Above goal today. Will decrease Carvedilol back to 12.5 mg BID as noted above. Will attempt trial of Micardis 20 mg daily to help with BP. Follow-up 1 week. She previously was taken off of losartan due to recalls and a dry cough. If there is any recurrence of her cough on Micardis she is to let us know.    Leeanne Rio, PA-C

## 2017-12-31 NOTE — Patient Instructions (Addendum)
Please decrease the Carvedilol back to 1 tablet twice daily.  Start the Micardis once daily as directed. Follow-up with [place] in 1 week for reassessment of BP and the skipped beats. If the carvedilol was truly the culprit, this should resolve. If continuing we will need assessment with a Holter monitor.  If you develop any chest pain, shortness of breath, dizziness you need assessment in the ER.

## 2018-01-07 ENCOUNTER — Other Ambulatory Visit: Payer: Self-pay | Admitting: Gastroenterology

## 2018-01-07 DIAGNOSIS — R740 Nonspecific elevation of levels of transaminase and lactic acid dehydrogenase [LDH]: Secondary | ICD-10-CM | POA: Diagnosis not present

## 2018-01-07 DIAGNOSIS — R1032 Left lower quadrant pain: Secondary | ICD-10-CM | POA: Diagnosis not present

## 2018-01-07 DIAGNOSIS — Z86010 Personal history of colonic polyps: Secondary | ICD-10-CM | POA: Diagnosis not present

## 2018-01-07 DIAGNOSIS — K219 Gastro-esophageal reflux disease without esophagitis: Secondary | ICD-10-CM | POA: Diagnosis not present

## 2018-01-08 ENCOUNTER — Ambulatory Visit: Payer: Medicare Other | Admitting: Family Medicine

## 2018-01-08 ENCOUNTER — Ambulatory Visit (INDEPENDENT_AMBULATORY_CARE_PROVIDER_SITE_OTHER): Payer: Medicare Other | Admitting: Family Medicine

## 2018-01-08 ENCOUNTER — Encounter: Payer: Self-pay | Admitting: Family Medicine

## 2018-01-08 ENCOUNTER — Other Ambulatory Visit: Payer: Self-pay

## 2018-01-08 VITALS — BP 114/76 | HR 71 | Temp 98.2°F | Resp 16 | Ht 63.0 in | Wt 194.4 lb

## 2018-01-08 DIAGNOSIS — K580 Irritable bowel syndrome with diarrhea: Secondary | ICD-10-CM

## 2018-01-08 DIAGNOSIS — K219 Gastro-esophageal reflux disease without esophagitis: Secondary | ICD-10-CM

## 2018-01-08 DIAGNOSIS — I10 Essential (primary) hypertension: Secondary | ICD-10-CM | POA: Diagnosis not present

## 2018-01-08 MED ORDER — TRIAMCINOLONE ACETONIDE 0.1 % EX CREA: 1.0000 "application " | TOPICAL_CREAM | Freq: Two times a day (BID) | CUTANEOUS | 0 refills | Status: DC | PRN

## 2018-01-08 NOTE — Progress Notes (Signed)
Subjective  CC:  Chief Complaint  Patient presents with  . Blood Pressure Check    BP Follow up, IBS flare up also    HPI: Erin West is a 67 y.o. female who presents to the office today to address the problems listed above in the chief complaint.  Hypertension f/u: Control is good . Reviewed last note: pt reported palpitations when increasing coreg dose, so coreg was decreased back to 12.5 and micardis was added. pt has chronic cough and now suffering with allergies so can't see a difference on or off ARB. No more palpitations. Pt reports she is doing well. taking medications as instructed, no medication side effects noted, no TIAs, no chest pain on exertion, no dyspnea on exertion, no swelling of ankles.   She denies adverse effects from his BP medications. Compliance with medication is good.   IBS flare - evaluation ongoing by Dr. Collene Mares. Had exam, labs and scheduled for abd CT. Reports mild rectal irritation due to diarrhea. Request steroid cream refill.  BP Readings from Last 3 Encounters:  01/08/18 114/76  12/31/17 (!) 140/92  12/13/17 130/84   Wt Readings from Last 3 Encounters:  01/08/18 194 lb 6.4 oz (88.2 kg)  12/31/17 196 lb (88.9 kg)  12/13/17 196 lb (88.9 kg)  I reviewed the patients updated PMH, FH, and SocHx.    Patient Active Problem List   Diagnosis Date Noted  . Mixed hyperlipidemia 12/04/2017  . Chronic seasonal allergic rhinitis 12/04/2017  . Obesity, Class I, BMI 30-34.9 10/01/2017  . Chronic pansinusitis 09/09/2017  . Laryngopharyngeal reflux (LPR) 09/09/2017  . Persistent cough 07/24/2017  . Status post total right knee replacement 01/24/2016  . Bilateral leg edema 03/30/2014  . History of gastritis 02/10/2014  . Dysphagia 01/28/2014  . Irritable bowel syndrome (IBS) 09/08/2013  . Osteopenia 07/14/2013  . Osteoarthritis, multiple sites 07/14/2013  . Essential hypertension   . Gastroesophageal reflux disease without esophagitis     Allergies:  Penicillins; Sulfa antibiotics; Sulfasalazine; Tramadol; Ciprofloxacin; and Trimethoprim  Social History: Patient  reports that she has never smoked. She has never used smokeless tobacco. She reports that she drinks alcohol. She reports that she does not use drugs.  Current Meds  Medication Sig  . acetaminophen (TYLENOL) 325 MG tablet Take 650 mg by mouth every 4 (four) hours as needed for mild pain, moderate pain or fever. pain  . atorvastatin (LIPITOR) 20 MG tablet Take 20 mg by mouth daily.  Marland Kitchen azelastine (ASTELIN) 0.1 % nasal spray Place 2 sprays into both nostrils 2 (two) times daily. Use in each nostril as directed  . BLACK ELDERBERRY PO Take by mouth.  . carvedilol (COREG) 12.5 MG tablet Take 25 mg by mouth 2 (two) times daily.  . Cholecalciferol (VITAMIN D) 2000 UNITS CAPS Take 2,000 Units by mouth daily.   . diclofenac (VOLTAREN) 75 MG EC tablet Take 1 tablet (75 mg total) by mouth 2 (two) times daily.  Marland Kitchen levocetirizine (XYZAL) 5 MG tablet Take 1 tablet (5 mg total) by mouth every evening.  . linaclotide (LINZESS) 145 MCG CAPS capsule   . Multiple Vitamin (MULTIVITAMIN) tablet Take 1 tablet by mouth daily.  Marland Kitchen omeprazole (PRILOSEC) 20 MG capsule Take by mouth.  . polyethylene glycol powder (GLYCOLAX/MIRALAX) powder Take 17 g by mouth daily as needed.  . Probiotic Product (PROBIOTIC PO) Take by mouth.  . telmisartan (MICARDIS) 20 MG tablet Take 1 tablet (20 mg total) by mouth daily.  Marland Kitchen triamcinolone (NASACORT) 55  MCG/ACT AERO nasal inhaler Place 2 sprays into the nose daily.    Review of Systems: Cardiovascular: negative for chest pain, palpitations, leg swelling, orthopnea Respiratory: negative for SOB, wheezing or persistent cough Gastrointestinal: negative for abdominal pain Genitourinary: negative for dysuria or gross hematuria  Objective  Vitals: BP 114/76   Pulse 71   Temp 98.2 F (36.8 C) (Oral)   Resp 16   Ht 5\' 3"  (1.6 m)   Wt 194 lb 6.4 oz (88.2 kg)   SpO2 95%    BMI 34.44 kg/m  General: no acute distress  Psych:  Alert and oriented, normal mood and affect HEENT:  Normocephalic, atraumatic, supple neck  Cardiovascular:  RRR without murmur. no edema Respiratory:  Good breath sounds bilaterally, CTAB with normal respiratory effort Skin:  Warm, no rashes Neurologic:   Mental status is normal  Assessment  1. Essential hypertension   2. Irritable bowel syndrome with diarrhea   3. Laryngopharyngeal reflux (LPR)      Plan    Hypertension f/u: BP control is well controlled. Continue current meds. Monitor for cough changes on ARB  IBS - per GI. Refilled steroid cream for limited use.   LPR - monitor cough Education regarding management of these chronic disease states was given. Management strategies discussed on successive visits include dietary and exercise recommendations, goals of achieving and maintaining IBW, and lifestyle modifications aiming for adequate sleep and minimizing stressors.   Follow up: 6 months for bp recheck   Commons side effects, risks, benefits, and alternatives for medications and treatment plan prescribed today were discussed, and the patient expressed understanding of the given instructions. Patient is instructed to call or message via MyChart if he/she has any questions or concerns regarding our treatment plan. No barriers to understanding were identified. We discussed Red Flag symptoms and signs in detail. Patient expressed understanding regarding what to do in case of urgent or emergency type symptoms.   Medication list was reconciled, printed and provided to the patient in AVS. Patient instructions and summary information was reviewed with the patient as documented in the AVS. This note was prepared with assistance of Dragon voice recognition software. Occasional wrong-word or sound-a-like substitutions may have occurred due to the inherent limitations of voice recognition software  No orders of the defined types were  placed in this encounter.  Meds ordered this encounter  Medications  . triamcinolone cream (KENALOG) 0.1 %    Sig: Apply 1 application topically 2 (two) times daily as needed.    Dispense:  30 g    Refill:  0

## 2018-01-08 NOTE — Patient Instructions (Addendum)
Please follow up if symptoms do not improve or as needed.   Continue your current medications as is.

## 2018-01-10 ENCOUNTER — Ambulatory Visit
Admission: RE | Admit: 2018-01-10 | Discharge: 2018-01-10 | Disposition: A | Discharge status: Home / Self Care | Payer: Medicare Other | Source: Ambulatory Visit | Attending: Gastroenterology | Admitting: Gastroenterology

## 2018-01-10 DIAGNOSIS — R1032 Left lower quadrant pain: Secondary | ICD-10-CM

## 2018-01-10 DIAGNOSIS — K5900 Constipation, unspecified: Secondary | ICD-10-CM | POA: Diagnosis not present

## 2018-01-10 MED ORDER — IOPAMIDOL (ISOVUE-300) INJECTION 61%
100.0000 mL | Freq: Once | INTRAVENOUS | Status: AC | PRN
  Administered 2018-01-10: 100 mL via INTRAVENOUS

## 2018-01-10 NOTE — Progress Notes (Signed)
Patient ID: Erin West, female   DOB: Jun 04, 1951, 67 y.o.   MRN: [id number] Called to see patient after CT abd with contrast Omnipaque.  Pt c/o mild throat itching, oral hives.  No skin hives.  Pt hypertensive and alert.  Given 50 mg po benadryl for mild contrast allergy.  Monitored for 30 minutes with improvement.  bp 160/101.    Impression:  Mild IV contrast allergic reaction

## 2018-01-15 ENCOUNTER — Ambulatory Visit: Payer: Medicare Other | Admitting: Family Medicine

## 2018-01-27 ENCOUNTER — Telehealth: Payer: Self-pay | Admitting: Family Medicine

## 2018-01-27 NOTE — Telephone Encounter (Unsigned)
Copied from [place] [id number]. Topic: Quick Communication - See Telephone Encounter >> Jan 27, 2018  1:17 PM Hewitt Shorts wrote: Pt states that the new blood pressure medication she was just changed to telmisartan is on back order and her pharmacy is not sure when it will be available and she is needing to see about getting it changed again  Best number [phone]

## 2018-01-28 NOTE — Telephone Encounter (Signed)
Please see message and advise 

## 2018-01-28 NOTE — Telephone Encounter (Signed)
I have not received notice of major back log with micardis yet. Can you or she call other pharmacy to see if still available elsewhere? Since recent bp was well controlled and she had problems with increased dose of coreg, would love to keep her on it for now. ? Mail order availability?

## 2018-01-28 NOTE — Telephone Encounter (Signed)
Called patient and left a detailed voice message to have her please call us back and give [place] a different pharmacy to use so we can continue her current BP regimen to give her the best control.  CRM Created.

## 2018-02-04 ENCOUNTER — Other Ambulatory Visit: Payer: Self-pay

## 2018-02-04 MED ORDER — TELMISARTAN 20 MG PO TABS: 20.0000 mg | ORAL_TABLET | Freq: Every day | ORAL | 2 refills | Status: DC

## 2018-02-04 NOTE — Telephone Encounter (Signed)
Medication has been sent to [facility] for the patient.

## 2018-02-04 NOTE — Telephone Encounter (Signed)
Pt called - she is asking for med telmisartan (MICARDIS) 20 MG tablet  [facility]. 90 day supply She is requesting 2 refills .  cb is [phone]

## 2018-02-18 DIAGNOSIS — I781 Nevus, non-neoplastic: Secondary | ICD-10-CM | POA: Diagnosis not present

## 2018-02-18 DIAGNOSIS — L821 Other seborrheic keratosis: Secondary | ICD-10-CM | POA: Diagnosis not present

## 2018-02-18 DIAGNOSIS — D1801 Hemangioma of skin and subcutaneous tissue: Secondary | ICD-10-CM | POA: Diagnosis not present

## 2018-02-18 DIAGNOSIS — L918 Other hypertrophic disorders of the skin: Secondary | ICD-10-CM | POA: Diagnosis not present

## 2018-03-24 DIAGNOSIS — S90122A Contusion of left lesser toe(s) without damage to nail, initial encounter: Secondary | ICD-10-CM | POA: Diagnosis not present

## 2018-04-22 ENCOUNTER — Ambulatory Visit: Payer: Self-pay | Admitting: *Deleted

## 2018-04-22 ENCOUNTER — Telehealth: Payer: Self-pay | Admitting: Family Medicine

## 2018-04-22 DIAGNOSIS — I499 Cardiac arrhythmia, unspecified: Secondary | ICD-10-CM

## 2018-04-22 NOTE — Telephone Encounter (Signed)
Referral Placed, Patient has been informed.   Doloris Hall,  LPN

## 2018-04-22 NOTE — Telephone Encounter (Signed)
Please schedule office visit with me for evaluation and assess meds/EKG and further testing for cards. Thanks.

## 2018-04-22 NOTE — Telephone Encounter (Addendum)
Pt called with complaints of palpitations that have been occurring more frequently over the past month; she states that she has had them before, but now they are becoming more frequent (20 times a day) but are not stronger; she states that she had been taking carvedilol 12.5 mg (2 tablets) twice daily; she would like to see Dr Debara Pickett at [facility]; she states that she needs a referral for this and she would like  Dr Jonni Sanger to send this; she also states that she has no associated shortness of breath or weakness; she says thata few days ago her BP was 130/83 and HR low 90s; the pt can be contacted at [phone]; will route to office for provider review.  Reason for Disposition . [1] Caller requesting NON-URGENT health information AND [2] PCP's office is the best resource  Answer Assessment - Initial Assessment Questions 1. REASON FOR CALL or QUESTION: "What is your reason for calling today?" or "How can I best help you?" or "What question do you have that I can help answer?"     Patient would like referral to Dr Debara Pickett, [facility]; pt is having more frequent palpitations and was told since Dr Debara Pickett has not seen her in over 3 years, she will need a referral from Dr Jonni Sanger  Protocols used: INFORMATION ONLY CALL-A-AH

## 2018-04-22 NOTE — Telephone Encounter (Signed)
Copied from [place] [id number]. Topic: Referral - Request >> Apr 22, 2018  8:43 AM Burchel, Abbi R wrote: Reason for CRM:   To: Dr Debara Pickett @ [facility]  Pt has seen him in the past, and is having heart palpitations every few minutes.  Pt thinks she may need to change meds.

## 2018-04-22 NOTE — Telephone Encounter (Signed)
Per CRM that was received:   Erin West 04/22/2018 11:06 AM  Patient states she has seen Dr Jonni Sanger for this before and thinks it is a waste of time to come in for an appt. She really wanted to see her heart Dr Debara Pickett but they said she needed a new referral since it has been so long since she has been in. Please call patient.

## 2018-04-22 NOTE — Telephone Encounter (Signed)
Ok. Please make referral. I typically will order holter monitor and ekg prior to sending to cards, but will have to let Dr. Debara Pickett order any testing he wants. And notify pt.

## 2018-04-22 NOTE — Telephone Encounter (Signed)
Pt was last seen 01/08/18 for hypertension.  Ok to place referral to cardiology or would you prefer to see pt first?

## 2018-04-22 NOTE — Addendum Note (Signed)
Addended bySigurd Sos on: 04/22/2018 02:21 PM   Modules accepted: Orders

## 2018-04-22 NOTE — Telephone Encounter (Signed)
Called pt and left a detailed message to advise of PCP recommendations to have appt with our office first.   Ok to schedule.  [place] for [facility] to Discuss results / PCP recommendations / Schedule patient.

## 2018-04-22 NOTE — Telephone Encounter (Signed)
FYI. Please advise.

## 2018-05-09 DIAGNOSIS — M25561 Pain in right knee: Secondary | ICD-10-CM | POA: Diagnosis not present

## 2018-05-09 DIAGNOSIS — M7051 Other bursitis of knee, right knee: Secondary | ICD-10-CM | POA: Diagnosis not present

## 2018-05-09 DIAGNOSIS — Z96651 Presence of right artificial knee joint: Secondary | ICD-10-CM | POA: Diagnosis not present

## 2018-05-09 DIAGNOSIS — I10 Essential (primary) hypertension: Secondary | ICD-10-CM | POA: Diagnosis not present

## 2018-05-16 DIAGNOSIS — S92515A Nondisplaced fracture of proximal phalanx of left lesser toe(s), initial encounter for closed fracture: Secondary | ICD-10-CM | POA: Diagnosis not present

## 2018-05-16 DIAGNOSIS — M79675 Pain in left toe(s): Secondary | ICD-10-CM | POA: Diagnosis not present

## 2018-05-16 DIAGNOSIS — S92532A Displaced fracture of distal phalanx of left lesser toe(s), initial encounter for closed fracture: Secondary | ICD-10-CM | POA: Diagnosis not present

## 2018-05-30 DIAGNOSIS — M2041 Other hammer toe(s) (acquired), right foot: Secondary | ICD-10-CM | POA: Diagnosis not present

## 2018-05-30 DIAGNOSIS — M79675 Pain in left toe(s): Secondary | ICD-10-CM | POA: Diagnosis not present

## 2018-05-30 DIAGNOSIS — S92525D Nondisplaced fracture of medial phalanx of left lesser toe(s), subsequent encounter for fracture with routine healing: Secondary | ICD-10-CM | POA: Diagnosis not present

## 2018-05-30 DIAGNOSIS — M65872 Other synovitis and tenosynovitis, left ankle and foot: Secondary | ICD-10-CM | POA: Diagnosis not present

## 2018-06-02 NOTE — Progress Notes (Signed)
Referring-Erin Jonni Sanger, MD Reason for referral-palpitations  HPI: 67 year old female for evaluation of palpitations at request of Billey Chang, MD.  Nuclear study July 2011 showed normal LV function and normal perfusion.  Event monitor July 2011 showed normal sinus rhythm.  Echocardiogram August 2015 showed normal LV function and mild diastolic dysfunction.  Patient denies dyspnea on exertion, orthopnea, PND, pedal edema, chest pain or syncope.  She does have a history of palpitations and they are described as a skip followed by pause.  They are not sustained.  There is occasional dyspnea associated but no syncope.  Cardiology now asked to evaluate.  Current Outpatient Medications  Medication Sig Dispense Refill  . acetaminophen (TYLENOL) 325 MG tablet Take 650 mg by mouth every 4 (four) hours as needed for mild pain, moderate pain or fever. pain    . albuterol (PROVENTIL HFA;VENTOLIN HFA) 108 (90 Base) MCG/ACT inhaler Inhale 2 puffs into the lungs every 6 (six) hours as needed for wheezing or shortness of breath.     Marland Kitchen atorvastatin (LIPITOR) 20 MG tablet Take 20 mg by mouth daily.    Marland Kitchen azelastine (ASTELIN) 0.1 % nasal spray Place 2 sprays into both nostrils 2 (two) times daily. Use in each nostril as directed 30 mL 12  . carvedilol (COREG) 12.5 MG tablet Take 12.5 mg by mouth 2 (two) times daily.     . Cholecalciferol (VITAMIN D) 2000 UNITS CAPS Take 2,000 Units by mouth daily.     Marland Kitchen levocetirizine (XYZAL) 5 MG tablet Take 1 tablet (5 mg total) by mouth every evening. 30 tablet 11  . Multiple Vitamin (MULTIVITAMIN) tablet Take 1 tablet by mouth daily.    Marland Kitchen omeprazole (PRILOSEC) 20 MG capsule Take by mouth.    . polyethylene glycol powder (GLYCOLAX/MIRALAX) powder Take 17 g by mouth daily as needed. 3350 g 1  . Probiotic Product (PROBIOTIC PO) Take by mouth.    . telmisartan (MICARDIS) 20 MG tablet Take 1 tablet (20 mg total) by mouth daily. 90 tablet 2   No current facility-administered  medications for this visit.     Allergies  Allergen Reactions  . Penicillins Swelling and Rash    Has patient had a PCN reaction causing immediate rash, facial/tongue/throat swelling, SOB or lightheadedness with hypotension: No Has patient had a PCN reaction causing severe rash involving mucus membranes or skin necrosis: No Has patient had a PCN reaction that required hospitalization unknown Has patient had a PCN reaction occurring within the last 10 years: No If all of the above answers are "NO", then may proceed with Cephalosporin use.   . Sulfa Antibiotics Swelling and Rash  . Sulfasalazine Rash and Swelling  . Tramadol Other (See Comments)    Rapid heart rate  . Ciprofloxacin     Muscular pain  . Trimethoprim     rash  . Isovue [Iopamidol] Hives    Pt had sneezing, itchy throat, a couple of hives and swollen, itchy left eye. Pt was given 50 mg po benadryl, and water.  Dr Carlis Abbott checked pt.  We observed pt for 30 mins w/ 5 minute BP checks.  Pt left w/o complication.  Pt will need full premeds in the future.  Alfonse Alpers, RTRCT     Past Medical History:  Diagnosis Date  . Anxiety   . Arthritis   . Aspiration pneumonia ([place]) 2013  . Atypical chest pain    Stress test 04/21/10 - post-stress EF=89%. Normal scan.  . Diarrhea   .  Diastolic dysfunction    But normal LV function on ECHO 2011 and low risk Myoview in 2011  . DVT (deep venous thrombosis) ([place])   . Dyslipidemia   . GERD (gastroesophageal reflux disease)   . Hypertension   . Osteoarthritis   . Osteopenia   . Palpitations    [employer] MCT Monitor 04/16/10-04/22/10   . Syncope    Pain-mediated syncope    Past Surgical History:  Procedure Laterality Date  . CHOLECYSTECTOMY  2006  . KNEE ARTHROSCOPY Right   . REPLACEMENT TOTAL KNEE Left    left  . TUBAL LIGATION  06/1978    Social History   Socioeconomic History  . Marital status: Married    Spouse name: Not on file  . Number of children: 3  . Years of  education: Not on file  . Highest education level: Not on file  Occupational History  . Not on file  Social Needs  . Financial resource strain: Not on file  . Food insecurity:    Worry: Not on file    Inability: Not on file  . Transportation needs:    Medical: Not on file    Non-medical: Not on file  Tobacco Use  . Smoking status: Never Smoker  . Smokeless tobacco: Never Used  Substance and Sexual Activity  . Alcohol use: Yes    Alcohol/week: 0.0 standard drinks    Comment: 2-3/wine per month  . Drug use: No  . Sexual activity: Never  Lifestyle  . Physical activity:    Days per week: Not on file    Minutes per session: Not on file  . Stress: Not on file  Relationships  . Social connections:    Talks on phone: Not on file    Gets together: Not on file    Attends religious service: Not on file    Active member of club or organization: Not on file    Attends meetings of clubs or organizations: Not on file    Relationship status: Not on file  . Intimate partner violence:    Fear of current or ex partner: Not on file    Emotionally abused: Not on file    Physically abused: Not on file    Forced sexual activity: Not on file  Other Topics Concern  . Not on file  Social History Narrative  . Not on file    Family History  Problem Relation Age of Onset  . Heart disease Mother   . Other Mother        leg amputation  . Arthritis Mother   . Osteoporosis Mother   . Mental illness Father   . Sudden death Father   . Arthritis Father   . Stroke Paternal Grandfather     ROS: no fevers or chills, productive cough, hemoptysis, dysphasia, odynophagia, melena, hematochezia, dysuria, hematuria, rash, seizure activity, orthopnea, PND, pedal edema, claudication. Remaining systems are negative.  Physical Exam:   Blood pressure 132/90, pulse 74, height 5\' 3"  (1.6 m), weight 194 lb (88 kg).  General:  Well developed/well nourished in NAD Skin warm/dry Patient not depressed No  peripheral clubbing Back-normal HEENT-normal/normal eyelids Neck supple/normal carotid upstroke bilaterally; no bruits; no JVD; no thyromegaly chest - CTA/ normal expansion CV - RRR/normal S1 and S2; no murmurs, rubs or gallops;  PMI nondisplaced Abdomen -NT/ND, no HSM, no mass, + bowel sounds, no bruit 2+ femoral pulses, no bruits Ext-no edema, chords, 2+ DP Neuro-grossly nonfocal  ECG -normal sinus rhythm at a  rate of 74.  No ST changes.  Personally reviewed  A/P  1 palpitations-symptoms sound likely to be PACs or PVCs.  I will increase carvedilol to 25 mg twice daily for symptomatic relief.  Repeat echocardiogram.  Check TSH.  2 hypertension-blood pressure mildly elevated today.  I have asked her to follow this at home particularly with increased dose of carvedilol.  If blood pressure begins to become too low we can decrease or discontinue ARB.  3 hyperlipidemia-continue statin.  Lipids and liver followed by primary care.  [name], MD

## 2018-06-03 ENCOUNTER — Encounter: Payer: Self-pay | Admitting: Cardiology

## 2018-06-03 ENCOUNTER — Ambulatory Visit (INDEPENDENT_AMBULATORY_CARE_PROVIDER_SITE_OTHER): Payer: Medicare Other | Admitting: Cardiology

## 2018-06-03 VITALS — BP 132/90 | HR 74 | Ht 63.0 in | Wt 194.0 lb

## 2018-06-03 DIAGNOSIS — R002 Palpitations: Secondary | ICD-10-CM | POA: Diagnosis not present

## 2018-06-03 DIAGNOSIS — E7800 Pure hypercholesterolemia, unspecified: Secondary | ICD-10-CM | POA: Diagnosis not present

## 2018-06-03 DIAGNOSIS — R0600 Dyspnea, unspecified: Secondary | ICD-10-CM

## 2018-06-03 DIAGNOSIS — I10 Essential (primary) hypertension: Secondary | ICD-10-CM

## 2018-06-03 MED ORDER — CARVEDILOL 25 MG PO TABS: 25.0000 mg | ORAL_TABLET | Freq: Two times a day (BID) | ORAL | 3 refills | Status: DC

## 2018-06-03 NOTE — Patient Instructions (Signed)
Medication Instructions: Your Physician recommend you make the following changes to your medication. Increase Carvedilol 25 mg twice a day    If you need a refill on your cardiac medications before your next appointment, please call your pharmacy.   Labwork: Your physician recommends that you return for lab work Today ( TSH)   Procedures/Testing: Your physician has requested that you have an echocardiogram. Echocardiography is a painless test that uses sound waves to create images of your heart. It provides your doctor with information about the size and shape of your heart and how well your heart's chambers and valves are working. This procedure takes approximately one hour. There are no restrictions for this procedure. [place] [address]   Follow-Up: Your physician wants you to follow-up in 6 months with Dr Stanford Breed. You will receive a reminder letter in the mail two months in advance. If you don't receive a letter, please call our office at [phone] to schedule this follow-up appointment.   Special Instructions:    Thank you for choosing [facility] at [facility]!!

## 2018-06-04 ENCOUNTER — Telehealth: Payer: Self-pay | Admitting: Cardiovascular Disease

## 2018-06-04 LAB — TSH: TSH: 3.43 u[IU]/mL (ref 0.450–4.500)

## 2018-06-04 NOTE — Telephone Encounter (Signed)
Left message to call back  

## 2018-06-04 NOTE — Telephone Encounter (Signed)
Spoke with pt. Pt was seen by Dr.Crenshaw yesterday and her Carvedilol was increased to 25mg  bid.  She started the increased dose of Carvedilol 25mg  this morning along with Telmisartan 20mg  daily around 8:30am. She reports feeling sluggish and decided to check her Bp. @10am  it was 84/53 heart rate in the low 70's.  I asked the pt to recheck her BP while I was on the phone. Her reading was 108/67 77bpm. Pt sts that she is currently asymptomatic and that Dr.Crenshaw discussed with her possibly d/c Telmisartan if her BP began to run low.  Adv pt that I will fwd an update to [facility] and we will give her a call back with his recommendation. Pt agreeable with plan and verbalized understanding.

## 2018-06-04 NOTE — Telephone Encounter (Signed)
New Message       Patient states that her bp was very low this AM (84/53) she is calling to ask if she should continue her meds or stop them?

## 2018-06-04 NOTE — Telephone Encounter (Signed)
Continue increased dose of carvedilol 25 mg twice daily.  Discontinue micardis.  Follow blood pressure.

## 2018-06-05 MED ORDER — CARVEDILOL 25 MG PO TABS: 25.0000 mg | ORAL_TABLET | Freq: Two times a day (BID) | ORAL | 3 refills | Status: DC

## 2018-06-05 NOTE — Telephone Encounter (Signed)
Spoke with pt and advised of Dr. Jacalyn Lefevre recommendation. Pt verbalized understanding.

## 2018-06-11 ENCOUNTER — Ambulatory Visit (HOSPITAL_COMMUNITY): Payer: Medicare Other | Attending: Internal Medicine

## 2018-06-11 ENCOUNTER — Other Ambulatory Visit: Payer: Self-pay

## 2018-06-11 DIAGNOSIS — I10 Essential (primary) hypertension: Secondary | ICD-10-CM | POA: Insufficient documentation

## 2018-06-11 DIAGNOSIS — E785 Hyperlipidemia, unspecified: Secondary | ICD-10-CM | POA: Diagnosis not present

## 2018-06-11 DIAGNOSIS — R0600 Dyspnea, unspecified: Secondary | ICD-10-CM | POA: Diagnosis not present

## 2018-06-11 DIAGNOSIS — E669 Obesity, unspecified: Secondary | ICD-10-CM | POA: Diagnosis not present

## 2018-06-11 DIAGNOSIS — Z6834 Body mass index (BMI) 34.0-34.9, adult: Secondary | ICD-10-CM | POA: Insufficient documentation

## 2018-06-12 ENCOUNTER — Telehealth: Payer: Self-pay | Admitting: *Deleted

## 2018-06-12 ENCOUNTER — Ambulatory Visit (INDEPENDENT_AMBULATORY_CARE_PROVIDER_SITE_OTHER): Payer: Medicare Other | Admitting: Family Medicine

## 2018-06-12 ENCOUNTER — Encounter: Payer: Self-pay | Admitting: Family Medicine

## 2018-06-12 VITALS — BP 128/80 | HR 72 | Temp 98.1°F | Ht 63.0 in | Wt 195.8 lb

## 2018-06-12 DIAGNOSIS — R102 Pelvic and perineal pain: Secondary | ICD-10-CM

## 2018-06-12 DIAGNOSIS — Z23 Encounter for immunization: Secondary | ICD-10-CM | POA: Diagnosis not present

## 2018-06-12 DIAGNOSIS — N952 Postmenopausal atrophic vaginitis: Secondary | ICD-10-CM

## 2018-06-12 DIAGNOSIS — R002 Palpitations: Secondary | ICD-10-CM

## 2018-06-12 LAB — POCT URINALYSIS DIPSTICK
Bilirubin, UA: NEGATIVE
Blood, UA: NEGATIVE
Glucose, UA: NEGATIVE
Ketones, UA: NEGATIVE
LEUKOCYTES UA: NEGATIVE
NITRITE UA: NEGATIVE
PH UA: 5 (ref 5.0–8.0)
Protein, UA: NEGATIVE
Spec Grav, UA: 1.025 (ref 1.010–1.025)
UROBILINOGEN UA: 0.2 U/dL

## 2018-06-12 MED ORDER — ESTRADIOL 0.1 MG/GM VA CREA: 1.0000 | TOPICAL_CREAM | Freq: Every day | VAGINAL | 0 refills | Status: AC

## 2018-06-12 NOTE — Patient Instructions (Signed)
Please see the Gyn as discussed and as scheduled.   Trial of advil and estrogen cream.

## 2018-06-12 NOTE — Telephone Encounter (Signed)
Spoke to patient. Echo  Result given . Verbalized understanding   Patient states she wanted Dr Stanford Breed to be aware she is still having lots of palpitations -even though he recently stopped telmisartan and increased carvedilol to 25 mg twice a day (06/04/18)  patient aware will defer to Dr Stanford Breed

## 2018-06-12 NOTE — Progress Notes (Signed)
Subjective  CC:  Chief Complaint  Patient presents with  . Vaginal Pain    Patient states she has vaginal pain off and on for the last few months, requests flu shot.     HPI: Erin West is a 67 y.o. female who presents to the office today to address the problems listed above in the chief complaint.  67 year old postmenopausal not currently sexually active female presents for 2 to 3 months of intermittent vaginal pain described as a pinching sensation.  The pain typically lasts for a minute or 2.  However it comes and goes and yesterday was recurrent throughout the day.  She has not noted any pattern to her symptoms.  Not associated with movements, bowel movements, urinary function.  She denies abdominal pain nausea vomiting, constipation, fevers, chills, rashes, vaginal itching, vaginal odor or discharge.  She denies symptoms of prolapse.  She admits to vaginal dryness.  She does have an appointment with a gynecologist in the next 2 weeks for further evaluation if needed.  Assessment  1. Vaginal pain   2. Need for influenza vaccination   3. Vaginal atrophy      Plan   Vaginal pain: Unclear etiology.  Normal exam today.  Except for vaginal atrophy.  Treat with vaginal estrogens to see if that helps relieve symptoms.  If it does not, follow-up with GYN for further evaluation.  Patient understands and agrees with current care plan.  Influenza vaccination updated today  Follow up: As needed  Orders Placed This Encounter  Procedures  . Flu vaccine HIGH DOSE PF  . POCT urinalysis dipstick   Meds ordered this encounter  Medications  . estradiol (ESTRACE) 0.1 MG/GM vaginal cream    Sig: Place 1 Applicatorful vaginally at bedtime for 21 days.    Dispense:  42.5 g    Refill:  0      I reviewed the patients updated PMH, FH, and SocHx.    Patient Active Problem List   Diagnosis Date Noted  . Mixed hyperlipidemia 12/04/2017  . Chronic seasonal allergic rhinitis 12/04/2017  .  Obesity, Class I, BMI 30-34.9 10/01/2017  . Chronic pansinusitis 09/09/2017  . Laryngopharyngeal reflux (LPR) 09/09/2017  . Persistent cough 07/24/2017  . Status post total right knee replacement 01/24/2016  . Bilateral leg edema 03/30/2014  . History of gastritis 02/10/2014  . Dysphagia 01/28/2014  . Irritable bowel syndrome (IBS) 09/08/2013  . Osteopenia 07/14/2013  . Osteoarthritis, multiple sites 07/14/2013  . Essential hypertension   . Gastroesophageal reflux disease without esophagitis    Current Meds  Medication Sig  . atorvastatin (LIPITOR) 20 MG tablet Take 20 mg by mouth daily.  Marland Kitchen azelastine (ASTELIN) 0.1 % nasal spray Place 2 sprays into both nostrils 2 (two) times daily. Use in each nostril as directed  . carvedilol (COREG) 25 MG tablet Take 1 tablet (25 mg total) by mouth 2 (two) times daily.  . Cholecalciferol (VITAMIN D) 2000 UNITS CAPS Take 2,000 Units by mouth daily.   . fluticasone (FLONASE) 50 MCG/ACT nasal spray USE 1 SPRAY NASALLY DAILY  . levocetirizine (XYZAL) 5 MG tablet Take 1 tablet (5 mg total) by mouth every evening.  . linaclotide (LINZESS) 145 MCG CAPS capsule   . Multiple Vitamin (MULTIVITAMIN) tablet Take 1 tablet by mouth daily.  Marland Kitchen omeprazole (PRILOSEC) 20 MG capsule Take by mouth.  . polyethylene glycol powder (GLYCOLAX/MIRALAX) powder Take 17 g by mouth daily as needed.  . Probiotic Product (PROBIOTIC PO) Take by mouth.  Allergies: Patient is allergic to penicillins; sulfa antibiotics; sulfasalazine; tramadol; ciprofloxacin; trimethoprim; and isovue [iopamidol]. Family History: Patient family history includes Arthritis in her father and mother; Heart disease in her mother; Mental illness in her father; Osteoporosis in her mother; Other in her mother; Stroke in her paternal grandfather; Sudden death in her father. Social History:  Patient  reports that she has never smoked. She has never used smokeless tobacco. She reports that she drinks alcohol.  She reports that she does not use drugs.  Review of Systems: Constitutional: Negative for fever malaise or anorexia Cardiovascular: negative for chest pain Respiratory: negative for SOB or persistent cough Gastrointestinal: negative for abdominal pain  Objective  Vitals: BP 128/80   Pulse 72   Temp 98.1 F (36.7 C)   Ht 5\' 3"  (1.6 m)   Wt 195 lb 12.8 oz (88.8 kg)   SpO2 98%   BMI 34.68 kg/m  General: no acute distress , A&Ox3 Cardiovascular:  RRR without murmur or gallop.  Respiratory:  Good breath sounds bilaterally, CTAB with normal respiratory effort Gastrointestinal: soft, flat abdomen, normal active bowel sounds, no palpable masses, no hepatosplenomegaly, no appreciated hernias GYN: Normal introitus, no rash, no mass, dry vaginal mucosa with decreased rugae, normal-appearing cervix, normal nontender fundus, no adnexal masses, no tenderness on exam.  No prolapse with Valsalva. Skin:  Warm, no rashes  Office Visit on 06/12/2018  Component Date Value Ref Range Status  . Color, UA 06/12/2018 Yellow   Final  . Clarity, UA 06/12/2018 Clear   Final  . Glucose, UA 06/12/2018 Negative  Negative Final  . Bilirubin, UA 06/12/2018 Negative   Final  . Ketones, UA 06/12/2018 Negative   Final  . Spec Grav, UA 06/12/2018 1.025  1.010 - 1.025 Final  . Blood, UA 06/12/2018 Negative   Final  . pH, UA 06/12/2018 5.0  5.0 - 8.0 Final  . Protein, UA 06/12/2018 Negative  Negative Final  . Urobilinogen, UA 06/12/2018 0.2  0.2 or 1.0 E.U./dL Final  . Nitrite, UA 06/12/2018 Negative   Final  . Leukocytes, UA 06/12/2018 Negative  Negative Final  . Odor 06/12/2018 None   Final      Commons side effects, risks, benefits, and alternatives for medications and treatment plan prescribed today were discussed, and the patient expressed understanding of the given instructions. Patient is instructed to call or message via MyChart if he/she has any questions or concerns regarding our treatment plan. No  barriers to understanding were identified. We discussed Red Flag symptoms and signs in detail. Patient expressed understanding regarding what to do in case of urgent or emergency type symptoms.   Medication list was reconciled, printed and provided to the patient in AVS. Patient instructions and summary information was reviewed with the patient as documented in the AVS. This note was prepared with assistance of Dragon voice recognition software. Occasional wrong-word or sound-a-like substitutions may have occurred due to the inherent limitations of voice recognition software

## 2018-06-12 NOTE — Telephone Encounter (Signed)
-----   Message from [name], MD sent at 06/12/2018  7:22 AM EDT ----- Normal LV function Erin West

## 2018-06-12 NOTE — Telephone Encounter (Signed)
Patient aware of order for event monitor. She states she will going out of town next Thursday and back on Monday.  RN informed patient to let scheduler know they can schedule once she returns. She verbalized understanding.

## 2018-06-12 NOTE — Telephone Encounter (Signed)
Continue coreg; check 30 d event monitor [name]

## 2018-06-19 ENCOUNTER — Encounter

## 2018-06-26 ENCOUNTER — Ambulatory Visit (INDEPENDENT_AMBULATORY_CARE_PROVIDER_SITE_OTHER): Payer: Medicare Other

## 2018-06-26 DIAGNOSIS — R002 Palpitations: Secondary | ICD-10-CM | POA: Diagnosis not present

## 2018-06-27 DIAGNOSIS — M65872 Other synovitis and tenosynovitis, left ankle and foot: Secondary | ICD-10-CM | POA: Diagnosis not present

## 2018-06-27 DIAGNOSIS — S92525D Nondisplaced fracture of medial phalanx of left lesser toe(s), subsequent encounter for fracture with routine healing: Secondary | ICD-10-CM | POA: Diagnosis not present

## 2018-06-27 DIAGNOSIS — M25572 Pain in left ankle and joints of left foot: Secondary | ICD-10-CM | POA: Diagnosis not present

## 2018-07-03 ENCOUNTER — Telehealth: Payer: Self-pay | Admitting: Cardiology

## 2018-07-03 DIAGNOSIS — R102 Pelvic and perineal pain: Secondary | ICD-10-CM | POA: Diagnosis not present

## 2018-07-03 DIAGNOSIS — I10 Essential (primary) hypertension: Secondary | ICD-10-CM | POA: Diagnosis not present

## 2018-07-03 DIAGNOSIS — N952 Postmenopausal atrophic vaginitis: Secondary | ICD-10-CM | POA: Diagnosis not present

## 2018-07-03 DIAGNOSIS — N951 Menopausal and female climacteric states: Secondary | ICD-10-CM | POA: Diagnosis not present

## 2018-07-03 NOTE — Telephone Encounter (Signed)
Spoke with pt. Pt sts that she had a reaction from the cardiac monitor leads. She called the 1-800 number and was sent hypoallergenic leads in the mail. She is still having whelps and itching  with the hypoallergenic leads. Adv pt that she could take Benadryl if she is itching,adv her not to drive or operate heavy machinery while taking it.  Adv pt that I will fwd a message to [facility] with monitors to f/u with her.

## 2018-07-03 NOTE — Telephone Encounter (Signed)
New Message        Patient is calling today about a rash that has developed from the strips that were prescribed. Patient is asking if she should discontinue use of strips and get something different.

## 2018-07-03 NOTE — Telephone Encounter (Signed)
Returned pt call.[email or web address] 

## 2018-07-09 ENCOUNTER — Ambulatory Visit: Payer: Medicare Other | Admitting: Family Medicine

## 2018-07-18 ENCOUNTER — Telehealth: Payer: Self-pay | Admitting: Cardiology

## 2018-07-18 DIAGNOSIS — R05 Cough: Secondary | ICD-10-CM | POA: Diagnosis not present

## 2018-07-18 DIAGNOSIS — R0600 Dyspnea, unspecified: Secondary | ICD-10-CM | POA: Diagnosis not present

## 2018-07-18 DIAGNOSIS — I10 Essential (primary) hypertension: Secondary | ICD-10-CM | POA: Diagnosis not present

## 2018-07-18 NOTE — Telephone Encounter (Signed)
New Message   Pt c/o medication issue:  1. Name of Medication: carvedilol (COREG) 25 MG tablet  2. How are you currently taking this medication (dosage and times per day)? Take 1 tablet (25 mg total) by mouth 2 (two) times daily  3. Are you having a reaction (difficulty breathing--STAT)?   4. What is your medication issue? Pt states she has a cough and upper back aches and thinks its coming from this medication

## 2018-07-18 NOTE — Telephone Encounter (Signed)
Spoke with pt. Pt sts that she has been having increase SOB since Dr.Crenshaw increased her Carvedilol. Pt sts that she has a chronic cough and has been in her upper back that she feels is related to the medication. She reports recent exposure to pneumonia. She denies fever, chills. Her cough is non-productive. Adv pt that Carvedilol could possible cause sob, but it is unlikely related to her cough or back pain. Recommended that she f/u with her pcp for further evaluation. Adv pt that I will fwd an update to [facility] and we will give her a call back with his recommendation. Pt agreeable with plan.

## 2018-07-20 NOTE — Telephone Encounter (Signed)
Would continue coreg until seen by primary care; if no improvement in symptoms following eval/treatment by primary care, can resume previous dose Erin West

## 2018-07-21 NOTE — Telephone Encounter (Signed)
Spoke with pt, she continues with a cough and SOB. She has an appointment with her medical doctor Thursday this week and will let us know after that evaluation. Aware of dr Jacalyn Lefevre recommendations.

## 2018-07-24 DIAGNOSIS — R05 Cough: Secondary | ICD-10-CM | POA: Diagnosis not present

## 2018-07-24 DIAGNOSIS — I10 Essential (primary) hypertension: Secondary | ICD-10-CM | POA: Diagnosis not present

## 2018-07-25 DIAGNOSIS — R05 Cough: Secondary | ICD-10-CM | POA: Diagnosis not present

## 2018-08-20 DIAGNOSIS — I781 Nevus, non-neoplastic: Secondary | ICD-10-CM | POA: Diagnosis not present

## 2018-08-20 DIAGNOSIS — L720 Epidermal cyst: Secondary | ICD-10-CM | POA: Diagnosis not present

## 2018-08-20 DIAGNOSIS — L814 Other melanin hyperpigmentation: Secondary | ICD-10-CM | POA: Diagnosis not present

## 2018-08-20 DIAGNOSIS — L821 Other seborrheic keratosis: Secondary | ICD-10-CM | POA: Diagnosis not present

## 2018-09-02 ENCOUNTER — Ambulatory Visit: Payer: Medicare Other | Admitting: Family Medicine

## 2018-09-03 ENCOUNTER — Other Ambulatory Visit: Payer: Self-pay

## 2018-09-03 ENCOUNTER — Encounter: Payer: Self-pay | Admitting: Family Medicine

## 2018-09-03 ENCOUNTER — Ambulatory Visit (INDEPENDENT_AMBULATORY_CARE_PROVIDER_SITE_OTHER): Payer: Medicare Other | Admitting: Family Medicine

## 2018-09-03 VITALS — BP 130/88 | HR 66 | Temp 98.1°F | Resp 14 | Ht 63.0 in | Wt 199.0 lb

## 2018-09-03 DIAGNOSIS — N3281 Overactive bladder: Secondary | ICD-10-CM | POA: Diagnosis not present

## 2018-09-03 DIAGNOSIS — R350 Frequency of micturition: Secondary | ICD-10-CM | POA: Diagnosis not present

## 2018-09-03 DIAGNOSIS — N952 Postmenopausal atrophic vaginitis: Secondary | ICD-10-CM

## 2018-09-03 LAB — POCT URINALYSIS DIPSTICK
Bilirubin, UA: NEGATIVE
Blood, UA: NEGATIVE
Glucose, UA: NEGATIVE
KETONES UA: NEGATIVE
Leukocytes, UA: NEGATIVE
Nitrite, UA: NEGATIVE
PH UA: 5 (ref 5.0–8.0)
Protein, UA: NEGATIVE
SPEC GRAV UA: 1.02 (ref 1.010–1.025)
Urobilinogen, UA: 0.2 E.U./dL

## 2018-09-03 MED ORDER — ATORVASTATIN CALCIUM 20 MG PO TABS: 20.0000 mg | ORAL_TABLET | Freq: Every day | ORAL | 3 refills | Status: DC

## 2018-09-03 MED ORDER — OXYBUTYNIN CHLORIDE ER 10 MG PO TB24: 10.0000 mg | ORAL_TABLET | Freq: Every day | ORAL | 2 refills | Status: DC

## 2018-09-03 NOTE — Patient Instructions (Addendum)
Please return in late January or early February 2020 for your annual complete physical; please come fasting.  AWV due at that time as well.   Please use the estrogen cream regularly to see if helps both of your symptoms. You may try the new medication if the symptoms persist.   If you have any questions or concerns, please don't hesitate to send me a message via MyChart or call the office at [phone]. Thank you for visiting with [place] today! It's our pleasure caring for you.   Overactive Bladder, Adult Overactive bladder is a group of urinary symptoms. With overactive bladder, you may suddenly feel the need to pass urine (urinate) right away. After feeling this sudden urge, you might also leak urine if you cannot get to the bathroom fast enough (urinary incontinence). These symptoms might interfere with your daily work or social activities. Overactive bladder symptoms may also wake you up at night. Overactive bladder affects the nerve signals between your bladder and your brain. Your bladder may get the signal to empty before it is full. Very sensitive muscles can also make your bladder squeeze too soon. What are the causes? Many things can cause an overactive bladder. Possible causes include:  Urinary tract infection.  Infection of nearby tissues, such as the prostate.  Prostate enlargement.  Being pregnant with twins or more (multiples).  Surgery on the uterus or urethra.  Bladder stones, inflammation, or tumors.  Drinking too much caffeine or alcohol.  Certain medicines, especially those that you take to help your body get rid of extra fluid (diuretics) by increasing urine production.  Muscle or nerve weakness, especially from: ? A spinal cord injury. ? Stroke. ? Multiple sclerosis. ? Parkinson disease.  Diabetes. This can cause a high urine volume that fills the bladder so quickly that the normal urge to urinate is triggered very strongly.  Constipation. A buildup of too  much stool can put pressure on your bladder.  What increases the risk? You may be at greater risk for overactive bladder if you:  Are an older adult.  Smoke.  Are going through menopause.  Have prostate problems.  Have a neurological disease, such as stroke, dementia, Parkinson disease, or multiple sclerosis (MS).  Eat or drink things that irritate the bladder. These include alcohol, spicy food, and caffeine.  Are overweight or obese.  What are the signs or symptoms? The signs and symptoms of an overactive bladder include:  Sudden, strong urges to urinate.  Leaking urine.  Urinating eight or more times per day.  Waking up to urinate two or more times per night.  How is this diagnosed? Your health care provider may suspect overactive bladder based on your symptoms. The health care provider will do a physical exam and take your medical history. Blood or urine tests may also be done. For example, you might need to have a bladder function test to check how well you can hold your urine. You might also need to see a health care provider who specializes in the urinary tract (urologist). How is this treated? Treatment for overactive bladder depends on the cause of your condition and whether it is mild or severe. Certain treatments can be done in your health care provider's office or clinic. You can also make lifestyle changes at home. Options include: Behavioral Treatments  Biofeedback. A specialist uses sensors to help you become aware of your body's signals.  Keeping a daily log of when you need to urinate and what happens after the  urge. This may help you manage your condition.  Bladder training. This helps you learn to control the urge to urinate by following a schedule that directs you to urinate at regular intervals (timed voiding). At first, you might have to wait a few minutes after feeling the urge. In time, you should be able to schedule bathroom visits an hour or more  apart.  Kegel exercises. These are exercises to strengthen the pelvic floor muscles, which support the bladder. Toning these muscles can help you control urination, even if your bladder muscles are overactive. A specialist will teach you how to do these exercises correctly. They require daily practice.  Weight loss. If you are obese or overweight, losing weight might relieve your symptoms of overactive bladder. Talk to your health care provider about losing weight and whether there is a specific program or method that would work best for you.  Diet change. This might help if constipation is making your overactive bladder worse. Your health care provider or a dietitian can explain ways to change what you eat to ease constipation. You might also need to consume less alcohol and caffeine or drink other fluids at different times of the day.  Stopping smoking.  Wearing pads to absorb leakage while you wait for other treatments to take effect. Physical Treatments  Electrical stimulation. Electrodes send gentle pulses of electricity to strengthen the nerves or muscles that help to control the bladder. Sometimes, the electrodes are placed outside of the body. In other cases, they might be placed inside the body (implanted). This treatment can take several months to have an effect.  Supportive devices. Women may need a plastic device that fits into the vagina and supports the bladder (pessary). Medicines Several medicines can help treat overactive bladder and are usually used along with other treatments. Some are injected into the muscles involved in urination. Others come in pill form. Your health care provider may prescribe:  Antispasmodics. These medicines block the signals that the nerves send to the bladder. This keeps the bladder from releasing urine at the wrong time.  Tricyclic antidepressants. These types of antidepressants also relax bladder muscles.  Surgery  You may have a device  implanted to help manage the nerve signals that indicate when you need to urinate.  You may have surgery to implant electrodes for electrical stimulation.  Sometimes, very severe cases of overactive bladder require surgery to change the shape of the bladder. Follow these instructions at home:  Take medicines only as directed by your health care provider.  Use any implants or a pessary as directed by your health care provider.  Make any diet or lifestyle changes that are recommended by your health care provider. These might include: ? Drinking less fluid or drinking at different times of the day. If you need to urinate often during the night, you may need to stop drinking fluids early in the evening. ? Cutting down on caffeine or alcohol. Both can make an overactive bladder worse. Caffeine is found in coffee, tea, and sodas. ? Doing Kegel exercises to strengthen muscles. ? Losing weight if you need to. ? Eating a healthy and balanced diet to prevent constipation.  Keep a journal or log to track how much and when you drink and also when you feel the need to urinate. This will help your health care provider to monitor your condition. Contact a health care provider if:  Your symptoms do not get better after treatment.  Your pain and discomfort are getting  worse.  You have more frequent urges to urinate.  You have a fever. Get help right away if: You are not able to control your bladder at all. This information is not intended to replace advice given to you by your health care provider. Make sure you discuss any questions you have with your health care provider. Document Released: 07/07/2009 Document Revised: 02/16/2016 Document Reviewed: 02/03/2014 Elsevier Interactive Patient Education  [employer].

## 2018-09-03 NOTE — Progress Notes (Signed)
Subjective  CC:  Chief Complaint  Patient presents with  . Pelvic Pain  . Urinary Frequency    X's 2-3 weeks    HPI: Erin West is a 67 y.o. female who presents to the office today to address the problems listed above in the chief complaint.  67 year old presents with 2 to 3-week history of overactive bladder symptoms including urinary frequency and poor emptying.  She denies irritative symptoms.  No dysuria, gross hematuria or vaginal discharge.  In September she was here for vaginal pain.  I reviewed the note from gynecology.  Atrophic vaginitis was diagnosed.  She is using vaginal estrogen only intermittently because she hates the mass.  She denies further vaginal symptoms. Assessment  1. Urinary frequency      Plan   Overactive bladder symptoms: Trial of medication.  Recommend hydration.  Education given  Atrophic vaginitis: Recommend continuing estrogen use.  This should help both of her symptoms.  She seems hesitant.  Follow up: Return in about 8 weeks (around 10/29/2018) for complete physical, AWV.   Orders Placed This Encounter  Procedures  . POCT Urinalysis Dipstick   Meds ordered this encounter  Medications  . atorvastatin (LIPITOR) 20 MG tablet    Sig: Take 1 tablet (20 mg total) by mouth daily.    Dispense:  90 tablet    Refill:  3  . oxybutynin (DITROPAN XL) 10 MG 24 hr tablet    Sig: Take 1 tablet (10 mg total) by mouth at bedtime.    Dispense:  30 tablet    Refill:  2      I reviewed the patients updated PMH, FH, and SocHx.    Patient Active Problem List   Diagnosis Date Noted  . Mixed hyperlipidemia 12/04/2017  . Chronic seasonal allergic rhinitis 12/04/2017  . Obesity, Class I, BMI 30-34.9 10/01/2017  . Chronic pansinusitis 09/09/2017  . Laryngopharyngeal reflux (LPR) 09/09/2017  . Persistent cough 07/24/2017  . Status post total right knee replacement 01/24/2016  . Bilateral leg edema 03/30/2014  . History of gastritis 02/10/2014  .  Dysphagia 01/28/2014  . Irritable bowel syndrome (IBS) 09/08/2013  . Osteopenia 07/14/2013  . Osteoarthritis, multiple sites 07/14/2013  . Essential hypertension   . Gastroesophageal reflux disease without esophagitis    Current Meds  Medication Sig  . atorvastatin (LIPITOR) 20 MG tablet Take 1 tablet (20 mg total) by mouth daily.  Marland Kitchen azelastine (ASTELIN) 0.1 % nasal spray Place 2 sprays into both nostrils 2 (two) times daily. Use in each nostril as directed  . carvedilol (COREG) 25 MG tablet Take 1 tablet (25 mg total) by mouth 2 (two) times daily.  . Cholecalciferol (VITAMIN D) 2000 UNITS CAPS Take 2,000 Units by mouth daily.   . fluticasone (FLONASE) 50 MCG/ACT nasal spray USE 1 SPRAY NASALLY DAILY  . levocetirizine (XYZAL) 5 MG tablet Take 1 tablet (5 mg total) by mouth every evening.  . linaclotide (LINZESS) 145 MCG CAPS capsule   . Multiple Vitamin (MULTIVITAMIN) tablet Take 1 tablet by mouth daily.  Marland Kitchen omeprazole (PRILOSEC) 20 MG capsule Take by mouth.  . polyethylene glycol powder (GLYCOLAX/MIRALAX) powder Take 17 g by mouth daily as needed.  . Probiotic Product (PROBIOTIC PO) Take by mouth.  . [DISCONTINUED] atorvastatin (LIPITOR) 20 MG tablet Take 20 mg by mouth daily.    Allergies: Patient is allergic to penicillins; sulfa antibiotics; sulfasalazine; tramadol; ciprofloxacin; trimethoprim; and isovue [iopamidol]. Family History: Patient family history includes Arthritis in her father and  mother; Heart disease in her mother; Mental illness in her father; Osteoporosis in her mother; Other in her mother; Stroke in her paternal grandfather; Sudden death in her father. Social History:  Patient  reports that she has never smoked. She has never used smokeless tobacco. She reports current alcohol use. She reports that she does not use drugs.  Review of Systems: Constitutional: Negative for fever malaise or anorexia Cardiovascular: negative for chest pain Respiratory: negative for SOB  or persistent cough Gastrointestinal: negative for abdominal pain  Objective  Vitals: BP 130/88   Pulse 66   Temp 98.1 F (36.7 C)   Resp 14   Ht 5\' 3"  (1.6 m)   Wt 199 lb (90.3 kg)   SpO2 95%   BMI 35.25 kg/m  General: no acute distress , A&Ox3 HEENT: PEERL, conjunctiva normal, Oropharynx moist,neck is supple Cardiovascular:  RRR without murmur or gallop.  Respiratory:  Good breath sounds bilaterally, CTAB with normal respiratory effort Skin:  Warm, no rashes Gastrointestinal: soft, flat abdomen, normal active bowel sounds, no palpable masses, no hepatosplenomegaly, no appreciated hernias Without suprapubic tenderness, no flank pain  Office Visit on 09/03/2018  Component Date Value Ref Range Status  . Color, UA 09/03/2018 yellow   Final  . Clarity, UA 09/03/2018 clear   Final  . Glucose, UA 09/03/2018 Negative  Negative Final  . Bilirubin, UA 09/03/2018 negative   Final  . Ketones, UA 09/03/2018 negative   Final  . Spec Grav, UA 09/03/2018 1.020  1.010 - 1.025 Final  . Blood, UA 09/03/2018 negative   Final  . pH, UA 09/03/2018 5.0  5.0 - 8.0 Final  . Protein, UA 09/03/2018 Negative  Negative Final  . Urobilinogen, UA 09/03/2018 0.2  0.2 or 1.0 E.U./dL Final  . Nitrite, UA 09/03/2018 negative   Final  . Leukocytes, UA 09/03/2018 Negative  Negative Final      Commons side effects, risks, benefits, and alternatives for medications and treatment plan prescribed today were discussed, and the patient expressed understanding of the given instructions. Patient is instructed to call or message via MyChart if he/she has any questions or concerns regarding our treatment plan. No barriers to understanding were identified. We discussed Red Flag symptoms and signs in detail. Patient expressed understanding regarding what to do in case of urgent or emergency type symptoms.   Medication list was reconciled, printed and provided to the patient in AVS. Patient instructions and summary  information was reviewed with the patient as documented in the AVS. This note was prepared with assistance of Dragon voice recognition software. Occasional wrong-word or sound-a-like substitutions may have occurred due to the inherent limitations of voice recognition software

## 2018-09-04 ENCOUNTER — Encounter: Payer: Self-pay | Admitting: Family Medicine

## 2018-10-02 DIAGNOSIS — H40013 Open angle with borderline findings, low risk, bilateral: Secondary | ICD-10-CM | POA: Diagnosis not present

## 2018-10-21 NOTE — Progress Notes (Signed)
Subjective:   Erin West is a 68 y.o. female who presents for Medicare Annual (Subsequent) preventive examination.  Review of Systems:  No ROS.  Medicare Wellness Visit. Additional risk factors are reflected in the social history.  Cardiac Risk Factors include: advanced age (>26men, >75 women);obesity (BMI >30kg/m2);hypertension;dyslipidemia;family history of premature cardiovascular disease   Sleep patterns: Sleeps 6-7 hours, feels rested. Up to void x 2.  Home Safety/Smoke Alarms: Feels safe in home. Smoke alarms in place.  Living environment; residence and Firearm Safety: Lives with husband in 1 story home.  Seat Belt Safety/Bike Helmet: Wears seat belt.   Female:   Pap-2017      Mammo-11/20/2017, negative. Ordered today. [facility] Dexa scan-11/19/2016, pt reports normal. Ordered today.  [facility]       CCS-08/03/2016, normal. Dr. Collene Mares     Objective:     Vitals: BP (!) 150/92 (BP Location: Left Arm, Patient Position: Sitting, Cuff Size: Normal)   Pulse 74   Temp 98.5 F (36.9 C) (Temporal)   Resp 16   Ht 5\' 3"  (1.6 m)   Wt 200 lb (90.7 kg)   SpO2 97%   BMI 35.43 kg/m   Body mass index is 35.43 kg/m.  Advanced Directives 10/22/2018 02/01/2016 02/22/2012  Does Patient Have a Medical Advance Directive? Yes Yes Patient has advance directive, copy not in chart  Type of Advance Directive [place];Living will Living will [place];Living will  Copy of [place] in Chart? No - copy requested - Copy requested from family    Tobacco Social History   Tobacco Use  Smoking Status Never Smoker  Smokeless Tobacco Never Used     Counseling given: Not Answered     Past Medical History:  Diagnosis Date  . Anxiety   . Arthritis   . Aspiration pneumonia ([place]) 2013  . Atypical chest pain    Stress test 04/21/10 - post-stress EF=89%. Normal scan.  . Diarrhea   . Diastolic dysfunction    But normal LV function on ECHO  2011 and low risk Myoview in 2011  . DVT (deep venous thrombosis) ([place])   . Dyslipidemia   . GERD (gastroesophageal reflux disease)   . Hypertension   . Osteoarthritis   . Osteopenia   . Palpitations    [employer] MCT Monitor 04/16/10-04/22/10   . Syncope    Pain-mediated syncope   Past Surgical History:  Procedure Laterality Date  . CHOLECYSTECTOMY  2006  . KNEE ARTHROSCOPY Right   . REPLACEMENT TOTAL KNEE Left    left  . TUBAL LIGATION  06/1978   Family History  Problem Relation Age of Onset  . Heart disease Mother   . Other Mother        leg amputation  . Arthritis Mother   . Osteoporosis Mother   . Mental illness Father   . Sudden death Father   . Arthritis Father   . Stroke Paternal Grandfather    Social History   Socioeconomic History  . Marital status: Married    Spouse name: Not on file  . Number of children: 3  . Years of education: Not on file  . Highest education level: Not on file  Occupational History  . Not on file  Social Needs  . Financial resource strain: Not on file  . Food insecurity:    Worry: Not on file    Inability: Not on file  . Transportation needs:    Medical: Not on  file    Non-medical: Not on file  Tobacco Use  . Smoking status: Never Smoker  . Smokeless tobacco: Never Used  Substance and Sexual Activity  . Alcohol use: Yes    Alcohol/week: 0.0 standard drinks    Comment: 2-3/wine per month  . Drug use: No  . Sexual activity: Never  Lifestyle  . Physical activity:    Days per week: Not on file    Minutes per session: Not on file  . Stress: Not on file  Relationships  . Social connections:    Talks on phone: Not on file    Gets together: Not on file    Attends religious service: Not on file    Active member of club or organization: Not on file    Attends meetings of clubs or organizations: Not on file    Relationship status: Not on file  Other Topics Concern  . Not on file  Social History Narrative  . Not on file     Outpatient Encounter Medications as of 10/22/2018  Medication Sig  . atorvastatin (LIPITOR) 20 MG tablet Take 1 tablet (20 mg total) by mouth daily.  Marland Kitchen azelastine (ASTELIN) 0.1 % nasal spray Place 2 sprays into both nostrils 2 (two) times daily. Use in each nostril as directed  . carvedilol (COREG) 25 MG tablet Take 1 tablet (25 mg total) by mouth 2 (two) times daily.  . cetirizine (ZYRTEC ALLERGY) 10 MG tablet Take 10 mg by mouth daily.  . Cholecalciferol (VITAMIN D) 2000 UNITS CAPS Take 2,000 Units by mouth daily.   . clindamycin (CLEOCIN) 300 MG capsule TK 2 CS PO 30 TO 45 MINUTES PRIOR TO CLEANING/PROCEDURE  . ESTRACE VAGINAL 0.1 MG/GM vaginal cream   . fluticasone (FLONASE) 50 MCG/ACT nasal spray USE 1 SPRAY NASALLY DAILY  . linaclotide (LINZESS) 145 MCG CAPS capsule   . Multiple Vitamin (MULTIVITAMIN) tablet Take 1 tablet by mouth daily.  Marland Kitchen omeprazole (PRILOSEC) 20 MG capsule Take by mouth.  . polyethylene glycol powder (GLYCOLAX/MIRALAX) powder Take 17 g by mouth daily as needed.  . Probiotic Product (PROBIOTIC PO) Take by mouth.  . levocetirizine (XYZAL) 5 MG tablet Take 1 tablet (5 mg total) by mouth every evening. (Patient not taking: Reported on 10/22/2018)  . oxybutynin (DITROPAN XL) 10 MG 24 hr tablet Take 1 tablet (10 mg total) by mouth at bedtime. (Patient not taking: Reported on 10/22/2018)  . [DISCONTINUED] albuterol (PROVENTIL HFA;VENTOLIN HFA) 108 (90 Base) MCG/ACT inhaler Inhale 2 puffs into the lungs every 6 (six) hours as needed for wheezing or shortness of breath.    No facility-administered encounter medications on file as of 10/22/2018.     Activities of Daily Living In your present state of health, do you have any difficulty performing the following activities: 10/22/2018 12/04/2017  Hearing? N N  Vision? N N  Difficulty concentrating or making decisions? N N  Walking or climbing stairs? N N  Dressing or bathing? N N  Doing errands, shopping? N N  Preparing  Food and eating ? N -  Using the Toilet? N -  In the past six months, have you accidently leaked urine? N -  Do you have problems with loss of bowel control? N -  Managing your Medications? N -  Managing your Finances? N -  Housekeeping or managing your Housekeeping? N -  Some recent data might be hidden    Patient Care Team: Leamon Arnt, MD as PCP - General (Family Medicine) Laurance Flatten,  Michela Pitcher., MD as Consulting Physician (Orthopedic Surgery) Daryll Brod, MD as Consulting Physician (Orthopedic Surgery) Juanita Craver, MD as Consulting Physician (Gastroenterology) Inocencio Homes, DPM as Consulting Physician (Podiatry) Stanford Breed, Denice Bors, MD as Consulting Physician (Cardiology) Milas Hock Stephani Police, NP (Nurse Practitioner)    Assessment:   This is a routine wellness examination for Andrina.  Exercise Activities and Dietary recommendations Current Exercise Habits: Structured exercise class(water aerobics), Type of exercise: Other - see comments(Silver sneakers and water aerobics), Time (Minutes): 60, Frequency (Times/Week): 4, Weekly Exercise (Minutes/Week): 240, Exercise limited by: None identified   Diet (meal preparation, eat out, water intake, caffeinated beverages, dairy products, fruits and vegetables): Drinks sweet tea, water (little), cranberry juice and coffee.   Breakfast: cereal Lunch: sandwich; salad Dinner: protein and vegetables   Goals    . Weight (lb) < 185 lb (83.9 kg)     Lose weight by decreasing caloric intake and increasing water.        Fall Risk Fall Risk  10/22/2018 12/04/2017  Falls in the past year? 0 No  Comment Tripped (did not fall), broke 2 toes -    Depression Screen PHQ 2/9 Scores 10/22/2018 12/04/2017 12/04/2017  PHQ - 2 Score 0 0 0  PHQ- 9 Score - - 0     Cognitive Function MMSE - Mini Mental State Exam 10/22/2018  Orientation to time 5  Orientation to Place 5  Registration 3  Attention/ Calculation 5  Recall 3  Language- name 2 objects 2   Language- repeat 1  Language- follow 3 step command 3  Language- read & follow direction 1  Write a sentence 1  Copy design 1  Total score 30        Immunization History  Administered Date(s) Administered  . Influenza Split 06/24/2013  . Influenza, High Dose Seasonal PF 05/23/2016, 06/28/2017, 06/12/2018  . Influenza, Seasonal, Injecte, Preservative Fre 05/27/2014, 07/11/2015  . Pneumococcal Conjugate-13 10/29/2016  . Pneumococcal Polysaccharide-23 07/25/2012, 11/06/2017  . Tdap 09/24/2010, 12/19/2015  . Zoster 09/25/2011  . Zoster Recombinat (Shingrix) 08/09/2017, 11/06/2017     Screening Tests Health Maintenance  Topic Date Due  . DEXA SCAN  11/19/2018  . MAMMOGRAM  11/20/2018  . TETANUS/TDAP  12/18/2025  . COLONOSCOPY  08/03/2026  . INFLUENZA VACCINE  Completed  . Hepatitis C Screening  Completed  . PNA vac Low Risk Adult  Completed        Plan:    Schedule mammogram and bone scan after 11/23/2018.   Bring a copy of your living will and/or healthcare power of attorney to your next office visit.  Continue doing brain stimulating activities (puzzles, reading, adult coloring books, staying active) to keep memory sharp.   I have personally reviewed and noted the following in the patient's chart:   . Medical and social history . Use of alcohol, tobacco or illicit drugs  . Current medications and supplements . Functional ability and status . Nutritional status . Physical activity . Advanced directives . List of other physicians . Hospitalizations, surgeries, and ER visits in previous 12 months . Vitals . Screenings to include cognitive, depression, and falls . Referrals and appointments  In addition, I have reviewed and discussed with patient certain preventive protocols, quality metrics, and best practice recommendations. A written personalized care plan for preventive services as well as general preventive health recommendations were provided to patient.      Gerilyn Nestle, RN  10/22/2018

## 2018-10-22 ENCOUNTER — Ambulatory Visit: Payer: Medicare Other

## 2018-10-22 ENCOUNTER — Ambulatory Visit (INDEPENDENT_AMBULATORY_CARE_PROVIDER_SITE_OTHER): Payer: Medicare Other | Admitting: Family Medicine

## 2018-10-22 ENCOUNTER — Encounter: Payer: Self-pay | Admitting: Family Medicine

## 2018-10-22 ENCOUNTER — Other Ambulatory Visit: Payer: Self-pay

## 2018-10-22 ENCOUNTER — Ambulatory Visit (INDEPENDENT_AMBULATORY_CARE_PROVIDER_SITE_OTHER): Payer: Medicare Other

## 2018-10-22 VITALS — BP 150/92 | HR 74 | Temp 98.5°F | Resp 16 | Ht 63.0 in | Wt 200.0 lb

## 2018-10-22 DIAGNOSIS — E669 Obesity, unspecified: Secondary | ICD-10-CM | POA: Diagnosis not present

## 2018-10-22 DIAGNOSIS — R053 Chronic cough: Secondary | ICD-10-CM

## 2018-10-22 DIAGNOSIS — Z1231 Encounter for screening mammogram for malignant neoplasm of breast: Secondary | ICD-10-CM | POA: Diagnosis not present

## 2018-10-22 DIAGNOSIS — I10 Essential (primary) hypertension: Secondary | ICD-10-CM | POA: Diagnosis not present

## 2018-10-22 DIAGNOSIS — N3281 Overactive bladder: Secondary | ICD-10-CM

## 2018-10-22 DIAGNOSIS — E782 Mixed hyperlipidemia: Secondary | ICD-10-CM | POA: Diagnosis not present

## 2018-10-22 DIAGNOSIS — R600 Localized edema: Secondary | ICD-10-CM | POA: Diagnosis not present

## 2018-10-22 DIAGNOSIS — Z1239 Encounter for other screening for malignant neoplasm of breast: Secondary | ICD-10-CM | POA: Diagnosis not present

## 2018-10-22 DIAGNOSIS — R05 Cough: Secondary | ICD-10-CM

## 2018-10-22 DIAGNOSIS — E2839 Other primary ovarian failure: Secondary | ICD-10-CM

## 2018-10-22 DIAGNOSIS — Z0000 Encounter for general adult medical examination without abnormal findings: Secondary | ICD-10-CM

## 2018-10-22 DIAGNOSIS — N952 Postmenopausal atrophic vaginitis: Secondary | ICD-10-CM | POA: Insufficient documentation

## 2018-10-22 DIAGNOSIS — K219 Gastro-esophageal reflux disease without esophagitis: Secondary | ICD-10-CM | POA: Diagnosis not present

## 2018-10-22 LAB — CBC WITH DIFFERENTIAL/PLATELET
BASOS ABS: 0.1 10*3/uL (ref 0.0–0.1)
BASOS PCT: 1.3 % (ref 0.0–3.0)
EOS ABS: 0.1 10*3/uL (ref 0.0–0.7)
Eosinophils Relative: 3.1 % (ref 0.0–5.0)
HCT: 43.2 % (ref 36.0–46.0)
Hemoglobin: 14.6 g/dL (ref 12.0–15.0)
LYMPHS ABS: 1.4 10*3/uL (ref 0.7–4.0)
Lymphocytes Relative: 32.5 % (ref 12.0–46.0)
MCHC: 33.8 g/dL (ref 30.0–36.0)
MCV: 90.9 fl (ref 78.0–100.0)
MONO ABS: 0.4 10*3/uL (ref 0.1–1.0)
Monocytes Relative: 8.1 % (ref 3.0–12.0)
NEUTROS ABS: 2.4 10*3/uL (ref 1.4–7.7)
NEUTROS PCT: 55 % (ref 43.0–77.0)
PLATELETS: 248 10*3/uL (ref 150.0–400.0)
RBC: 4.75 Mil/uL (ref 3.87–5.11)
RDW: 13.6 % (ref 11.5–15.5)
WBC: 4.4 10*3/uL (ref 4.0–10.5)

## 2018-10-22 LAB — POCT URINALYSIS DIPSTICK
Bilirubin, UA: NEGATIVE
Glucose, UA: NEGATIVE
Ketones, UA: NEGATIVE
Leukocytes, UA: NEGATIVE
Nitrite, UA: NEGATIVE
PH UA: 5 (ref 5.0–8.0)
Protein, UA: NEGATIVE
RBC UA: NEGATIVE
Spec Grav, UA: >=1.03 — AB (ref 1.010–1.025)
UROBILINOGEN UA: 0.2 U/dL

## 2018-10-22 LAB — LIPID PANEL
CHOLESTEROL: 157 mg/dL (ref 0–200)
HDL: 51.8 mg/dL (ref >39.00)
LDL Cholesterol: 90 mg/dL (ref 0–99)
NonHDL: 105.03
TRIGLYCERIDES: 73 mg/dL (ref 0.0–149.0)
Total CHOL/HDL Ratio: 3
VLDL: 14.6 mg/dL (ref 0.0–40.0)

## 2018-10-22 LAB — COMPREHENSIVE METABOLIC PANEL
ALK PHOS: 126 U/L — AB (ref 39–117)
ALT: 21 U/L (ref 0–35)
AST: 18 U/L (ref 0–37)
Albumin: 4.2 g/dL (ref 3.5–5.2)
BILIRUBIN TOTAL: 0.9 mg/dL (ref 0.2–1.2)
BUN: 24 mg/dL — ABNORMAL HIGH (ref 6–23)
CALCIUM: 9.5 mg/dL (ref 8.4–10.5)
CHLORIDE: 106 meq/L (ref 96–112)
CO2: 27 meq/L (ref 19–32)
CREATININE: 0.78 mg/dL (ref 0.40–1.20)
GFR: 73.49 mL/min (ref >60.00)
Glucose, Bld: 98 mg/dL (ref 70–99)
Potassium: 4.4 mEq/L (ref 3.5–5.1)
SODIUM: 140 meq/L (ref 135–145)
Total Protein: 6.9 g/dL (ref 6.0–8.3)

## 2018-10-22 LAB — TSH: TSH: 2.48 u[IU]/mL (ref 0.35–4.50)

## 2018-10-22 MED ORDER — LOSARTAN POTASSIUM 50 MG PO TABS: ORAL_TABLET | ORAL | 3 refills | Status: DC

## 2018-10-22 MED ORDER — CETIRIZINE HCL 10 MG PO TABS: 10.0000 mg | ORAL_TABLET | Freq: Every day | ORAL | 3 refills | Status: DC

## 2018-10-22 NOTE — Progress Notes (Signed)
Subjective  Chief Complaint  Patient presents with  . Annual Exam    HPI: Erin West is a 68 y.o. female who presents to [place] at [facility] today for a Female Wellness Visit. This is a medicare cpe with labs (OV). Had AWV today with Erin West.   Wellness Visit: annual visit with health maintenance review and exam without Pap   HM: mammogram and dexa scheduled for February. imms are up to date.   Diet: fair. Weight is stable. Little exercise.   AWV: no mood or cognitive dysfunction. Low fall risk.   Chronic disease f/u and/or acute problem visit: (deemed necessary to be done in addition to the wellness visit):  HTN: elevated over the last several office visits: I reviewed records from recent GYN and Cards visits as well. On carvedilol 25bid. Had been on ARB but was stopped in April 2019 to see if improved chronic cough (didn't) and then again had been put on micardis for HTN but BP became too low on bid carvedilol so it was stopped in September. Since, Bps have been trending upwards. Feels fine. Palpitations have been controlled on BB.   OAB: trial of ditropan failed due to SE: dry mouth. Helped with sxs. Not ready to try an alternative.   Chronic cough: persists. Needs refill of allergy meds. Negative thorough eval documented in PL.   GERD and edema are well controlled.   Fasting for labs today.   Assessment  1. Mixed hyperlipidemia   2. Bilateral leg edema   3. Essential hypertension   4. Gastroesophageal reflux disease without esophagitis   5. Severe obesity (BMI 35.0-39.9) with comorbidity (HCC) Chronic  6. Overactive bladder   7. Chronic cough      Plan  Female Wellness Visit:  Age appropriate Health Maintenance and Prevention measures were discussed with patient. Included topics are cancer screening recommendations, ways to keep healthy (see AVS) including dietary and exercise recommendations, regular eye and dental care, use of seat belts, and  avoidance of moderate alcohol use and tobacco use. To have mammo and dex in February.   BMI: discussed patient's BMI and encouraged positive lifestyle modifications to help get to or maintain a target BMI.  HM needs and immunizations were addressed and ordered. See below for orders. See HM and immunization section for updates.  Routine labs and screening tests ordered including cmp, cbc and lipids where appropriate.  Discussed recommendations regarding Vit D and calcium supplementation (see AVS)  Chronic disease management visit and/or acute problem visit:  HTN: uncontrolled. Add back losartan, start at low dose and titrate up to 50. Will have it rechecked at her f/u cards appt in the next month or two. F/u here if any problems or sxs of low bp. Check renal function and electrolytes.  HLD: recheck lipids today. Onstatin. Check lfts  OAB - pt will monitor sxs and let me know if she'd like to try vesicare or myrbetriq.   Chronic cough: refilled allergy meds  Follow up: Return in about 6 months (around 04/22/2019) for follow up Hypertension.  Orders Placed This Encounter  Procedures  . CBC with Differential/Platelet  . Comprehensive metabolic panel  . Lipid panel  . TSH  . POCT urinalysis dipstick   Meds ordered this encounter  Medications  . losartan (COZAAR) 50 MG tablet    Sig: Take 0.5 tablets (25 mg total) by mouth daily for 14 days, THEN 1 tablet (50 mg total) daily.    Dispense:  90  tablet    Refill:  3  . cetirizine (ZYRTEC ALLERGY) 10 MG tablet    Sig: Take 1 tablet (10 mg total) by mouth daily.    Dispense:  90 tablet    Refill:  3      Lifestyle: Body mass index is 35.43 kg/m. Wt Readings from Last 3 Encounters:  10/22/18 200 lb (90.7 kg)  10/22/18 200 lb (90.7 kg)  09/03/18 199 lb (90.3 kg)   BP Readings from Last 3 Encounters:  10/22/18 (!) 150/92  10/22/18 (!) 150/92  09/03/18 130/88      Patient Active Problem List   Diagnosis Date Noted  .  Severe obesity (BMI 35.0-39.9) with comorbidity ([place]) 08/04/2018    Priority: High  . Mixed hyperlipidemia 12/04/2017    Priority: High    Last Assessment & Plan:  Compliant with statin, recheck today   . Chronic cough 07/24/2017    Priority: High     Persistent cough.  Ongoing for almost a year now.  Has seen ENT, pulmonology, gastroenterology without any clear diagnosis.  She reports worsening of cough in the morning with a.m. hoarseness and drainage.  She does have allergies.  She has seen an allergist many many years ago.  On Flonase and Zyrtec nightly.  CT scan of the sinuses was negative for infection.  PFTs were normal.  Chest x-ray was normal.  She is on chronic high-dose PPI and has been evaluated for esophageal dysmotility by Dr. Collene Mares.  They have not found a cause for her persistent cough.  She is on losartan Pulmonary consult January 2019, PFT normal No change off ARB (losartan).     . Essential hypertension     Priority: High    Overview:  Normal Stress Test 2012 by [facility]; ECHO EF 65% with mild diastolic dysfunction 04/2955   Last Assessment & Plan:  Blood pressure has remained stable. We'll continue losartan, carvedilol. Follow up annually  Normal Stress Test 2012 by [facility]; ECHO EF 65% with mild diastolic dysfunction 10/1306  Normal Stress Test 2012 by [facility]; ECHO EF 65% with mild diastolic dysfunction 02/5783  Last Assessment & Plan:  Blood pressure has remained stable. We'll continue losartan, carvedilol. Follow up annually  Last Assessment & Plan:  Blood pressure has remained stable. We'll continue losartan, carvedilol. Follow up annually   . Chronic seasonal allergic rhinitis 12/04/2017    Priority: Medium  . Chronic pansinusitis 09/09/2017    Priority: Medium  . Laryngopharyngeal reflux (LPR) 09/09/2017    Priority: Medium  . Bilateral leg edema 03/30/2014    Priority: Medium    Overview:  Normal labs; ECHO with EF 69% and mild diastolic dysfunction; doppler  pending on left.  Lasix 40 bid to manage in high humidity. Does fine during winter. TED hose   . History of gastritis 02/10/2014    Priority: Medium    Overview:  01/2014 dr. Collene Mares   . Dysphagia 01/28/2014    Priority: Medium    Overview:  Dr. Collene Mares - esophagus dysmobility and reflux on barium swallow.  Last Assessment & Plan:  Patient is reporting some dysphasia and acid reflux. I recommended she increased her omeprazole to 20 mg twice daily and schedule follow-up with GI   . Irritable bowel syndrome (IBS) 09/08/2013    Priority: Medium    Overview:  evaluation Dr. Collene Mares 2015 upper endoscopy with diffuse gastritis. Stopped nsaids.   . Osteopenia 07/14/2013    Priority: Medium    Overview:  DEXA 2014, mild  osteopenia, T = -1.56 lowest (almost normal); on calcium and vit D; + Fh of osteoporosis   . Osteoarthritis, multiple sites 07/14/2013    Priority: Medium    Overview:  Followed by [facility]; MRI done in past - DJD and spurs. S/p knee replacement. Dr. Jaynee Eagles  Hand injection:  Injections 2018, Dr. Lindell Noe   . Gastroesophageal reflux disease without esophagitis     Priority: Medium  . Atrophic vaginitis 10/22/2018    Priority: Low  . Status post total right knee replacement 01/24/2016    Priority: Low   Health Maintenance  Topic Date Due  . DEXA SCAN  11/19/2018  . MAMMOGRAM  11/20/2018  . TETANUS/TDAP  12/18/2025  . COLONOSCOPY  08/03/2026  . INFLUENZA VACCINE  Completed  . Hepatitis C Screening  Completed  . PNA vac Low Risk Adult  Completed   Immunization History  Administered Date(s) Administered  . Influenza Split 06/24/2013  . Influenza, High Dose Seasonal PF 05/23/2016, 06/28/2017, 06/12/2018  . Influenza, Seasonal, Injecte, Preservative Fre 05/27/2014, 07/11/2015  . Pneumococcal Conjugate-13 10/29/2016  . Pneumococcal Polysaccharide-23 07/25/2012, 11/06/2017  . Tdap 09/24/2010, 12/19/2015  . Zoster 09/25/2011  . Zoster Recombinat (Shingrix)  08/09/2017, 11/06/2017   We updated and reviewed the patient's past history in detail and it is documented below. Allergies: Patient is allergic to penicillins; sulfa antibiotics; sulfasalazine; tramadol; ciprofloxacin; trimethoprim; and isovue [iopamidol]. Past Medical History Patient  has a past medical history of Anxiety, Arthritis, Aspiration pneumonia ([place]) (2013), Atypical chest pain, Diarrhea, Diastolic dysfunction, DVT (deep venous thrombosis) ([place]), Dyslipidemia, GERD (gastroesophageal reflux disease), Hypertension, Osteoarthritis, Osteopenia, Palpitations, and Syncope. Past Surgical History Patient  has a past surgical history that includes Cholecystectomy (2006); Tubal ligation (06/1978); Replacement total knee (Left); and Knee arthroscopy (Right). Family History: Patient family history includes Arthritis in her father and mother; Heart disease in her mother; Mental illness in her father; Osteoporosis in her mother; Other in her mother; Stroke in her paternal grandfather; Sudden death in her father. Social History:  Patient  reports that she has never smoked. She has never used smokeless tobacco. She reports current alcohol use. She reports that she does not use drugs.  Review of Systems: Constitutional: negative for fever or malaise Ophthalmic: negative for photophobia, double vision or loss of vision Cardiovascular: negative for chest pain, dyspnea on exertion, or new LE swelling Respiratory: negative for SOB or persistent cough Gastrointestinal: negative for abdominal pain, change in bowel habits or melena Genitourinary: negative for dysuria or gross hematuria, no abnormal uterine bleeding or disharge Musculoskeletal: negative for new gait disturbance or muscular weakness Integumentary: negative for new or persistent rashes, no breast lumps Neurological: negative for TIA or stroke symptoms Psychiatric: negative for SI or delusions Allergic/Immunologic: negative for  hives  Patient Care Team    Relationship Specialty Notifications Start End  Leamon Arnt, MD PCP - General Family Medicine  03/19/16   Donzetta Sprung., MD Consulting Physician Orthopedic Surgery  12/04/17   Daryll Brod, MD Consulting Physician Orthopedic Surgery  12/04/17   Juanita Craver, MD Consulting Physician Gastroenterology  12/04/17   Inocencio Homes, DPM Consulting Physician Podiatry  10/22/18   [name], MD Consulting Physician Cardiology  10/22/18   Gregery Na, NP  Nurse Practitioner  10/22/18     Objective  Vitals: BP (!) 150/92   Pulse 74   Temp 98.5 F (36.9 C) (Oral)   Resp 16   Ht 5\' 3"  (1.6 m)   Wt 200  lb (90.7 kg)   SpO2 97%   BMI 35.43 kg/m  General:  Well developed, well nourished, no acute distress  Psych:  Alert and orientedx3,normal mood and affect HEENT:  Normocephalic, atraumatic, non-icteric sclera, PERRL, oropharynx is clear without mass or exudate, supple neck without adenopathy, mass or thyromegaly Cardiovascular:  Normal S1, S2, RRR without gallop, rub or murmur, nondisplaced PMI Respiratory:  Good breath sounds bilaterally, CTAB with normal respiratory effort Gastrointestinal: normal bowel sounds, soft, non-tender, no noted masses. No HSM MSK: no deformities, contusions. Joints are without erythema or swelling. Spine and CVA region are nontender Skin:  Warm, no rashes or suspicious lesions noted Neurologic:    Mental status is normal. CN 2-11 are normal. Gross motor and sensory exams are normal. Normal gait. No tremor Breast Exam: No mass, skin retraction or nipple discharge is appreciated in either breast. No axillary adenopathy. Fibrocystic changes are not noted    Commons side effects, risks, benefits, and alternatives for medications and treatment plan prescribed today were discussed, and the patient expressed understanding of the given instructions. Patient is instructed to call or message via MyChart if he/she has any questions or  concerns regarding our treatment plan. No barriers to understanding were identified. We discussed Red Flag symptoms and signs in detail. Patient expressed understanding regarding what to do in case of urgent or emergency type symptoms.   Medication list was reconciled, printed and provided to the patient in AVS. Patient instructions and summary information was reviewed with the patient as documented in the AVS. This note was prepared with assistance of Dragon voice recognition software. Occasional wrong-word or sound-a-like substitutions may have occurred due to the inherent limitations of voice recognition software

## 2018-10-22 NOTE — Addendum Note (Signed)
Addended by: Gerilyn Nestle on: 10/22/2018 09:44 AM   Modules accepted: Orders

## 2018-10-22 NOTE — Patient Instructions (Signed)
Please return in 6 months for follow up of your hypertension.  Please restart the losartan as we discussed. This should help bring your blood pressure back in the normal range.   If you have any questions or concerns, please don't hesitate to send me a message via MyChart or call the office at [phone]. Thank you for visiting with [place] today! It's our pleasure caring for you.    Hypertension Hypertension, commonly called high blood pressure, is when the force of blood pumping through the arteries is too strong. The arteries are the blood vessels that carry blood from the heart throughout the body. Hypertension forces the heart to work harder to pump blood and may cause arteries to become narrow or stiff. Having untreated or uncontrolled hypertension can cause heart attacks, strokes, kidney disease, and other problems. A blood pressure reading consists of a higher number over a lower number. Ideally, your blood pressure should be below 120/80. The first ("top") number is called the systolic pressure. It is a measure of the pressure in your arteries as your heart beats. The second ("bottom") number is called the diastolic pressure. It is a measure of the pressure in your arteries as the heart relaxes. What are the causes? The cause of this condition is not known. What increases the risk? Some risk factors for high blood pressure are under your control. Others are not. Factors you can change  Smoking.  Having type 2 diabetes mellitus, high cholesterol, or both.  Not getting enough exercise or physical activity.  Being overweight.  Having too much fat, sugar, calories, or salt (sodium) in your diet.  Drinking too much alcohol. Factors that are difficult or impossible to change  Having chronic kidney disease.  Having a family history of high blood pressure.  Age. Risk increases with age.  Race. You may be at higher risk if you are African-American.  Gender. Men are at higher risk  than women before age 39. After age 87, women are at higher risk than men.  Having obstructive sleep apnea.  Stress. What are the signs or symptoms? Extremely high blood pressure (hypertensive crisis) may cause:  Headache.  Anxiety.  Shortness of breath.  Nosebleed.  Nausea and vomiting.  Severe chest pain.  Jerky movements you cannot control (seizures). How is this diagnosed? This condition is diagnosed by measuring your blood pressure while you are seated, with your arm resting on a surface. The cuff of the blood pressure monitor will be placed directly against the skin of your upper arm at the level of your heart. It should be measured at least twice using the same arm. Certain conditions can cause a difference in blood pressure between your right and left arms. Certain factors can cause blood pressure readings to be lower or higher than normal (elevated) for a short period of time:  When your blood pressure is higher when you are in a health care provider's office than when you are at home, this is called white coat hypertension. Most people with this condition do not need medicines.  When your blood pressure is higher at home than when you are in a health care provider's office, this is called masked hypertension. Most people with this condition may need medicines to control blood pressure. If you have a high blood pressure reading during one visit or you have normal blood pressure with other risk factors:  You may be asked to return on a different day to have your blood pressure checked again.  You may be asked to monitor your blood pressure at home for 1 week or longer. If you are diagnosed with hypertension, you may have other blood or imaging tests to help your health care provider understand your overall risk for other conditions. How is this treated? This condition is treated by making healthy lifestyle changes, such as eating healthy foods, exercising more, and reducing  your alcohol intake. Your health care provider may prescribe medicine if lifestyle changes are not enough to get your blood pressure under control, and if:  Your systolic blood pressure is above 130.  Your diastolic blood pressure is above 80. Your personal target blood pressure may vary depending on your medical conditions, your age, and other factors. Follow these instructions at home: Eating and drinking   Eat a diet that is high in fiber and potassium, and low in sodium, added sugar, and fat. An example eating plan is called the DASH (Dietary Approaches to Stop Hypertension) diet. To eat this way: ? Eat plenty of fresh fruits and vegetables. Try to fill half of your plate at each meal with fruits and vegetables. ? Eat whole grains, such as whole wheat pasta, brown rice, or whole grain bread. Fill about one quarter of your plate with whole grains. ? Eat or drink low-fat dairy products, such as skim milk or low-fat yogurt. ? Avoid fatty cuts of meat, processed or cured meats, and poultry with skin. Fill about one quarter of your plate with lean proteins, such as fish, chicken without skin, beans, eggs, and tofu. ? Avoid premade and processed foods. These tend to be higher in sodium, added sugar, and fat.  Reduce your daily sodium intake. Most people with hypertension should eat less than 1,500 mg of sodium a day.  Limit alcohol intake to no more than 1 drink a day for nonpregnant women and 2 drinks a day for men. One drink equals 12 oz of beer, 5 oz of wine, or 1 oz of hard liquor. Lifestyle   Work with your health care provider to maintain a healthy body weight or to lose weight. Ask what an ideal weight is for you.  Get at least 30 minutes of exercise that causes your heart to beat faster (aerobic exercise) most days of the week. Activities may include walking, swimming, or biking.  Include exercise to strengthen your muscles (resistance exercise), such as pilates or lifting weights,  as part of your weekly exercise routine. Try to do these types of exercises for 30 minutes at least 3 days a week.  Do not use any products that contain nicotine or tobacco, such as cigarettes and e-cigarettes. If you need help quitting, ask your health care provider.  Monitor your blood pressure at home as told by your health care provider.  Keep all follow-up visits as told by your health care provider. This is important. Medicines  Take over-the-counter and prescription medicines only as told by your health care provider. Follow directions carefully. Blood pressure medicines must be taken as prescribed.  Do not skip doses of blood pressure medicine. Doing this puts you at risk for problems and can make the medicine less effective.  Ask your health care provider about side effects or reactions to medicines that you should watch for. Contact a health care provider if:  You think you are having a reaction to a medicine you are taking.  You have headaches that keep coming back (recurring).  You feel dizzy.  You have swelling in your ankles.  You have trouble with your vision. Get help right away if:  You develop a severe headache or confusion.  You have unusual weakness or numbness.  You feel faint.  You have severe pain in your chest or abdomen.  You vomit repeatedly.  You have trouble breathing. Summary  Hypertension is when the force of blood pumping through your arteries is too strong. If this condition is not controlled, it may put you at risk for serious complications.  Your personal target blood pressure may vary depending on your medical conditions, your age, and other factors. For most people, a normal blood pressure is less than 120/80.  Hypertension is treated with lifestyle changes, medicines, or a combination of both. Lifestyle changes include weight loss, eating a healthy, low-sodium diet, exercising more, and limiting alcohol. This information is not  intended to replace advice given to you by your health care provider. Make sure you discuss any questions you have with your health care provider. Document Released: 09/10/2005 Document Revised: 08/08/2016 Document Reviewed: 08/08/2016 Elsevier Interactive Patient Education  2019 [employer].

## 2018-10-22 NOTE — Patient Instructions (Addendum)
Schedule mammogram and bone scan after 11/23/2018.   Bring a copy of your living will and/or healthcare power of attorney to your next office visit.  Continue doing brain stimulating activities (puzzles, reading, adult coloring books, staying active) to keep memory sharp.   Health Maintenance, Female Adopting a healthy lifestyle and getting preventive care can go a long way to promote health and wellness. Talk with your health care provider about what schedule of regular examinations is right for you. This is a good chance for you to check in with your provider about disease prevention and staying healthy. In between checkups, there are plenty of things you can do on your own. Experts have done a lot of research about which lifestyle changes and preventive measures are most likely to keep you healthy. Ask your health care provider for more information. Weight and diet Eat a healthy diet  Be sure to include plenty of vegetables, fruits, low-fat dairy products, and lean protein.  Do not eat a lot of foods high in solid fats, added sugars, or salt.  Get regular exercise. This is one of the most important things you can do for your health. ? Most adults should exercise for at least 150 minutes each week. The exercise should increase your heart rate and make you sweat (moderate-intensity exercise). ? Most adults should also do strengthening exercises at least twice a week. This is in addition to the moderate-intensity exercise. Maintain a healthy weight  Body mass index (BMI) is a measurement that can be used to identify possible weight problems. It estimates body fat based on height and weight. Your health care provider can help determine your BMI and help you achieve or maintain a healthy weight.  For females 61 years of age and older: ? A BMI below 18.5 is considered underweight. ? A BMI of 18.5 to 24.9 is normal. ? A BMI of 25 to 29.9 is considered overweight. ? A BMI of 30 and above is  considered obese. Watch levels of cholesterol and blood lipids  You should start having your blood tested for lipids and cholesterol at 68 years of age, then have this test every 5 years.  You may need to have your cholesterol levels checked more often if: ? Your lipid or cholesterol levels are high. ? You are older than 68 years of age. ? You are at high risk for heart disease. Cancer screening Lung Cancer  Lung cancer screening is recommended for adults 51-27 years old who are at high risk for lung cancer because of a history of smoking.  A yearly low-dose CT scan of the lungs is recommended for people who: ? Currently smoke. ? Have quit within the past 15 years. ? Have at least a 30-pack-year history of smoking. A pack year is smoking an average of one pack of cigarettes a day for 1 year.  Yearly screening should continue until it has been 15 years since you quit.  Yearly screening should stop if you develop a health problem that would prevent you from having lung cancer treatment. Breast Cancer  Practice breast self-awareness. This means understanding how your breasts normally appear and feel.  It also means doing regular breast self-exams. Let your health care provider know about any changes, no matter how small.  If you are in your 20s or 30s, you should have a clinical breast exam (CBE) by a health care provider every 1-3 years as part of a regular health exam.  If you are 40 or  older, have a CBE every year. Also consider having a breast X-ray (mammogram) every year.  If you have a family history of breast cancer, talk to your health care provider about genetic screening.  If you are at high risk for breast cancer, talk to your health care provider about having an MRI and a mammogram every year.  Breast cancer gene (BRCA) assessment is recommended for women who have family members with BRCA-related cancers. BRCA-related cancers  include: ? Breast. ? Ovarian. ? Tubal. ? Peritoneal cancers.  Results of the assessment will determine the need for genetic counseling and BRCA1 and BRCA2 testing. Cervical Cancer Your health care provider may recommend that you be screened regularly for cancer of the pelvic organs (ovaries, uterus, and vagina). This screening involves a pelvic examination, including checking for microscopic changes to the surface of your cervix (Pap test). You may be encouraged to have this screening done every 3 years, beginning at age 40.  For women ages 52-65, health care providers may recommend pelvic exams and Pap testing every 3 years, or they may recommend the Pap and pelvic exam, combined with testing for human papilloma virus (HPV), every 5 years. Some types of HPV increase your risk of cervical cancer. Testing for HPV may also be done on women of any age with unclear Pap test results.  Other health care providers may not recommend any screening for nonpregnant women who are considered low risk for pelvic cancer and who do not have symptoms. Ask your health care provider if a screening pelvic exam is right for you.  If you have had past treatment for cervical cancer or a condition that could lead to cancer, you need Pap tests and screening for cancer for at least 20 years after your treatment. If Pap tests have been discontinued, your risk factors (such as having a new sexual partner) need to be reassessed to determine if screening should resume. Some women have medical problems that increase the chance of getting cervical cancer. In these cases, your health care provider may recommend more frequent screening and Pap tests. Colorectal Cancer  This type of cancer can be detected and often prevented.  Routine colorectal cancer screening usually begins at 68 years of age and continues through 68 years of age.  Your health care provider may recommend screening at an earlier age if you have risk factors for  colon cancer.  Your health care provider may also recommend using home test kits to check for hidden blood in the stool.  A small camera at the end of a tube can be used to examine your colon directly (sigmoidoscopy or colonoscopy). This is done to check for the earliest forms of colorectal cancer.  Routine screening usually begins at age 8.  Direct examination of the colon should be repeated every 5-10 years through 68 years of age. However, you may need to be screened more often if early forms of precancerous polyps or small growths are found. Skin Cancer  Check your skin from head to toe regularly.  Tell your health care provider about any new moles or changes in moles, especially if there is a change in a mole's shape or color.  Also tell your health care provider if you have a mole that is larger than the size of a pencil eraser.  Always use sunscreen. Apply sunscreen liberally and repeatedly throughout the day.  Protect yourself by wearing long sleeves, pants, a wide-brimmed hat, and sunglasses whenever you are outside. Heart disease, diabetes,  and high blood pressure  High blood pressure causes heart disease and increases the risk of stroke. High blood pressure is more likely to develop in: ? People who have blood pressure in the high end of the normal range (130-139/85-89 mm Hg). ? People who are overweight or obese. ? People who are African American.  If you are 18-39 years of age, have your blood pressure checked every 3-5 years. If you are 40 years of age or older, have your blood pressure checked every year. You should have your blood pressure measured twice-once when you are at a hospital or clinic, and once when you are not at a hospital or clinic. Record the average of the two measurements. To check your blood pressure when you are not at a hospital or clinic, you can use: ? An automated blood pressure machine at a pharmacy. ? A home blood pressure monitor.  If you are  between 55 years and 79 years old, ask your health care provider if you should take aspirin to prevent strokes.  Have regular diabetes screenings. This involves taking a blood sample to check your fasting blood sugar level. ? If you are at a normal weight and have a low risk for diabetes, have this test once every three years after 68 years of age. ? If you are overweight and have a high risk for diabetes, consider being tested at a younger age or more often. Preventing infection Hepatitis B  If you have a higher risk for hepatitis B, you should be screened for this virus. You are considered at high risk for hepatitis B if: ? You were born in a country where hepatitis B is common. Ask your health care provider which countries are considered high risk. ? Your parents were born in a high-risk country, and you have not been immunized against hepatitis B (hepatitis B vaccine). ? You have HIV or AIDS. ? You use needles to inject street drugs. ? You live with someone who has hepatitis B. ? You have had sex with someone who has hepatitis B. ? You get hemodialysis treatment. ? You take certain medicines for conditions, including cancer, organ transplantation, and autoimmune conditions. Hepatitis C  Blood testing is recommended for: ? Everyone born from 1945 through 1965. ? Anyone with known risk factors for hepatitis C. Sexually transmitted infections (STIs)  You should be screened for sexually transmitted infections (STIs) including gonorrhea and chlamydia if: ? You are sexually active and are younger than 68 years of age. ? You are older than 68 years of age and your health care provider tells you that you are at risk for this type of infection. ? Your sexual activity has changed since you were last screened and you are at an increased risk for chlamydia or gonorrhea. Ask your health care provider if you are at risk.  If you do not have HIV, but are at risk, it may be recommended that you take  a prescription medicine daily to prevent HIV infection. This is called pre-exposure prophylaxis (PrEP). You are considered at risk if: ? You are sexually active and do not regularly use condoms or know the HIV status of your partner(s). ? You take drugs by injection. ? You are sexually active with a partner who has HIV. Talk with your health care provider about whether you are at high risk of being infected with HIV. If you choose to begin PrEP, you should first be tested for HIV. You should then be tested every   3 months for as long as you are taking PrEP. Pregnancy  If you are premenopausal and you may become pregnant, ask your health care provider about preconception counseling.  If you may become pregnant, take 400 to 800 micrograms (mcg) of folic acid every day.  If you want to prevent pregnancy, talk to your health care provider about birth control (contraception). Osteoporosis and menopause  Osteoporosis is a disease in which the bones lose minerals and strength with aging. This can result in serious bone fractures. Your risk for osteoporosis can be identified using a bone density scan.  If you are 65 years of age or older, or if you are at risk for osteoporosis and fractures, ask your health care provider if you should be screened.  Ask your health care provider whether you should take a calcium or vitamin D supplement to lower your risk for osteoporosis.  Menopause may have certain physical symptoms and risks.  Hormone replacement therapy may reduce some of these symptoms and risks. Talk to your health care provider about whether hormone replacement therapy is right for you. Follow these instructions at home:  Schedule regular health, dental, and eye exams.  Stay current with your immunizations.  Do not use any tobacco products including cigarettes, chewing tobacco, or electronic cigarettes.  If you are pregnant, do not drink alcohol.  If you are breastfeeding, limit how  much and how often you drink alcohol.  Limit alcohol intake to no more than 1 drink per day for nonpregnant women. One drink equals 12 ounces of beer, 5 ounces of wine, or 1 ounces of hard liquor.  Do not use street drugs.  Do not share needles.  Ask your health care provider for help if you need support or information about quitting drugs.  Tell your health care provider if you often feel depressed.  Tell your health care provider if you have ever been abused or do not feel safe at home. This information is not intended to replace advice given to you by your health care provider. Make sure you discuss any questions you have with your health care provider. Document Released: 03/26/2011 Document Revised: 02/16/2016 Document Reviewed: 06/14/2015 Elsevier Interactive Patient Education  2019 [employer]  

## 2018-10-22 NOTE — Progress Notes (Signed)
I have reviewed the documentation from the recent AWV done by Kim Broome; I agree with the documentation and will follow up on any recommendations or abnormal findings as suggested.  

## 2018-10-23 ENCOUNTER — Encounter: Payer: Self-pay | Admitting: *Deleted

## 2018-10-23 NOTE — Progress Notes (Signed)
Please call patient: I have reviewed his/her lab results. All lab work looks good. No need to make any further medication adjustments or changes.

## 2018-10-29 ENCOUNTER — Telehealth: Payer: Self-pay

## 2018-10-29 NOTE — Telephone Encounter (Signed)
Copied from [place] [phone]. Topic: General - Other >> Oct 29, 2018  4:01 PM Virl Axe D wrote: Reason for CRM: Pt stated she never heard anything regarding her lab results from 10/22/18. Please advise pt. >> Oct 29, 2018  4:18 PM Leward Quan A wrote: Patient called to say that she is still waiting to get her lab results in the mail. Asking can it be mailed out again please    ** Attempted to contact patient, LMOVM. I have resent lab results to patient!!

## 2018-11-07 ENCOUNTER — Encounter: Payer: Self-pay | Admitting: Family Medicine

## 2018-11-07 ENCOUNTER — Other Ambulatory Visit: Payer: Self-pay | Admitting: *Deleted

## 2018-11-07 ENCOUNTER — Ambulatory Visit (INDEPENDENT_AMBULATORY_CARE_PROVIDER_SITE_OTHER): Payer: Medicare Other | Admitting: Family Medicine

## 2018-11-07 ENCOUNTER — Telehealth: Payer: Self-pay | Admitting: *Deleted

## 2018-11-07 ENCOUNTER — Other Ambulatory Visit: Payer: Self-pay

## 2018-11-07 VITALS — BP 134/80 | HR 80 | Temp 98.9°F | Resp 18 | Ht 63.0 in | Wt 199.2 lb

## 2018-11-07 DIAGNOSIS — J209 Acute bronchitis, unspecified: Secondary | ICD-10-CM | POA: Diagnosis not present

## 2018-11-07 MED ORDER — AZITHROMYCIN 250 MG PO TABS: ORAL_TABLET | ORAL | 0 refills | Status: DC

## 2018-11-07 MED ORDER — ALBUTEROL SULFATE (2.5 MG/3ML) 0.083% IN NEBU
2.5000 mg | INHALATION_SOLUTION | Freq: Once | RESPIRATORY_TRACT | Status: AC
  Administered 2018-11-07: 2.5 mg via RESPIRATORY_TRACT

## 2018-11-07 MED ORDER — ALBUTEROL SULFATE (2.5 MG/3ML) 0.083% IN NEBU: 2.5000 mg | INHALATION_SOLUTION | Freq: Four times a day (QID) | RESPIRATORY_TRACT | 0 refills | Status: DC | PRN

## 2018-11-07 MED ORDER — GUAIFENESIN-CODEINE 100-10 MG/5ML PO SOLN: 5.0000 mL | Freq: Four times a day (QID) | ORAL | 0 refills | Status: DC | PRN

## 2018-11-07 MED ORDER — ALBUTEROL SULFATE HFA 108 (90 BASE) MCG/ACT IN AERS: 2.0000 | INHALATION_SPRAY | RESPIRATORY_TRACT | 2 refills | Status: DC | PRN

## 2018-11-07 NOTE — Progress Notes (Signed)
Subjective  CC:  Chief Complaint  Patient presents with  . Cough    Started 2 days ago.. She has tried OTC medications    HPI: SUBJECTIVE:  Erin West is a 68 y.o. female who complains of congestion, nasal blockage, post nasal drip, cough described as harsh, paroxysmal, productive and and associated with tightness in the chest and denies sinus, high fevers, SOB, chest pain or significant GI symptoms. Symptoms have been present for 2 days. She denies a history of anorexia, dizziness, vomiting and wheezing. She denies a history of asthma or COPD but does have a chronic cough. Patient does not smoke cigarettes.  Assessment  1. Bronchitis with bronchospasm      Plan  Discussion:  Treat for bacterial bronchitis due to productive cough, her h/o of bronchitis and wheezing worsening symptoms. Education regarding differences between viral and bacterial infections and treatment options are discussed.  abx and albuterol. Pt declines steroids at this time. Supportive care measures are recommended.  We discussed the use of mucolytic's, decongestants, antihistamines and antitussives as needed.  Tylenol or Advil are recommended if needed.  Follow up: Return if symptoms worsen or fail to improve.   No orders of the defined types were placed in this encounter.  Meds ordered this encounter  Medications  . albuterol (PROVENTIL) (2.5 MG/3ML) 0.083% nebulizer solution 2.5 mg  . azithromycin (ZITHROMAX) 250 MG tablet    Sig: Take 2 tabs today, then 1 tab daily for 4 days    Dispense:  1 each    Refill:  0  . guaiFENesin-codeine 100-10 MG/5ML syrup    Sig: Take 5 mLs by mouth every 6 (six) hours as needed for cough.    Dispense:  120 mL    Refill:  0  . albuterol (PROVENTIL HFA;VENTOLIN HFA) 108 (90 Base) MCG/ACT inhaler    Sig: Inhale 2 puffs into the lungs every 4 (four) hours as needed for wheezing or shortness of breath.    Dispense:  1 Inhaler    Refill:  2      I reviewed the patients  updated PMH, FH, and SocHx.  Social History: Patient  reports that she has never smoked. She has never used smokeless tobacco. She reports current alcohol use. She reports that she does not use drugs.  Patient Active Problem List   Diagnosis Date Noted  . Severe obesity (BMI 35.0-39.9) with comorbidity ([place]) 08/04/2018    Priority: High  . Mixed hyperlipidemia 12/04/2017    Priority: High  . Chronic cough 07/24/2017    Priority: High  . Essential hypertension     Priority: High  . Chronic seasonal allergic rhinitis 12/04/2017    Priority: Medium  . Chronic pansinusitis 09/09/2017    Priority: Medium  . Laryngopharyngeal reflux (LPR) 09/09/2017    Priority: Medium  . Bilateral leg edema 03/30/2014    Priority: Medium  . History of gastritis 02/10/2014    Priority: Medium  . Dysphagia 01/28/2014    Priority: Medium  . Irritable bowel syndrome (IBS) 09/08/2013    Priority: Medium  . Osteopenia 07/14/2013    Priority: Medium  . Osteoarthritis, multiple sites 07/14/2013    Priority: Medium  . Gastroesophageal reflux disease without esophagitis     Priority: Medium  . Atrophic vaginitis 10/22/2018    Priority: Low  . Status post total right knee replacement 01/24/2016    Priority: Low    Review of Systems: Cardiovascular: negative for chest pain Respiratory: negative for SOB or  hemoptysis Gastrointestinal: negative for abdominal pain Genitourinary: negative for dysuria or gross hematuria Current Meds  Medication Sig  . atorvastatin (LIPITOR) 20 MG tablet Take 1 tablet (20 mg total) by mouth daily.  Marland Kitchen azelastine (ASTELIN) 0.1 % nasal spray Place 2 sprays into both nostrils 2 (two) times daily. Use in each nostril as directed  . carvedilol (COREG) 25 MG tablet Take 1 tablet (25 mg total) by mouth 2 (two) times daily.  . cetirizine (ZYRTEC ALLERGY) 10 MG tablet Take 1 tablet (10 mg total) by mouth daily.  . Cholecalciferol (VITAMIN D) 2000 UNITS CAPS Take 2,000 Units by mouth  daily.   . clindamycin (CLEOCIN) 300 MG capsule TK 2 CS PO 30 TO 45 MINUTES PRIOR TO CLEANING/PROCEDURE  . ESTRACE VAGINAL 0.1 MG/GM vaginal cream   . fluticasone (FLONASE) 50 MCG/ACT nasal spray USE 1 SPRAY NASALLY DAILY  . levocetirizine (XYZAL) 5 MG tablet Take 1 tablet (5 mg total) by mouth every evening.  . linaclotide (LINZESS) 145 MCG CAPS capsule   . losartan (COZAAR) 50 MG tablet Take 0.5 tablets (25 mg total) by mouth daily for 14 days, THEN 1 tablet (50 mg total) daily.  . Multiple Vitamin (MULTIVITAMIN) tablet Take 1 tablet by mouth daily.  Marland Kitchen omeprazole (PRILOSEC) 20 MG capsule Take by mouth.  . polyethylene glycol powder (GLYCOLAX/MIRALAX) powder Take 17 g by mouth daily as needed.  . Probiotic Product (PROBIOTIC PO) Take by mouth.    Objective  Vitals: BP 134/80   Pulse 80   Temp 98.9 F (37.2 C) (Oral)   Resp 18   Ht 5\' 3"  (1.6 m)   Wt 199 lb 3.2 oz (90.4 kg)   SpO2 97%   BMI 35.29 kg/m  General: no acute distress no respiratory distress but harsh cough present lessened after albuterol neb Psych:  Alert and oriented, normal mood and affect HEENT:  Normocephalic, atraumatic, supple neck, moist mucous membranes, mildly erythematous pharynx without exudate, mild lymphadenopathy, supple neck Cardiovascular:  RRR without murmur. no edema Respiratory:  Good breath sounds bilaterally, CTAB with normal respiratory effort with occasional rhonchi and few expiratory wheeze Skin:  Warm, no rashes Neurologic:   Mental status is normal. normal gait  Albuterol given in office with subjective improvement in sxs  Commons side effects, risks, benefits, and alternatives for medications and treatment plan prescribed today were discussed, and the patient expressed understanding of the given instructions. Patient is instructed to call or message via MyChart if he/she has any questions or concerns regarding our treatment plan. No barriers to understanding were identified. We discussed [place] symptoms and signs in detail. Patient expressed understanding regarding what to do in case of urgent or emergency type symptoms.  Medication list was reconciled, printed and provided to the patient in AVS. Patient instructions and summary information was reviewed with the patient as documented in the AVS. This note was prepared with assistance of Dragon voice recognition software. Occasional wrong-word or sound-a-like substitutions may have occurred due to the inherent limitations of voice recognition software

## 2018-11-07 NOTE — Patient Instructions (Addendum)
Please follow up if symptoms do not improve or as needed.   I've ordered and antibiotic, a strong cough syrup for nighttime use, and an inhaler. Please the instructions below.   Please follow up as scheduled for your next visit with me: 04/22/2019   If you have any questions or concerns, please don't hesitate to send me a message via MyChart or call the office at [phone]. Thank you for visiting with [place] today! It's our pleasure caring for you.    Acute Bronchitis, Adult  Acute bronchitis is sudden (acute) swelling of the air tubes (bronchi) in the lungs. Acute bronchitis causes these tubes to fill with mucus, which can make it hard to breathe. It can also cause coughing or wheezing. In adults, acute bronchitis usually goes away within 2 weeks. A cough caused by bronchitis may last up to 3 weeks. Smoking, allergies, and asthma can make the condition worse. Repeated episodes of bronchitis may cause further lung problems, such as chronic obstructive pulmonary disease (COPD). What are the causes? This condition can be caused by germs and by substances that irritate the lungs, including:  Cold and flu viruses. This condition is most often caused by the same virus that causes a cold.  Bacteria.  Exposure to tobacco smoke, dust, fumes, and air pollution. What increases the risk? This condition is more likely to develop in people who:  Have close contact with someone with acute bronchitis.  Are exposed to lung irritants, such as tobacco smoke, dust, fumes, and vapors.  Have a weak immune system.  Have a respiratory condition such as asthma. What are the signs or symptoms? Symptoms of this condition include:  A cough.  Coughing up clear, yellow, or green mucus.  Wheezing.  Chest congestion.  Shortness of breath.  A fever.  Body aches.  Chills.  A sore throat. How is this diagnosed? This condition is usually diagnosed with a physical exam. During the exam, your health  care provider may order tests, such as chest X-rays, to rule out other conditions. He or she may also:  Test a sample of your mucus for bacterial infection.  Check the level of oxygen in your blood. This is done to check for pneumonia.  Do a chest X-ray or lung function testing to rule out pneumonia and other conditions.  Perform blood tests. Your health care provider will also ask about your symptoms and medical history. How is this treated? Most cases of acute bronchitis clear up over time without treatment. Your health care provider may recommend:  Drinking more fluids. Drinking more makes your mucus thinner, which may make it easier to breathe.  Taking a medicine for a fever or cough.  Taking an antibiotic medicine.  Using an inhaler to help improve shortness of breath and to control a cough.  Using a cool mist vaporizer or humidifier to make it easier to breathe. Follow these instructions at home: Medicines  Take over-the-counter and prescription medicines only as told by your health care provider.  If you were prescribed an antibiotic, take it as told by your health care provider. Do not stop taking the antibiotic even if you start to feel better. General instructions   Get plenty of rest.  Drink enough fluids to keep your urine pale yellow.  Avoid smoking and secondhand smoke. Exposure to cigarette smoke or irritating chemicals will make bronchitis worse. If you smoke and you need help quitting, ask your health care provider. Quitting smoking will help your lungs heal faster.  Use an inhaler, cool mist vaporizer, or humidifier as told by your health care provider.  Keep all follow-up visits as told by your health care provider. This is important. How is this prevented? To lower your risk of getting this condition again:  Wash your hands often with soap and water. If soap and water are not available, use hand sanitizer.  Avoid contact with people who have cold  symptoms.  Try not to touch your hands to your mouth, nose, or eyes.  Make sure to get the flu shot every year. Contact a health care provider if:  Your symptoms do not improve in 2 weeks of treatment. Get help right away if:  You cough up blood.   You have chest pain.  You have severe shortness of breath.  You become dehydrated.  You faint or keep feeling like you are going to faint.  You keep vomiting.  You have a severe headache.  Your fever or chills gets worse. This information is not intended to replace advice given to you by your health care provider. Make sure you discuss any questions you have with your health care provider. Document Released: 10/18/2004 Document Revised: 04/24/2017 Document Reviewed: 02/29/2016 Elsevier Interactive Patient Education  2019 [employer].   How to Use a Metered Dose Inhaler A metered dose inhaler is a handheld device for taking medicine that must be breathed into the lungs (inhaled). The device can be used to deliver a variety of inhaled medicines, including:  Quick relief or rescue medicines, such as bronchodilators.  Controller medicines, such as corticosteroids. The medicine is delivered by pushing down on a metal canister to release a preset amount of spray and medicine. Each device contains the amount of medicine that is needed for a preset number of uses (inhalations). Your health care provider may recommend that you use a spacer with your inhaler to help you take the medicine more effectively. A spacer is a plastic tube with a mouthpiece on one end and an opening that connects to the inhaler on the other end. A spacer holds the medicine in a tube for a short time, which allows you to inhale more medicine. What are the risks? If you do not use your inhaler correctly, medicine might not reach your lungs to help you breathe. Inhaler medicine can cause side effects, such as:  Mouth or throat  infection.  Cough.  Hoarseness.  Headache.  Nausea and vomiting.  Lung infection (pneumonia) in people who have a lung condition called COPD. How to use a metered dose inhaler without a spacer  1. Remove the cap from the inhaler. 2. If you are using the inhaler for the first time, shake it for 5 seconds, turn it away from your face, then release 4 puffs into the air. This is called priming. 3. Shake the inhaler for 5 seconds. 4. Position the inhaler so the top of the canister faces up. 5. Put your index finger on the top of the medicine canister. Support the bottom of the inhaler with your thumb. 6. Breathe out normally and as completely as possible, away from the inhaler. 7. Either place the inhaler between your teeth and close your lips tightly around the mouthpiece, or hold the inhaler 1-2 inches (2.5-5 cm) away from your open mouth. Keep your tongue down out of the way. If you are unsure which technique to use, ask your health care provider. 8. Press the canister down with your index finger to release the medicine, then inhale  deeply and slowly through your mouth (not your nose) until your lungs are completely filled. Inhaling should take 4-6 seconds. 9. Hold the medicine in your lungs for 5-10 seconds (10 seconds is best). This helps the medicine get into the small airways of your lungs. 10. With your lips in a tight circle (pursed), breathe out slowly. 11. Repeat steps 3-10 until you have taken the number of puffs that your health care provider directed. Wait about 1 minute between puffs or as directed. 12. Put the cap on the inhaler. 13. If you are using a steroid inhaler, rinse your mouth with water, gargle, and spit out the water. Do not swallow the water. How to use a metered dose inhaler with a spacer  1. Remove the cap from the inhaler. 2. If you are using the inhaler for the first time, shake it for 5 seconds, turn it away from your face, then release 4 puffs into the air.  This is called priming. 3. Shake the inhaler for 5 seconds. 4. Place the open end of the spacer onto the inhaler mouthpiece. 5. Position the inhaler so the top of the canister faces up and the spacer mouthpiece faces you. 6. Put your index finger on the top of the medicine canister. Support the bottom of the inhaler and the spacer with your thumb. 7. Breathe out normally and as completely as possible, away from the spacer. 8. Place the spacer between your teeth and close your lips tightly around it. Keep your tongue down out of the way. 9. Press the canister down with your index finger to release the medicine, then inhale deeply and slowly through your mouth (not your nose) until your lungs are completely filled. Inhaling should take 4-6 seconds. 10. Hold the medicine in your lungs for 5-10 seconds (10 seconds is best). This helps the medicine get into the small airways of your lungs. 11. With your lips in a tight circle (pursed), breathe out slowly. 12. Repeat steps 3-11 until you have taken the number of puffs that your health care provider directed. Wait about 1 minute between puffs or as directed. 13. Remove the spacer from the inhaler and put the cap on the inhaler. 14. If you are using a steroid inhaler, rinse your mouth with water, gargle, and spit out the water. Do not swallow the water. Follow these instructions at home:  Take your inhaled medicine only as told by your health care provider. Do not use the inhaler more than directed by your health care provider.  Keep all follow-up visits as told by your health care provider. This is important.  If your inhaler has a counter, you can check it to determine how full your inhaler is. If your inhaler does not have a counter, ask your health care provider when you will need to refill your inhaler and write the refill date on a calendar or on your inhaler canister. Note that you cannot know when an inhaler is empty by shaking it.  Follow  directions on the package insert for care and cleaning of your inhaler and spacer. Contact a health care provider if:  Symptoms are only partially relieved with your inhaler.  You are having trouble using your inhaler.  You have an increase in phlegm.  You have headaches. Get help right away if:  You feel little or no relief after using your inhaler.  You have dizziness.  You have a fast heart rate.  You have chills or a fever.  You  have night sweats.  There is blood in your phlegm. Summary  A metered dose inhaler is a handheld device for taking medicine that must be breathed into the lungs (inhaled).  The medicine is delivered by pushing down on a metal canister to release a preset amount of spray and medicine.  Each device contains the amount of medicine that is needed for a preset number of uses (inhalations). This information is not intended to replace advice given to you by your health care provider. Make sure you discuss any questions you have with your health care provider. Document Released: 09/10/2005 Document Revised: 04/01/2017 Document Reviewed: 07/31/2016 Elsevier Interactive Patient Education  2019 [employer].

## 2018-11-07 NOTE — Telephone Encounter (Signed)
[facility] for #15 vials of Albuterol   Copied from [place] [phone]. Topic: General - Other >> Nov 07, 2018  2:01 PM Mcneil, Ja-Kwan wrote: Reason for CRM: Pt stated she still has the breathing machine and the hose she just needs a Rx for the vials. Pt requests Rx to be sent to [facility], [place]

## 2018-11-13 ENCOUNTER — Ambulatory Visit: Payer: Self-pay

## 2018-11-13 MED ORDER — PREDNISONE 10 MG PO TABS: ORAL_TABLET | ORAL | 0 refills | Status: DC

## 2018-11-13 NOTE — Telephone Encounter (Signed)
LMOVM that Prednisone is being sent

## 2018-11-13 NOTE — Addendum Note (Signed)
Addended by: Billey Chang on: 11/13/2018 04:07 PM   Modules accepted: Orders

## 2018-11-13 NOTE — Telephone Encounter (Signed)
Patient called and says that she saw Dr. Jonni Sanger on 11/07/18 for Bronchitis, was given antibiotics and cough medicine to take at night. She says she's finished taking the antibiotics, the cough medicine is not working. She says she still has the cough, wheezing. She says her chest feels tight, but no pain. She says she's not experiencing SOB, however sometimes she has trouble catching her breath. She says she would like to go ahead and get the prescription of Prednisone that Dr. Jonni Sanger wanted to give her. I asked her to hold while I call the office to see if she should come back in or send the request over. I called and spoke to [place], [facility] who advised to send this over to the office and she will get it to Dr. Jonni Sanger. I advised the patient of the above and that someone from the office will call with Dr. Tamela Oddi recommendation, patient verbalized understanding.  Reason for Disposition . Caller requesting a NON-URGENT new prescription or refill and triager unable to refill per unit policy  Protocols used: MEDICATION QUESTION CALL-A-AH

## 2018-11-13 NOTE — Telephone Encounter (Signed)
Agree. pred taper ordered. OV if not improving next week

## 2018-11-13 NOTE — Telephone Encounter (Signed)
Please advise 

## 2018-11-26 DIAGNOSIS — M8589 Other specified disorders of bone density and structure, multiple sites: Secondary | ICD-10-CM | POA: Diagnosis not present

## 2018-11-26 DIAGNOSIS — K589 Irritable bowel syndrome without diarrhea: Secondary | ICD-10-CM | POA: Diagnosis not present

## 2018-11-26 DIAGNOSIS — Z1231 Encounter for screening mammogram for malignant neoplasm of breast: Secondary | ICD-10-CM | POA: Diagnosis not present

## 2018-11-26 DIAGNOSIS — Z8262 Family history of osteoporosis: Secondary | ICD-10-CM | POA: Diagnosis not present

## 2018-11-26 DIAGNOSIS — Z96653 Presence of artificial knee joint, bilateral: Secondary | ICD-10-CM | POA: Diagnosis not present

## 2018-11-26 LAB — HM DEXA SCAN

## 2018-11-26 LAB — HM MAMMOGRAPHY

## 2018-12-01 ENCOUNTER — Other Ambulatory Visit: Payer: Self-pay | Admitting: Family Medicine

## 2018-12-01 ENCOUNTER — Encounter: Payer: Self-pay | Admitting: *Deleted

## 2018-12-01 NOTE — Progress Notes (Signed)
Please call patient: I have reviewed his/her lab results.  Bone density results show stable osteopenia.  Continue calcium and vitamin D and weightbearing exercise.  We can recheck in 2 to 3 years.

## 2018-12-02 ENCOUNTER — Encounter: Payer: Self-pay | Admitting: *Deleted

## 2018-12-03 ENCOUNTER — Ambulatory Visit: Payer: Medicare Other | Admitting: Cardiology

## 2018-12-04 ENCOUNTER — Telehealth: Payer: Self-pay

## 2018-12-04 NOTE — Telephone Encounter (Signed)
Copied from [place] [id number]. Topic: General - Other >> Dec 04, 2018 11:00 AM Ivar Drape wrote: Reason for CRM:   Patient would like the results of her recent Mammo and Bone Density Test  Spoke with pt, gave verbal understanding of mammogram results per PCP.

## 2018-12-22 ENCOUNTER — Ambulatory Visit: Payer: Medicare Other | Admitting: Cardiology

## 2019-02-02 ENCOUNTER — Encounter: Payer: Self-pay | Admitting: Family Medicine

## 2019-02-02 ENCOUNTER — Ambulatory Visit (INDEPENDENT_AMBULATORY_CARE_PROVIDER_SITE_OTHER): Payer: Medicare Other | Admitting: Family Medicine

## 2019-02-02 ENCOUNTER — Other Ambulatory Visit: Payer: Self-pay

## 2019-02-02 VITALS — BP 138/77 | Wt 194.0 lb

## 2019-02-02 DIAGNOSIS — K219 Gastro-esophageal reflux disease without esophagitis: Secondary | ICD-10-CM | POA: Diagnosis not present

## 2019-02-02 DIAGNOSIS — I10 Essential (primary) hypertension: Secondary | ICD-10-CM | POA: Diagnosis not present

## 2019-02-02 MED ORDER — LOSARTAN POTASSIUM 50 MG PO TABS: 50.0000 mg | ORAL_TABLET | Freq: Every day | ORAL | 3 refills | Status: DC

## 2019-02-02 MED ORDER — OMEPRAZOLE 20 MG PO CPDR: 20.0000 mg | DELAYED_RELEASE_CAPSULE | Freq: Two times a day (BID) | ORAL | 3 refills | Status: DC

## 2019-02-02 MED ORDER — CARVEDILOL 25 MG PO TABS: 12.5000 mg | ORAL_TABLET | Freq: Two times a day (BID) | ORAL | 3 refills | Status: DC

## 2019-02-02 NOTE — Assessment & Plan Note (Signed)
Evaluated by Dr. Collene Mares with LPR and dyshpagia: on high dose PPI and doing well but can't miss dosing. Refilled meds.

## 2019-02-02 NOTE — Progress Notes (Signed)
Virtual Visit via Video Note  Subjective  CC:  Chief Complaint  Patient presents with  . Gastroesophageal Reflux    Needing refill on Omeprazole, medication working well  . Hypertension    Checks BP ocassionally at home    I connected with Erin West on 02/02/19 at 11:00 AM EDT by a video enabled telemedicine application and verified that I am speaking with the correct person using two identifiers. Location patient: Home Location provider: [facility] participating in the virtual visit: Erin West, Erin Arnt, MD Erin West, CMA  I discussed the limitations of evaluation and management by telemedicine and the availability of in person appointments. The patient expressed understanding and agreed to proceed. HPI: Erin West is a 68 y.o. female who was contacted today to address the problems listed above in the chief complaint/HTN f/u:   Hypertension f/u: Control is good . Pt reports she is doing well. taking medications as instructed, no medication side effects noted, no TIAs, no chest pain on exertion, no dyspnea on exertion, no swelling of ankles. Feels well.   She denies adverse effects from his BP medications. Compliance with medication is good. Reviewed labs from November.   GERD: needs PPI refill. Well controlled IF she takes her medicine. Has sxs if she misses a dose.    BP Readings from Last 3 Encounters:  02/02/19 138/77  11/07/18 134/80  10/22/18 (!) 150/92   Wt Readings from Last 3 Encounters:  02/02/19 194 lb (88 kg)  11/07/18 199 lb 3.2 oz (90.4 kg)  10/22/18 200 lb (90.7 kg)    Lab Results  Component Value Date   CHOL 157 10/22/2018   CHOL 124 10/16/2017   Lab Results  Component Value Date   HDL 51.80 10/22/2018   HDL 49 10/16/2017   Lab Results  Component Value Date   LDLCALC 90 10/22/2018   [place] 61 10/16/2017   Lab Results  Component Value Date   TRIG 73.0 10/22/2018   TRIG 69  10/16/2017   Lab Results  Component Value Date   CHOLHDL 3 10/22/2018   No results found for: LDLDIRECT Lab Results  Component Value Date   CREATININE 0.78 10/22/2018   BUN 24 (H) 10/22/2018   NA 140 10/22/2018   K 4.4 10/22/2018   CL 106 10/22/2018   CO2 27 10/22/2018    The 10-year ASCVD risk score Mikey Bussing DC Jr., et al., 2013) is: 10.9%   Values used to calculate the score:     Age: 70 years     Sex: Female     Is Non-Hispanic African American: No     Diabetic: No     Tobacco smoker: No     Systolic Blood Pressure: 001 mmHg     Is BP treated: Yes     HDL Cholesterol: 51.8 mg/dL     Total Cholesterol: 157 mg/dL  Assessment  1. Essential hypertension   2. Gastroesophageal reflux disease without esophagitis      Plan   See below for problem based assessment and plan documentation  I discussed the assessment and treatment plan with the patient. The patient was provided an opportunity to ask questions and all were answered. The patient agreed with the plan and demonstrated an understanding of the instructions.   The patient was advised to call back or seek an in-person evaluation if the symptoms worsen or if the condition fails to improve as anticipated.  Follow up: Return in about 8 months (around 10/05/2019) for complete physical, follow up Hypertension.  04/22/2019  Meds ordered this encounter  Medications  . omeprazole (PRILOSEC) 20 MG capsule    Sig: Take 1 capsule (20 mg total) by mouth 2 (two) times daily.    Dispense:  180 capsule    Refill:  3  . losartan (COZAAR) 50 MG tablet    Sig: Take 1 tablet (50 mg total) by mouth daily.    Dispense:  90 tablet    Refill:  3  . carvedilol (COREG) 25 MG tablet    Sig: Take 0.5 tablets (12.5 mg total) by mouth 2 (two) times daily.    Dispense:  180 tablet    Refill:  3      I reviewed the patients updated PMH, FH, and SocHx.    Patient Active Problem List   Diagnosis Date Noted  . Severe obesity (BMI 35.0-39.9)  with comorbidity ([place]) 08/04/2018    Priority: High  . Mixed hyperlipidemia 12/04/2017    Priority: High  . Chronic cough 07/24/2017    Priority: High  . Essential hypertension     Priority: High  . Chronic seasonal allergic rhinitis 12/04/2017    Priority: Medium  . Chronic pansinusitis 09/09/2017    Priority: Medium  . Laryngopharyngeal reflux (LPR) 09/09/2017    Priority: Medium  . Bilateral leg edema 03/30/2014    Priority: Medium  . History of gastritis 02/10/2014    Priority: Medium  . Dysphagia 01/28/2014    Priority: Medium  . Irritable bowel syndrome (IBS) 09/08/2013    Priority: Medium  . Osteopenia 07/14/2013    Priority: Medium  . Osteoarthritis, multiple sites 07/14/2013    Priority: Medium  . Gastroesophageal reflux disease without esophagitis     Priority: Medium  . Atrophic vaginitis 10/22/2018    Priority: Low  . Status post total right knee replacement 01/24/2016    Priority: Low   Current Meds  Medication Sig  . atorvastatin (LIPITOR) 20 MG tablet Take 1 tablet (20 mg total) by mouth daily.  Marland Kitchen azelastine (ASTELIN) 0.1 % nasal spray Place 2 sprays into both nostrils 2 (two) times daily. Use in each nostril as directed  . carvedilol (COREG) 25 MG tablet Take 0.5 tablets (12.5 mg total) by mouth 2 (two) times daily.  . cetirizine (ZYRTEC ALLERGY) 10 MG tablet Take 1 tablet (10 mg total) by mouth daily.  . Cholecalciferol (VITAMIN D) 2000 UNITS CAPS Take 2,000 Units by mouth daily.   Marland Kitchen ESTRACE VAGINAL 0.1 MG/GM vaginal cream   . linaclotide (LINZESS) 145 MCG CAPS capsule   . losartan (COZAAR) 50 MG tablet Take 1 tablet (50 mg total) by mouth daily.  . Multiple Vitamin (MULTIVITAMIN) tablet Take 1 tablet by mouth daily.  Marland Kitchen omeprazole (PRILOSEC) 20 MG capsule Take 1 capsule (20 mg total) by mouth 2 (two) times daily.  . polyethylene glycol powder (GLYCOLAX/MIRALAX) powder Take 17 g by mouth daily as needed.  . Probiotic Product (PROBIOTIC PO) Take by mouth.   . [DISCONTINUED] carvedilol (COREG) 25 MG tablet Take 1 tablet (25 mg total) by mouth 2 (two) times daily.  . [DISCONTINUED] fluticasone (FLONASE) 50 MCG/ACT nasal spray USE 1 SPRAY NASALLY DAILY  . [DISCONTINUED] losartan (COZAAR) 50 MG tablet Take 0.5 tablets (25 mg total) by mouth daily for 14 days, THEN 1 tablet (50 mg total) daily.  . [DISCONTINUED] omeprazole (PRILOSEC) 20 MG capsule Take by mouth.    Allergies: Patient is  allergic to penicillins; sulfa antibiotics; sulfasalazine; tramadol; ciprofloxacin; trimethoprim; and isovue [iopamidol]. Family History: Patient family history includes Arthritis in her father and mother; Heart disease in her mother; Mental illness in her father; Osteoporosis in her mother; Other in her mother; Stroke in her paternal grandfather; Sudden death in her father. Social History:  Patient  reports that she has never smoked. She has never used smokeless tobacco. She reports current alcohol use. She reports that she does not use drugs.  Review of Systems: Constitutional: Negative for fever malaise or anorexia Cardiovascular: negative for chest pain Respiratory: negative for SOB or persistent cough Gastrointestinal: negative for abdominal pain  OBJECTIVE Vitals: BP 138/77   Wt 194 lb (88 kg)   BMI 34.37 kg/m  General: no acute distress , A&Ox3 She appears well, in no apparent distress.  Alert and oriented times three, pleasant and cooperative. Vital signs are as documented in vital signs section.  Erin Arnt, MD

## 2019-02-02 NOTE — Patient Instructions (Signed)
Please return in January 2021 for your annual complete physical; please come fasting.   If you have any questions or concerns, please don't hesitate to send me a message via MyChart or call the office at [phone]. Thank you for visiting with [place] today! It's our pleasure caring for you.

## 2019-02-02 NOTE — Assessment & Plan Note (Signed)
This medical condition is well controlled. There are no signs of complications, medication side effects, or red flags. Patient is instructed to continue the current treatment plan without change in therapies or medications.  Continue low dose carvedilol and losartan. Will have recheck with cardiology prior to our next visit in January.

## 2019-03-18 ENCOUNTER — Ambulatory Visit: Payer: Medicare Other | Admitting: Cardiology

## 2019-04-22 ENCOUNTER — Other Ambulatory Visit: Payer: Self-pay

## 2019-04-22 ENCOUNTER — Encounter: Payer: Self-pay | Admitting: Family Medicine

## 2019-04-22 ENCOUNTER — Ambulatory Visit (INDEPENDENT_AMBULATORY_CARE_PROVIDER_SITE_OTHER): Payer: Medicare Other | Admitting: Family Medicine

## 2019-04-22 VITALS — BP 128/84 | HR 75 | Temp 97.7°F | Resp 16 | Ht 63.0 in | Wt 196.0 lb

## 2019-04-22 DIAGNOSIS — I10 Essential (primary) hypertension: Secondary | ICD-10-CM

## 2019-04-22 DIAGNOSIS — N644 Mastodynia: Secondary | ICD-10-CM | POA: Diagnosis not present

## 2019-04-22 DIAGNOSIS — D229 Melanocytic nevi, unspecified: Secondary | ICD-10-CM | POA: Diagnosis not present

## 2019-04-22 DIAGNOSIS — D1801 Hemangioma of skin and subcutaneous tissue: Secondary | ICD-10-CM | POA: Diagnosis not present

## 2019-04-22 DIAGNOSIS — Z87898 Personal history of other specified conditions: Secondary | ICD-10-CM | POA: Diagnosis not present

## 2019-04-22 DIAGNOSIS — L720 Epidermal cyst: Secondary | ICD-10-CM | POA: Diagnosis not present

## 2019-04-22 NOTE — Progress Notes (Signed)
Subjective  CC:  Chief Complaint  Patient presents with  . Hypertension  . Breast Pain    Right side, pain happens occassionally.. She has not felt any lumps/masses, pain does not radiate    HPI: Erin West is a 68 y.o. female who presents to the office today to address the problems listed above in the chief complaint.  Hypertension f/u: Control is good . Pt reports she is doing well. taking medications as instructed, no medication side effects noted, no TIAs, no chest pain on exertion, no dyspnea on exertion, no swelling of ankles. She denies adverse effects from his BP medications. Compliance with medication is good.   Complains of mild aching breast pain on the right beneath the areaola.  happens intermittently, approximately once or twice a month.  Ongoing for the last couple months.  No rash, sharp pain, nipple discharge or notable breast masses.  Most recent mammogram was in March and was normal. Assessment  1. Essential hypertension   2. Breast pain, right      Plan    Hypertension f/u: This medical condition is well controlled. There are no signs of complications, medication side effects, or red flags. Patient is instructed to continue the current treatment plan without change in therapies or medications.   Breast pain: Reassured.  Normal exam.  Mild and intermittent.  See after visit summary. Education regarding management of these chronic disease states was given. Management strategies discussed on successive visits include dietary and exercise recommendations, goals of achieving and maintaining IBW, and lifestyle modifications aiming for adequate sleep and minimizing stressors.   Follow up: January for complete physical  No orders of the defined types were placed in this encounter.  No orders of the defined types were placed in this encounter.     BP Readings from Last 3 Encounters:  04/22/19 128/84  02/02/19 138/77  11/07/18 134/80   Wt Readings from Last 3  Encounters:  04/22/19 196 lb (88.9 kg)  02/02/19 194 lb (88 kg)  11/07/18 199 lb 3.2 oz (90.4 kg)    Lab Results  Component Value Date   CHOL 157 10/22/2018   CHOL 124 10/16/2017   Lab Results  Component Value Date   HDL 51.80 10/22/2018   HDL 49 10/16/2017   Lab Results  Component Value Date   LDLCALC 90 10/22/2018   [place] 61 10/16/2017   Lab Results  Component Value Date   TRIG 73.0 10/22/2018   TRIG 69 10/16/2017   Lab Results  Component Value Date   CHOLHDL 3 10/22/2018   No results found for: LDLDIRECT Lab Results  Component Value Date   CREATININE 0.78 10/22/2018   BUN 24 (H) 10/22/2018   NA 140 10/22/2018   K 4.4 10/22/2018   CL 106 10/22/2018   CO2 27 10/22/2018    The 10-year ASCVD risk score Mikey Bussing DC Jr., et al., 2013) is: 9.5%   Values used to calculate the score:     Age: 24 years     Sex: Female     Is Non-Hispanic African American: No     Diabetic: No     Tobacco smoker: No     Systolic Blood Pressure: 876 mmHg     Is BP treated: Yes     HDL Cholesterol: 51.8 mg/dL     Total Cholesterol: 157 mg/dL  I reviewed the patients updated PMH, FH, and SocHx.    Patient Active Problem List   Diagnosis Date Noted  .  Severe obesity (BMI 35.0-39.9) with comorbidity ([place]) 08/04/2018    Priority: High  . Mixed hyperlipidemia 12/04/2017    Priority: High  . Chronic cough 07/24/2017    Priority: High  . Essential hypertension     Priority: High  . Chronic seasonal allergic rhinitis 12/04/2017    Priority: Medium  . Chronic pansinusitis 09/09/2017    Priority: Medium  . Laryngopharyngeal reflux (LPR) 09/09/2017    Priority: Medium  . Bilateral leg edema 03/30/2014    Priority: Medium  . History of gastritis 02/10/2014    Priority: Medium  . Dysphagia 01/28/2014    Priority: Medium  . Irritable bowel syndrome (IBS) 09/08/2013    Priority: Medium  . Osteopenia 07/14/2013    Priority: Medium  . Osteoarthritis, multiple sites 07/14/2013     Priority: Medium  . Gastroesophageal reflux disease without esophagitis     Priority: Medium  . Atrophic vaginitis 10/22/2018    Priority: Low  . Status post total right knee replacement 01/24/2016    Priority: Low    Allergies: Penicillins, Sulfa antibiotics, Sulfasalazine, Tramadol, Ciprofloxacin, Trimethoprim, and Isovue [iopamidol]  Social History: Patient  reports that she has never smoked. She has never used smokeless tobacco. She reports current alcohol use. She reports that she does not use drugs.  Current Meds  Medication Sig  . albuterol (PROVENTIL HFA;VENTOLIN HFA) 108 (90 Base) MCG/ACT inhaler Inhale 2 puffs into the lungs every 4 (four) hours as needed for wheezing or shortness of breath.  Marland Kitchen albuterol (PROVENTIL) (2.5 MG/3ML) 0.083% nebulizer solution USE 1 VIAL VIA NEBULIZER EVERY 6 HOURS AS NEEDED FOR WHEEZING OR SHORTNESS OF BREATH  . atorvastatin (LIPITOR) 20 MG tablet Take 1 tablet (20 mg total) by mouth daily.  Marland Kitchen azelastine (ASTELIN) 0.1 % nasal spray Place 2 sprays into both nostrils 2 (two) times daily. Use in each nostril as directed  . carvedilol (COREG) 25 MG tablet Take 0.5 tablets (12.5 mg total) by mouth 2 (two) times daily.  . cetirizine (ZYRTEC ALLERGY) 10 MG tablet Take 1 tablet (10 mg total) by mouth daily.  . Cholecalciferol (VITAMIN D) 2000 UNITS CAPS Take 2,000 Units by mouth daily.   . clindamycin (CLEOCIN) 300 MG capsule TK 2 CS PO 30 TO 45 MINUTES PRIOR TO CLEANING/PROCEDURE  . ESTRACE VAGINAL 0.1 MG/GM vaginal cream   . linaclotide (LINZESS) 145 MCG CAPS capsule   . losartan (COZAAR) 50 MG tablet Take 1 tablet (50 mg total) by mouth daily.  . Multiple Vitamin (MULTIVITAMIN) tablet Take 1 tablet by mouth daily.  Marland Kitchen omeprazole (PRILOSEC) 20 MG capsule Take 1 capsule (20 mg total) by mouth 2 (two) times daily.  . polyethylene glycol powder (GLYCOLAX/MIRALAX) powder Take 17 g by mouth daily as needed.  . Probiotic Product (PROBIOTIC PO) Take by mouth.     Review of Systems: Cardiovascular: negative for chest pain, palpitations, leg swelling, orthopnea Respiratory: negative for SOB, wheezing or persistent cough Gastrointestinal: negative for abdominal pain Genitourinary: negative for dysuria or gross hematuria  Objective  Vitals: BP 128/84   Pulse 75   Temp 97.7 F (36.5 C) (Oral)   Resp 16   Ht 5\' 3"  (1.6 m)   Wt 196 lb (88.9 kg)   SpO2 96%   BMI 34.72 kg/m  General: no acute distress  Psych:  Alert and oriented, normal mood and affect HEENT:  Normocephalic, atraumatic, supple neck  Cardiovascular:  RRR without murmur. no edema Respiratory:  Good breath sounds bilaterally, CTAB with normal respiratory effort  Skin:  Warm, no rashes Right breast exam: Normal without breast mass, tenderness, nipple discharge or lymphadenopathy Neurologic:   Mental status is normal  Commons side effects, risks, benefits, and alternatives for medications and treatment plan prescribed today were discussed, and the patient expressed understanding of the given instructions. Patient is instructed to call or message via MyChart if he/she has any questions or concerns regarding our treatment plan. No barriers to understanding were identified. We discussed Red Flag symptoms and signs in detail. Patient expressed understanding regarding what to do in case of urgent or emergency type symptoms.   Medication list was reconciled, printed and provided to the patient in AVS. Patient instructions and summary information was reviewed with the patient as documented in the AVS. This note was prepared with assistance of Dragon voice recognition software. Occasional wrong-word or sound-a-like substitutions may have occurred due to the inherent limitations of voice recognition software

## 2019-04-22 NOTE — Patient Instructions (Signed)
Please return in January 2021 for your annual complete physical; please come fasting.   If you have any questions or concerns, please don't hesitate to send me a message via MyChart or call the office at [phone]. Thank you for visiting with [place] today! It's our pleasure caring for you.    Breast Tenderness Breast tenderness is a common problem for women of all ages. Breast tenderness may cause mild discomfort to severe pain. The pain usually comes and goes in association with your menstrual cycle, but it can be constant. Breast tenderness has many possible causes, including hormone changes and some medicines. Your health care provider may order tests, such as a mammogram or an ultrasound, to check for any unusual findings. Having breast tenderness usually does not mean that you have breast cancer. Follow these instructions at home: Sometimes, reassurance that you do not have breast cancer is all that is needed. In general, follow these home care instructions: Managing pain and discomfort   If directed, apply ice to the area: ? Put ice in a plastic bag. ? Place a towel between your skin and the bag. ? Leave the ice on for 20 minutes, 2-3 times a day.  Make sure you are wearing a supportive bra, especially during exercise. You may also want to wear a supportive bra while sleeping if your breasts are very tender. Medicines  Take over-the-counter and prescription medicines only as told by your health care provider. If the cause of your pain is infection, you may be prescribed an antibiotic medicine.  If you were prescribed an antibiotic, take it as told by your health care provider. Do not stop taking the antibiotic even if you start to feel better. General instructions   Your health care provider may recommend that you reduce the amount of fat in your diet. You can do this by: ? Limiting fried foods. ? Cooking foods using methods, such as baking, boiling, grilling, and broiling.   Decrease the amount of caffeine in your diet. You can do this by drinking more water and choosing caffeine-free options.  Keep a log of the days and times when your breasts are most tender.  Ask your health care provider how to do breast exams at home. This will help you notice if you have an unusual growth or lump. Contact a health care provider if:  Any part of your breast is hard, red, and hot to the touch. This may be a sign of infection.  You are not breastfeeding and you have fluid, especially blood or pus, coming out of your nipples.  You have a fever.  You have a new or painful lump in your breast that remains after your menstrual period ends.  Your pain does not improve or it gets worse.  Your pain is interfering with your daily activities. This information is not intended to replace advice given to you by your health care provider. Make sure you discuss any questions you have with your health care provider. Document Released: 08/23/2008 Document Revised: 08/23/2017 Document Reviewed: 06/08/2016 Elsevier Patient Education  2020 [employer].

## 2019-05-17 DIAGNOSIS — Z23 Encounter for immunization: Secondary | ICD-10-CM | POA: Diagnosis not present

## 2019-05-28 NOTE — Progress Notes (Signed)
HPI: FU palpitations.  Nuclear study July 2011 showed normal LV function and normal perfusion.  Event monitor July 2011 showed normal sinus rhythm. Echocardiogram September 2019 showed normal LV function, mild diastolic dysfunction.  Monitor October 2019 showed sinus rhythm with PVCs.  Since last seen, she has occasional brief flutters particularly when drinking caffeinated beverages.  Otherwise denies dyspnea, chest pain or syncope.  Current Outpatient Medications  Medication Sig Dispense Refill  . albuterol (PROVENTIL HFA;VENTOLIN HFA) 108 (90 Base) MCG/ACT inhaler Inhale 2 puffs into the lungs every 4 (four) hours as needed for wheezing or shortness of breath. 1 Inhaler 2  . albuterol (PROVENTIL) (2.5 MG/3ML) 0.083% nebulizer solution USE 1 VIAL VIA NEBULIZER EVERY 6 HOURS AS NEEDED FOR WHEEZING OR SHORTNESS OF BREATH 75 mL 1  . atorvastatin (LIPITOR) 20 MG tablet Take 1 tablet (20 mg total) by mouth daily. 90 tablet 3  . azelastine (ASTELIN) 0.1 % nasal spray Place 2 sprays into both nostrils 2 (two) times daily. Use in each nostril as directed 30 mL 12  . carvedilol (COREG) 25 MG tablet Take 0.5 tablets (12.5 mg total) by mouth 2 (two) times daily. 180 tablet 3  . Cholecalciferol (VITAMIN D) 2000 UNITS CAPS Take 2,000 Units by mouth daily.     Marland Kitchen losartan (COZAAR) 50 MG tablet Take 1 tablet (50 mg total) by mouth daily. 90 tablet 3  . Multiple Vitamin (MULTIVITAMIN) tablet Take 1 tablet by mouth daily.    Marland Kitchen omeprazole (PRILOSEC) 20 MG capsule Take 1 capsule (20 mg total) by mouth 2 (two) times daily. 180 capsule 3  . Probiotic Product (PROBIOTIC PO) Take by mouth.    . cetirizine (ZYRTEC ALLERGY) 10 MG tablet Take 1 tablet (10 mg total) by mouth daily. (Patient not taking: Reported on 06/02/2019) 90 tablet 3  . clindamycin (CLEOCIN) 300 MG capsule TK 2 CS PO 30 TO 45 MINUTES PRIOR TO CLEANING/PROCEDURE    . ESTRACE VAGINAL 0.1 MG/GM vaginal cream     . linaclotide (LINZESS) 145 MCG CAPS  capsule     . polyethylene glycol powder (GLYCOLAX/MIRALAX) powder Take 17 g by mouth daily as needed. (Patient not taking: Reported on 06/02/2019) 3350 g 1   No current facility-administered medications for this visit.      Past Medical History:  Diagnosis Date  . Anxiety   . Arthritis   . Aspiration pneumonia ([place]) 2013  . Atypical chest pain    Stress test 04/21/10 - post-stress EF=89%. Normal scan.  . Diarrhea   . Diastolic dysfunction    But normal LV function on ECHO 2011 and low risk Myoview in 2011  . DVT (deep venous thrombosis) ([place])   . Dyslipidemia   . GERD (gastroesophageal reflux disease)   . Hypertension   . Osteoarthritis   . Osteopenia   . Palpitations    [employer] MCT Monitor 04/16/10-04/22/10   . Syncope    Pain-mediated syncope    Past Surgical History:  Procedure Laterality Date  . CHOLECYSTECTOMY  2006  . KNEE ARTHROSCOPY Right   . REPLACEMENT TOTAL KNEE Left    left  . TUBAL LIGATION  06/1978    Social History   Socioeconomic History  . Marital status: Married    Spouse name: Not on file  . Number of children: 3  . Years of education: Not on file  . Highest education level: Not on file  Occupational History  . Not on file  Social Needs  .  Financial resource strain: Not on file  . Food insecurity    Worry: Not on file    Inability: Not on file  . Transportation needs    Medical: Not on file    Non-medical: Not on file  Tobacco Use  . Smoking status: Never Smoker  . Smokeless tobacco: Never Used  Substance and Sexual Activity  . Alcohol use: Yes    Alcohol/week: 0.0 standard drinks    Comment: 2-3/wine per month  . Drug use: No  . Sexual activity: Never  Lifestyle  . Physical activity    Days per week: Not on file    Minutes per session: Not on file  . Stress: Not on file  Relationships  . Social Herbalist on phone: Not on file    Gets together: Not on file    Attends religious service: Not on file    Active member  of club or organization: Not on file    Attends meetings of clubs or organizations: Not on file    Relationship status: Not on file  . Intimate partner violence    Fear of current or ex partner: Not on file    Emotionally abused: Not on file    Physically abused: Not on file    Forced sexual activity: Not on file  Other Topics Concern  . Not on file  Social History Narrative  . Not on file    Family History  Problem Relation Age of Onset  . Heart disease Mother   . Other Mother        leg amputation  . Arthritis Mother   . Osteoporosis Mother   . Mental illness Father   . Sudden death Father   . Arthritis Father   . Stroke Paternal Grandfather     ROS: no fevers or chills, productive cough, hemoptysis, dysphasia, odynophagia, melena, hematochezia, dysuria, hematuria, rash, seizure activity, orthopnea, PND, pedal edema, claudication. Remaining systems are negative.  Physical Exam: Well-developed well-nourished in no acute distress.  Skin is warm and dry.  HEENT is normal.  Neck is supple.  Chest is clear to auscultation with normal expansion.  Cardiovascular exam is regular rate and rhythm.  Abdominal exam nontender or distended. No masses palpated. Extremities show no edema. neuro grossly intact  ECG-sinus rhythm at a rate of 64, no ST changes.  Personally reviewed  A/P  1 palpitations-symptoms are reasonably well controlled on carvedilol.  Will continue.  Note LV function normal as was previous TSH.  Previous monitor showed sinus with PVCs.  2 hypertension-blood pressure is controlled today.  Continue present medications and follow.  3 hyperlipidemia-continue statin.  Managed by primary care.  [name], MD

## 2019-06-02 ENCOUNTER — Ambulatory Visit (INDEPENDENT_AMBULATORY_CARE_PROVIDER_SITE_OTHER): Payer: Medicare Other | Admitting: Cardiology

## 2019-06-02 ENCOUNTER — Other Ambulatory Visit: Payer: Self-pay

## 2019-06-02 ENCOUNTER — Encounter: Payer: Self-pay | Admitting: Cardiology

## 2019-06-02 VITALS — BP 131/76 | HR 65 | Temp 97.6°F | Ht 63.0 in | Wt 197.6 lb

## 2019-06-02 DIAGNOSIS — I10 Essential (primary) hypertension: Secondary | ICD-10-CM

## 2019-06-02 DIAGNOSIS — E7800 Pure hypercholesterolemia, unspecified: Secondary | ICD-10-CM

## 2019-06-02 DIAGNOSIS — R002 Palpitations: Secondary | ICD-10-CM | POA: Diagnosis not present

## 2019-06-02 NOTE — Patient Instructions (Signed)

## 2019-06-05 NOTE — Addendum Note (Signed)
Addended by: Crissie Reese on: 06/05/2019 03:38 PM   Modules accepted: Orders

## 2019-06-09 ENCOUNTER — Telehealth: Payer: Self-pay

## 2019-06-09 ENCOUNTER — Ambulatory Visit (INDEPENDENT_AMBULATORY_CARE_PROVIDER_SITE_OTHER): Payer: Medicare Other | Admitting: Family Medicine

## 2019-06-09 ENCOUNTER — Other Ambulatory Visit: Payer: Self-pay

## 2019-06-09 ENCOUNTER — Encounter: Payer: Self-pay | Admitting: Family Medicine

## 2019-06-09 VITALS — BP 122/74 | HR 95 | Temp 97.6°F | Ht 63.0 in | Wt 195.0 lb

## 2019-06-09 DIAGNOSIS — R350 Frequency of micturition: Secondary | ICD-10-CM | POA: Diagnosis not present

## 2019-06-09 LAB — POC URINALSYSI DIPSTICK (AUTOMATED)
Bilirubin, UA: NEGATIVE
Blood, UA: NEGATIVE
Glucose, UA: NEGATIVE
Ketones, UA: NEGATIVE
Leukocytes, UA: NEGATIVE
Nitrite, UA: NEGATIVE
Protein, UA: NEGATIVE
Spec Grav, UA: 1.02 (ref 1.010–1.025)
Urobilinogen, UA: 0.2 E.U./dL
pH, UA: 6 (ref 5.0–8.0)

## 2019-06-09 MED ORDER — NITROFURANTOIN MONOHYD MACRO 100 MG PO CAPS: 100.0000 mg | ORAL_CAPSULE | Freq: Two times a day (BID) | ORAL | 0 refills | Status: DC

## 2019-06-09 NOTE — Patient Instructions (Signed)
It was nice to see you!  You have a urinary tract infection. Please start the antibiotic.  We will check a urine culture to make sure you do not have a resistant bacteria. We will call you if we need to change your medications.   Please make sure you are drinking plenty of fluids over the next few days.  If your symptoms do not improve over the next 5-7 days, or if they worsen, please let us know. Please also let us know if you have worsening back pain, fevers, chills, or body aches.   Take care, Dr Parker  

## 2019-06-09 NOTE — Progress Notes (Signed)
   Chief Complaint:  Erin West is a 68 y.o. female who presents for same day appointment with a chief complaint of urinary frequency.   Assessment/Plan:  Urinary frequency/dysuria UA negative however history consistent with UTI.  Will start Macrobid empirically.  Check urine culture.  Encouraged good oral hydration.  Discussed reasons to return to care.  Follow-up as needed     Subjective:  HPI:  Urinary Frequency, acute problem Started about a day ago. Symptoms are similar today.  Some dysuria. No blood in urine. No fevers or chills. Some worsening back pain. She has had some lower abddominal pain after urination. No other obvious alleviating or aggravating factors.    ROS: Per HPI  PMH: She reports that she has never smoked. She has never used smokeless tobacco. She reports current alcohol use. She reports that she does not use drugs.      Objective:  Physical Exam: BP 122/74   Pulse 95   Temp 97.6 F (36.4 C)   Ht 5\' 3"  (1.6 m)   Wt 195 lb (88.5 kg)   SpO2 96%   BMI 34.54 kg/m   Gen: NAD, resting comfortably CV: Regular rate and rhythm with no murmurs appreciated Pulm: Normal work of breathing, clear to auscultation bilaterally with no crackles, wheezes, or rhonchi MSK: No edema, cyanosis, or clubbing noted. No CVA tenderness.  Skin: Warm, dry Neuro: Grossly normal, moves all extremities Psych: Normal affect and thought content  Results for orders placed or performed in visit on 06/09/19 (from the past 24 hour(s))  POCT Urinalysis Dipstick (Automated)     Status: None   Collection Time: 06/09/19  2:42 PM  Result Value Ref Range   Color, UA Yellow    Clarity, UA Clear    Glucose, UA Negative Negative   Bilirubin, UA Negative    Ketones, UA Negative    Spec Grav, UA 1.020 1.010 - 1.025   Blood, UA Negative    pH, UA 6.0 5.0 - 8.0   Protein, UA Negative Negative   Urobilinogen, UA 0.2 0.2 or 1.0 E.U./dL   Nitrite, UA Negative    Leukocytes, UA Negative  Negative        Caleb M. Jerline Pain, MD 06/09/2019 3:32 PM

## 2019-06-09 NOTE — Telephone Encounter (Signed)
Copied from [place] [id number]. Topic: General - Other >> Jun 09, 2019 12:38 PM Keene Breath wrote: Reason for CRM: Patient would like to talk to the nurse regarding some symptoms she is having.  She believes she has a UTI.  Please call back at [phone]

## 2019-06-09 NOTE — Telephone Encounter (Signed)
Pt aware to head to office now and Dr. Jerline Pain will work her in

## 2019-06-09 NOTE — Telephone Encounter (Signed)
Spoke with pt she is aware [facility] are full and I am awaiting to see if I can get her seen at [facility]

## 2019-06-11 LAB — URINE CULTURE
MICRO NUMBER:: 883541
SPECIMEN QUALITY:: ADEQUATE

## 2019-06-11 NOTE — Progress Notes (Signed)
Please inform patient of the following:  Urine culture is inconclusive for UTI. Recommend that she complete her course of antibiotics and let us know if not improving.  Algis Greenhouse. Jerline Pain, MD 06/11/2019 8:15 AM

## 2019-06-16 ENCOUNTER — Telehealth: Payer: Self-pay | Admitting: Family Medicine

## 2019-06-16 ENCOUNTER — Other Ambulatory Visit: Payer: Self-pay | Admitting: Cardiology

## 2019-06-16 NOTE — Telephone Encounter (Signed)
Called pt to screen for covid symptoms/contact for appt on 06/17/19. Patient said that she is not feeling well, experiencing abdominal pain. Please call patient at [phone] to triage. Thank you!

## 2019-06-16 NOTE — Telephone Encounter (Signed)
F.Y.I   I spoke with patient and she informed me that she had a UTI last week and saw Dr. Jerline Pain.  Patient was prescribed a antibiotic and finished it this week.  Patient explains that she has a study, constant, dull pain now and is experiencing some constipation.  Patient said that her pain level is at a 5 right now, with no other symptoms  I informed the patient if her symptoms worsen to please head to ED.  She has an appointment to see Dr. Jonni Sanger tomorrow for this issue.

## 2019-06-17 ENCOUNTER — Ambulatory Visit (INDEPENDENT_AMBULATORY_CARE_PROVIDER_SITE_OTHER): Payer: Medicare Other | Admitting: Family Medicine

## 2019-06-17 ENCOUNTER — Encounter: Payer: Self-pay | Admitting: Family Medicine

## 2019-06-17 ENCOUNTER — Other Ambulatory Visit: Payer: Self-pay

## 2019-06-17 VITALS — BP 120/70 | HR 73 | Temp 98.3°F | Resp 16 | Ht 63.0 in | Wt 194.6 lb

## 2019-06-17 DIAGNOSIS — M546 Pain in thoracic spine: Secondary | ICD-10-CM | POA: Diagnosis not present

## 2019-06-17 DIAGNOSIS — K5909 Other constipation: Secondary | ICD-10-CM | POA: Diagnosis not present

## 2019-06-17 DIAGNOSIS — N3281 Overactive bladder: Secondary | ICD-10-CM

## 2019-06-17 DIAGNOSIS — E860 Dehydration: Secondary | ICD-10-CM | POA: Diagnosis not present

## 2019-06-17 DIAGNOSIS — R350 Frequency of micturition: Secondary | ICD-10-CM | POA: Diagnosis not present

## 2019-06-17 LAB — POCT URINALYSIS DIPSTICK
Bilirubin, UA: NEGATIVE
Blood, UA: NEGATIVE
Glucose, UA: NEGATIVE
Ketones, UA: NEGATIVE
Leukocytes, UA: NEGATIVE
Nitrite, UA: NEGATIVE
Protein, UA: NEGATIVE
Spec Grav, UA: 1.025 (ref 1.010–1.025)
Urobilinogen, UA: 0.2 E.U./dL
pH, UA: 6 (ref 5.0–8.0)

## 2019-06-17 MED ORDER — OXYBUTYNIN CHLORIDE ER 10 MG PO TB24: 10.0000 mg | ORAL_TABLET | Freq: Every day | ORAL | 11 refills | Status: DC

## 2019-06-17 NOTE — Progress Notes (Signed)
Subjective  CC:  Chief Complaint  Patient presents with  . UTI follow up    Reports urine frequency study, constant, dull pain now and is experiencing some constipation.   . Back Pain    Located below left shoulder blade, dull pain that comes and goes    HPI: Erin West is a 68 y.o. female who presents to the office today to address the problems listed above in the chief complaint.  She reports persistent symptoms of increased urinary urgency and pelvic pressure.  I reviewed notes from last visit.  Negative UA, negative urine culture treated with Macrobid.  No change in symptoms.  She reports her urine at times is dark.  She drinks tea daily, little water consumption.  She has been treated for overactive bladder symptoms in the past with Ditropan and did well.  She does have chronic constipation on Linzess but feels that that often gives her diarrhea.  She admits to current constipation including hard firm small stools.  No blood in the stool.  No fever, chill, flank pain, nausea or vomiting.  No history of recurrent urinary tract infections or pyelonephritis.  Right subscapular back pain intermittent and worse with certain movements.  No trauma.  She worries at this could be related to heart or kidneys.  Aleve helps the pain Assessment  1. OAB (overactive bladder)   2. Frequent urination   3. Chronic constipation   4. Dehydration   5. Acute right-sided thoracic back pain      Plan   Overactive bladder: Start Ditropan again, hydrate and decrease tea consumption.  Education given.  Chronic constipation: Hold Linzess and trial of MiraLAX and Colace.  Follow-up if not improving could be contributing to her worsening bladder symptoms  Mild intermittent thoracic back pain, musculoskeletal, heat, massage, NSAIDs as needed.  Follow up: As needed 10/28/2019  Orders Placed This Encounter  Procedures  . POCT urinalysis dipstick   Meds ordered this encounter  Medications  .  oxybutynin (DITROPAN XL) 10 MG 24 hr tablet    Sig: Take 1 tablet (10 mg total) by mouth at bedtime.    Dispense:  30 tablet    Refill:  11      I reviewed the patients updated PMH, FH, and SocHx.    Patient Active Problem List   Diagnosis Date Noted  . Severe obesity (BMI 35.0-39.9) with comorbidity ([place]) 08/04/2018    Priority: High  . Mixed hyperlipidemia 12/04/2017    Priority: High  . Chronic cough 07/24/2017    Priority: High  . Essential hypertension     Priority: High  . Chronic seasonal allergic rhinitis 12/04/2017    Priority: Medium  . Chronic pansinusitis 09/09/2017    Priority: Medium  . Laryngopharyngeal reflux (LPR) 09/09/2017    Priority: Medium  . Bilateral leg edema 03/30/2014    Priority: Medium  . History of gastritis 02/10/2014    Priority: Medium  . Dysphagia 01/28/2014    Priority: Medium  . Irritable bowel syndrome (IBS) 09/08/2013    Priority: Medium  . Osteopenia 07/14/2013    Priority: Medium  . Osteoarthritis, multiple sites 07/14/2013    Priority: Medium  . Gastroesophageal reflux disease without esophagitis     Priority: Medium  . Atrophic vaginitis 10/22/2018    Priority: Low  . Status post total right knee replacement 01/24/2016    Priority: Low   Current Meds  Medication Sig  . albuterol (PROVENTIL HFA;VENTOLIN HFA) 108 (90 Base) MCG/ACT inhaler  Inhale 2 puffs into the lungs every 4 (four) hours as needed for wheezing or shortness of breath.  Marland Kitchen albuterol (PROVENTIL) (2.5 MG/3ML) 0.083% nebulizer solution USE 1 VIAL VIA NEBULIZER EVERY 6 HOURS AS NEEDED FOR WHEEZING OR SHORTNESS OF BREATH  . atorvastatin (LIPITOR) 20 MG tablet Take 1 tablet (20 mg total) by mouth daily.  Marland Kitchen azelastine (ASTELIN) 0.1 % nasal spray Place 2 sprays into both nostrils 2 (two) times daily. Use in each nostril as directed  . carvedilol (COREG) 25 MG tablet Take 0.5 tablets (12.5 mg total) by mouth 2 (two) times daily.  . cetirizine (ZYRTEC ALLERGY) 10 MG  tablet Take 1 tablet (10 mg total) by mouth daily.  . Cholecalciferol (VITAMIN D) 2000 UNITS CAPS Take 2,000 Units by mouth daily.   . clindamycin (CLEOCIN) 300 MG capsule TK 2 CS PO 30 TO 45 MINUTES PRIOR TO CLEANING/PROCEDURE  . ESTRACE VAGINAL 0.1 MG/GM vaginal cream   . linaclotide (LINZESS) 145 MCG CAPS capsule   . losartan (COZAAR) 50 MG tablet Take 1 tablet (50 mg total) by mouth daily.  . Multiple Vitamin (MULTIVITAMIN) tablet Take 1 tablet by mouth daily.  Marland Kitchen omeprazole (PRILOSEC) 20 MG capsule Take 1 capsule (20 mg total) by mouth 2 (two) times daily.  . polyethylene glycol powder (GLYCOLAX/MIRALAX) powder Take 17 g by mouth daily as needed.  . Probiotic Product (PROBIOTIC PO) Take by mouth.    Allergies: Patient is allergic to penicillins; sulfa antibiotics; sulfasalazine; tramadol; ciprofloxacin; trimethoprim; and isovue [iopamidol]. Family History: Patient family history includes Arthritis in her father and mother; Heart disease in her mother; Mental illness in her father; Osteoporosis in her mother; Other in her mother; Stroke in her paternal grandfather; Sudden death in her father. Social History:  Patient  reports that she has never smoked. She has never used smokeless tobacco. She reports current alcohol use. She reports that she does not use drugs.  Review of Systems: Constitutional: Negative for fever malaise or anorexia Cardiovascular: negative for chest pain Respiratory: negative for SOB or persistent cough Gastrointestinal: negative for abdominal pain  Objective  Vitals: BP 120/70   Pulse 73   Temp 98.3 F (36.8 C) (Tympanic)   Resp 16   Ht 5\' 3"  (1.6 m)   Wt 194 lb 9.6 oz (88.3 kg)   SpO2 98%   BMI 34.47 kg/m  General: no acute distress , A&Ox3 HEENT: PEERL, conjunctiva normal, Oropharynx moist,neck is supple Cardiovascular:  RRR without murmur or gallop.  Respiratory:  Good breath sounds bilaterally, CTAB with normal respiratory effort Gastrointestinal:  soft, flat abdomen, normal active bowel sounds, no palpable masses, no hepatosplenomegaly, no appreciated hernias No cvat Back: right subscapular mm ttp Skin:  Warm, no rashes  Office Visit on 06/17/2019  Component Date Value Ref Range Status  . Color, UA 06/17/2019 Yellow   Final  . Clarity, UA 06/17/2019 Cloudy   Final  . Glucose, UA 06/17/2019 Negative  Negative Final  . Bilirubin, UA 06/17/2019 Negative   Final  . Ketones, UA 06/17/2019 Negative   Final  . Spec Grav, UA 06/17/2019 1.025  1.010 - 1.025 Final  . Blood, UA 06/17/2019 Negative   Final  . pH, UA 06/17/2019 6.0  5.0 - 8.0 Final  . Protein, UA 06/17/2019 Negative  Negative Final  . Urobilinogen, UA 06/17/2019 0.2  0.2 or 1.0 E.U./dL Final  . Nitrite, UA 06/17/2019 Negative   Final  . Leukocytes, UA 06/17/2019 Negative  Negative Final  Commons side effects, risks, benefits, and alternatives for medications and treatment plan prescribed today were discussed, and the patient expressed understanding of the given instructions. Patient is instructed to call or message via MyChart if he/she has any questions or concerns regarding our treatment plan. No barriers to understanding were identified. We discussed Red Flag symptoms and signs in detail. Patient expressed understanding regarding what to do in case of urgent or emergency type symptoms.   Medication list was reconciled, printed and provided to the patient in AVS. Patient instructions and summary information was reviewed with the patient as documented in the AVS. This note was prepared with assistance of Dragon voice recognition software. Occasional wrong-word or sound-a-like substitutions may have occurred due to the inherent limitations of voice recognition software

## 2019-06-17 NOTE — Patient Instructions (Signed)
Please follow up as scheduled for your next visit with me: 10/28/2019   Increase your water intake to help your bladder symptoms. When you are dehydrated, the concentrated urine can be more irritating causing pressure, pain and sensation to need to go. Start the bladder medications again.   Start colace twice a day and miralax daily for your constipation.   If you have any questions or concerns, please don't hesitate to send me a message via MyChart or call the office at [phone]. Thank you for visiting with [place] today! It's our pleasure caring for you.

## 2019-07-14 ENCOUNTER — Telehealth: Payer: Self-pay

## 2019-07-14 NOTE — Telephone Encounter (Signed)
Called pt and got scheduled 10/23, please advise if OV is needed or not

## 2019-07-14 NOTE — Telephone Encounter (Signed)
Copied from [place] [id number]. Topic: General - Other >> Jul 14, 2019  3:50 PM Leward Quan A wrote: Reason for CRM: Patient called to inform Dr Jonni Sanger that she is still having pain in her pelvic area even though she is almost finished with the medication she was given at the last visit. She is asking can she get something stronger to help without coming back to see her for a visit. Ph# [phone]

## 2019-07-15 NOTE — Telephone Encounter (Signed)
Pt does not think it is GI related, reports more of a cycle pain/cramping

## 2019-07-15 NOTE — Telephone Encounter (Signed)
Please call patient: if her symptoms are not improving, needs further evaluation. Can't give 'stronger' medication since we don't have the exact cause of her pain. If she feels it is due to constipation, then can see GI again. If not, would at least need a virtual visit with me.

## 2019-07-17 ENCOUNTER — Ambulatory Visit (INDEPENDENT_AMBULATORY_CARE_PROVIDER_SITE_OTHER): Payer: Medicare Other | Admitting: Family Medicine

## 2019-07-17 ENCOUNTER — Other Ambulatory Visit: Payer: Self-pay

## 2019-07-17 ENCOUNTER — Telehealth: Payer: Self-pay | Admitting: Family Medicine

## 2019-07-17 ENCOUNTER — Encounter: Payer: Self-pay | Admitting: Family Medicine

## 2019-07-17 VITALS — BP 122/84 | HR 70 | Temp 98.1°F | Resp 16 | Ht 63.0 in | Wt 193.0 lb

## 2019-07-17 DIAGNOSIS — N3281 Overactive bladder: Secondary | ICD-10-CM

## 2019-07-17 MED ORDER — MIRABEGRON ER 25 MG PO TB24: 25.0000 mg | ORAL_TABLET | Freq: Every day | ORAL | 2 refills | Status: DC

## 2019-07-17 NOTE — Progress Notes (Signed)
Subjective  CC:  Chief Complaint  Patient presents with  . Lower abdomen pain    On going for a while, she reports that the urgency has not improved and feeling like she is not emptying her bladder.. Denies any improvement with starting Oxybutynin    HPI: Erin West is a 68 y.o. female who presents to the office today to address the problems listed above in the chief complaint.  See last several notes. C/o urinary frequency and poor bladder emptying. Neg for urine infection. Denies constipation now or abdominal pain but describes suprapubic pressure when she feels she has to go. Some urgency but no incontinence. No f/c/s or flank pain. In past ditropan for OAB helped, but no improvement in sxs this time. No GI sxs.   Assessment  1. OAB (overactive bladder)      Plan   OAB sxs:  Try change to mybetriq. Refer to urology for bladder testing.   Follow up: Return in about 3 months (around 10/17/2019) for complete physical, AWV.  10/28/2019  Orders Placed This Encounter  Procedures  . Ambulatory referral to Urology   Meds ordered this encounter  Medications  . mirabegron ER (MYRBETRIQ) 25 MG TB24 tablet    Sig: Take 1 tablet (25 mg total) by mouth daily.    Dispense:  30 tablet    Refill:  2    Start prior auth/ failed ditropan      I reviewed the patients updated PMH, FH, and SocHx.    Patient Active Problem List   Diagnosis Date Noted  . Severe obesity (BMI 35.0-39.9) with comorbidity ([place]) 08/04/2018    Priority: High  . Mixed hyperlipidemia 12/04/2017    Priority: High  . Chronic cough 07/24/2017    Priority: High  . Essential hypertension     Priority: High  . Chronic seasonal allergic rhinitis 12/04/2017    Priority: Medium  . Chronic pansinusitis 09/09/2017    Priority: Medium  . Laryngopharyngeal reflux (LPR) 09/09/2017    Priority: Medium  . Bilateral leg edema 03/30/2014    Priority: Medium  . History of gastritis 02/10/2014    Priority: Medium  .  Dysphagia 01/28/2014    Priority: Medium  . Irritable bowel syndrome (IBS) 09/08/2013    Priority: Medium  . Osteopenia 07/14/2013    Priority: Medium  . Osteoarthritis, multiple sites 07/14/2013    Priority: Medium  . Gastroesophageal reflux disease without esophagitis     Priority: Medium  . Atrophic vaginitis 10/22/2018    Priority: Low  . Status post total right knee replacement 01/24/2016    Priority: Low   Current Meds  Medication Sig  . albuterol (PROVENTIL HFA;VENTOLIN HFA) 108 (90 Base) MCG/ACT inhaler Inhale 2 puffs into the lungs every 4 (four) hours as needed for wheezing or shortness of breath.  Marland Kitchen albuterol (PROVENTIL) (2.5 MG/3ML) 0.083% nebulizer solution USE 1 VIAL VIA NEBULIZER EVERY 6 HOURS AS NEEDED FOR WHEEZING OR SHORTNESS OF BREATH  . atorvastatin (LIPITOR) 20 MG tablet Take 1 tablet (20 mg total) by mouth daily.  Marland Kitchen azelastine (ASTELIN) 0.1 % nasal spray Place 2 sprays into both nostrils 2 (two) times daily. Use in each nostril as directed  . carvedilol (COREG) 25 MG tablet Take 0.5 tablets (12.5 mg total) by mouth 2 (two) times daily.  . cetirizine (ZYRTEC ALLERGY) 10 MG tablet Take 1 tablet (10 mg total) by mouth daily.  . Cholecalciferol (VITAMIN D) 2000 UNITS CAPS Take 2,000 Units by mouth daily.   Marland Kitchen  clindamycin (CLEOCIN) 300 MG capsule TK 2 CS PO 30 TO 45 MINUTES PRIOR TO CLEANING/PROCEDURE  . ESTRACE VAGINAL 0.1 MG/GM vaginal cream   . linaclotide (LINZESS) 145 MCG CAPS capsule   . losartan (COZAAR) 50 MG tablet Take 1 tablet (50 mg total) by mouth daily.  . Multiple Vitamin (MULTIVITAMIN) tablet Take 1 tablet by mouth daily.  Marland Kitchen omeprazole (PRILOSEC) 20 MG capsule Take 1 capsule (20 mg total) by mouth 2 (two) times daily.  . polyethylene glycol powder (GLYCOLAX/MIRALAX) powder Take 17 g by mouth daily as needed.  . Probiotic Product (PROBIOTIC PO) Take by mouth.  . [DISCONTINUED] oxybutynin (DITROPAN XL) 10 MG 24 hr tablet Take 1 tablet (10 mg total) by  mouth at bedtime.    Allergies: Patient is allergic to penicillins; sulfa antibiotics; sulfasalazine; tramadol; ciprofloxacin; trimethoprim; and isovue [iopamidol]. Family History: Patient family history includes Arthritis in her father and mother; Heart disease in her mother; Mental illness in her father; Osteoporosis in her mother; Other in her mother; Stroke in her paternal grandfather; Sudden death in her father. Social History:  Patient  reports that she has never smoked. She has never used smokeless tobacco. She reports current alcohol use. She reports that she does not use drugs.  Review of Systems: Constitutional: Negative for fever malaise or anorexia Cardiovascular: negative for chest pain Respiratory: negative for SOB or persistent cough Gastrointestinal: negative for abdominal pain  Objective  Vitals: BP 122/84   Pulse 70   Temp 98.1 F (36.7 C) (Tympanic)   Resp 16   Ht 5\' 3"  (1.6 m)   Wt 193 lb (87.5 kg)   SpO2 95%   BMI 34.19 kg/m  General: no acute distress , A&Ox3 Cardiovascular:  RRR without murmur or gallop.  Respiratory:  Good breath sounds bilaterally, CTAB with normal respiratory effort Skin:  Warm,  Gastrointestinal: soft, flat abdomen, normal active bowel sounds, no palpable masses, no hepatosplenomegaly, no appreciated hernias      Commons side effects, risks, benefits, and alternatives for medications and treatment plan prescribed today were discussed, and the patient expressed understanding of the given instructions. Patient is instructed to call or message via MyChart if he/she has any questions or concerns regarding our treatment plan. No barriers to understanding were identified. We discussed Red Flag symptoms and signs in detail. Patient expressed understanding regarding what to do in case of urgent or emergency type symptoms.   Medication list was reconciled, printed and provided to the patient in AVS. Patient instructions and summary information  was reviewed with the patient as documented in the AVS. This note was prepared with assistance of Dragon voice recognition software. Occasional wrong-word or sound-a-like substitutions may have occurred due to the inherent limitations of voice recognition software

## 2019-07-17 NOTE — Telephone Encounter (Signed)
See note  Copied from [place] [id number]. Topic: Referral - Status >> Jul 17, 2019 11:21 AM Erick Blinks wrote: Reason for CRM: Requesting referral be sent to [facility] in [place] instead of [place], pt lives in [place]. Please advise

## 2019-07-17 NOTE — Patient Instructions (Signed)
Please return in Jan 2021 for your annual complete physical; please come fasting.  We will call you with information regarding your referral appointment. Urology.  If you do not hear from [place] within the next 2 weeks, please let me know. It can take 1-2 weeks to get appointments set up with the specialists.   We will start a new medication to see if we can help you before seeing the urologist.   If you have any questions or concerns, please don't hesitate to send me a message via MyChart or call the office at [phone]. Thank you for visiting with [place] today! It's our pleasure caring for you.

## 2019-07-20 ENCOUNTER — Telehealth: Payer: Self-pay | Admitting: Family Medicine

## 2019-07-20 NOTE — Telephone Encounter (Signed)
See note

## 2019-07-20 NOTE — Telephone Encounter (Signed)
mirabegron ER (MYRBETRIQ) 25 MG TB24 tablet Needs PA / please advise

## 2019-07-20 NOTE — Telephone Encounter (Signed)
PA received from pharmacy today, will work on approval

## 2019-07-22 NOTE — Telephone Encounter (Signed)
Pt aware and agreeable to referral  Erin West: Pt asking to go to [facility] not [facility]

## 2019-07-22 NOTE — Telephone Encounter (Signed)
All same class. Referred to urology; will defer next agent to them. Please notify pt.

## 2019-07-22 NOTE — Telephone Encounter (Signed)
I sent her to [facility] in [place].  I don't know why she thinks I sent her somewhere in [place] ?

## 2019-07-22 NOTE — Telephone Encounter (Signed)
Myrbetriq PA has been denied.   Option: call for appeal or send in for one of the following: Oxybutynin Chloride - Was previously taking Oxybutynin Chloride ER Tolterodine Tartrate Trospium Chloride ER Trospium Chloride Tolterodine Tartrate ER

## 2019-07-28 DIAGNOSIS — R350 Frequency of micturition: Secondary | ICD-10-CM | POA: Diagnosis not present

## 2019-07-28 DIAGNOSIS — N3941 Urge incontinence: Secondary | ICD-10-CM | POA: Diagnosis not present

## 2019-08-23 ENCOUNTER — Emergency Department (HOSPITAL_COMMUNITY): Payer: Medicare Other

## 2019-08-23 ENCOUNTER — Encounter (HOSPITAL_COMMUNITY): Payer: Self-pay | Admitting: Emergency Medicine

## 2019-08-23 ENCOUNTER — Other Ambulatory Visit: Payer: Self-pay

## 2019-08-23 ENCOUNTER — Emergency Department (HOSPITAL_COMMUNITY)
Admission: EM | Admit: 2019-08-23 | Discharge: 2019-08-24 | Disposition: A | Discharge status: Home / Self Care | Payer: Medicare Other | Attending: Emergency Medicine | Admitting: Emergency Medicine

## 2019-08-23 DIAGNOSIS — R109 Unspecified abdominal pain: Secondary | ICD-10-CM | POA: Diagnosis not present

## 2019-08-23 DIAGNOSIS — R079 Chest pain, unspecified: Secondary | ICD-10-CM | POA: Diagnosis not present

## 2019-08-23 DIAGNOSIS — Z79899 Other long term (current) drug therapy: Secondary | ICD-10-CM | POA: Insufficient documentation

## 2019-08-23 DIAGNOSIS — R030 Elevated blood-pressure reading, without diagnosis of hypertension: Secondary | ICD-10-CM

## 2019-08-23 DIAGNOSIS — I10 Essential (primary) hypertension: Secondary | ICD-10-CM | POA: Insufficient documentation

## 2019-08-23 DIAGNOSIS — R1012 Left upper quadrant pain: Secondary | ICD-10-CM | POA: Insufficient documentation

## 2019-08-23 DIAGNOSIS — R0902 Hypoxemia: Secondary | ICD-10-CM | POA: Diagnosis not present

## 2019-08-23 DIAGNOSIS — Z96652 Presence of left artificial knee joint: Secondary | ICD-10-CM | POA: Diagnosis not present

## 2019-08-23 DIAGNOSIS — M25512 Pain in left shoulder: Secondary | ICD-10-CM | POA: Diagnosis not present

## 2019-08-23 DIAGNOSIS — I700 Atherosclerosis of aorta: Secondary | ICD-10-CM | POA: Diagnosis not present

## 2019-08-23 DIAGNOSIS — R0789 Other chest pain: Secondary | ICD-10-CM | POA: Diagnosis not present

## 2019-08-23 DIAGNOSIS — M898X1 Other specified disorders of bone, shoulder: Secondary | ICD-10-CM

## 2019-08-23 LAB — URINALYSIS, ROUTINE W REFLEX MICROSCOPIC
Bilirubin Urine: NEGATIVE
Glucose, UA: NEGATIVE mg/dL
Hgb urine dipstick: NEGATIVE
Ketones, ur: NEGATIVE mg/dL
Leukocytes,Ua: NEGATIVE
Nitrite: NEGATIVE
Protein, ur: NEGATIVE mg/dL
Specific Gravity, Urine: 1.016 (ref 1.005–1.030)
pH: 7 (ref 5.0–8.0)

## 2019-08-23 LAB — CBC WITH DIFFERENTIAL/PLATELET
Abs Immature Granulocytes: 0.01 10*3/uL (ref 0.00–0.07)
Basophils Absolute: 0.1 10*3/uL (ref 0.0–0.1)
Basophils Relative: 2 %
Eosinophils Absolute: 0.2 10*3/uL (ref 0.0–0.5)
Eosinophils Relative: 5 %
HCT: 40.7 % (ref 36.0–46.0)
Hemoglobin: 13.4 g/dL (ref 12.0–15.0)
Immature Granulocytes: 0 %
Lymphocytes Relative: 23 %
Lymphs Abs: 1 10*3/uL (ref 0.7–4.0)
MCH: 31.3 pg (ref 26.0–34.0)
MCHC: 32.9 g/dL (ref 30.0–36.0)
MCV: 95.1 fL (ref 80.0–100.0)
Monocytes Absolute: 0.4 10*3/uL (ref 0.1–1.0)
Monocytes Relative: 9 %
Neutro Abs: 2.7 10*3/uL (ref 1.7–7.7)
Neutrophils Relative %: 61 %
Platelets: 219 10*3/uL (ref 150–400)
RBC: 4.28 MIL/uL (ref 3.87–5.11)
RDW: 12.9 % (ref 11.5–15.5)
WBC: 4.4 10*3/uL (ref 4.0–10.5)
nRBC: 0 % (ref 0.0–0.2)

## 2019-08-23 LAB — COMPREHENSIVE METABOLIC PANEL
ALT: 30 U/L (ref 0–44)
AST: 25 U/L (ref 15–41)
Albumin: 3.6 g/dL (ref 3.5–5.0)
Alkaline Phosphatase: 82 U/L (ref 38–126)
Anion gap: 8 (ref 5–15)
BUN: 20 mg/dL (ref 8–23)
CO2: 24 mmol/L (ref 22–32)
Calcium: 8.8 mg/dL — ABNORMAL LOW (ref 8.9–10.3)
Chloride: 108 mmol/L (ref 98–111)
Creatinine, Ser: 0.93 mg/dL (ref 0.44–1.00)
GFR calc Af Amer: >60 mL/min (ref >60)
GFR calc non Af Amer: >60 mL/min (ref >60)
Glucose, Bld: 88 mg/dL (ref 70–99)
Potassium: 4.6 mmol/L (ref 3.5–5.1)
Sodium: 140 mmol/L (ref 135–145)
Total Bilirubin: 0.8 mg/dL (ref 0.3–1.2)
Total Protein: 6.3 g/dL — ABNORMAL LOW (ref 6.5–8.1)

## 2019-08-23 LAB — D-DIMER, QUANTITATIVE: D-Dimer, Quant: 0.31 ug/mL-FEU (ref 0.00–0.50)

## 2019-08-23 MED ORDER — DIPHENHYDRAMINE HCL 25 MG PO CAPS: 50.0000 mg | ORAL_CAPSULE | Freq: Once | ORAL | Status: AC

## 2019-08-23 MED ORDER — HYDROCORTISONE NA SUCCINATE PF 250 MG IJ SOLR
200.0000 mg | Freq: Once | INTRAMUSCULAR | Status: AC
  Administered 2019-08-23: 200 mg via INTRAVENOUS
  Filled 2019-08-23: qty 200

## 2019-08-23 MED ORDER — DIPHENHYDRAMINE HCL 50 MG/ML IJ SOLN
50.0000 mg | Freq: Once | INTRAMUSCULAR | Status: AC
  Administered 2019-08-23: 50 mg via INTRAVENOUS
  Filled 2019-08-23 (×2): qty 1

## 2019-08-23 MED ORDER — IOHEXOL 350 MG/ML SOLN
100.0000 mL | Freq: Once | INTRAVENOUS | Status: AC | PRN
  Administered 2019-08-23: 100 mL via INTRAVENOUS

## 2019-08-23 MED ORDER — DIPHENHYDRAMINE HCL 50 MG/ML IJ SOLN
50.0000 mg | Freq: Once | INTRAMUSCULAR | Status: AC
  Administered 2019-08-23: 50 mg via INTRAVENOUS
  Filled 2019-08-23: qty 1

## 2019-08-23 NOTE — ED Provider Notes (Signed)
  Face-to-face evaluation   History: She presents for evaluation of left flank pain which started suddenly as she has bent over picking something up out of a box.  She states this is a very light thing, a candle.  No prior similar problem.  She is not known to have high blood pressure.  She was treated by EMS for possible cardiac source with nitroglycerin and aspirin.  No prior cardiac disease.  Physical exam: Alert, overweight, somewhat uncomfortable.  Heart regular rate and rhythm without murmur lungs clear.  Chest mild posterior tenderness.  No crepitation or deformity.  Medical screening examination/treatment/procedure(s) were conducted as a shared visit with non-physician practitioner(s) and myself.  I personally evaluated the patient during the encounter    Daleen Bo, MD 08/24/19 1147

## 2019-08-23 NOTE — ED Notes (Signed)
Pt ambulatory to the restroom with a steady gait.

## 2019-08-23 NOTE — ED Notes (Signed)
Pt given water and sandwich.

## 2019-08-23 NOTE — ED Provider Notes (Signed)
[facility] DEPARTMENT Provider Note   CSN: [id number] Arrival date & time: 08/23/19  1403     History   Chief Complaint Chief Complaint  Patient presents with  . Flank Pain  . Back Pain    HPI Erin West is a 68 y.o. female with a past medical history significant for anxiety, arthritis, hx DVT, hypertension, and hyperlipemia who presents to the ED via EMS due to sudden onset of left flank and left back pain around 11 AM this morning.  Patient notes the pain felt like a squeezing sensation and was located below her left shoulder blade and left flank that radiated to the midline.  Patient notes she was bending over when she began to feel the pain.  Patient states the pain was associated with difficulties catching her breath. She took her blood pressure once she felt this pain which was significantly elevated. BP when the EMS arrived was 190/110. She was given 324 ASA and 1 nitro which eased her pain. Patient denies urinary symptoms, but admits she was recently placed on a medication for "bladder irritation" which she has been taking for the past few weeks. She also notes right calf pain for the past few days. Patient denies hormonal therapy, recent surgeries, recent immobilizations, and hemoptysis. She does have a history of a blood clot. Patient denies fever, chills, abdominal pain, headache, and chest pain.   Past Medical History:  Diagnosis Date  . Anxiety   . Arthritis   . Aspiration pneumonia ([place]) 2013  . Atypical chest pain    Stress test 04/21/10 - post-stress EF=89%. Normal scan.  . Diarrhea   . Diastolic dysfunction    But normal LV function on ECHO 2011 and low risk Myoview in 2011  . DVT (deep venous thrombosis) ([place])   . Dyslipidemia   . GERD (gastroesophageal reflux disease)   . Hypertension   . Osteoarthritis   . Osteopenia   . Palpitations    [employer] MCT Monitor 04/16/10-04/22/10   . Syncope    Pain-mediated syncope    Patient  Active Problem List   Diagnosis Date Noted  . Atrophic vaginitis 10/22/2018  . Severe obesity (BMI 35.0-39.9) with comorbidity ([place]) 08/04/2018  . Mixed hyperlipidemia 12/04/2017  . Chronic seasonal allergic rhinitis 12/04/2017  . Chronic pansinusitis 09/09/2017  . Laryngopharyngeal reflux (LPR) 09/09/2017  . Chronic cough 07/24/2017  . Status post total right knee replacement 01/24/2016  . Bilateral leg edema 03/30/2014  . History of gastritis 02/10/2014  . Dysphagia 01/28/2014  . Irritable bowel syndrome (IBS) 09/08/2013  . Osteopenia 07/14/2013  . Osteoarthritis, multiple sites 07/14/2013  . Essential hypertension   . Gastroesophageal reflux disease without esophagitis     Past Surgical History:  Procedure Laterality Date  . CHOLECYSTECTOMY  2006  . KNEE ARTHROSCOPY Right   . REPLACEMENT TOTAL KNEE Left    left  . TUBAL LIGATION  06/1978     OB History    Gravida  3   Para  3   Term      Preterm      AB      Living  3     SAB      TAB      Ectopic      Multiple      Live Births               Home Medications    Prior to Admission medications   Medication Sig Start  Date End Date Taking? Authorizing Provider  acetaminophen (TYLENOL) 650 MG CR tablet Take 650 mg by mouth every 8 (eight) hours as needed for pain.   Yes [provider]  albuterol (PROVENTIL HFA;VENTOLIN HFA) 108 (90 Base) MCG/ACT inhaler Inhale 2 puffs into the lungs every 4 (four) hours as needed for wheezing or shortness of breath. 11/07/18  Yes Leamon Arnt, MD  albuterol (PROVENTIL) (2.5 MG/3ML) 0.083% nebulizer solution USE 1 VIAL VIA NEBULIZER EVERY 6 HOURS AS NEEDED FOR WHEEZING OR SHORTNESS OF BREATH Patient taking differently: Take 2.5 mg by nebulization every 4 (four) hours as needed for wheezing.  12/01/18  Yes Leamon Arnt, MD  atorvastatin (LIPITOR) 20 MG tablet Take 1 tablet (20 mg total) by mouth daily. 09/03/18  Yes Leamon Arnt, MD  azelastine (ASTELIN)  0.1 % nasal spray Place 2 sprays into both nostrils 2 (two) times daily. Use in each nostril as directed Patient taking differently: Place 2 sprays into both nostrils 2 (two) times daily as needed for rhinitis. Use in each nostril as directed 12/04/17  Yes Leamon Arnt, MD  carvedilol (COREG) 25 MG tablet Take 0.5 tablets (12.5 mg total) by mouth 2 (two) times daily. 02/02/19  Yes Leamon Arnt, MD  cetirizine (ZYRTEC ALLERGY) 10 MG tablet Take 1 tablet (10 mg total) by mouth daily. Patient taking differently: Take 10 mg by mouth daily as needed for allergies.  10/22/18  Yes Leamon Arnt, MD  Cholecalciferol (VITAMIN D) 2000 UNITS CAPS Take 2,000 Units by mouth daily.    Yes [provider]  ESTRACE VAGINAL 0.1 MG/GM vaginal cream Place 1 Applicatorful vaginally 3 (three) times a week.  09/14/18  Yes [provider]  linaclotide (LINZESS) 145 MCG CAPS capsule Take 145 mcg by mouth daily as needed (constipation).  08/09/17  Yes [provider]  losartan (COZAAR) 50 MG tablet Take 1 tablet (50 mg total) by mouth daily. 02/02/19  Yes Leamon Arnt, MD  mirabegron ER (MYRBETRIQ) 25 MG TB24 tablet Take 1 tablet (25 mg total) by mouth daily. 07/17/19  Yes Leamon Arnt, MD  Multiple Vitamin (MULTIVITAMIN) tablet Take 1 tablet by mouth daily.   Yes [provider]  omeprazole (PRILOSEC) 20 MG capsule Take 1 capsule (20 mg total) by mouth 2 (two) times daily. 02/02/19  Yes Leamon Arnt, MD  polyethylene glycol powder (GLYCOLAX/MIRALAX) powder Take 17 g by mouth daily as needed. Patient taking differently: Take 17 g by mouth daily as needed for mild constipation.  12/13/17  Yes Leamon Arnt, MD  Probiotic Product (PROBIOTIC PO) Take 1 capsule by mouth daily.    Yes [provider]    Family History Family History  Problem Relation Age of Onset  . Heart disease Mother   . Other Mother        leg amputation  . Arthritis Mother   . Osteoporosis  Mother   . Mental illness Father   . Sudden death Father   . Arthritis Father   . Stroke Paternal Grandfather     Social History Social History   Tobacco Use  . Smoking status: Never Smoker  . Smokeless tobacco: Never Used  Substance Use Topics  . Alcohol use: Yes    Alcohol/week: 0.0 standard drinks    Comment: 2-3/wine per month  . Drug use: No     Allergies   Penicillins, Sulfa antibiotics, Sulfasalazine, Tramadol, Ciprofloxacin, Trimethoprim, and Isovue [iopamidol]   Review of Systems  Review of Systems  Constitutional: Negative for chills and fever.  Respiratory: Positive for shortness of breath.   Cardiovascular: Positive for leg swelling. Negative for chest pain.  Gastrointestinal: Negative for abdominal pain, diarrhea, nausea and vomiting.  Musculoskeletal: Positive for back pain.  All other systems reviewed and are negative.    Physical Exam Updated Vital Signs BP 138/76   Pulse 63   Temp 98.6 F (37 C)   Resp 17   Ht 5\' 3"  (1.6 m)   Wt 80 kg   SpO2 96%   BMI 31.24 kg/m   Physical Exam Vitals signs and nursing note reviewed.  Constitutional:      General: She is not in acute distress.    Appearance: She is not toxic-appearing.  HENT:     Head: Normocephalic.  Eyes:     Pupils: Pupils are equal, round, and reactive to light.  Neck:     Musculoskeletal: Normal range of motion and neck supple.  Cardiovascular:     Rate and Rhythm: Normal rate and regular rhythm.     Pulses: Normal pulses.     Heart sounds: Normal heart sounds. No murmur. No friction rub. No gallop.   Pulmonary:     Effort: Pulmonary effort is normal.     Breath sounds: Normal breath sounds.  Abdominal:     General: Abdomen is flat. There is no distension.     Palpations: Abdomen is soft.     Tenderness: There is no abdominal tenderness. There is no right CVA tenderness, left CVA tenderness or guarding.     Comments: Abdomen soft, nondistended, nontender to palpation in all  quadrants without guarding or peritoneal signs. No rebound.    Musculoskeletal:     Comments: Right calf tenderness. No noticeable edema. Distal pulses and sensation intact bilaterally. No midline thoracic or lumbar tenderness. No reproducible tenderness along left scapula. No crepitus. No deformity.   Skin:    General: Skin is warm and dry.  Neurological:     General: No focal deficit present.     Mental Status: She is alert.      ED Treatments / Results  Labs (all labs ordered are listed, but only abnormal results are displayed) Labs Reviewed  CBC WITH DIFFERENTIAL/PLATELET  COMPREHENSIVE METABOLIC PANEL  URINALYSIS, ROUTINE W REFLEX MICROSCOPIC    EKG None  Radiology No results found.  Procedures Procedures (including critical care time)  Medications Ordered in ED Medications - No data to display   Initial Impression / Assessment and Plan / ED Course  I have reviewed the triage vital signs and the nursing notes.  Pertinent labs & imaging results that were available during my care of the patient were reviewed by me and considered in my medical decision making (see chart for details).       68 year old female presents to the ED due to sudden onset of left scapular and flank pain that occurred this morning around 11 AM.  When EMS arrived patient was found to have blood pressure of 190/110.  Upon arrival to the ED, patient is afebrile, normal heart rate, and maintaining o2 saturations at 96% on room air.  Patient in no acute distress and nontoxic-appearing.  Normal heart sounds.  Lungs clear to auscultation bilaterally.  Right calf tenderness, but no significant edema or erythema. Will order d-dimer to rule out PE/DVT given calf tenderness and history of blood clot.   EKG personally reviewed which demonstrates sinus rhythm with no signs of ischemia.  CT  renal study negative for kidney stones or acute abnormalities.  UA reassuring with no signs of infection.  D-dimer not  elevated.  CMP reassuring.  CBC with no leukocytosis and completely unremarkable.  Dr. Eulis Foster evaluated patient at bedside. Order placed for CTA to rule out dissection.   Patient handed off to [facility], PA-C at shift change who will follow-up with CTA results, reassess patient, and determine disposition.  Final Clinical Impressions(s) / ED Diagnoses   Final diagnoses:  None    ED Discharge Orders    None       Romie Levee 08/23/19 2347    Daleen Bo, MD 08/24/19 1147

## 2019-08-23 NOTE — ED Notes (Signed)
Confirmed pt transport to CT with CT tech at 2121.

## 2019-08-23 NOTE — ED Triage Notes (Signed)
11 am today pt had dull squeezing pain in left back and flaNK PAIN  NO N/V/SOB , PT GIVEN 324 ASA AND 1 NTRIO Per EMS with some pain relief pt has IV 18  Left ac

## 2019-08-23 NOTE — Discharge Instructions (Addendum)
Today all of your labs and imaging were normal. Schedule an appointment with your PCP within the next week to recheck your blood pressure. Your blood pressures were elevated today.    I am concerned that your pain may be musculoskeletal in nature. Please hydrate, rest, gently stretch and massage area. You may find warm soaks such as on a hot bathtub helpful. Please return if you have any new or concerning symptoms.

## 2019-08-24 ENCOUNTER — Telehealth: Payer: Self-pay | Admitting: Cardiology

## 2019-08-24 NOTE — Telephone Encounter (Signed)
Left message for patient of dr crenshaw's recommendations. 

## 2019-08-24 NOTE — Telephone Encounter (Addendum)
New message    Patient calling to report she went to ED on 11/29 for back pan and elevated BP.(190/110)   BP today 137/79 Still has some pain on left shoulder area Requesting Dr Stanford Breed review her CT results from ED

## 2019-08-24 NOTE — Telephone Encounter (Signed)
CT was without abnormality. [name]

## 2019-08-24 NOTE — Telephone Encounter (Signed)
Attempted to contact patient to see how she was doing now- patient did not answer, not able to leave message voicemail box was full. Will go ahead and route message to MD and primary nurse to make him aware that patient was recently in hospital yesterday.  Thanks!

## 2019-08-25 ENCOUNTER — Ambulatory Visit (INDEPENDENT_AMBULATORY_CARE_PROVIDER_SITE_OTHER): Payer: Medicare Other | Admitting: Family Medicine

## 2019-08-25 ENCOUNTER — Other Ambulatory Visit: Payer: Self-pay

## 2019-08-25 ENCOUNTER — Encounter: Payer: Self-pay | Admitting: Family Medicine

## 2019-08-25 VITALS — BP 140/100 | HR 84 | Temp 97.6°F | Ht 63.0 in | Wt 193.8 lb

## 2019-08-25 DIAGNOSIS — F430 Acute stress reaction: Secondary | ICD-10-CM | POA: Diagnosis not present

## 2019-08-25 DIAGNOSIS — M546 Pain in thoracic spine: Secondary | ICD-10-CM

## 2019-08-25 DIAGNOSIS — M150 Primary generalized (osteo)arthritis: Secondary | ICD-10-CM

## 2019-08-25 DIAGNOSIS — M8949 Other hypertrophic osteoarthropathy, multiple sites: Secondary | ICD-10-CM | POA: Diagnosis not present

## 2019-08-25 DIAGNOSIS — M159 Polyosteoarthritis, unspecified: Secondary | ICD-10-CM

## 2019-08-25 DIAGNOSIS — G8929 Other chronic pain: Secondary | ICD-10-CM

## 2019-08-25 MED ORDER — DICLOFENAC SODIUM 75 MG PO TBEC: 75.0000 mg | DELAYED_RELEASE_TABLET | Freq: Two times a day (BID) | ORAL | 0 refills | Status: DC

## 2019-08-25 MED ORDER — BUSPIRONE HCL 7.5 MG PO TABS: 7.5000 mg | ORAL_TABLET | Freq: Three times a day (TID) | ORAL | 2 refills | Status: DC | PRN

## 2019-08-25 MED ORDER — CYCLOBENZAPRINE HCL 10 MG PO TABS: 10.0000 mg | ORAL_TABLET | Freq: Every evening | ORAL | 0 refills | Status: DC | PRN

## 2019-08-25 NOTE — Progress Notes (Signed)
Subjective  CC:  Chief Complaint  Patient presents with  . Hospitalization Follow-up    seen 11/29 @ [facility] ED for L flank pain; radiates to R side    HPI: Erin West is a 68 y.o. female who presents to the office today to address the problems listed above in the chief complaint.  I reviewed er evaluation: reported increase in left flank pain: subscapular. Work up included cardiac, pulmonary with labs, ct angio, and cxr. Everything was reassuring. Pt reports has had pain in that area for months but had acute worsening. Hurts to touch. No strain or injury. Kept her awake last night. On advil with some relief. See er notes.   Anxiety: worries all the time now. Husband is undergoing cancer tx with chemotherapy. She is anxious, can't sleep and feels uptight all day. Denies depressive sxs. No h/o GAD. Had been on nerve meds awhile back after death of parents.   OA: knees and back. Likely contributing to back pain. Has f/u with ortho for right leg/knee pain this week.    Assessment  1. Chronic left-sided thoracic back pain   2. Stress reaction   3. Primary osteoarthritis involving multiple joints      Plan   Back pain:  Most c/w MSK pain. Educated- trial of nsaid, mm relaxer and consider PT. Pt to ask ortho about it as well.   Stress reaction: discussed options of treatment. Will start with trial of buspar. May use daily or prn. F/u with me if not improving.   OA: nsaids and ortho f/u.    Follow up: No follow-ups on file.  10/28/2019  No orders of the defined types were placed in this encounter.  Meds ordered this encounter  Medications  . diclofenac (VOLTAREN) 75 MG EC tablet    Sig: Take 1 tablet (75 mg total) by mouth 2 (two) times daily.    Dispense:  30 tablet    Refill:  0  . busPIRone (BUSPAR) 7.5 MG tablet    Sig: Take 1 tablet (7.5 mg total) by mouth 3 (three) times daily as needed (anxiety).    Dispense:  30 tablet    Refill:  2  . cyclobenzaprine (FLEXERIL) 10 MG  tablet    Sig: Take 1 tablet (10 mg total) by mouth at bedtime as needed for muscle spasms.    Dispense:  30 tablet    Refill:  0      I reviewed the patients updated PMH, FH, and SocHx.    Patient Active Problem List   Diagnosis Date Noted  . Severe obesity (BMI 35.0-39.9) with comorbidity ([place]) 08/04/2018    Priority: High  . Mixed hyperlipidemia 12/04/2017    Priority: High  . Chronic cough 07/24/2017    Priority: High  . Essential hypertension     Priority: High  . Chronic seasonal allergic rhinitis 12/04/2017    Priority: Medium  . Chronic pansinusitis 09/09/2017    Priority: Medium  . Laryngopharyngeal reflux (LPR) 09/09/2017    Priority: Medium  . Bilateral leg edema 03/30/2014    Priority: Medium  . History of gastritis 02/10/2014    Priority: Medium  . Dysphagia 01/28/2014    Priority: Medium  . Irritable bowel syndrome (IBS) 09/08/2013    Priority: Medium  . Osteopenia 07/14/2013    Priority: Medium  . Osteoarthritis, multiple sites 07/14/2013    Priority: Medium  . Gastroesophageal reflux disease without esophagitis     Priority: Medium  . Atrophic vaginitis  10/22/2018    Priority: Low  . Status post total right knee replacement 01/24/2016    Priority: Low   No outpatient medications have been marked as taking for the 08/25/19 encounter (Office Visit) with Leamon Arnt, MD.    Allergies: Patient is allergic to penicillins; sulfa antibiotics; sulfasalazine; tramadol; ciprofloxacin; trimethoprim; and isovue [iopamidol]. Family History: Patient family history includes Arthritis in her father and mother; Heart disease in her mother; Mental illness in her father; Osteoporosis in her mother; Other in her mother; Stroke in her paternal grandfather; Sudden death in her father. Social History:  Patient  reports that she has never smoked. She has never used smokeless tobacco. She reports current alcohol use. She reports that she does not use drugs.  Review of  Systems: Constitutional: Negative for fever malaise or anorexia Cardiovascular: negative for chest pain Respiratory: negative for SOB or persistent cough Gastrointestinal: negative for abdominal pain  Objective  Vitals: BP (!) 140/100 (BP Location: Left Arm, Patient Position: Sitting, Cuff Size: Large)   Pulse 84   Temp 97.6 F (36.4 C) (Skin)   Ht 5\' 3"  (1.6 m)   Wt 193 lb 12.8 oz (87.9 kg)   SpO2 94%   BMI 34.33 kg/m  General: no acute distress , A&Ox3 HEENT: PEERL, conjunctiva normal, Oropharynx moist,neck is supple Cardiovascular:  RRR without murmur or gallop.  Respiratory:  Good breath sounds bilaterally, CTAB with normal respiratory effort Skin:  Warm, no rashes Back: point ttp subscapular on left. Left shoulder is lower than right. ? Scoliosis or mm spasm.      Commons side effects, risks, benefits, and alternatives for medications and treatment plan prescribed today were discussed, and the patient expressed understanding of the given instructions. Patient is instructed to call or message via MyChart if he/she has any questions or concerns regarding our treatment plan. No barriers to understanding were identified. We discussed Red Flag symptoms and signs in detail. Patient expressed understanding regarding what to do in case of urgent or emergency type symptoms.   Medication list was reconciled, printed and provided to the patient in AVS. Patient instructions and summary information was reviewed with the patient as documented in the AVS. This note was prepared with assistance of Dragon voice recognition software. Occasional wrong-word or sound-a-like substitutions may have occurred due to the inherent limitations of voice recognition software  This visit occurred during the SARS-CoV-2 public health emergency.  Safety protocols were in place, including screening questions prior to the visit, additional usage of staff PPE, and extensive cleaning of exam room while observing  appropriate contact time as indicated for disinfecting solutions.

## 2019-08-25 NOTE — Patient Instructions (Signed)
Please return in 4 weeks for recheck.   If you have any questions or concerns, please don't hesitate to send me a message via MyChart or call the office at [phone]. Thank you for visiting with [place] today! It's our pleasure caring for you.   Stress Stress is a normal reaction to life events. Stress is what you feel when life demands more than you are used to, or more than you think you can handle. Some stress can be useful, such as studying for a test or meeting a deadline at work. Stress that occurs too often or for too long can cause problems. It can affect your emotional health and interfere with relationships and normal daily activities. Too much stress can weaken your body's defense system (immune system) and increase your risk for physical illness. If you already have a medical problem, stress can make it worse. What are the causes? All sorts of life events can cause stress. An event that causes stress for one person may not be stressful for another person. Major life events, whether positive or negative, commonly cause stress. Examples include:  Losing a job or starting a new job.  Losing a loved one.  Moving to a new town or home.  Getting married or divorced.  Having a baby.  Injury or illness. Less obvious life events can also cause stress, especially if they occur day after day or in combination with each other. Examples include:  Working long hours.  Driving in traffic.  Caring for children.  Being in debt.  Being in a difficult relationship. What are the signs or symptoms? Stress can cause emotional symptoms, including:  Anxiety. This is feeling worried, afraid, on edge, overwhelmed, or out of control.  Anger, including irritation or impatience.  Depression. This is feeling sad, down, helpless, or guilty.  Trouble focusing, remembering, or making decisions. Stress can cause physical symptoms, including:  Aches and pains. These may affect your head, neck,  back, stomach, or other areas of your body.  Tight muscles or a clenched jaw.  Low energy.  Trouble sleeping. Stress can cause unhealthy behaviors, including:  Eating to feel better (overeating) or skipping meals.  Working too much or putting off tasks.  Smoking, drinking alcohol, or using drugs to feel better. How is this diagnosed? Stress is diagnosed through an assessment by your health care provider. He or she may diagnose this condition based on:  Your symptoms and any stressful life events.  Your medical history.  Tests to rule out other causes of your symptoms. Depending on your condition, your health care provider may refer you to a specialist for further evaluation. How is this treated?  Stress management techniques are the recommended treatment for stress. Medicine is not typically recommended for the treatment of stress. Techniques to reduce your reaction to stressful life events include:  Stress identification. Monitor yourself for symptoms of stress and identify what causes stress for you. These skills may help you to avoid or prepare for stressful events.  Time management. Set your priorities, keep a calendar of events, and learn to say "no." Taking these actions can help you avoid making too many commitments. Techniques for coping with stress include:  Rethinking the problem. Try to think realistically about stressful events rather than ignoring them or overreacting. Try to find the positives in a stressful situation rather than focusing on the negatives.  Exercise. Physical exercise can release both physical and emotional tension. The key is to find a form of exercise  that you enjoy and do it regularly.  Relaxation techniques. These relax the body and mind. The key is to find one or more that you enjoy and use the technique(s) regularly. Examples include: ? Meditation, deep breathing, or progressive relaxation techniques. ? Yoga or tai chi. ? Biofeedback,  mindfulness techniques, or journaling. ? Listening to music, being out in nature, or participating in other hobbies.  Practicing a healthy lifestyle. Eat a balanced diet, drink plenty of water, limit or avoid caffeine, and get plenty of sleep.  Having a strong support network. Spend time with family, friends, or other people you enjoy being around. Express your feelings and talk things over with someone you trust. Counseling or talk therapy with a mental health professional may be helpful if you are having trouble managing stress on your own. Follow these instructions at home: Lifestyle   Avoid drugs.  Do not use any products that contain nicotine or tobacco, such as cigarettes and e-cigarettes. If you need help quitting, ask your health care provider.  Limit alcohol intake to no more than 1 drink a day for nonpregnant women and 2 drinks a day for men. One drink equals 12 oz of beer, 5 oz of wine, or 1 oz of hard liquor.  Do not use alcohol or drugs to relax.  Eat a balanced diet that includes fresh fruits and vegetables, whole grains, lean meats, fish, eggs, and beans, and low-fat dairy. Avoid processed foods and foods high in added fat, sugar, and salt.  Exercise at least 30 minutes on 5 or more days each week.  Get 7-8 hours of sleep each night. General instructions   Practice stress management techniques as discussed with your health care provider.  Drink enough fluid to keep your urine clear or pale yellow.  Take over-the-counter and prescription medicines only as told by your health care provider.  Keep all follow-up visits as told by your health care provider. This is important. Contact a health care provider if:  Your symptoms get worse.  You have new symptoms.  You feel overwhelmed by your problems and can no longer manage them on your own. Get help right away if:  You have thoughts of hurting yourself or others. If you ever feel like you may hurt yourself or  others, or have thoughts about taking your own life, get help right away. You can go to your nearest emergency department or call:  Your local emergency services (911 in the [place]  A suicide crisis helpline, such as the [facility] at 210-433-7866. This is open 24 hours a day. Summary  Stress is a normal reaction to life events. It can cause problems if it happens too often or for too long.  Practicing stress management techniques is the best way to treat stress.  Counseling or talk therapy with a mental health professional may be helpful if you are having trouble managing stress on your own. This information is not intended to replace advice given to you by your health care provider. Make sure you discuss any questions you have with your health care provider. Document Released: 03/06/2001 Document Revised: 08/23/2017 Document Reviewed: 10/31/2016 Elsevier Patient Education  2020 [employer].

## 2019-08-27 DIAGNOSIS — R350 Frequency of micturition: Secondary | ICD-10-CM | POA: Diagnosis not present

## 2019-08-27 DIAGNOSIS — N3941 Urge incontinence: Secondary | ICD-10-CM | POA: Diagnosis not present

## 2019-08-28 ENCOUNTER — Other Ambulatory Visit: Payer: Self-pay

## 2019-08-28 DIAGNOSIS — Z20822 Contact with and (suspected) exposure to covid-19: Secondary | ICD-10-CM

## 2019-08-28 DIAGNOSIS — Z20828 Contact with and (suspected) exposure to other viral communicable diseases: Secondary | ICD-10-CM | POA: Diagnosis not present

## 2019-08-30 LAB — NOVEL CORONAVIRUS, NAA: SARS-CoV-2, NAA: NOT DETECTED

## 2019-09-07 ENCOUNTER — Other Ambulatory Visit: Payer: Self-pay

## 2019-09-07 DIAGNOSIS — Z20822 Contact with and (suspected) exposure to covid-19: Secondary | ICD-10-CM

## 2019-09-07 DIAGNOSIS — Z20828 Contact with and (suspected) exposure to other viral communicable diseases: Secondary | ICD-10-CM | POA: Diagnosis not present

## 2019-09-08 LAB — NOVEL CORONAVIRUS, NAA: SARS-CoV-2, NAA: NOT DETECTED

## 2019-09-21 ENCOUNTER — Other Ambulatory Visit: Payer: Self-pay

## 2019-09-22 ENCOUNTER — Ambulatory Visit: Payer: Medicare Other | Admitting: Family Medicine

## 2019-09-29 ENCOUNTER — Ambulatory Visit (INDEPENDENT_AMBULATORY_CARE_PROVIDER_SITE_OTHER): Payer: Medicare Other | Admitting: Family Medicine

## 2019-09-29 ENCOUNTER — Other Ambulatory Visit: Payer: Self-pay

## 2019-09-29 ENCOUNTER — Encounter: Payer: Self-pay | Admitting: Family Medicine

## 2019-09-29 VITALS — BP 122/68 | HR 63 | Temp 96.3°F | Ht 63.0 in | Wt 191.2 lb

## 2019-09-29 DIAGNOSIS — M8949 Other hypertrophic osteoarthropathy, multiple sites: Secondary | ICD-10-CM

## 2019-09-29 DIAGNOSIS — G8929 Other chronic pain: Secondary | ICD-10-CM

## 2019-09-29 DIAGNOSIS — R300 Dysuria: Secondary | ICD-10-CM

## 2019-09-29 DIAGNOSIS — F430 Acute stress reaction: Secondary | ICD-10-CM

## 2019-09-29 DIAGNOSIS — M159 Polyosteoarthritis, unspecified: Secondary | ICD-10-CM

## 2019-09-29 DIAGNOSIS — M546 Pain in thoracic spine: Secondary | ICD-10-CM | POA: Diagnosis not present

## 2019-09-29 MED ORDER — CYCLOBENZAPRINE HCL 10 MG PO TABS: 10.0000 mg | ORAL_TABLET | Freq: Every evening | ORAL | 2 refills | Status: DC | PRN

## 2019-09-29 MED ORDER — DICLOFENAC SODIUM 75 MG PO TBEC: 75.0000 mg | DELAYED_RELEASE_TABLET | Freq: Two times a day (BID) | ORAL | 2 refills | Status: DC

## 2019-09-29 NOTE — Progress Notes (Signed)
Subjective  CC:  Chief Complaint  Patient presents with  . Back Pain    HPI: Erin West is a 69 y.o. female who presents to the office today to address the problems listed above in the chief complaint.  F/u thoracic back pain: improved. Still with some soreness and intermittent twinges of pain, but not like before. Used nsaid and mm relaxer with benefit. Not needing either now. Ortho agrees most c/w MSK etiology. Stretching and yoga at home. No new or worsening sxs.   stress: her husband is improving on chemo. She is worrying less in part due to use of buspar. Feels it is helpful. Sleep is still disrupted due nocturia. A little brighter mood. No AEs from buspar bid.   Knee and back pain have calmed down a bit as well.   Irritative bladder sxs: seeing urologist. On myrbetriq w/o significant relief. Had cystoscopy with inflammation. To have bladder biopsy soon.   Assessment  1. Chronic left-sided thoracic back pain   2. Stress reaction   3. Primary osteoarthritis involving multiple joints   4. Dysuria      Plan   Subscapular back pain left:  Improving. Reassured. Stretches, heat, mm relaxer and nsaid as needed.   Stress rxn: improved. Continue buspar for next 3-9 months. Then reevaluate. F/u with me if worsening.   OA: improved  Dysuria/OAB: work up ongoing. ? IC.   Follow up: No follow-ups on file.  10/28/2019  No orders of the defined types were placed in this encounter.  Meds ordered this encounter  Medications  . cyclobenzaprine (FLEXERIL) 10 MG tablet    Sig: Take 1 tablet (10 mg total) by mouth at bedtime as needed for muscle spasms.    Dispense:  30 tablet    Refill:  2  . diclofenac (VOLTAREN) 75 MG EC tablet    Sig: Take 1 tablet (75 mg total) by mouth 2 (two) times daily.    Dispense:  30 tablet    Refill:  2      I reviewed the patients updated PMH, FH, and SocHx.    Patient Active Problem List   Diagnosis Date Noted  . Severe obesity (BMI  35.0-39.9) with comorbidity ([place]) 08/04/2018    Priority: High  . Mixed hyperlipidemia 12/04/2017    Priority: High  . Chronic cough 07/24/2017    Priority: High  . Essential hypertension     Priority: High  . Chronic seasonal allergic rhinitis 12/04/2017    Priority: Medium  . Chronic pansinusitis 09/09/2017    Priority: Medium  . Laryngopharyngeal reflux (LPR) 09/09/2017    Priority: Medium  . Bilateral leg edema 03/30/2014    Priority: Medium  . History of gastritis 02/10/2014    Priority: Medium  . Dysphagia 01/28/2014    Priority: Medium  . Irritable bowel syndrome (IBS) 09/08/2013    Priority: Medium  . Osteopenia 07/14/2013    Priority: Medium  . Osteoarthritis, multiple sites 07/14/2013    Priority: Medium  . Gastroesophageal reflux disease without esophagitis     Priority: Medium  . Atrophic vaginitis 10/22/2018    Priority: Low  . Status post total right knee replacement 01/24/2016    Priority: Low   Current Meds  Medication Sig  . acetaminophen (TYLENOL) 650 MG CR tablet Take 650 mg by mouth every 8 (eight) hours as needed for pain.  Marland Kitchen albuterol (PROVENTIL HFA;VENTOLIN HFA) 108 (90 Base) MCG/ACT inhaler Inhale 2 puffs into the lungs every 4 (four) hours  as needed for wheezing or shortness of breath.  Marland Kitchen albuterol (PROVENTIL) (2.5 MG/3ML) 0.083% nebulizer solution USE 1 VIAL VIA NEBULIZER EVERY 6 HOURS AS NEEDED FOR WHEEZING OR SHORTNESS OF BREATH (Patient taking differently: Take 2.5 mg by nebulization every 4 (four) hours as needed for wheezing. )  . atorvastatin (LIPITOR) 20 MG tablet Take 1 tablet (20 mg total) by mouth daily.  Marland Kitchen azelastine (ASTELIN) 0.1 % nasal spray Place 2 sprays into both nostrils 2 (two) times daily. Use in each nostril as directed (Patient taking differently: Place 2 sprays into both nostrils 2 (two) times daily as needed for rhinitis. Use in each nostril as directed)  . busPIRone (BUSPAR) 7.5 MG tablet Take 1 tablet (7.5 mg total) by mouth  3 (three) times daily as needed (anxiety).  . carvedilol (COREG) 25 MG tablet Take 0.5 tablets (12.5 mg total) by mouth 2 (two) times daily.  . cetirizine (ZYRTEC ALLERGY) 10 MG tablet Take 1 tablet (10 mg total) by mouth daily. (Patient taking differently: Take 10 mg by mouth daily as needed for allergies. )  . Cholecalciferol (VITAMIN D) 2000 UNITS CAPS Take 2,000 Units by mouth daily.   . diclofenac (VOLTAREN) 75 MG EC tablet Take 1 tablet (75 mg total) by mouth 2 (two) times daily.  Marland Kitchen ESTRACE VAGINAL 0.1 MG/GM vaginal cream Place 1 Applicatorful vaginally 3 (three) times a week.   . losartan (COZAAR) 50 MG tablet Take 1 tablet (50 mg total) by mouth daily.  . mirabegron ER (MYRBETRIQ) 25 MG TB24 tablet Take 1 tablet (25 mg total) by mouth daily.  . Multiple Vitamin (MULTIVITAMIN) tablet Take 1 tablet by mouth daily.  Marland Kitchen omeprazole (PRILOSEC) 20 MG capsule Take 1 capsule (20 mg total) by mouth 2 (two) times daily.  . polyethylene glycol powder (GLYCOLAX/MIRALAX) powder Take 17 g by mouth daily as needed. (Patient taking differently: Take 17 g by mouth daily as needed for mild constipation. )  . Probiotic Product (PROBIOTIC PO) Take 1 capsule by mouth daily.   . [DISCONTINUED] diclofenac (VOLTAREN) 75 MG EC tablet Take 1 tablet (75 mg total) by mouth 2 (two) times daily.    Allergies: Patient is allergic to penicillins; sulfa antibiotics; sulfasalazine; tramadol; ciprofloxacin; trimethoprim; and isovue [iopamidol]. Family History: Patient family history includes Arthritis in her father and mother; Heart disease in her mother; Mental illness in her father; Osteoporosis in her mother; Other in her mother; Stroke in her paternal grandfather; Sudden death in her father. Social History:  Patient  reports that she has never smoked. She has never used smokeless tobacco. She reports current alcohol use. She reports that she does not use drugs.  Review of Systems: Constitutional: Negative for fever  malaise or anorexia Cardiovascular: negative for chest pain Respiratory: negative for SOB or persistent cough Gastrointestinal: negative for abdominal pain  Objective  Vitals: BP 122/68 (BP Location: Right Arm, Patient Position: Sitting, Cuff Size: Normal)   Pulse 63   Temp (!) 96.3 F (35.7 C) (Temporal)   Ht 5\' 3"  (1.6 m)   Wt 191 lb 3.2 oz (86.7 kg)   SpO2 99%   BMI 33.87 kg/m  General: no acute distress , A&Ox3 Back: left subscapular ttp present w/o spasm or knot, no midline ttp    Commons side effects, risks, benefits, and alternatives for medications and treatment plan prescribed today were discussed, and the patient expressed understanding of the given instructions. Patient is instructed to call or message via MyChart if he/she has any questions  or concerns regarding our treatment plan. No barriers to understanding were identified. We discussed Red Flag symptoms and signs in detail. Patient expressed understanding regarding what to do in case of urgent or emergency type symptoms.   Medication list was reconciled, printed and provided to the patient in AVS. Patient instructions and summary information was reviewed with the patient as documented in the AVS. This note was prepared with assistance of Dragon voice recognition software. Occasional wrong-word or sound-a-like substitutions may have occurred due to the inherent limitations of voice recognition software  This visit occurred during the SARS-CoV-2 public health emergency.  Safety protocols were in place, including screening questions prior to the visit, additional usage of staff PPE, and extensive cleaning of exam room while observing appropriate contact time as indicated for disinfecting solutions.

## 2019-09-29 NOTE — Patient Instructions (Signed)
Please follow up as scheduled for your next visit with me: 10/28/2019   If you have any questions or concerns, please don't hesitate to send me a message via MyChart or call the office at [phone]. Thank you for visiting with [place] today! It's our pleasure caring for you.

## 2019-10-07 ENCOUNTER — Telehealth: Payer: Self-pay | Admitting: Family Medicine

## 2019-10-07 DIAGNOSIS — M5432 Sciatica, left side: Secondary | ICD-10-CM | POA: Diagnosis not present

## 2019-10-07 NOTE — Telephone Encounter (Signed)
Patient calls in today saying that she spoke with Dr.Andy awhile back about some back pain/issues and she is wanting to see Dr.David Ottewell in [place] for pain management, but they are needing a referral. She just wants to know if she needs to schedule to an appointment in order to have a referral placed or if andy could just put one in.

## 2019-10-07 NOTE — Telephone Encounter (Signed)
Please advise 

## 2019-10-08 NOTE — Telephone Encounter (Signed)
Please let her know that chronic pain management is typically for patients who have moderate to severe pain that has not been improved with multiple therapies. These patients typically are using strong narcotic pain medications as they have failed other treatments.   I do not believe this is what she needs; if she feels she would like his opinion however, you may make the referral.  thanks

## 2019-10-09 ENCOUNTER — Other Ambulatory Visit: Payer: Self-pay | Admitting: Family Medicine

## 2019-10-09 ENCOUNTER — Other Ambulatory Visit: Payer: Self-pay

## 2019-10-09 DIAGNOSIS — G8929 Other chronic pain: Secondary | ICD-10-CM

## 2019-10-09 DIAGNOSIS — M546 Pain in thoracic spine: Secondary | ICD-10-CM

## 2019-10-09 NOTE — Telephone Encounter (Signed)
Spoke with patient and should would like to continue with the referral to see pain management, Dr. Arabella Merles.   Unable to place/find in system, stephanie can you refer her please. Thank you

## 2019-10-09 NOTE — Telephone Encounter (Signed)
PM referral placed. Thanks Step!

## 2019-10-09 NOTE — Telephone Encounter (Signed)
I need you guys to place the referral.  I believe its [id number] - amb referral to pain clinic.  You don't have to put the provider - I can do that.

## 2019-10-13 DIAGNOSIS — R350 Frequency of micturition: Secondary | ICD-10-CM | POA: Diagnosis not present

## 2019-10-13 DIAGNOSIS — N3941 Urge incontinence: Secondary | ICD-10-CM | POA: Diagnosis not present

## 2019-10-14 DIAGNOSIS — N3000 Acute cystitis without hematuria: Secondary | ICD-10-CM | POA: Diagnosis not present

## 2019-10-19 DIAGNOSIS — M546 Pain in thoracic spine: Secondary | ICD-10-CM | POA: Diagnosis not present

## 2019-10-19 DIAGNOSIS — M5136 Other intervertebral disc degeneration, lumbar region: Secondary | ICD-10-CM | POA: Diagnosis not present

## 2019-10-22 ENCOUNTER — Ambulatory Visit: Payer: Medicare Other

## 2019-10-28 ENCOUNTER — Other Ambulatory Visit: Payer: Self-pay

## 2019-10-28 ENCOUNTER — Telehealth: Payer: Self-pay | Admitting: Family Medicine

## 2019-10-28 ENCOUNTER — Ambulatory Visit (INDEPENDENT_AMBULATORY_CARE_PROVIDER_SITE_OTHER): Payer: Medicare Other | Admitting: Family Medicine

## 2019-10-28 ENCOUNTER — Encounter: Payer: Self-pay | Admitting: Family Medicine

## 2019-10-28 ENCOUNTER — Ambulatory Visit: Payer: Medicare Other

## 2019-10-28 VITALS — BP 126/82 | HR 78 | Temp 97.9°F | Ht 63.0 in | Wt 191.4 lb

## 2019-10-28 DIAGNOSIS — M8949 Other hypertrophic osteoarthropathy, multiple sites: Secondary | ICD-10-CM

## 2019-10-28 DIAGNOSIS — I10 Essential (primary) hypertension: Secondary | ICD-10-CM | POA: Diagnosis not present

## 2019-10-28 DIAGNOSIS — M159 Polyosteoarthritis, unspecified: Secondary | ICD-10-CM

## 2019-10-28 DIAGNOSIS — N9089 Other specified noninflammatory disorders of vulva and perineum: Secondary | ICD-10-CM | POA: Diagnosis not present

## 2019-10-28 DIAGNOSIS — K580 Irritable bowel syndrome with diarrhea: Secondary | ICD-10-CM

## 2019-10-28 DIAGNOSIS — R05 Cough: Secondary | ICD-10-CM | POA: Diagnosis not present

## 2019-10-28 DIAGNOSIS — R202 Paresthesia of skin: Secondary | ICD-10-CM | POA: Diagnosis not present

## 2019-10-28 DIAGNOSIS — F430 Acute stress reaction: Secondary | ICD-10-CM

## 2019-10-28 DIAGNOSIS — K219 Gastro-esophageal reflux disease without esophagitis: Secondary | ICD-10-CM | POA: Diagnosis not present

## 2019-10-28 DIAGNOSIS — N3281 Overactive bladder: Secondary | ICD-10-CM | POA: Diagnosis not present

## 2019-10-28 DIAGNOSIS — M8580 Other specified disorders of bone density and structure, unspecified site: Secondary | ICD-10-CM | POA: Diagnosis not present

## 2019-10-28 DIAGNOSIS — E782 Mixed hyperlipidemia: Secondary | ICD-10-CM

## 2019-10-28 DIAGNOSIS — R053 Chronic cough: Secondary | ICD-10-CM

## 2019-10-28 LAB — CBC WITH DIFFERENTIAL/PLATELET
Basophils Absolute: 0.1 10*3/uL (ref 0.0–0.1)
Basophils Relative: 1.5 % (ref 0.0–3.0)
Eosinophils Absolute: 0.1 10*3/uL (ref 0.0–0.7)
Eosinophils Relative: 3.2 % (ref 0.0–5.0)
HCT: 41.4 % (ref 36.0–46.0)
Hemoglobin: 13.9 g/dL (ref 12.0–15.0)
Lymphocytes Relative: 30.9 % (ref 12.0–46.0)
Lymphs Abs: 1.3 10*3/uL (ref 0.7–4.0)
MCHC: 33.5 g/dL (ref 30.0–36.0)
MCV: 92.3 fl (ref 78.0–100.0)
Monocytes Absolute: 0.4 10*3/uL (ref 0.1–1.0)
Monocytes Relative: 8.3 % (ref 3.0–12.0)
Neutro Abs: 2.4 10*3/uL (ref 1.4–7.7)
Neutrophils Relative %: 56.1 % (ref 43.0–77.0)
Platelets: 239 10*3/uL (ref 150.0–400.0)
RBC: 4.49 Mil/uL (ref 3.87–5.11)
RDW: 13.5 % (ref 11.5–15.5)
WBC: 4.3 10*3/uL (ref 4.0–10.5)

## 2019-10-28 LAB — COMPREHENSIVE METABOLIC PANEL
ALT: 17 U/L (ref 0–35)
AST: 14 U/L (ref 0–37)
Albumin: 4 g/dL (ref 3.5–5.2)
Alkaline Phosphatase: 88 U/L (ref 39–117)
BUN: 28 mg/dL — ABNORMAL HIGH (ref 6–23)
CO2: 29 mEq/L (ref 19–32)
Calcium: 9.5 mg/dL (ref 8.4–10.5)
Chloride: 102 mEq/L (ref 96–112)
Creatinine, Ser: 0.85 mg/dL (ref 0.40–1.20)
GFR: 66.35 mL/min (ref >60.00)
Glucose, Bld: 101 mg/dL — ABNORMAL HIGH (ref 70–99)
Potassium: 4.3 mEq/L (ref 3.5–5.1)
Sodium: 138 mEq/L (ref 135–145)
Total Bilirubin: 0.9 mg/dL (ref 0.2–1.2)
Total Protein: 6.5 g/dL (ref 6.0–8.3)

## 2019-10-28 LAB — LIPID PANEL
Cholesterol: 163 mg/dL (ref 0–200)
HDL: 53 mg/dL (ref >39.00)
LDL Cholesterol: 94 mg/dL (ref 0–99)
NonHDL: 110.27
Total CHOL/HDL Ratio: 3
Triglycerides: 80 mg/dL (ref 0.0–149.0)
VLDL: 16 mg/dL (ref 0.0–40.0)

## 2019-10-28 LAB — TSH: TSH: 2.72 u[IU]/mL (ref 0.35–4.50)

## 2019-10-28 LAB — VITAMIN B12: Vitamin B-12: 875 pg/mL (ref 211–911)

## 2019-10-28 MED ORDER — BUSPIRONE HCL 7.5 MG PO TABS: 7.5000 mg | ORAL_TABLET | Freq: Three times a day (TID) | ORAL | 3 refills | Status: DC | PRN

## 2019-10-28 NOTE — Progress Notes (Signed)
Subjective  Chief Complaint  Patient presents with  . Annual Exam    Fasting. wants to discuss gyn/pigmentation in the vaginal area  . Hypertension    taking losartan. no side effects. denies SOB, dizziness, and HA  . Hyperlipidemia    exercises about 3 times a week. takes losartan    HPI: Erin West is a 69 y.o. female who presents to [facility] today for a Female Wellness Visit. She also has the concerns and/or needs as listed above in the chief complaint. These will be addressed in addition to the Health Maintenance Visit.   Wellness Visit: annual visit with health maintenance review and exam without Pap   HM: screens up to date. Due AWV. Due mammo in march. Chronic disease f/u and/or acute problem visit: (deemed necessary to be done in addition to the wellness visit):  HTN: Feeling well. Taking medications w/o adverse effects. No symptoms of CHF, angina; no palpitations, sob, cp or lower extremity edema. Compliant with meds.   HLD: fasting for recheck. Has been well controlled.   Cough/LPR: comes and goes. On allergy meds.   OAB per urology. Reports had two cystoscopes: improved on the second one. Need to get records. Recently treated for UTI; oab sxs finally improving on myrbetriq somewhat  GERD on chronic ppi. Requests b12 check. Some skin paresthesias  OA: back pain persists but intermittently. Knee ok  Vulvar lesion per urology. No pain, itching or bleeding. No h/o skin cancer  Stress reaction: husband is improving. Had good response to cancer treatments. Pt is feeling better. buspar helps control worry.   Assessment  1. Mixed hyperlipidemia   2. Essential hypertension   3. Chronic cough   4. Laryngopharyngeal reflux (LPR)   5. Irritable bowel syndrome with diarrhea   6. Osteopenia, unspecified location   7. Gastroesophageal reflux disease without esophagitis   8. Primary osteoarthritis involving multiple joints   9. OAB  (overactive bladder)   10. Stress reaction   11. Vulvar lesion   12. Paresthesia of skin       Plan  Female Wellness Visit:  Age appropriate Health Maintenance and Prevention measures were discussed with patient. Included topics are cancer screening recommendations, ways to keep healthy (see AVS) including dietary and exercise recommendations, regular eye and dental care, use of seat belts, and avoidance of moderate alcohol use and tobacco use. mammo due. dexa due next year. crc screen up to date  BMI: discussed patient's BMI and encouraged positive lifestyle modifications to help get to or maintain a target BMI.  HM needs and immunizations were addressed and ordered. See below for orders. See HM and immunization section for updates. utd  Routine labs and screening tests ordered including cmp, cbc and lipids where appropriate.  Discussed recommendations regarding Vit D and calcium supplementation (see AVS)  Chronic disease management visit and/or acute problem visit:  HTN: This medical condition is well controlled. There are no signs of complications, medication side effects, or red flags. Patient is instructed to continue the current treatment plan without change in therapies or medications.   HLD: recheck today on statin. Fasting  GERD: stable. Check vit b12/ paresthesias  IBS is stable  OA improving.   Vulvar lesion: return for punch biopsy to ensure benign. Discussed with pt today  Stress reaction: improved. Continue buspar.  Follow up: Return in about 6 months (around 04/26/2020) for follow up Hypertension.  Orders Placed This Encounter  Procedures  . Comprehensive  metabolic panel  . CBC with Differential/Platelet  . Lipid panel  . TSH  . Vitamin B12   Meds ordered this encounter  Medications  . busPIRone (BUSPAR) 7.5 MG tablet    Sig: Take 1 tablet (7.5 mg total) by mouth 3 (three) times daily as needed (anxiety).    Dispense:  180 tablet    Refill:  3       Lifestyle: Body mass index is 33.9 kg/m. Wt Readings from Last 3 Encounters:  10/28/19 191 lb 6.4 oz (86.8 kg)  09/29/19 191 lb 3.2 oz (86.7 kg)  08/25/19 193 lb 12.8 oz (87.9 kg)     Patient Active Problem List   Diagnosis Date Noted  . Mixed hyperlipidemia 12/04/2017    Priority: High    Last Assessment & Plan:  Compliant with statin, recheck today   . Chronic cough 07/24/2017    Priority: High     Persistent cough.  Ongoing for almost a year now.  Has seen ENT, pulmonology, gastroenterology without any clear diagnosis.  She reports worsening of cough in the morning with a.m. hoarseness and drainage.  She does have allergies.  She has seen an allergist many many years ago.  On Flonase and Zyrtec nightly.  CT scan of the sinuses was negative for infection.  PFTs were normal.  Chest x-ray was normal.  She is on chronic high-dose PPI and has been evaluated for esophageal dysmotility by Dr. Collene Mares.  They have not found a cause for her persistent cough.  She is on losartan Pulmonary consult January 2019, PFT normal No change off ARB (losartan).     . Essential hypertension     Priority: High    Overview:  Normal Stress Test 2012 by [facility]; ECHO EF 65% with mild diastolic dysfunction 99991111     . OAB (overactive bladder) 10/28/2019    Priority: Medium  . Chronic seasonal allergic rhinitis 12/04/2017    Priority: Medium  . Chronic pansinusitis 09/09/2017    Priority: Medium  . Laryngopharyngeal reflux (LPR) 09/09/2017    Priority: Medium  . Bilateral leg edema 03/30/2014    Priority: Medium    Overview:  Normal labs; ECHO with EF 123456 and mild diastolic dysfunction; doppler pending on left.  Lasix 40 bid to manage in high humidity. Does fine during winter. TED hose   . History of gastritis 02/10/2014    Priority: Medium    Overview:  01/2014 dr. Collene Mares   . Dysphagia 01/28/2014    Priority: Medium    Overview:  Dr. Collene Mares - esophagus dysmobility and reflux on barium  swallow.  Last Assessment & Plan:  Patient is reporting some dysphasia and acid reflux. I recommended she increased her omeprazole to 20 mg twice daily and schedule follow-up with GI   . Irritable bowel syndrome (IBS) 09/08/2013    Priority: Medium    Overview:  evaluation Dr. Collene Mares 2015 upper endoscopy with diffuse gastritis. Stopped nsaids.   . Osteopenia 07/14/2013    Priority: Medium    DEXA 11/2018: stable osteopenia, T = -1.5 lowest femer; recheck 2-3 years. DEXA 2014, mild osteopenia, T = -1.56 lowest (almost normal); on calcium and vit D; + Fh of osteoporosis   . Osteoarthritis, multiple sites 07/14/2013    Priority: Medium    Overview:  Followed by [facility]; MRI done in past - DJD and spurs. S/p knee replacement. Dr. Jaynee Eagles  Hand injection:  Injections 2018, Dr. Lindell Noe   . Gastroesophageal reflux disease without esophagitis  Priority: Medium  . Atrophic vaginitis 10/22/2018    Priority: Low  . Status post total right knee replacement 01/24/2016    Priority: Low   Health Maintenance  Topic Date Due  . MAMMOGRAM  11/26/2019  . DEXA SCAN  11/25/2020  . TETANUS/TDAP  12/18/2025  . COLONOSCOPY  08/03/2026  . INFLUENZA VACCINE  Completed  . Hepatitis C Screening  Completed  . PNA vac Low Risk Adult  Completed   Immunization History  Administered Date(s) Administered  . Influenza Split 06/24/2013  . Influenza, High Dose Seasonal PF 05/23/2016, 06/28/2017, 06/12/2018, 05/17/2019  . Influenza, Seasonal, Injecte, Preservative Fre 05/27/2014, 07/11/2015  . Pneumococcal Conjugate-13 10/29/2016  . Pneumococcal Polysaccharide-23 07/25/2012, 11/06/2017, 05/17/2019  . Tdap 09/24/2010, 12/19/2015  . Zoster 09/25/2011  . Zoster Recombinat (Shingrix) 08/09/2017, 11/06/2017   We updated and reviewed the patient's past history in detail and it is documented below. Allergies: Patient is allergic to penicillins; sulfa antibiotics; sulfasalazine; tramadol; ciprofloxacin;  trimethoprim; and isovue [iopamidol]. Past Medical History Patient  has a past medical history of Anxiety, Arthritis, Aspiration pneumonia ([place]) (2013), Atypical chest pain, Diarrhea, Diastolic dysfunction, DVT (deep venous thrombosis) ([place]), Dyslipidemia, GERD (gastroesophageal reflux disease), Hypertension, Osteoarthritis, Osteopenia, Palpitations, and Syncope. Past Surgical History Patient  has a past surgical history that includes Cholecystectomy (2006); Tubal ligation (06/1978); Replacement total knee (Left); and Knee arthroscopy (Right). Family History: Patient family history includes Arthritis in her father and mother; Heart disease in her mother; Mental illness in her father; Osteoporosis in her mother; Other in her mother; Stroke in her paternal grandfather; Sudden death in her father. Social History:  Patient  reports that she has never smoked. She has never used smokeless tobacco. She reports current alcohol use. She reports that she does not use drugs.  Review of Systems: Constitutional: negative for fever or malaise Ophthalmic: negative for photophobia, double vision or loss of vision Cardiovascular: negative for chest pain, dyspnea on exertion, or new LE swelling Respiratory: negative for SOB or persistent cough Gastrointestinal: negative for abdominal pain, change in bowel habits or melena Musculoskeletal: negative for new gait disturbance or muscular weakness Integumentary: negative for new or persistent rashes, no breast lumps Neurological: negative for TIA or stroke symptoms Psychiatric: negative for SI or delusions Allergic/Immunologic: negative for hives  Patient Care Team    Relationship Specialty Notifications Start End  Leamon Arnt, MD PCP - General Family Medicine  03/19/16   Donzetta Sprung., MD Consulting Physician Orthopedic Surgery  12/04/17   Daryll Brod, MD Consulting Physician Orthopedic Surgery  12/04/17   Juanita Craver, MD Consulting Physician  Gastroenterology  12/04/17   Inocencio Homes, DPM Consulting Physician Podiatry  10/22/18   [name], MD Consulting Physician Cardiology  10/22/18   Gregery Na, NP  Nurse Practitioner  10/22/18   Bjorn Loser, MD Consulting Physician Urology  10/28/19     Objective  Vitals: BP 126/82 (BP Location: Right Arm, Patient Position: Sitting, Cuff Size: Normal)   Pulse 78   Temp 97.9 F (36.6 C) (Temporal)   Ht 5\' 3"  (1.6 m)   Wt 191 lb 6.4 oz (86.8 kg)   SpO2 98%   BMI 33.90 kg/m  General:  Well developed, well nourished, no acute distress  Psych:  Alert and orientedx3,normal mood and affect HEENT:  Normocephalic, atraumatic, non-icteric sclera, PERRL supple neck without adenopathy, mass or thyromegaly Cardiovascular:  Normal S1, S2, RRR without gallop, rub or murmur Respiratory:  Good breath sounds bilaterally, CTAB with normal  respiratory effort Gastrointestinal: normal bowel sounds, soft, non-tender, no noted masses. No HSM MSK: no deformities, contusions. Joints are without erythema or swelling. Spine and CVA region are nontender Skin:  Warm, no rashes or suspicious lesions noted Neurologic:    Mental status is normal. CN 2-11 are normal. Gross motor and sensory exams are normal. Normal gait. No tremor Breast Exam: No mass, skin retraction or nipple discharge is appreciated in either breast. No axillary adenopathy. Fibrocystic changes are not noted Pelvic Exam: Normal external genitalia, right vulva with freckle like lesions: more medially there is a 4-5 mm darker hyperpigmented irregular border lesion w/o flaking or redness   Commons side effects, risks, benefits, and alternatives for medications and treatment plan prescribed today were discussed, and the patient expressed understanding of the given instructions. Patient is instructed to call or message via MyChart if he/she has any questions or concerns regarding our treatment plan. No barriers to understanding were  identified. We discussed Red Flag symptoms and signs in detail. Patient expressed understanding regarding what to do in case of urgent or emergency type symptoms.   Medication list was reconciled, printed and provided to the patient in AVS. Patient instructions and summary information was reviewed with the patient as documented in the AVS. This note was prepared with assistance of Dragon voice recognition software. Occasional wrong-word or sound-a-like substitutions may have occurred due to the inherent limitations of voice recognition software  This visit occurred during the SARS-CoV-2 public health emergency.  Safety protocols were in place, including screening questions prior to the visit, additional usage of staff PPE, and extensive cleaning of exam room while observing appropriate contact time as indicated for disinfecting solutions.

## 2019-10-28 NOTE — Telephone Encounter (Signed)
Patient called in this afternoon and said her and Dr.Andy discussed placing a referral to a gynecologist for her issues, patient would like to follow up and get that referral. Mollie would like someone within [place] if possible.

## 2019-10-28 NOTE — Patient Instructions (Addendum)
Please return in 6 months for follow up of your hypertension.  Medicare recommends an Annual Wellness Visit for all patients. Please schedule this to be done with our Nurse Educator, Loma Sousa. This is an informative "talk" visit; it's goals are to ensure that your health care needs are being met and to give you education regarding avoiding falls, ensuring you are not suffering from depression or problems with memory or thinking, and to educate you on Advance Care Planning. It helps me take good care of you!  I will release your lab results to you on your MyChart account with further instructions. Please reply with any questions.   If you have any questions or concerns, please don't hesitate to send me a message via MyChart or call the office at [phone]. Thank you for visiting with Erin West today! It's our pleasure caring for you.  Recommendations for women to keep healthy:   EXERCISE AND DIET: We recommended that you start or continue a regular exercise program for good health. Regular exercise means any activity that makes your heart beat faster and makes you sweat. We recommend exercising at least 30 minutes per day at least 3 days a week, preferably 4 or 5. We also recommend a diet low in fat and sugar. Inactivity, poor dietary choices and obesity can cause diabetes, heart attack, stroke, and kidney damage, among others.   ALCOHOL AND SMOKING: Women should limit their alcohol intake to no more than 7 drinks/beers/glasses of wine (combined, not each!) per week. Moderation of alcohol intake to this level decreases your risk of breast cancer and liver damage. And of course, no recreational drugs are part of a healthy lifestyle. And absolutely no smoking or even second hand smoke. Most people know smoking can cause heart and lung diseases, but did you know it also contributes to weakening of your bones? Aging of your skin? Yellowing of your teeth and nails?  CALCIUM AND VITAMIN D: Adequate intake of  calcium and Vitamin D are recommended. The recommendations for exact amounts of these supplements seem to change often, but generally speaking 600 mg of calcium (either carbonate or citrate) and 800 units of Vitamin D per day seems prudent. Certain women may benefit from higher intake of Vitamin D. If you are among these women, your doctor will have told you during your visit.    MAMMOGRAMS: All women over 96 years old should have a yearly mammogram. Many facilities now offer a "3D" mammogram, which may cost around $50 extra out of pocket. If possible, we recommend you accept the option to have the 3D mammogram performed. It both reduces the number of women who will be called back for extra views which then turn out to be normal, and it is better than the routine mammogram at detecting truly abnormal areas.   COLONOSCOPY: Colonoscopy to screen for colon cancer is recommended for all women at age 18. We know, you hate the idea of the prep. We agree, BUT, having colon cancer and not knowing it is worse!! Colon cancer so often starts as a polyp that can be seen and removed at colonscopy, which can quite literally save your life! And if your first colonoscopy is normal and you have no family history of colon cancer, most women don't have to have it again for 10 years. Once every ten years, you can do something that may end up saving your life, right? We will be happy to help you get it scheduled when you are ready. Be sure  to check your insurance coverage so you understand how much it will cost. It may be covered as a preventative service at no cost, but you should check your particular policy.

## 2019-10-29 ENCOUNTER — Encounter: Payer: Self-pay | Admitting: Family Medicine

## 2019-10-29 ENCOUNTER — Ambulatory Visit (INDEPENDENT_AMBULATORY_CARE_PROVIDER_SITE_OTHER): Payer: Medicare Other

## 2019-10-29 DIAGNOSIS — Z0000 Encounter for general adult medical examination without abnormal findings: Secondary | ICD-10-CM

## 2019-10-29 NOTE — Telephone Encounter (Signed)
We decided to schedule an appt with me for a quick punch biopsy. This is scheduled

## 2019-10-29 NOTE — Patient Instructions (Signed)
Erin West , Thank you for taking time to come for your Medicare Wellness Visit. I appreciate your ongoing commitment to your health goals. Please review the following plan we discussed and let me know if I can assist you in the future.   Screening recommendations/referrals: Colorectal Screening: up to date; last colonoscopy 08/03/16 (repeat due 2027) Mammogram: up to date; last 11/26/18 Bone Density: up to date; last 11/26/18 (every 2 years)   Vision and Dental Exams: Recommended annual ophthalmology exams for early detection of glaucoma and other disorders of the eye Recommended annual dental exams for proper oral hygiene  Vaccinations: Influenza vaccine: completed 05/17/19 Pneumococcal vaccine: up to date; last 05/17/19 Tdap vaccine: up to date; last 12/19/15 Shingles vaccine: Shingrix completed   Advanced directives: Please bring a copy of your POA (Power of [place]) and/or Living Will to your next appointment.  Goals: Recommend to drink at least 6-8 8oz glasses of water per day and consume a balanced diet rich in fresh fruits and vegetables.   Next appointment: Please schedule your Annual Wellness Visit with your Nurse Health Advisor in one year.  Preventive Care 76 Years and Older, Female Preventive care refers to lifestyle choices and visits with your health care provider that can promote health and wellness. What does preventive care include?  A yearly physical exam. This is also called an annual well check.  Dental exams once or twice a year.  Routine eye exams. Ask your health care provider how often you should have your eyes checked.  Personal lifestyle choices, including:  Daily care of your teeth and gums.  Regular physical activity.  Eating a healthy diet.  Avoiding tobacco and drug use.  Limiting alcohol use.  Practicing safe sex.  Taking low-dose aspirin every day if recommended by your health care provider.  Taking vitamin and mineral supplements as  recommended by your health care provider. What happens during an annual well check? The services and screenings done by your health care provider during your annual well check will depend on your age, overall health, lifestyle risk factors, and family history of disease. Counseling  Your health care provider may ask you questions about your:  Alcohol use.  Tobacco use.  Drug use.  Emotional well-being.  Home and relationship well-being.  Sexual activity.  Eating habits.  History of falls.  Memory and ability to understand (cognition).  Work and work Statistician.  Reproductive health. Screening  You may have the following tests or measurements:  Height, weight, and BMI.  Blood pressure.  Lipid and cholesterol levels. These may be checked every 5 years, or more frequently if you are over 49 years old.  Skin check.  Lung cancer screening. You may have this screening every year starting at age 20 if you have a 30-pack-year history of smoking and currently smoke or have quit within the past 15 years.  Fecal occult blood test (FOBT) of the stool. You may have this test every year starting at age 84.  Flexible sigmoidoscopy or colonoscopy. You may have a sigmoidoscopy every 5 years or a colonoscopy every 10 years starting at age 6.  Hepatitis C blood test.  Hepatitis B blood test.  Sexually transmitted disease (STD) testing.  Diabetes screening. This is done by checking your blood sugar (glucose) after you have not eaten for a while (fasting). You may have this done every 1-3 years.  Bone density scan. This is done to screen for osteoporosis. You may have this done starting at age 31.  Mammogram. This may be done every 1-2 years. Talk to your health care provider about how often you should have regular mammograms. Talk with your health care provider about your test results, treatment options, and if necessary, the need for more tests. Vaccines  Your health care  provider may recommend certain vaccines, such as:  Influenza vaccine. This is recommended every year.  Tetanus, diphtheria, and acellular pertussis (Tdap, Td) vaccine. You may need a Td booster every 10 years.  Zoster vaccine. You may need this after age 38.  Pneumococcal 13-valent conjugate (PCV13) vaccine. One dose is recommended after age 91.  Pneumococcal polysaccharide (PPSV23) vaccine. One dose is recommended after age 29. Talk to your health care provider about which screenings and vaccines you need and how often you need them. This information is not intended to replace advice given to you by your health care provider. Make sure you discuss any questions you have with your health care provider. Document Released: 10/07/2015 Document Revised: 05/30/2016 Document Reviewed: 07/12/2015 Elsevier Interactive Patient Education  2017 [employer] Prevention in the Home Falls can cause injuries. They can happen to people of all ages. There are many things you can do to make your home safe and to help prevent falls. What can I do on the outside of my home?  Regularly fix the edges of walkways and driveways and fix any cracks.  Remove anything that might make you trip as you walk through a door, such as a raised step or threshold.  Trim any bushes or trees on the path to your home.  Use bright outdoor lighting.  Clear any walking paths of anything that might make someone trip, such as rocks or tools.  Regularly check to see if handrails are loose or broken. Make sure that both sides of any steps have handrails.  Any raised decks and porches should have guardrails on the edges.  Have any leaves, snow, or ice cleared regularly.  Use sand or salt on walking paths during winter.  Clean up any spills in your garage right away. This includes oil or grease spills. What can I do in the bathroom?  Use night lights.  Install grab bars by the toilet and in the tub and shower. Do  not use towel bars as grab bars.  Use non-skid mats or decals in the tub or shower.  If you need to sit down in the shower, use a plastic, non-slip stool.  Keep the floor dry. Clean up any water that spills on the floor as soon as it happens.  Remove soap buildup in the tub or shower regularly.  Attach bath mats securely with double-sided non-slip rug tape.  Do not have throw rugs and other things on the floor that can make you trip. What can I do in the bedroom?  Use night lights.  Make sure that you have a light by your bed that is easy to reach.  Do not use any sheets or blankets that are too big for your bed. They should not hang down onto the floor.  Have a firm chair that has side arms. You can use this for support while you get dressed.  Do not have throw rugs and other things on the floor that can make you trip. What can I do in the kitchen?  Clean up any spills right away.  Avoid walking on wet floors.  Keep items that you use a lot in easy-to-reach places.  If you need to reach  something above you, use a strong step stool that has a grab bar.  Keep electrical cords out of the way.  Do not use floor polish or wax that makes floors slippery. If you must use wax, use non-skid floor wax.  Do not have throw rugs and other things on the floor that can make you trip. What can I do with my stairs?  Do not leave any items on the stairs.  Make sure that there are handrails on both sides of the stairs and use them. Fix handrails that are broken or loose. Make sure that handrails are as long as the stairways.  Check any carpeting to make sure that it is firmly attached to the stairs. Fix any carpet that is loose or worn.  Avoid having throw rugs at the top or bottom of the stairs. If you do have throw rugs, attach them to the floor with carpet tape.  Make sure that you have a light switch at the top of the stairs and the bottom of the stairs. If you do not have them,  ask someone to add them for you. What else can I do to help prevent falls?  Wear shoes that:  Do not have high heels.  Have rubber bottoms.  Are comfortable and fit you well.  Are closed at the toe. Do not wear sandals.  If you use a stepladder:  Make sure that it is fully opened. Do not climb a closed stepladder.  Make sure that both sides of the stepladder are locked into place.  Ask someone to hold it for you, if possible.  Clearly mark and make sure that you can see:  Any grab bars or handrails.  First and last steps.  Where the edge of each step is.  Use tools that help you move around (mobility aids) if they are needed. These include:  Canes.  Walkers.  Scooters.  Crutches.  Turn on the lights when you go into a dark area. Replace any light bulbs as soon as they burn out.  Set up your furniture so you have a clear path. Avoid moving your furniture around.  If any of your floors are uneven, fix them.  If there are any pets around you, be aware of where they are.  Review your medicines with your doctor. Some medicines can make you feel dizzy. This can increase your chance of falling. Ask your doctor what other things that you can do to help prevent falls. This information is not intended to replace advice given to you by your health care provider. Make sure you discuss any questions you have with your health care provider. Document Released: 07/07/2009 Document Revised: 02/16/2016 Document Reviewed: 10/15/2014 Elsevier Interactive Patient Education  2017 [employer].

## 2019-10-29 NOTE — Progress Notes (Signed)
This visit is being conducted via phone call due to the COVID-19 pandemic. This patient has given me verbal consent via phone to conduct this visit, patient states they are participating from their home address. Some vital signs may be absent or patient reported.   Patient identification: identified by name, DOB, and current address.  Location provider: [facility], Office Persons participating in the virtual visit: Denman George LPN, patient, and Dr. Billey Chang    Subjective:   Erin West is a 69 y.o. female who presents for Medicare Annual (Subsequent) preventive examination.  Review of Systems:   Cardiac Risk Factors include: advanced age (>52men, >55 women);hypertension  Objective:     Vitals: There were no vitals taken for this visit.  There is no height or weight on file to calculate BMI.  Advanced Directives 10/29/2019 08/23/2019 10/22/2018 02/01/2016 02/22/2012  Does Patient Have a Medical Advance Directive? Yes Yes Yes Yes Patient has advance directive, copy not in chart  Type of Advance Directive Living will;Healthcare Power of [place];Living will [place];Living will Living will [place];Living will  Does patient want to make changes to medical advance directive? No - Patient declined - - - -  Copy of [place] in Chart? No - copy requested No - copy requested No - copy requested - Copy requested from family  Would patient like information on creating a medical advance directive? - No - Patient declined - - -    Tobacco Social History   Tobacco Use  Smoking Status Never Smoker  Smokeless Tobacco Never Used     Counseling given: Not Answered   Clinical Intake:  Pre-visit preparation completed: Yes  Pain : No/denies pain  Diabetes: No  How often do you need to have someone help you when you read instructions, pamphlets, or other written materials from your doctor or  pharmacy?: 1 - Never  Interpreter Needed?: No  Information entered by :: Denman George LPN  Past Medical History:  Diagnosis Date  . Anxiety   . Arthritis   . Aspiration pneumonia ([place]) 2013  . Atypical chest pain    Stress test 04/21/10 - post-stress EF=89%. Normal scan.  . Diarrhea   . Diastolic dysfunction    But normal LV function on ECHO 2011 and low risk Myoview in 2011  . DVT (deep venous thrombosis) ([place])   . Dyslipidemia   . GERD (gastroesophageal reflux disease)   . Hypertension   . Osteoarthritis   . Osteopenia   . Palpitations    [employer] MCT Monitor 04/16/10-04/22/10   . Syncope    Pain-mediated syncope   Past Surgical History:  Procedure Laterality Date  . CHOLECYSTECTOMY  2006  . KNEE ARTHROSCOPY Right   . REPLACEMENT TOTAL KNEE Left    left  . TUBAL LIGATION  06/1978   Family History  Problem Relation Age of Onset  . Heart disease Mother   . Other Mother        leg amputation  . Arthritis Mother   . Osteoporosis Mother   . Mental illness Father   . Sudden death Father   . Arthritis Father   . Stroke Paternal Grandfather    Social History   Socioeconomic History  . Marital status: Married    Spouse name: Not on file  . Number of children: 3  . Years of education: Not on file  . Highest education level: Not on file  Occupational History  .  Not on file  Tobacco Use  . Smoking status: Never Smoker  . Smokeless tobacco: Never Used  Substance and Sexual Activity  . Alcohol use: Yes    Alcohol/week: 0.0 standard drinks    Comment: 2-3/wine per month  . Drug use: No  . Sexual activity: Never  Other Topics Concern  . Not on file  Social History Narrative  . Not on file   Social Determinants of Health   Financial Resource Strain:   . Difficulty of Paying Living Expenses: Not on file  Food Insecurity:   . Worried About Charity fundraiser in the Last Year: Not on file  . Ran Out of Food in the Last Year: Not on file  Transportation  Needs:   . Lack of Transportation (Medical): Not on file  . Lack of Transportation (Non-Medical): Not on file  Physical Activity:   . Days of Exercise per Week: Not on file  . Minutes of Exercise per Session: Not on file  Stress:   . Feeling of Stress : Not on file  Social Connections:   . Frequency of Communication with Friends and Family: Not on file  . Frequency of Social Gatherings with Friends and Family: Not on file  . Attends Religious Services: Not on file  . Active Member of Clubs or Organizations: Not on file  . Attends Archivist Meetings: Not on file  . Marital Status: Not on file    Outpatient Encounter Medications as of 10/29/2019  Medication Sig  . acetaminophen (TYLENOL) 650 MG CR tablet Take 650 mg by mouth every 8 (eight) hours as needed for pain.  Marland Kitchen albuterol (PROVENTIL HFA;VENTOLIN HFA) 108 (90 Base) MCG/ACT inhaler Inhale 2 puffs into the lungs every 4 (four) hours as needed for wheezing or shortness of breath.  Marland Kitchen albuterol (PROVENTIL) (2.5 MG/3ML) 0.083% nebulizer solution USE 1 VIAL VIA NEBULIZER EVERY 6 HOURS AS NEEDED FOR WHEEZING OR SHORTNESS OF BREATH (Patient taking differently: Take 2.5 mg by nebulization every 4 (four) hours as needed for wheezing. )  . atorvastatin (LIPITOR) 20 MG tablet Take 1 tablet (20 mg total) by mouth daily.  Marland Kitchen azelastine (ASTELIN) 0.1 % nasal spray Place 2 sprays into both nostrils 2 (two) times daily. Use in each nostril as directed (Patient taking differently: Place 2 sprays into both nostrils 2 (two) times daily as needed for rhinitis. Use in each nostril as directed)  . busPIRone (BUSPAR) 7.5 MG tablet Take 1 tablet (7.5 mg total) by mouth 3 (three) times daily as needed (anxiety).  . carvedilol (COREG) 25 MG tablet Take 0.5 tablets (12.5 mg total) by mouth 2 (two) times daily.  . cetirizine (ZYRTEC ALLERGY) 10 MG tablet Take 1 tablet (10 mg total) by mouth daily. (Patient taking differently: Take 10 mg by mouth daily as  needed for allergies. )  . Cholecalciferol (VITAMIN D) 2000 UNITS CAPS Take 2,000 Units by mouth daily.   . cyclobenzaprine (FLEXERIL) 10 MG tablet Take 1 tablet (10 mg total) by mouth at bedtime as needed for muscle spasms. (Patient not taking: Reported on 10/28/2019)  . diclofenac (VOLTAREN) 75 MG EC tablet Take 1 tablet (75 mg total) by mouth 2 (two) times daily.  Marland Kitchen ESTRACE VAGINAL 0.1 MG/GM vaginal cream Place 1 Applicatorful vaginally 3 (three) times a week.   . losartan (COZAAR) 50 MG tablet Take 1 tablet (50 mg total) by mouth daily.  . mirabegron ER (MYRBETRIQ) 25 MG TB24 tablet Take 1 tablet (25 mg total)  by mouth daily.  . Multiple Vitamin (MULTIVITAMIN) tablet Take 1 tablet by mouth daily.  Marland Kitchen omeprazole (PRILOSEC) 20 MG capsule Take 1 capsule (20 mg total) by mouth 2 (two) times daily.  . phenazopyridine (PYRIDIUM) 100 MG tablet Take 100 mg by mouth 3 (three) times daily as needed for pain.  . polyethylene glycol powder (GLYCOLAX/MIRALAX) powder Take 17 g by mouth daily as needed. (Patient taking differently: Take 17 g by mouth daily as needed for mild constipation. )  . Probiotic Product (PROBIOTIC PO) Take 1 capsule by mouth daily.    No facility-administered encounter medications on file as of 10/29/2019.    Activities of Daily Living In your present state of health, do you have any difficulty performing the following activities: 10/29/2019 04/22/2019  Hearing? N N  Vision? N Y  Difficulty concentrating or making decisions? N N  Walking or climbing stairs? N N  Dressing or bathing? N N  Doing errands, shopping? N N  Preparing Food and eating ? N -  Using the Toilet? N -  In the past six months, have you accidently leaked urine? N -  Do you have problems with loss of bowel control? N -  Managing your Medications? N -  Managing your Finances? N -  Housekeeping or managing your Housekeeping? N -  Some recent data might be hidden    Patient Care Team: Leamon Arnt, MD as PCP  - General (Family Medicine) Donzetta Sprung., MD as Consulting Physician (Orthopedic Surgery) Daryll Brod, MD as Consulting Physician (Orthopedic Surgery) Juanita Craver, MD as Consulting Physician (Gastroenterology) Inocencio Homes, DPM as Consulting Physician (Podiatry) Stanford Breed, Denice Bors, MD as Consulting Physician (Cardiology) Milas Hock, Stephani Police, NP (Nurse Practitioner) Bjorn Loser, MD as Consulting Physician (Urology) Delfino Lovett, PA-C as Consulting Physician (Dermatology)    Assessment:   This is a routine wellness examination for Erin West.  Exercise Activities and Dietary recommendations Current Exercise Habits: Home exercise routine, Type of exercise: calisthenics;Other - see comments(stationary bike), Time (Minutes): 60, Frequency (Times/Week): 4, Weekly Exercise (Minutes/Week): 240, Intensity: Moderate  Goals    . Weight (lb) < 185 lb (83.9 kg)     Lose weight by decreasing caloric intake and increasing water.        Fall Risk Fall Risk  10/29/2019 04/22/2019 02/02/2019 10/22/2018 12/04/2017  Falls in the past year? 0 0 0 0 No  Comment - - - Tripped (did not fall), broke 2 toes -  Number falls in past yr: 0 0 0 - -  Injury with Fall? 0 0 0 - -  Follow up Falls evaluation completed;Education provided;Falls prevention discussed Falls evaluation completed Falls evaluation completed - -   Is the patient's home free of loose throw rugs in walkways, pet beds, electrical cords, etc?   yes      Grab bars in the bathroom? yes      Handrails on the stairs?   yes      Adequate lighting?   yes  Depression Screen PHQ 2/9 Scores 10/29/2019 04/22/2019 10/22/2018 12/04/2017  PHQ - 2 Score 0 0 0 0  PHQ- 9 Score - - - -     Cognitive Function MMSE - Mini Mental State Exam 10/22/2018  Orientation to time 5  Orientation to Place 5  Registration 3  Attention/ Calculation 5  Recall 3  Language- name 2 objects 2  Language- repeat 1  Language- follow 3 step command 3  Language- read &  follow direction 1  Write a sentence 1  Copy design 1  Total score 30     6CIT Screen 10/29/2019  What Year? 0 points  What month? 0 points  What time? 0 points  Count back from 20 0 points  Months in reverse 0 points  Repeat phrase 0 points  Total Score 0    Immunization History  Administered Date(s) Administered  . Influenza Split 06/24/2013  . Influenza, High Dose Seasonal PF 05/23/2016, 06/28/2017, 06/12/2018, 05/17/2019  . Influenza, Seasonal, Injecte, Preservative Fre 05/27/2014, 07/11/2015  . Pneumococcal Conjugate-13 10/29/2016  . Pneumococcal Polysaccharide-23 07/25/2012, 11/06/2017, 05/17/2019  . Tdap 09/24/2010, 12/19/2015  . Zoster 09/25/2011  . Zoster Recombinat (Shingrix) 08/09/2017, 11/06/2017    Qualifies for Shingles Vaccine?Shingrix completed   Screening Tests Health Maintenance  Topic Date Due  . MAMMOGRAM  11/26/2019  . DEXA SCAN  11/25/2020  . TETANUS/TDAP  12/18/2025  . COLONOSCOPY  08/03/2026  . INFLUENZA VACCINE  Completed  . Hepatitis C Screening  Completed  . PNA vac Low Risk Adult  Completed    Cancer Screenings: Lung: Low Dose CT Chest recommended if Age 33-80 years, 30 pack-year currently smoking OR have quit w/in 15years. Patient does not qualify. Breast:  Up to date on Mammogram? Yes   Up to date of Bone Density/Dexa? Yes Colorectal: colonoscopy 08/03/16; repeat in 10 years   Plan:  I have personally reviewed and addressed the Medicare Annual Wellness questionnaire and have noted the following in the patient's chart:  A. Medical and social history B. Use of alcohol, tobacco or illicit drugs  C. Current medications and supplements D. Functional ability and status E.  Nutritional status F.  Physical activity G. Advance directives H. List of other physicians I.  Hospitalizations, surgeries, and ER visits in previous 12 months J.  [place] such as hearing and vision if needed, cognitive and depression L. Referrals,  records requested, and appointments- none   In addition, I have reviewed and discussed with patient certain preventive protocols, quality metrics, and best practice recommendations. A written personalized care plan for preventive services as well as general preventive health recommendations were provided to patient.   Signed,  Denman George, LPN  Nurse Health Advisor   Nurse Notes: no additional

## 2019-10-29 NOTE — Telephone Encounter (Signed)
Ok to place referral? Please advise. Thanks!  

## 2019-10-30 ENCOUNTER — Other Ambulatory Visit: Payer: Self-pay

## 2019-10-30 ENCOUNTER — Ambulatory Visit: Payer: Medicare Other | Attending: Internal Medicine

## 2019-10-30 DIAGNOSIS — Z23 Encounter for immunization: Secondary | ICD-10-CM | POA: Insufficient documentation

## 2019-10-30 NOTE — Telephone Encounter (Signed)
Patient notified via voicemail.

## 2019-10-30 NOTE — Progress Notes (Signed)
   Covid-19 Vaccination Clinic  Name:  Erin West    MRN: [id number] DOB: [birth year]  10/30/2019  Erin West was observed post Covid-19 immunization for 30 minutes based on pre-vaccination screening without incidence. She was provided with Vaccine Information Sheet and instruction to access the V-Safe system.   Erin West was instructed to call 911 with any severe reactions post vaccine: Marland Kitchen Difficulty breathing  . Swelling of your face and throat  . A fast heartbeat  . A bad rash all over your body  . Dizziness and weakness    Immunizations Administered    Name Date Dose VIS Date Route   Pfizer COVID-19 Vaccine 10/30/2019 11:25 AM 0.3 mL 09/04/2019 Intramuscular   Manufacturer: [place]   Lot: [id number]   [place]: [id number]

## 2019-10-30 NOTE — Telephone Encounter (Signed)
LVM for patient to return call. 

## 2019-10-30 NOTE — Telephone Encounter (Signed)
Patient is still wanting a referral or would be happy to have you check out her symptoms because she is having further issues and would like a full pelvic exam. She is still experiencing pelvic pain.

## 2019-10-30 NOTE — Telephone Encounter (Signed)
We will see her in the office as scheduled.  thanks

## 2019-11-03 ENCOUNTER — Telehealth: Payer: Self-pay

## 2019-11-03 NOTE — Telephone Encounter (Signed)
Patient calling to check status of referral .. please return call.

## 2019-11-03 NOTE — Telephone Encounter (Signed)
Patient notified that no referral has been placed. No GYN is needed per Dr. Jonni Sanger. Dr. Jonni Sanger is going to do everything if office.

## 2019-11-10 DIAGNOSIS — L821 Other seborrheic keratosis: Secondary | ICD-10-CM | POA: Diagnosis not present

## 2019-11-10 DIAGNOSIS — L989 Disorder of the skin and subcutaneous tissue, unspecified: Secondary | ICD-10-CM | POA: Diagnosis not present

## 2019-11-13 ENCOUNTER — Other Ambulatory Visit: Payer: Self-pay

## 2019-11-13 ENCOUNTER — Ambulatory Visit: Payer: Medicare Other

## 2019-11-13 ENCOUNTER — Telehealth: Payer: Self-pay | Admitting: Family Medicine

## 2019-11-13 DIAGNOSIS — R102 Pelvic and perineal pain: Secondary | ICD-10-CM

## 2019-11-13 DIAGNOSIS — N9089 Other specified noninflammatory disorders of vulva and perineum: Secondary | ICD-10-CM

## 2019-11-13 NOTE — Telephone Encounter (Signed)
She has seen Dr. Webb Silversmith in past for her pelvic pain. She can return to see her again OR can refer to new GYN if she prefers. Thanks.  Can cancel her appt with me for biopsy. thanks

## 2019-11-13 NOTE — Telephone Encounter (Signed)
Please advise 

## 2019-11-13 NOTE — Telephone Encounter (Signed)
Referral placed. Appt with Dr. Jonni Sanger cancelled.

## 2019-11-13 NOTE — Telephone Encounter (Signed)
Pt called requesting to be referred to a gynecologist. Pt is experiencing some symptoms and feels like she needs to be examined by a specialist. Please advise.

## 2019-11-14 ENCOUNTER — Other Ambulatory Visit: Payer: Self-pay | Admitting: Family Medicine

## 2019-11-16 ENCOUNTER — Ambulatory Visit: Payer: Medicare Other

## 2019-11-17 ENCOUNTER — Ambulatory Visit: Payer: Medicare Other | Admitting: Family Medicine

## 2019-11-18 ENCOUNTER — Other Ambulatory Visit: Payer: Self-pay | Admitting: Family Medicine

## 2019-11-18 DIAGNOSIS — N3941 Urge incontinence: Secondary | ICD-10-CM | POA: Diagnosis not present

## 2019-11-23 ENCOUNTER — Other Ambulatory Visit: Payer: Self-pay

## 2019-11-23 ENCOUNTER — Ambulatory Visit (INDEPENDENT_AMBULATORY_CARE_PROVIDER_SITE_OTHER): Payer: Medicare Other | Admitting: Obstetrics and Gynecology

## 2019-11-23 ENCOUNTER — Encounter: Payer: Self-pay | Admitting: Obstetrics and Gynecology

## 2019-11-23 VITALS — BP 136/80 | HR 76 | Temp 97.0°F | Resp 14 | Ht 62.0 in | Wt 192.8 lb

## 2019-11-23 DIAGNOSIS — K5900 Constipation, unspecified: Secondary | ICD-10-CM

## 2019-11-23 DIAGNOSIS — R102 Pelvic and perineal pain: Secondary | ICD-10-CM

## 2019-11-23 NOTE — Progress Notes (Signed)
GYNECOLOGY  VISIT   HPI: 69 y.o.   Married  Caucasian  female   G3P3 with Patient's last menstrual period was 09/24/2012 (approximate).   here for vaginal pain for 3-4 months. She also has brownish lesion on right vulva. She saw urologist last week and he asked her to see dermatologist or gynecologist regarding lesion.    States she was prescribed some vaginal estrogen cream years ago for dryness.   States the vagina is uncomfortable spontaneously.  Can hurt off and on.  Denies vaginal odor, drainage or itching.   States she is having constipation off and on.  She received Rx for Linzess and this causes cramping.  Exercising and trying to drink water.   She has a discoloration of the vulva, noted by the urologist at a recent visit.  She is being treated for overactive bladder.   She does have a dermatologist and she had the area examined at [facility].  She has follow up there in three months.   GYNECOLOGIC HISTORY: Patient's last menstrual period was 09/24/2012 (approximate). Contraception:  Tubal Menopausal hormone therapy:  Estrogen cream Last mammogram: 11-26-18 3D/Neg/density B/BiRads1 Last pap smear:  10-15-15  Neg:Neg HR HPV. Hx of cryotherapy to cervix in her 20's--normal paps since.        OB History    Gravida  3   Para  3   Term      Preterm      AB      Living  3     SAB      TAB      Ectopic      Multiple      Live Births                 Patient Active Problem List   Diagnosis Date Noted  . OAB (overactive bladder) 10/28/2019  . Atrophic vaginitis 10/22/2018  . Mixed hyperlipidemia 12/04/2017  . Chronic seasonal allergic rhinitis 12/04/2017  . Chronic pansinusitis 09/09/2017  . Laryngopharyngeal reflux (LPR) 09/09/2017  . Chronic cough 07/24/2017  . Status post total right knee replacement 01/24/2016  . Bilateral leg edema 03/30/2014  . History of gastritis 02/10/2014  . Dysphagia 01/28/2014  . Irritable bowel syndrome (IBS)  09/08/2013  . Osteopenia 07/14/2013  . Osteoarthritis, multiple sites 07/14/2013  . Essential hypertension   . Gastroesophageal reflux disease without esophagitis     Past Medical History:  Diagnosis Date  . Anxiety   . Arthritis   . Aspiration pneumonia ([place]) 2013  . Atypical chest pain    Stress test 04/21/10 - post-stress EF=89%. Normal scan.  . Diarrhea   . Diastolic dysfunction    But normal LV function on ECHO 2011 and low risk Myoview in 2011  . DVT (deep venous thrombosis) ([place])   . Dyslipidemia   . GERD (gastroesophageal reflux disease)   . Hypertension   . Osteoarthritis   . Osteopenia   . Palpitations    Bi[employer]atch MCT Monitor 04/16/10-04/22/10   . Syncope    Pain-mediated syncope    Past Surgical History:  Procedure Laterality Date  . CHOLECYSTECTOMY  2006  . KNEE ARTHROSCOPY Right   . REPLACEMENT TOTAL KNEE Left    left  . REPLACEMENT TOTAL KNEE Right   . TUBAL LIGATION  06/1978    Current Outpatient Medications  Medication Sig Dispense Refill  . acetaminophen (TYLENOL) 650 MG CR tablet Take 650 mg by mouth every 8 (eight) hours as needed for pain.    Marland Kitchen  albuterol (PROVENTIL HFA;VENTOLIN HFA) 108 (90 Base) MCG/ACT inhaler Inhale 2 puffs into the lungs every 4 (four) hours as needed for wheezing or shortness of breath. 1 Inhaler 2  . albuterol (PROVENTIL) (2.5 MG/3ML) 0.083% nebulizer solution USE 1 VIAL VIA NEBULIZER EVERY 6 HOURS AS NEEDED FOR WHEEZING OR SHORTNESS OF BREATH (Patient taking differently: Take 2.5 mg by nebulization every 4 (four) hours as needed for wheezing. ) 75 mL 1  . atorvastatin (LIPITOR) 20 MG tablet TAKE 1 TABLET DAILY 90 tablet 3  . azelastine (ASTELIN) 0.1 % nasal spray Place 2 sprays into both nostrils 2 (two) times daily. Use in each nostril as directed (Patient taking differently: Place 2 sprays into both nostrils 2 (two) times daily as needed for rhinitis. Use in each nostril as directed) 30 mL 12  . busPIRone (BUSPAR) 7.5 MG tablet  Take 1 tablet (7.5 mg total) by mouth 3 (three) times daily as needed (anxiety). 180 tablet 3  . carvedilol (COREG) 25 MG tablet Take 0.5 tablets (12.5 mg total) by mouth 2 (two) times daily. 180 tablet 3  . cetirizine (ZYRTEC ALLERGY) 10 MG tablet Take 1 tablet (10 mg total) by mouth daily. (Patient taking differently: Take 10 mg by mouth daily as needed for allergies. ) 90 tablet 3  . Cholecalciferol (VITAMIN D) 2000 UNITS CAPS Take 2,000 Units by mouth daily.     . diclofenac (VOLTAREN) 75 MG EC tablet Take 1 tablet (75 mg total) by mouth 2 (two) times daily. 30 tablet 2  . ESTRACE VAGINAL 0.1 MG/GM vaginal cream Place 1 Applicatorful vaginally 3 (three) times a week.     . losartan (COZAAR) 50 MG tablet AT THE START OF THERAPY TAKE ONE-HALF (1/2) TABLET (25 MG) DAILY FOR 14 DAYS THEN 1 TABLET DAILY THEREAFTER 90 tablet 3  . Multiple Vitamin (MULTIVITAMIN) tablet Take 1 tablet by mouth daily.    Marland Kitchen omeprazole (PRILOSEC) 20 MG capsule Take 1 capsule (20 mg total) by mouth 2 (two) times daily. 180 capsule 3  . phenazopyridine (PYRIDIUM) 100 MG tablet Take 100 mg by mouth 3 (three) times daily as needed for pain.    . polyethylene glycol powder (GLYCOLAX/MIRALAX) powder Take 17 g by mouth daily as needed. (Patient taking differently: Take 17 g by mouth daily as needed for mild constipation. ) 3350 g 1  . Probiotic Product (PROBIOTIC PO) Take 1 capsule by mouth daily.     . solifenacin (VESICARE) 5 MG tablet Take 5 mg by mouth daily.     No current facility-administered medications for this visit.     ALLERGIES: Penicillins, Sulfa antibiotics, Sulfasalazine, Tramadol, Ciprofloxacin, Trimethoprim, and Isovue [iopamidol]  Family History  Problem Relation Age of Onset  . Heart disease Mother   . Other Mother        leg amputation  . Arthritis Mother   . Osteoporosis Mother   . Mental illness Father   . Sudden death Father   . Arthritis Father   . Thyroid disease Father   . Stroke Paternal  Grandfather     Social History   Socioeconomic History  . Marital status: Married    Spouse name: Not on file  . Number of children: 3  . Years of education: Not on file  . Highest education level: Not on file  Occupational History  . Not on file  Tobacco Use  . Smoking status: Never Smoker  . Smokeless tobacco: Never Used  Substance and Sexual Activity  . Alcohol use:  Yes    Alcohol/week: 0.0 standard drinks    Comment: 2-3/wine per month  . Drug use: No  . Sexual activity: Never  Other Topics Concern  . Not on file  Social History Narrative  . Not on file   Social Determinants of Health   Financial Resource Strain:   . Difficulty of Paying Living Expenses: Not on file  Food Insecurity:   . Worried About Charity fundraiser in the Last Year: Not on file  . Ran Out of Food in the Last Year: Not on file  Transportation Needs:   . Lack of Transportation (Medical): Not on file  . Lack of Transportation (Non-Medical): Not on file  Physical Activity:   . Days of Exercise per Week: Not on file  . Minutes of Exercise per Session: Not on file  Stress:   . Feeling of Stress : Not on file  Social Connections:   . Frequency of Communication with Friends and Family: Not on file  . Frequency of Social Gatherings with Friends and Family: Not on file  . Attends Religious Services: Not on file  . Active Member of Clubs or Organizations: Not on file  . Attends Archivist Meetings: Not on file  . Marital Status: Not on file  Intimate Partner Violence:   . Fear of Current or Ex-Partner: Not on file  . Emotionally Abused: Not on file  . Physically Abused: Not on file  . Sexually Abused: Not on file    Review of Systems  All other systems reviewed and are negative.   PHYSICAL EXAMINATION:    BP 136/80 (Cuff Size: Large)   Pulse 76   Temp (!) 97 F (36.1 C) (Temporal)   Resp 14   Ht 5\' 2"  (1.575 m)   Wt 192 lb 12.8 oz (87.5 kg)   LMP 09/24/2012 (Approximate)    BMI 35.26 kg/m     General appearance: alert, cooperative and appears stated age Head: Normocephalic, without obvious abnormality, atraumatic Neck: no adenopathy, supple, symmetrical, trachea midline and thyroid normal to inspection and palpation Lungs: clear to auscultation bilaterally Heart: regular rate and rhythm Abdomen: soft, non-tender, no masses,  no organomegaly Extremities: extremities normal, atraumatic, no cyanosis or edema Skin: Skin color, texture, turgor normal. No rashes or lesions No abnormal inguinal nodes palpated Neurologic: Grossly normal  Pelvic: External genitalia:  Multiple pigmented areas of right labia majora and one area of right labia minora.              Urethra:  normal appearing urethra with no masses, tenderness or lesions              Bartholins and Skenes: normal                 Vagina: normal appearing vagina with normal color and discharge, no lesions              Cervix: no lesions                Bimanual Exam:  Uterus:  normal size, contour, position, consistency, mobility, non-tender              Adnexa: no mass, fullness, tenderness              Rectal exam: Yes.  .  Confirms.              Anus:  normal sphincter tone, no lesions  Chaperone was present for exam.  ASSESSMENT  Vaginal  pain of undetermined etiology.  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    On vaginal estrogen cream.   PLAN  Affirm.  We dicussed possible causes of vaginal pain.  Vaginal hydration products reviewed - water based, cooking oils, vitamin E.  I recommend she does not use vaginal estrogen due to her hx of DVT.  Return for pelvic US.  Try Miralax and Colace.    An After Visit Summary was printed and given to the patient.  ___30______ minutes face to face time of which over 50% was spent in counseling.

## 2019-11-23 NOTE — Patient Instructions (Signed)

## 2019-11-24 ENCOUNTER — Ambulatory Visit: Payer: Medicare Other | Attending: Internal Medicine

## 2019-11-24 ENCOUNTER — Other Ambulatory Visit: Payer: Self-pay

## 2019-11-24 DIAGNOSIS — Z23 Encounter for immunization: Secondary | ICD-10-CM | POA: Insufficient documentation

## 2019-11-24 DIAGNOSIS — B9689 Other specified bacterial agents as the cause of diseases classified elsewhere: Secondary | ICD-10-CM

## 2019-11-24 LAB — VAGINITIS/VAGINOSIS, DNA PROBE
Candida Species: NEGATIVE
Gardnerella vaginalis: POSITIVE — AB
Trichomonas vaginosis: NEGATIVE

## 2019-11-24 MED ORDER — METRONIDAZOLE 500 MG PO TABS: 500.0000 mg | ORAL_TABLET | Freq: Two times a day (BID) | ORAL | 0 refills | Status: DC

## 2019-11-24 NOTE — Progress Notes (Signed)
   Covid-19 Vaccination Clinic  Name:  Erin West    MRN: [id number] DOB: 12/15/1950  11/24/2019  [name] was observed post Covid-19 immunization for 15 minutes without incident. She was provided with Vaccine Information Sheet and instruction to access the V-Safe system.   [name] was instructed to call 911 with any severe reactions post vaccine: Marland Kitchen Difficulty breathing  . Swelling of face and throat  . A fast heartbeat  . A bad rash all over body  . Dizziness and weakness   Immunizations Administered    Name Date Dose VIS Date Route   Pfizer COVID-19 Vaccine 11/24/2019 12:06 PM 0.3 mL 09/04/2019 Intramuscular   Manufacturer: [place]   Lot: [id number]   [place]: [id number]

## 2019-11-24 NOTE — Progress Notes (Signed)
Flagyl Rx sent to pharmacy on file per Dr Elza Rafter orders from lab results on 11/24/2019.

## 2019-11-26 ENCOUNTER — Telehealth: Payer: Self-pay | Admitting: Obstetrics and Gynecology

## 2019-11-26 NOTE — Telephone Encounter (Signed)
Call to patient. Per DPR, OK to leave message on voicemail.   Left voicemail requesting a return call to [facility] to review benefits for recommended Pelvic ultrasound with Brook A. Quincy Simmonds, MD, Cherlynn June

## 2019-11-30 NOTE — Telephone Encounter (Signed)
Spoke with patient regarding benefits for recommended ultrasound. Patient is aware that ultrasound is transvaginal. Patient acknowledges understanding of information presented. Patient is aware of cancellation policy. Encounter closed. °

## 2019-12-09 DIAGNOSIS — Z1231 Encounter for screening mammogram for malignant neoplasm of breast: Secondary | ICD-10-CM | POA: Diagnosis not present

## 2019-12-09 LAB — HM MAMMOGRAPHY

## 2019-12-10 ENCOUNTER — Encounter: Payer: Self-pay | Admitting: Obstetrics and Gynecology

## 2019-12-10 ENCOUNTER — Other Ambulatory Visit: Payer: Self-pay

## 2019-12-10 ENCOUNTER — Ambulatory Visit (INDEPENDENT_AMBULATORY_CARE_PROVIDER_SITE_OTHER): Payer: Medicare Other

## 2019-12-10 ENCOUNTER — Ambulatory Visit (INDEPENDENT_AMBULATORY_CARE_PROVIDER_SITE_OTHER): Payer: Medicare Other | Admitting: Obstetrics and Gynecology

## 2019-12-10 VITALS — BP 148/82 | HR 70 | Temp 97.1°F | Ht 62.0 in | Wt 192.0 lb

## 2019-12-10 DIAGNOSIS — R102 Pelvic and perineal pain: Secondary | ICD-10-CM | POA: Diagnosis not present

## 2019-12-10 DIAGNOSIS — N760 Acute vaginitis: Secondary | ICD-10-CM

## 2019-12-10 DIAGNOSIS — Z719 Counseling, unspecified: Secondary | ICD-10-CM

## 2019-12-10 DIAGNOSIS — B9689 Other specified bacterial agents as the cause of diseases classified elsewhere: Secondary | ICD-10-CM

## 2019-12-10 NOTE — Progress Notes (Signed)
GYNECOLOGY  VISIT   HPI: 69 y.o.   Married  Caucasian  female   G3P3 with Patient's last menstrual period was 09/24/2012 (approximate).   here for pelvic ultrasound for vaginal pain of 3 - 4 month duration.   She was diagnosed and treated for bacterial vaginosis.  She took a course of Flagyl.   She states that her vaginal pain has improved.   She stopped the vaginal estrogen upon my recommendation due to her DVT.  She is now using Replens.   She has intermittent constipation.   She is asking about frequency of pap smears.   Patient just had her routine exam with Dr. Jonni Sanger.   GYNECOLOGIC HISTORY: Patient's last menstrual period was 09/24/2012 (approximate). Contraception: Tubal Menopausal hormone therapy: none Last mammogram:  11-26-18 3D/Neg/density B/BiRads1 Last pap smear:  10-15-15  Neg:Neg HR HPV. Hx of cryotherapy to cervix in her 20's--normal paps since.        OB History    Gravida  3   Para  3   Term      Preterm      AB      Living  3     SAB      TAB      Ectopic      Multiple      Live Births                 Patient Active Problem List   Diagnosis Date Noted  . OAB (overactive bladder) 10/28/2019  . Atrophic vaginitis 10/22/2018  . Mixed hyperlipidemia 12/04/2017  . Chronic seasonal allergic rhinitis 12/04/2017  . Chronic pansinusitis 09/09/2017  . Laryngopharyngeal reflux (LPR) 09/09/2017  . Chronic cough 07/24/2017  . Status post total right knee replacement 01/24/2016  . Bilateral leg edema 03/30/2014  . History of gastritis 02/10/2014  . Dysphagia 01/28/2014  . Irritable bowel syndrome (IBS) 09/08/2013  . Osteopenia 07/14/2013  . Osteoarthritis, multiple sites 07/14/2013  . Essential hypertension   . Gastroesophageal reflux disease without esophagitis     Past Medical History:  Diagnosis Date  . Anxiety   . Arthritis   . Aspiration pneumonia ([place]) 2013  . Atypical chest pain    Stress test 04/21/10 - post-stress EF=89%.  Normal scan.  . Diarrhea   . Diastolic dysfunction    But normal LV function on ECHO 2011 and low risk Myoview in 2011  . DVT (deep venous thrombosis) ([place])   . Dyslipidemia   . GERD (gastroesophageal reflux disease)   . Hypertension   . Osteoarthritis   . Osteopenia   . Palpitations    Bi[employer]atch MCT Monitor 04/16/10-04/22/10   . Syncope    Pain-mediated syncope    Past Surgical History:  Procedure Laterality Date  . CHOLECYSTECTOMY  2006  . KNEE ARTHROSCOPY Right   . REPLACEMENT TOTAL KNEE Left    left  . REPLACEMENT TOTAL KNEE Right   . TUBAL LIGATION  06/1978    Current Outpatient Medications  Medication Sig Dispense Refill  . acetaminophen (TYLENOL) 650 MG CR tablet Take 650 mg by mouth every 8 (eight) hours as needed for pain.    Marland Kitchen albuterol (PROVENTIL HFA;VENTOLIN HFA) 108 (90 Base) MCG/ACT inhaler Inhale 2 puffs into the lungs every 4 (four) hours as needed for wheezing or shortness of breath. 1 Inhaler 2  . albuterol (PROVENTIL) (2.5 MG/3ML) 0.083% nebulizer solution USE 1 VIAL VIA NEBULIZER EVERY 6 HOURS AS NEEDED FOR WHEEZING OR SHORTNESS OF BREATH (Patient taking  differently: Take 2.5 mg by nebulization every 4 (four) hours as needed for wheezing. ) 75 mL 1  . atorvastatin (LIPITOR) 20 MG tablet TAKE 1 TABLET DAILY 90 tablet 3  . azelastine (ASTELIN) 0.1 % nasal spray Place 2 sprays into both nostrils 2 (two) times daily. Use in each nostril as directed (Patient taking differently: Place 2 sprays into both nostrils 2 (two) times daily as needed for rhinitis. Use in each nostril as directed) 30 mL 12  . busPIRone (BUSPAR) 7.5 MG tablet Take 1 tablet (7.5 mg total) by mouth 3 (three) times daily as needed (anxiety). 180 tablet 3  . carvedilol (COREG) 25 MG tablet Take 0.5 tablets (12.5 mg total) by mouth 2 (two) times daily. 180 tablet 3  . cetirizine (ZYRTEC ALLERGY) 10 MG tablet Take 1 tablet (10 mg total) by mouth daily. (Patient taking differently: Take 10 mg by mouth  daily as needed for allergies. ) 90 tablet 3  . Cholecalciferol (VITAMIN D) 2000 UNITS CAPS Take 2,000 Units by mouth daily.     . diclofenac (VOLTAREN) 75 MG EC tablet Take 1 tablet (75 mg total) by mouth 2 (two) times daily. 30 tablet 2  . losartan (COZAAR) 50 MG tablet AT THE START OF THERAPY TAKE ONE-HALF (1/2) TABLET (25 MG) DAILY FOR 14 DAYS THEN 1 TABLET DAILY THEREAFTER 90 tablet 3  . Multiple Vitamin (MULTIVITAMIN) tablet Take 1 tablet by mouth daily.    Marland Kitchen omeprazole (PRILOSEC) 20 MG capsule Take 1 capsule (20 mg total) by mouth 2 (two) times daily. 180 capsule 3  . polyethylene glycol powder (GLYCOLAX/MIRALAX) powder Take 17 g by mouth daily as needed. (Patient taking differently: Take 17 g by mouth daily as needed for mild constipation. ) 3350 g 1  . Probiotic Product (PROBIOTIC PO) Take 1 capsule by mouth daily.     . solifenacin (VESICARE) 5 MG tablet Take 5 mg by mouth daily.     No current facility-administered medications for this visit.     ALLERGIES: Penicillins, Sulfa antibiotics, Sulfasalazine, Tramadol, Ciprofloxacin, Trimethoprim, and Isovue [iopamidol]  Family History  Problem Relation Age of Onset  . Heart disease Mother   . Other Mother        leg amputation  . Arthritis Mother   . Osteoporosis Mother   . Mental illness Father   . Sudden death Father   . Arthritis Father   . Thyroid disease Father   . Stroke Paternal Grandfather     Social History   Socioeconomic History  . Marital status: Married    Spouse name: Not on file  . Number of children: 3  . Years of education: Not on file  . Highest education level: Not on file  Occupational History  . Not on file  Tobacco Use  . Smoking status: Never Smoker  . Smokeless tobacco: Never Used  Substance and Sexual Activity  . Alcohol use: Yes    Alcohol/week: 0.0 standard drinks    Comment: 2-3/wine per month  . Drug use: No  . Sexual activity: Never  Other Topics Concern  . Not on file  Social  History Narrative  . Not on file   Social Determinants of Health   Financial Resource Strain:   . Difficulty of Paying Living Expenses:   Food Insecurity:   . Worried About Charity fundraiser in the Last Year:   . [place] in the Last Year:   Transportation Needs:   . Lack of  Transportation (Medical):   Marland Kitchen Lack of Transportation (Non-Medical):   Physical Activity:   . Days of Exercise per Week:   . Minutes of Exercise per Session:   Stress:   . Feeling of Stress :   Social Connections:   . Frequency of Communication with Friends and Family:   . Frequency of Social Gatherings with Friends and Family:   . Attends Religious Services:   . Active Member of Clubs or Organizations:   . Attends Archivist Meetings:   Marland Kitchen Marital Status:   Intimate Partner Violence:   . Fear of Current or Ex-Partner:   . Emotionally Abused:   Marland Kitchen Physically Abused:   . Sexually Abused:     Review of Systems  All other systems reviewed and are negative.   PHYSICAL EXAMINATION:    BP (!) 148/82 (Cuff Size: Large)   Pulse 70   Temp (!) 97.1 F (36.2 C) (Temporal)   Ht 5\' 2"  (1.575 m)   Wt 192 lb (87.1 kg)   LMP 09/24/2012 (Approximate)   BMI 35.12 kg/m     General appearance: alert, cooperative and appears stated age  Pelvic US  Uterus normal.  EMS 3.10 mm.  Small left ovarian follicle.  Right ovary normal.  No free fluid.   ASSESSMENT  Vaginal pain. Improved. Normal pelvic US.  Recent BV.   Vaginal atrophy. Hx DVT.   Health education counseling.   PLAN  Normal pelvic US findings discussed and reassurance given.  We reviewed bacterial vaginosis.  I recommend against douching and feminine cleansing products for the vagina.  Vaginal atrophy discussed including treatment options - water based, cooking oil, and vit E suppositories.  Cervical cancer screening reviewed.  She will return for recurrent vaginal pain.    An After Visit Summary was printed and given to  the patient.  _15_____ minutes face to face time of which over 50% was spent in counseling.

## 2019-12-22 ENCOUNTER — Encounter: Payer: Self-pay | Admitting: Family Medicine

## 2020-01-20 ENCOUNTER — Other Ambulatory Visit: Payer: Self-pay | Admitting: Family Medicine

## 2020-01-26 ENCOUNTER — Other Ambulatory Visit: Payer: Self-pay

## 2020-01-26 ENCOUNTER — Telehealth: Payer: Self-pay

## 2020-01-26 ENCOUNTER — Telehealth: Payer: Self-pay | Admitting: Family Medicine

## 2020-01-26 MED ORDER — CARVEDILOL 25 MG PO TABS: 12.5000 mg | ORAL_TABLET | Freq: Two times a day (BID) | ORAL | 3 refills | Status: DC

## 2020-01-26 MED ORDER — CARVEDILOL 25 MG PO TABS: 12.5000 mg | ORAL_TABLET | Freq: Two times a day (BID) | ORAL | 0 refills | Status: DC

## 2020-01-26 NOTE — Telephone Encounter (Signed)
Medication has been sent to the pharmacy. 

## 2020-01-26 NOTE — Telephone Encounter (Signed)
Pt called stating [facility] does not have her prescription. Please advise.

## 2020-01-26 NOTE — Telephone Encounter (Signed)
MEDICATION: Carvedilol 25 MG  PHARMACY: [facility] [place] [facility]  Comments:  Pt asking for Dr. Jonni Sanger to send a script of a weeks worth of medicine [facility] and [facility]. Please advise  **Let patient know to contact pharmacy at the end of the day to make sure medication is ready. **  ** Please notify patient to allow 48-72 hours to process**  **Encourage patient to contact the pharmacy for refills or they can request refills through [facility]**

## 2020-01-26 NOTE — Telephone Encounter (Signed)
Scripts sent to both pharmacy

## 2020-01-26 NOTE — Telephone Encounter (Signed)
Medication has been sent to the patient's pharmacy.  

## 2020-01-27 ENCOUNTER — Other Ambulatory Visit: Payer: Self-pay

## 2020-01-27 MED ORDER — CARVEDILOL 25 MG PO TABS: 12.5000 mg | ORAL_TABLET | Freq: Two times a day (BID) | ORAL | 0 refills | Status: DC

## 2020-01-27 NOTE — Telephone Encounter (Signed)
Patient is calling in this morning stating she contacted the pharmacy last night and they told patient that they have yet to receive it.

## 2020-01-27 NOTE — Telephone Encounter (Signed)
I have resent electronically and will fax to pharmacy

## 2020-01-28 ENCOUNTER — Other Ambulatory Visit: Payer: Self-pay

## 2020-01-28 ENCOUNTER — Telehealth: Payer: Self-pay

## 2020-01-28 DIAGNOSIS — M21612 Bunion of left foot: Secondary | ICD-10-CM | POA: Diagnosis not present

## 2020-01-28 DIAGNOSIS — M71572 Other bursitis, not elsewhere classified, left ankle and foot: Secondary | ICD-10-CM | POA: Diagnosis not present

## 2020-01-28 DIAGNOSIS — M722 Plantar fascial fibromatosis: Secondary | ICD-10-CM | POA: Diagnosis not present

## 2020-01-28 DIAGNOSIS — M21611 Bunion of right foot: Secondary | ICD-10-CM | POA: Diagnosis not present

## 2020-01-28 DIAGNOSIS — L600 Ingrowing nail: Secondary | ICD-10-CM | POA: Diagnosis not present

## 2020-01-28 MED ORDER — CARVEDILOL 25 MG PO TABS: 12.5000 mg | ORAL_TABLET | Freq: Two times a day (BID) | ORAL | 3 refills | Status: DC

## 2020-01-28 NOTE — Telephone Encounter (Signed)
Patient states that she was previously taking one tablet instead of half a tablet. Patient would like for Dr. Jonni Sanger to correct this and send future refills to  [place], [place] Phone:  [phone]  Fax:  [phone]

## 2020-01-28 NOTE — Telephone Encounter (Signed)
Script sent to pharmacy.

## 2020-02-03 ENCOUNTER — Telehealth: Payer: Self-pay | Admitting: Family Medicine

## 2020-02-03 NOTE — Telephone Encounter (Signed)
Patient is calling in this morning stating she received the carvedilol (COREG) 25 MG tablet but it was for 0.5 tablet instead of 1 tablet -twice daily- would like to know if Dr.Andy could fix this, states she has always done a whole pill, and does not know why it is for half.

## 2020-02-04 NOTE — Telephone Encounter (Signed)
By chart review, pt has been on 1/2 25mg  carvedilol for over a year now by our records. Reviewed Dr. Jacalyn Lefevre notes as well, believe he tried to increase dose but she had problems with it.   Will have to contact pharmacy and see what she has been getting and taking, then refill accordingly.   Thanks.

## 2020-02-09 ENCOUNTER — Telehealth: Payer: Self-pay | Admitting: Cardiology

## 2020-02-09 ENCOUNTER — Other Ambulatory Visit: Payer: Self-pay

## 2020-02-09 NOTE — Telephone Encounter (Signed)
Message sent to Dr. Jacalyn Lefevre office per Dr. Jonni Sanger for correct medicine dosage

## 2020-02-09 NOTE — Telephone Encounter (Signed)
Reviewed chart and looks as though Carvedilol is 25 mg 1/2 tablet twice a day Left message for patient to call back

## 2020-02-09 NOTE — Telephone Encounter (Signed)
New Message     Pt c/o medication issue:  1. Name of Medication: carvedilol (COREG) 25 MG tablet  2. How are you currently taking this medication (dosage and times per day)? 25 mg in morning and 25 mg at night   3. Are you having a reaction (difficulty breathing--STAT)? No   4. What is your medication issue? Valetta Fuller is calling from [place] and says the pt told her she is taking this mediation 25 mg in morning and 25 mg at night. She says she shows the pt is  Taking 12.5 mg in morning and 12.5g mg at night.    Please call

## 2020-02-11 NOTE — Telephone Encounter (Signed)
Lm to call back ./cy 

## 2020-02-12 ENCOUNTER — Ambulatory Visit: Payer: Medicare Other | Admitting: Family Medicine

## 2020-02-12 NOTE — Telephone Encounter (Signed)
Left message for pt to call.

## 2020-02-15 ENCOUNTER — Telehealth: Payer: Self-pay | Admitting: Family Medicine

## 2020-02-15 MED ORDER — CARVEDILOL 25 MG PO TABS: 25.0000 mg | ORAL_TABLET | Freq: Two times a day (BID) | ORAL | 3 refills | Status: DC

## 2020-02-15 NOTE — Telephone Encounter (Signed)
Returning your call. °

## 2020-02-15 NOTE — Telephone Encounter (Signed)
Left msg for Katy from [place] to call back to confirm patients dose of Carvedilol.

## 2020-02-15 NOTE — Telephone Encounter (Signed)
Left message for patient with Dr Creshaw's recommendations.   New script sent to the pharmacy  

## 2020-02-15 NOTE — Telephone Encounter (Signed)
If she is taking 25 bid and tolerating would continue [employer]

## 2020-02-15 NOTE — Telephone Encounter (Signed)
I spoke to the patient who said that she has been taking Carvedilol 25 mg bid for quite awhile and never knew to lessen to 12.5 mg bid.    She said her BP has been 130/70 with a HR in the mid 70s.  I told her that I would get Dr Jacalyn Lefevre advisement on this.  She verbalized understanding.

## 2020-02-15 NOTE — Addendum Note (Signed)
Addended by: Cristopher Estimable on: 02/15/2020 04:55 PM   Modules accepted: Orders

## 2020-02-16 ENCOUNTER — Other Ambulatory Visit: Payer: Self-pay | Admitting: Cardiology

## 2020-02-16 ENCOUNTER — Ambulatory Visit: Payer: Medicare Other | Admitting: Family Medicine

## 2020-02-16 DIAGNOSIS — M25511 Pain in right shoulder: Secondary | ICD-10-CM | POA: Diagnosis not present

## 2020-02-16 DIAGNOSIS — M25512 Pain in left shoulder: Secondary | ICD-10-CM | POA: Diagnosis not present

## 2020-02-16 NOTE — Telephone Encounter (Signed)
*  STAT* If patient is at the pharmacy, call can be transferred to refill team.   1. Which medications need to be refilled? (please list name of each medication and dose if known) carvedilol (COREG) 25 MG tablet  2. Which pharmacy/location (including street and city if local pharmacy) is medication to be sent to? [facility] [place], [place]  3. Do they need a 30 day or 90 day supply? 90 day  [facility]

## 2020-03-04 DIAGNOSIS — M25512 Pain in left shoulder: Secondary | ICD-10-CM | POA: Diagnosis not present

## 2020-03-04 DIAGNOSIS — M25511 Pain in right shoulder: Secondary | ICD-10-CM | POA: Diagnosis not present

## 2020-04-01 ENCOUNTER — Ambulatory Visit: Payer: Medicare Other | Admitting: Family Medicine

## 2020-04-14 DIAGNOSIS — M25512 Pain in left shoulder: Secondary | ICD-10-CM | POA: Diagnosis not present

## 2020-04-19 DIAGNOSIS — M19012 Primary osteoarthritis, left shoulder: Secondary | ICD-10-CM | POA: Diagnosis not present

## 2020-04-19 DIAGNOSIS — M25512 Pain in left shoulder: Secondary | ICD-10-CM | POA: Diagnosis not present

## 2020-04-19 DIAGNOSIS — M25511 Pain in right shoulder: Secondary | ICD-10-CM | POA: Diagnosis not present

## 2020-04-28 ENCOUNTER — Ambulatory Visit: Payer: Medicare Other | Admitting: Family Medicine

## 2020-05-06 DIAGNOSIS — M19012 Primary osteoarthritis, left shoulder: Secondary | ICD-10-CM | POA: Diagnosis not present

## 2020-05-06 DIAGNOSIS — M25512 Pain in left shoulder: Secondary | ICD-10-CM | POA: Diagnosis not present

## 2020-05-12 ENCOUNTER — Telehealth: Payer: Self-pay | Admitting: *Deleted

## 2020-05-12 NOTE — Telephone Encounter (Signed)
A message was left, re: her follow up visit. 

## 2020-05-19 ENCOUNTER — Ambulatory Visit (INDEPENDENT_AMBULATORY_CARE_PROVIDER_SITE_OTHER): Payer: Medicare Other | Admitting: Family Medicine

## 2020-05-19 ENCOUNTER — Encounter: Payer: Self-pay | Admitting: Family Medicine

## 2020-05-19 ENCOUNTER — Other Ambulatory Visit: Payer: Self-pay

## 2020-05-19 VITALS — BP 120/82 | HR 74 | Temp 97.4°F | Resp 15 | Ht 62.0 in | Wt 180.8 lb

## 2020-05-19 DIAGNOSIS — I10 Essential (primary) hypertension: Secondary | ICD-10-CM | POA: Diagnosis not present

## 2020-05-19 NOTE — Progress Notes (Signed)
Subjective  CC:  Chief Complaint  Patient presents with  . Hypertension    HPI: Erin West is a 69 y.o. female who presents to the office today to address the problems listed above in the chief complaint.  Hypertension f/u: Control is good . Pt reports she is doing well. taking medications as instructed, no medication side effects noted, no TIAs, no chest pain on exertion, no dyspnea on exertion, no swelling of ankles. She denies adverse effects from his BP medications. Compliance with medication is good.   Grief: husband was placed on hospice this week. She has good support.   Assessment  1. Essential hypertension      Plan    Hypertension f/u: BP control is well controlled. Continue current meds  Education regarding management of these chronic disease states was given. Management strategies discussed on successive visits include dietary and exercise recommendations, goals of achieving and maintaining IBW, and lifestyle modifications aiming for adequate sleep and minimizing stressors.   Follow up: 6 months for cpe  No orders of the defined types were placed in this encounter.  No orders of the defined types were placed in this encounter.     BP Readings from Last 3 Encounters:  05/19/20 120/82  12/10/19 (!) 148/82  11/23/19 136/80   Wt Readings from Last 3 Encounters:  05/19/20 180 lb 12.8 oz (82 kg)  12/10/19 192 lb (87.1 kg)  11/23/19 192 lb 12.8 oz (87.5 kg)    Lab Results  Component Value Date   CHOL 163 10/28/2019   CHOL 157 10/22/2018   CHOL 124 10/16/2017   Lab Results  Component Value Date   HDL 53.00 10/28/2019   HDL 51.80 10/22/2018   HDL 49 10/16/2017   Lab Results  Component Value Date   LDLCALC 94 10/28/2019   [place] 90 10/22/2018   LDLCALC 61 10/16/2017   Lab Results  Component Value Date   TRIG 80.0 10/28/2019   TRIG 73.0 10/22/2018   TRIG 69 10/16/2017   Lab Results  Component Value Date   CHOLHDL 3 10/28/2019   CHOLHDL 3  10/22/2018   No results found for: LDLDIRECT Lab Results  Component Value Date   CREATININE 0.85 10/28/2019   BUN 28 (H) 10/28/2019   NA 138 10/28/2019   K 4.3 10/28/2019   CL 102 10/28/2019   CO2 29 10/28/2019    The 10-year ASCVD risk score Mikey Bussing DC Jr., et al., 2013) is: 9.4%   Values used to calculate the score:     Age: 4 years     Sex: Female     Is Non-Hispanic African American: No     Diabetic: No     Tobacco smoker: No     Systolic Blood Pressure: 338 mmHg     Is BP treated: Yes     HDL Cholesterol: 53 mg/dL     Total Cholesterol: 163 mg/dL  I reviewed the patients updated PMH, FH, and SocHx.    Patient Active Problem List   Diagnosis Date Noted  . Mixed hyperlipidemia 12/04/2017    Priority: High  . Chronic cough 07/24/2017    Priority: High  . Essential hypertension     Priority: High  . OAB (overactive bladder) 10/28/2019    Priority: Medium  . Chronic seasonal allergic rhinitis 12/04/2017    Priority: Medium  . Chronic pansinusitis 09/09/2017    Priority: Medium  . Laryngopharyngeal reflux (LPR) 09/09/2017    Priority: Medium  . Bilateral leg  edema 03/30/2014    Priority: Medium  . History of gastritis 02/10/2014    Priority: Medium  . Dysphagia 01/28/2014    Priority: Medium  . Irritable bowel syndrome (IBS) 09/08/2013    Priority: Medium  . Osteopenia 07/14/2013    Priority: Medium  . Osteoarthritis, multiple sites 07/14/2013    Priority: Medium  . Gastroesophageal reflux disease without esophagitis     Priority: Medium  . Atrophic vaginitis 10/22/2018    Priority: Low  . Status post total right knee replacement 01/24/2016    Priority: Low    Allergies: Penicillins, Sulfa antibiotics, Sulfasalazine, Tramadol, Ciprofloxacin, Trimethoprim, and Isovue [iopamidol]  Social History: Patient  reports that she has never smoked. She has never used smokeless tobacco. She reports current alcohol use. She reports that she does not use  drugs.  Current Meds  Medication Sig  . acetaminophen (TYLENOL) 650 MG CR tablet Take 650 mg by mouth every 8 (eight) hours as needed for pain.  Marland Kitchen albuterol (PROVENTIL HFA;VENTOLIN HFA) 108 (90 Base) MCG/ACT inhaler Inhale 2 puffs into the lungs every 4 (four) hours as needed for wheezing or shortness of breath.  Marland Kitchen albuterol (PROVENTIL) (2.5 MG/3ML) 0.083% nebulizer solution USE 1 VIAL VIA NEBULIZER EVERY 6 HOURS AS NEEDED FOR WHEEZING OR SHORTNESS OF BREATH (Patient taking differently: Take 2.5 mg by nebulization every 4 (four) hours as needed for wheezing. )  . atorvastatin (LIPITOR) 20 MG tablet TAKE 1 TABLET DAILY  . azelastine (ASTELIN) 0.1 % nasal spray Place 2 sprays into both nostrils 2 (two) times daily. Use in each nostril as directed (Patient taking differently: Place 2 sprays into both nostrils 2 (two) times daily as needed for rhinitis. Use in each nostril as directed)  . busPIRone (BUSPAR) 7.5 MG tablet Take 1 tablet (7.5 mg total) by mouth 3 (three) times daily as needed (anxiety).  . carvedilol (COREG) 25 MG tablet Take 1 tablet (25 mg total) by mouth 2 (two) times daily.  . cetirizine (ZYRTEC ALLERGY) 10 MG tablet Take 1 tablet (10 mg total) by mouth daily. (Patient taking differently: Take 10 mg by mouth daily as needed for allergies. )  . Cholecalciferol (VITAMIN D) 2000 UNITS CAPS Take 2,000 Units by mouth daily.   . diclofenac (VOLTAREN) 75 MG EC tablet Take 1 tablet (75 mg total) by mouth 2 (two) times daily.  Marland Kitchen losartan (COZAAR) 50 MG tablet AT THE START OF THERAPY TAKE ONE-HALF (1/2) TABLET (25 MG) DAILY FOR 14 DAYS THEN 1 TABLET DAILY THEREAFTER  . Multiple Vitamin (MULTIVITAMIN) tablet Take 1 tablet by mouth daily.  Marland Kitchen omeprazole (PRILOSEC) 20 MG capsule TAKE 1 CAPSULE TWICE A DAY  . polyethylene glycol powder (GLYCOLAX/MIRALAX) powder Take 17 g by mouth daily as needed. (Patient taking differently: Take 17 g by mouth daily as needed for mild constipation. )  . Probiotic  Product (PROBIOTIC PO) Take 1 capsule by mouth daily.   . solifenacin (VESICARE) 5 MG tablet Take 5 mg by mouth daily.    Review of Systems: Cardiovascular: negative for chest pain, palpitations, leg swelling, orthopnea Respiratory: negative for SOB, wheezing or persistent cough Gastrointestinal: negative for abdominal pain Genitourinary: negative for dysuria or gross hematuria  Objective  Vitals: BP 120/82   Pulse 74   Temp (!) 97.4 F (36.3 C) (Temporal)   Resp 15   Ht 5\' 2"  (1.575 m)   Wt 180 lb 12.8 oz (82 kg)   LMP 09/24/2012 (Approximate)   SpO2 97%   BMI 33.07  kg/m  General: no acute distress  Psych:  Alert and oriented, normal mood and affect HEENT:  Normocephalic, atraumatic, supple neck  Cardiovascular:  RRR without murmur. no edema Respiratory:  Good breath sounds bilaterally, CTAB with normal respiratory effort   Commons side effects, risks, benefits, and alternatives for medications and treatment plan prescribed today were discussed, and the patient expressed understanding of the given instructions. Patient is instructed to call or message via MyChart if he/she has any questions or concerns regarding our treatment plan. No barriers to understanding were identified. We discussed Red Flag symptoms and signs in detail. Patient expressed understanding regarding what to do in case of urgent or emergency type symptoms.   Medication list was reconciled, printed and provided to the patient in AVS. Patient instructions and summary information was reviewed with the patient as documented in the AVS. This note was prepared with assistance of Dragon voice recognition software. Occasional wrong-word or sound-a-like substitutions may have occurred due to the inherent limitations of voice recognition software  This visit occurred during the SARS-CoV-2 public health emergency.  Safety protocols were in place, including screening questions prior to the visit, additional usage of staff  PPE, and extensive cleaning of exam room while observing appropriate contact time as indicated for disinfecting solutions.

## 2020-05-19 NOTE — Patient Instructions (Signed)
Please return in 6 months for your annual complete physical; please come fasting.  I'm so sorry to hear about your husband. Please call me if you need anything.   If you have any questions or concerns, please don't hesitate to send me a message via MyChart or call the office at [phone]. Thank you for visiting with [place] today! It's our pleasure caring for you.

## 2020-05-26 ENCOUNTER — Telehealth: Payer: Self-pay | Admitting: Family Medicine

## 2020-05-26 NOTE — Telephone Encounter (Signed)
Patient called stating that her husband has passed.  States Dr. Jonni Sanger is aware of this from her last OV.   Patient is requesting Dr. Jonni Sanger to send a script for something to assist her with sleeping to [facility] at [facility].   Please advise.  Also, patient states that at her last OV she was to have had a script for flonase to be sent [facility].  She has not received script.    Please advise.

## 2020-05-27 ENCOUNTER — Encounter: Payer: Self-pay | Admitting: Family Medicine

## 2020-05-27 MED ORDER — FLUTICASONE PROPIONATE 50 MCG/ACT NA SUSP
2.0000 | Freq: Every day | NASAL | 6 refills | Status: DC
Start: 2020-05-27 — End: 2021-05-31

## 2020-05-27 MED ORDER — ALPRAZOLAM 0.5 MG PO TABS
0.5000 mg | ORAL_TABLET | Freq: Every evening | ORAL | 0 refills | Status: DC | PRN
Start: 2020-05-27 — End: 2020-09-07

## 2020-05-27 NOTE — Telephone Encounter (Signed)
LVM letting the patient know that her refills have been sent to her pharmacy. Our condolences were expressed as well. Office number was provided in case she needs anything else.

## 2020-05-27 NOTE — Telephone Encounter (Signed)
Please call patient with our condolences; I have sent in xanax for stress/anxiety/sleep to be used as needed and sent in flonase aswell.  Let me know if there is anything we can do for her.  Thanks.

## 2020-06-09 DIAGNOSIS — N3941 Urge incontinence: Secondary | ICD-10-CM | POA: Diagnosis not present

## 2020-06-09 DIAGNOSIS — R350 Frequency of micturition: Secondary | ICD-10-CM | POA: Diagnosis not present

## 2020-06-22 DIAGNOSIS — Z23 Encounter for immunization: Secondary | ICD-10-CM | POA: Diagnosis not present

## 2020-06-28 ENCOUNTER — Telehealth: Payer: Self-pay | Admitting: Cardiology

## 2020-06-28 NOTE — Telephone Encounter (Signed)
Patient c/o Palpitations:  High priority if patient c/o lightheadedness, shortness of breath, or chest pain  1) How long have you had palpitations/irregular HR/ Afib? Are you having the symptoms now? Patient states she is having palpitations right now  2) Are you currently experiencing lightheadedness, SOB or CP? No   3) Do you have a history of afib (atrial fibrillation) or irregular heart rhythm? No   4) Have you checked your BP or HR? (document readings if available):  BP: 120/82 HR: 77 (estimate) 5) Are you experiencing any other symptoms? No

## 2020-06-28 NOTE — Telephone Encounter (Signed)
Called patient; left message to call back.

## 2020-06-30 NOTE — Telephone Encounter (Signed)
Attempted to call pt back again. Left vm to call us back if needed.

## 2020-07-04 NOTE — Telephone Encounter (Signed)
Unable to reach pt or leave a message mailbox is full 

## 2020-07-12 NOTE — Progress Notes (Signed)
HPI: FU palpitations. Nuclear study July 2011 showed normal LV function and normal perfusion. Event monitor July 2011 showed normal sinus rhythm. Echocardiogram September 2019 showed normal LV function, mild diastolic dysfunction.  Monitor October 2019 showed sinus rhythm with PVCs.  Since last seen,  there is no dyspnea, chest pain or syncope.  She has occasional palpitations that are described as a skip.  Current Outpatient Medications  Medication Sig Dispense Refill  . acetaminophen (TYLENOL) 650 MG CR tablet Take 650 mg by mouth every 8 (eight) hours as needed for pain.    Marland Kitchen albuterol (PROVENTIL HFA;VENTOLIN HFA) 108 (90 Base) MCG/ACT inhaler Inhale 2 puffs into the lungs every 4 (four) hours as needed for wheezing or shortness of breath. 1 Inhaler 2  . ALPRAZolam (XANAX) 0.5 MG tablet Take 1 tablet (0.5 mg total) by mouth at bedtime as needed for anxiety or sleep. 30 tablet 0  . atorvastatin (LIPITOR) 20 MG tablet TAKE 1 TABLET DAILY 90 tablet 3  . azelastine (ASTELIN) 0.1 % nasal spray Place 2 sprays into both nostrils 2 (two) times daily. Use in each nostril as directed (Patient taking differently: Place 2 sprays into both nostrils 2 (two) times daily as needed for rhinitis. Use in each nostril as directed) 30 mL 12  . busPIRone (BUSPAR) 7.5 MG tablet Take 1 tablet (7.5 mg total) by mouth 3 (three) times daily as needed (anxiety). 180 tablet 3  . carvedilol (COREG) 25 MG tablet Take 1 tablet (25 mg total) by mouth 2 (two) times daily. 180 tablet 3  . cetirizine (ZYRTEC ALLERGY) 10 MG tablet Take 1 tablet (10 mg total) by mouth daily. (Patient taking differently: Take 10 mg by mouth daily as needed for allergies. ) 90 tablet 3  . Cholecalciferol (VITAMIN D) 2000 UNITS CAPS Take 2,000 Units by mouth daily.     . fluticasone (FLONASE) 50 MCG/ACT nasal spray Place 2 sprays into both nostrils daily. 16 g 6  . losartan (COZAAR) 50 MG tablet AT THE START OF THERAPY TAKE ONE-HALF (1/2)  TABLET (25 MG) DAILY FOR 14 DAYS THEN 1 TABLET DAILY THEREAFTER 90 tablet 3  . Multiple Vitamin (MULTIVITAMIN) tablet Take 1 tablet by mouth daily.    Marland Kitchen omeprazole (PRILOSEC) 20 MG capsule TAKE 1 CAPSULE TWICE A DAY 180 capsule 3  . polyethylene glycol powder (GLYCOLAX/MIRALAX) powder Take 17 g by mouth daily as needed. (Patient taking differently: Take 17 g by mouth daily as needed for mild constipation. ) 3350 g 1  . Probiotic Product (PROBIOTIC PO) Take 1 capsule by mouth daily.     . solifenacin (VESICARE) 5 MG tablet Take 5 mg by mouth daily.     No current facility-administered medications for this visit.     Past Medical History:  Diagnosis Date  . Anxiety   . Arthritis   . Aspiration pneumonia ([place]) 2013  . Atypical chest pain    Stress test 04/21/10 - post-stress EF=89%. Normal scan.  . Diarrhea   . Diastolic dysfunction    But normal LV function on ECHO 2011 and low risk Myoview in 2011  . DVT (deep venous thrombosis) ([address])   . Dyslipidemia   . GERD (gastroesophageal reflux disease)   . Hypertension   . Osteoarthritis   . Osteopenia   . Palpitations    [employer] MCT Monitor 04/16/10-04/22/10   . Syncope    Pain-mediated syncope    Past Surgical History:  Procedure Laterality Date  . CHOLECYSTECTOMY  2006  . KNEE ARTHROSCOPY Right   . REPLACEMENT TOTAL KNEE Left    left  . REPLACEMENT TOTAL KNEE Right   . TUBAL LIGATION  06/1978    Social History   Socioeconomic History  . Marital status: Widowed    Spouse name: Not on file  . Number of children: 3  . Years of education: Not on file  . Highest education level: Not on file  Occupational History  . Not on file  Tobacco Use  . Smoking status: Never Smoker  . Smokeless tobacco: Never Used  Vaping Use  . Vaping Use: Never used  Substance and Sexual Activity  . Alcohol use: Yes    Alcohol/week: 0.0 standard drinks    Comment: 2-3/wine per month  . Drug use: No  . Sexual activity: Never  Other Topics  Concern  . Not on file  Social History Narrative  . Not on file   Social Determinants of Health   Financial Resource Strain:   . Difficulty of Paying Living Expenses: Not on file  Food Insecurity:   . Worried About Charity fundraiser in the Last Year: Not on file  . Ran Out of Food in the Last Year: Not on file  Transportation Needs:   . Lack of Transportation (Medical): Not on file  . Lack of Transportation (Non-Medical): Not on file  Physical Activity:   . Days of Exercise per Week: Not on file  . Minutes of Exercise per Session: Not on file  Stress:   . Feeling of Stress : Not on file  Social Connections:   . Frequency of Communication with Friends and Family: Not on file  . Frequency of Social Gatherings with Friends and Family: Not on file  . Attends Religious Services: Not on file  . Active Member of Clubs or Organizations: Not on file  . Attends Archivist Meetings: Not on file  . Marital Status: Not on file  Intimate Partner Violence:   . Fear of Current or Ex-Partner: Not on file  . Emotionally Abused: Not on file  . Physically Abused: Not on file  . Sexually Abused: Not on file    Family History  Problem Relation Age of Onset  . Heart disease Mother   . Other Mother        leg amputation  . Arthritis Mother   . Osteoporosis Mother   . Mental illness Father   . Sudden death Father   . Arthritis Father   . Thyroid disease Father   . Stroke Paternal Grandfather     ROS: no fevers or chills, productive cough, hemoptysis, dysphasia, odynophagia, melena, hematochezia, dysuria, hematuria, rash, seizure activity, orthopnea, PND, pedal edema, claudication. Remaining systems are negative.  Physical Exam: Well-developed well-nourished in no acute distress.  Skin is warm and dry.  HEENT is normal.  Neck is supple.  Chest is clear to auscultation with normal expansion.  Cardiovascular exam is regular rate and rhythm.  Abdominal exam nontender or  distended. No masses palpated. Extremities show no edema. neuro grossly intact  ECG-normal sinus rhythm at a rate of 84, cannot rule out septal infarct, low voltage.  Personally reviewed  A/P  1 palpitations-plan to continue carvedilol at present dose.  Symptoms reasonably well controlled.  2 hypertension-blood pressure elevated; however typically controlled.  She will bring her cuff to the office and correlate with ours.  We will continue present medications and adjust if needed.  3 hyperlipidemia-continue statin.  [name],  MD

## 2020-07-19 ENCOUNTER — Other Ambulatory Visit: Payer: Self-pay

## 2020-07-19 ENCOUNTER — Encounter: Payer: Self-pay | Admitting: Cardiology

## 2020-07-19 ENCOUNTER — Ambulatory Visit (INDEPENDENT_AMBULATORY_CARE_PROVIDER_SITE_OTHER): Payer: Medicare Other | Admitting: Cardiology

## 2020-07-19 VITALS — BP 136/96 | HR 84 | Ht 63.0 in | Wt 191.0 lb

## 2020-07-19 DIAGNOSIS — R002 Palpitations: Secondary | ICD-10-CM

## 2020-07-19 DIAGNOSIS — I10 Essential (primary) hypertension: Secondary | ICD-10-CM

## 2020-07-19 DIAGNOSIS — E7800 Pure hypercholesterolemia, unspecified: Secondary | ICD-10-CM

## 2020-07-19 NOTE — Patient Instructions (Signed)

## 2020-08-12 DIAGNOSIS — M19012 Primary osteoarthritis, left shoulder: Secondary | ICD-10-CM | POA: Diagnosis not present

## 2020-08-30 DIAGNOSIS — M25512 Pain in left shoulder: Secondary | ICD-10-CM | POA: Diagnosis not present

## 2020-08-30 DIAGNOSIS — M19012 Primary osteoarthritis, left shoulder: Secondary | ICD-10-CM | POA: Diagnosis not present

## 2020-09-06 ENCOUNTER — Telehealth: Payer: Self-pay | Admitting: Obstetrics and Gynecology

## 2020-09-06 NOTE — Telephone Encounter (Signed)
Attempted to return call to pt. No answer, VM box full. Will wait for pt to return call to office.

## 2020-09-06 NOTE — Telephone Encounter (Signed)
Called both numbers on DPR, no answer and VM box full.

## 2020-09-06 NOTE — Telephone Encounter (Signed)
Patient is having some  "poelvic pain" and would like to see Dr.Silva.

## 2020-09-07 ENCOUNTER — Other Ambulatory Visit: Payer: Self-pay

## 2020-09-07 ENCOUNTER — Encounter: Payer: Self-pay | Admitting: Obstetrics and Gynecology

## 2020-09-07 ENCOUNTER — Ambulatory Visit (INDEPENDENT_AMBULATORY_CARE_PROVIDER_SITE_OTHER): Payer: Medicare Other | Admitting: Obstetrics and Gynecology

## 2020-09-07 VITALS — BP 138/76 | HR 78 | Temp 98.8°F | Ht 62.0 in | Wt 189.0 lb

## 2020-09-07 DIAGNOSIS — K5900 Constipation, unspecified: Secondary | ICD-10-CM | POA: Diagnosis not present

## 2020-09-07 DIAGNOSIS — R1030 Lower abdominal pain, unspecified: Secondary | ICD-10-CM

## 2020-09-07 NOTE — Patient Instructions (Signed)
Docusate capsules What is this medicine? DOCUSATE (doc CUE sayt) is stool softener. It helps prevent constipation and straining or discomfort associated with hard or dry stools. This medicine may be used for other purposes; ask your health care provider or pharmacist if you have questions. COMMON BRAND NAME(S): [employer] Stool Softner, Colace, Colace Clear, Correctol, D.O.S., DC, Doc-Q-Lace, DocuLace, Docusoft S, DOK, DOK Extra Strength, Dulcolax, Genasoft, Kao-Tin, Kaopectate Liqui-Gels, Phillips Stool Softener, Stool Softener, Stool Softner DC, Sulfolax, Sur-Q-Lax, Surfak, Uni-Ease What should I tell my health care provider before I take this medicine? They need to know if you have any of these conditions:  nausea or vomiting  severe constipation  stomach pain  sudden change in bowel habit lasting more than 2 weeks  an unusual or allergic reaction to docusate, other medicines, foods, dyes, or preservatives  pregnant or trying to get pregnant  breast-feeding How should I use this medicine? Take this medicine by mouth with a glass of water. Follow the directions on the label. Take your doses at regular intervals. Do not take your medicine more often than directed. Talk to your pediatrician regarding the use of this medicine in children. While this medicine may be prescribed for children as young as 2 years for selected conditions, precautions do apply. Overdosage: If you think you have taken too much of this medicine contact a poison control center or emergency room at once. NOTE: This medicine is only for you. Do not share this medicine with others. What if I miss a dose? If you miss a dose, take it as soon as you can. If it is almost time for your next dose, take only that dose. Do not take double or extra doses. What may interact with this medicine?  mineral oil This list may not describe all possible interactions. Give your health care provider a list of all the medicines, herbs,  non-prescription drugs, or dietary supplements you use. Also tell them if you smoke, drink alcohol, or use illegal drugs. Some items may interact with your medicine. What should I watch for while using this medicine? Do not use for more than one week without advice from your doctor or health care professional. If your constipation returns, check with your doctor or health care professional. Drink plenty of water while taking this medicine. Drinking water helps decrease constipation. Stop using this medicine and contact your doctor or health care professional if you experience any rectal bleeding or do not have a bowel movement after use. These could be signs of a more serious condition. What side effects may I notice from receiving this medicine? Side effects that you should report to your doctor or health care professional as soon as possible:  allergic reactions like skin rash, itching or hives, swelling of the face, lips, or tongue Side effects that usually do not require medical attention (report to your doctor or health care professional if they continue or are bothersome):  diarrhea  stomach cramps  throat irritation This list may not describe all possible side effects. Call your doctor for medical advice about side effects. You may report side effects to FDA at 1-800-FDA-1088. Where should I keep my medicine? Keep out of the reach of children. Store at room temperature between 15 and 30 degrees C (59 and 86 degrees F). Throw away any unused medicine after the expiration date. NOTE: This sheet is a summary. It may not cover all possible information. If you have questions about this medicine, talk to your doctor, pharmacist, or  health care provider.  2020 Elsevier/Gold Standard (2008-01-01 15:56:49)   Polyethylene Glycol powder What is this medicine? POLYETHYLENE GLYCOL 3350 (pol ee ETH i leen; GLYE col) powder is a laxative used to treat constipation. It increases the amount of water  in the stool. Bowel movements become easier and more frequent. This medicine may be used for other purposes; ask your health care provider or pharmacist if you have questions. COMMON BRAND NAME(S): Sharlyn Bologna, GlycoLax, Healthylax, MiraLax, Smooth LAX, [facility] Health What should I tell my health care provider before I take this medicine? They need to know if you have any of these conditions:  a history of blockage of the stomach or intestine  current abdomen distension or pain  difficulty swallowing  diverticulitis, ulcerative colitis, or other chronic bowel disease  phenylketonuria  an unusual or allergic reaction to polyethylene glycol, other medicines, dyes, or preservatives  pregnant or trying to get pregnant  breast-feeding How should I use this medicine? Take this medicine by mouth. The bottle has a measuring cap that is marked with a line. Pour the powder into the cap up to the marked line (the dose is about 1 heaping tablespoon). Add the powder in the cap to a full glass (4 to 8 ounces or 120 to 240 mL) of water, juice, soda, coffee or tea. Mix the powder well. Ensure that the powder is fully dissolved. Do not drink if there are any clumps. Drink the solution. Take exactly as directed. Do not take your medicine more often than directed. Talk to your pediatrician regarding the use of this medicine in children. Special care may be needed. Overdosage: If you think you have taken too much of this medicine contact a poison control center or emergency room at once. NOTE: This medicine is only for you. Do not share this medicine with others. What if I miss a dose? If you miss a dose, take it as soon as you can. If it is almost time for your next dose, take only that dose. Do not take double or extra doses. What may interact with this medicine? Interactions are not expected. This list may not describe all possible interactions. Give your health care provider a list of all the  medicines, herbs, non-prescription drugs, or dietary supplements you use. Also tell them if you smoke, drink alcohol, or use illegal drugs. Some items may interact with your medicine. What should I watch for while using this medicine? Do not use for more than 2 weeks without advice from your doctor or health care professional. It can take 2 to 4 days to have a bowel movement and to experience improvement in constipation. See your health care professional for any changes in bowel habits, including constipation, that are severe or last longer than three weeks. Always take this medicine with plenty of water. What side effects may I notice from receiving this medicine? Side effects that you should report to your doctor or health care professional as soon as possible:  diarrhea  difficulty breathing  itching of the skin, hives, or skin rash  severe bloating, pain, or distension of the stomach  vomiting Side effects that usually do not require medical attention (report to your doctor or health care professional if they continue or are bothersome):  bloating or gas  lower abdominal discomfort or cramps  nausea This list may not describe all possible side effects. Call your doctor for medical advice about side effects. You may report side effects to FDA at 1-800-FDA-1088. Where  should I keep my medicine? Keep out of the reach of children. Store between 15 and 30 degrees C (59 and 86 degrees F). Throw away any unused medicine after the expiration date. NOTE: This sheet is a summary. It may not cover all possible information. If you have questions about this medicine, talk to your doctor, pharmacist, or health care provider.  2020 Elsevier/Gold Standard (2018-02-27 10:42:01)

## 2020-09-07 NOTE — Progress Notes (Signed)
GYNECOLOGY  VISIT   HPI: 69 y.o.   Widowed  Caucasian  female   G3P3 with Patient's last menstrual period was 09/24/2012 (approximate).   here for RLQ pain x 4-5 days. Some LLQ discomfort. Patient denies any urinary symptoms or fever.  Having a steady lower abdominal pain.  She has a nauseous type feeling that is annoying.  Has constipation.  BMs once a day that are firm and like marbles.  She uses Dulcolax.  She tried a rectal suppository and she had results from this.  It helped the discomfort for a while. Bought Metamucil a couple of days ago.  Intake of 2 bottles water per day.  Drinks sweat tea and decaff coffee.   Vegetables once a day and fruit every other day.   Taking Vesicare since February, 2021.  Helps with bladder control but she feels like she is not emptying her bladder completely in the am.   Patient had normal pelvic US 12/10/19.  Denies vaginal bleeding.   No regular exercise due to gym closing.   Husband passed 04/2020 with lung cancer. Dealing with home renovation issues.  Has experienced a lot of losses.  Temp: 98.8  GYNECOLOGIC HISTORY: Patient's last menstrual period was 09/24/2012 (approximate). Contraception:  Tubal Menopausal hormone therapy:  none Last mammogram:  Last pap smear:  10-15-15 Neg:Neg HR HPV. Hx of cryotherapy to cervix in her 20's--normal paps since.        OB History    Gravida  3   Para  3   Term      Preterm      AB      Living  3     SAB      IAB      Ectopic      Multiple      Live Births                 Patient Active Problem List   Diagnosis Date Noted  . OAB (overactive bladder) 10/28/2019  . Atrophic vaginitis 10/22/2018  . Mixed hyperlipidemia 12/04/2017  . Chronic seasonal allergic rhinitis 12/04/2017  . Chronic pansinusitis 09/09/2017  . Laryngopharyngeal reflux (LPR) 09/09/2017  . Chronic cough 07/24/2017  . Status post total right knee replacement 01/24/2016  . Bilateral leg edema  03/30/2014  . History of gastritis 02/10/2014  . Dysphagia 01/28/2014  . Irritable bowel syndrome (IBS) 09/08/2013  . Osteopenia 07/14/2013  . Osteoarthritis, multiple sites 07/14/2013  . Essential hypertension   . Gastroesophageal reflux disease without esophagitis     Past Medical History:  Diagnosis Date  . Anxiety   . Arthritis   . Aspiration pneumonia ([place]) 2013  . Atypical chest pain    Stress test 04/21/10 - post-stress EF=89%. Normal scan.  . Diarrhea   . Diastolic dysfunction    But normal LV function on ECHO 2011 and low risk Myoview in 2011  . DVT (deep venous thrombosis) ([place])   . Dyslipidemia   . GERD (gastroesophageal reflux disease)   . Hypertension   . Osteoarthritis   . Osteopenia   . Palpitations    [employer] MCT Monitor 04/16/10-04/22/10   . Syncope    Pain-mediated syncope    Past Surgical History:  Procedure Laterality Date  . CHOLECYSTECTOMY  2006  . KNEE ARTHROSCOPY Right   . REPLACEMENT TOTAL KNEE Left    left  . REPLACEMENT TOTAL KNEE Right   . TUBAL LIGATION  06/1978    Current Outpatient Medications  Medication  Sig Dispense Refill  . acetaminophen (TYLENOL) 650 MG CR tablet Take 650 mg by mouth every 8 (eight) hours as needed for pain.    Marland Kitchen albuterol (PROVENTIL HFA;VENTOLIN HFA) 108 (90 Base) MCG/ACT inhaler Inhale 2 puffs into the lungs every 4 (four) hours as needed for wheezing or shortness of breath. 1 Inhaler 2  . atorvastatin (LIPITOR) 20 MG tablet TAKE 1 TABLET DAILY 90 tablet 3  . azelastine (ASTELIN) 0.1 % nasal spray Place 2 sprays into both nostrils 2 (two) times daily. Use in each nostril as directed (Patient taking differently: Place 2 sprays into both nostrils 2 (two) times daily as needed for rhinitis. Use in each nostril as directed) 30 mL 12  . busPIRone (BUSPAR) 7.5 MG tablet Take 1 tablet (7.5 mg total) by mouth 3 (three) times daily as needed (anxiety). 180 tablet 3  . carvedilol (COREG) 25 MG tablet Take 1 tablet (25 mg  total) by mouth 2 (two) times daily. 180 tablet 3  . cetirizine (ZYRTEC ALLERGY) 10 MG tablet Take 1 tablet (10 mg total) by mouth daily. (Patient taking differently: Take 10 mg by mouth daily as needed for allergies.) 90 tablet 3  . Cholecalciferol (VITAMIN D) 2000 UNITS CAPS Take 2,000 Units by mouth daily.     . fluticasone (FLONASE) 50 MCG/ACT nasal spray Place 2 sprays into both nostrils daily. 16 g 6  . gabapentin (NEURONTIN) 100 MG capsule gabapentin 100 mg capsule  TAKE 1 CAPSULE BY MOUTH THREE TIMES DAILY AS NEEDED    . losartan (COZAAR) 50 MG tablet AT THE START OF THERAPY TAKE ONE-HALF (1/2) TABLET (25 MG) DAILY FOR 14 DAYS THEN 1 TABLET DAILY THEREAFTER 90 tablet 3  . Multiple Vitamin (MULTIVITAMIN) tablet Take 1 tablet by mouth daily.    Marland Kitchen omeprazole (PRILOSEC) 20 MG capsule TAKE 1 CAPSULE TWICE A DAY 180 capsule 3  . polyethylene glycol powder (GLYCOLAX/MIRALAX) powder Take 17 g by mouth daily as needed. (Patient taking differently: Take 17 g by mouth daily as needed for mild constipation.) 3350 g 1  . Probiotic Product (PROBIOTIC PO) Take 1 capsule by mouth daily.     . solifenacin (VESICARE) 5 MG tablet Take 5 mg by mouth daily.     No current facility-administered medications for this visit.     ALLERGIES: Penicillins, Sulfa antibiotics, Sulfasalazine, Tramadol, Ciprofloxacin, Trimethoprim, and Isovue [iopamidol]  Family History  Problem Relation Age of Onset  . Heart disease Mother   . Other Mother        leg amputation  . Arthritis Mother   . Osteoporosis Mother   . Mental illness Father   . Sudden death Father   . Arthritis Father   . Thyroid disease Father   . Stroke Paternal Grandfather     Social History   Socioeconomic History  . Marital status: Widowed    Spouse name: Not on file  . Number of children: 3  . Years of education: Not on file  . Highest education level: Not on file  Occupational History  . Not on file  Tobacco Use  . Smoking status:  Never Smoker  . Smokeless tobacco: Never Used  Vaping Use  . Vaping Use: Never used  Substance and Sexual Activity  . Alcohol use: Yes    Alcohol/week: 0.0 standard drinks    Comment: 2-3/wine per month  . Drug use: No  . Sexual activity: Never  Other Topics Concern  . Not on file  Social History Narrative  .  Not on file   Social Determinants of Health   Financial Resource Strain: Not on file  Food Insecurity: Not on file  Transportation Needs: Not on file  Physical Activity: Not on file  Stress: Not on file  Social Connections: Not on file  Intimate Partner Violence: Not on file    Review of Systems  Gastrointestinal:       Increased belching and gas Hx of IBS  Genitourinary: Positive for pelvic pain (RLQ and LLQ).  All other systems reviewed and are negative.  PHYSICAL EXAMINATION:    BP 138/76 (Cuff Size: Large)   Pulse 78   Temp 98.8 F (37.1 C) (Oral)   Ht 5\' 2"  (1.575 m)   Wt 189 lb (85.7 kg)   LMP 09/24/2012 (Approximate)   SpO2 98%   BMI 34.57 kg/m     General appearance: alert, cooperative and appears stated age  Abdomen: soft, non-tender, no masses,  no organomegaly  Pelvic: External genitalia:  no lesions              Urethra:  normal appearing urethra with no masses, tenderness or lesions              Bartholins and Skenes: normal                 Vagina: normal appearing vagina with normal color and discharge, no lesions              Cervix: no lesions                Bimanual Exam:  Uterus:  normal size, contour, position, consistency, mobility, non-tender              Adnexa: no mass, fullness, tenderness              Rectal exam: Yes.  .  Confirms.              Anus:  normal sphincter tone, no lesions  Chaperone was present for exam.  ASSESSMENT  Lower abdominal pain.   I suspect constipation as the etiology.  Overactive bladder.  On Vesicare. Normal pelvic ultrasound in March 2021.  PLAN  We reviewed a diet to decrease constipation  symptoms - fruits, vegetables, increased water, high fiber.  Colace and Miralax or Metamucil recommended.  Continue probiotics.  Reduce caffeine intake.  She will call back if not improved in one week.   27 min  total time was spent for this patient encounter, including preparation, face-to-face counseling with the patient, coordination of care, and documentation of the encounter.

## 2020-09-07 NOTE — Telephone Encounter (Signed)
Patient is returning call. Can be reached at [phone].

## 2020-09-07 NOTE — Telephone Encounter (Signed)
Last OV-11/2019 H/o BV  H/o vaginal pain, had PUS in March 2021- normal findings H/o IBS per PCP PMP, no HRT  Spoke with pt. Pt states having pelvic pain/lower abd pain x 4 days. Pt states is intermittent and is unable to gauge when its happening. Pt states also having lots of "gas", but has not been eating different. Pt denies vaginal bleeding, discharge, odor, UTI sx. Last BM yesterday that was normal for her. Pt also having some waves of nausea with pelvic pain, but no vomiting or diarrhea.   Pt advised to have OV for evaluation. Pt agreeable. Pt scheduled today with Dr Quincy Simmonds on 12/15 at 230 pm. Pt verbalized understanding to date and time of appt.  Routing to Dr Quincy Simmonds for review Encounter closed

## 2020-10-03 ENCOUNTER — Other Ambulatory Visit: Payer: Self-pay

## 2020-10-03 ENCOUNTER — Ambulatory Visit (INDEPENDENT_AMBULATORY_CARE_PROVIDER_SITE_OTHER): Payer: Medicare Other | Admitting: Physician Assistant

## 2020-10-03 ENCOUNTER — Encounter: Payer: Self-pay | Admitting: Physician Assistant

## 2020-10-03 ENCOUNTER — Other Ambulatory Visit: Payer: Self-pay | Admitting: Physician Assistant

## 2020-10-03 VITALS — BP 130/88 | HR 88 | Temp 97.9°F | Ht 62.0 in | Wt 191.5 lb

## 2020-10-03 DIAGNOSIS — K219 Gastro-esophageal reflux disease without esophagitis: Secondary | ICD-10-CM

## 2020-10-03 DIAGNOSIS — R300 Dysuria: Secondary | ICD-10-CM

## 2020-10-03 DIAGNOSIS — R350 Frequency of micturition: Secondary | ICD-10-CM | POA: Diagnosis not present

## 2020-10-03 LAB — POCT URINALYSIS DIPSTICK
Bilirubin, UA: NEGATIVE
Blood, UA: POSITIVE
Glucose, UA: NEGATIVE
Ketones, UA: NEGATIVE
Nitrite, UA: POSITIVE
Protein, UA: NEGATIVE
Spec Grav, UA: >=1.03 — AB (ref 1.010–1.025)
Urobilinogen, UA: 1 E.U./dL
pH, UA: 6 (ref 5.0–8.0)

## 2020-10-03 MED ORDER — PANTOPRAZOLE SODIUM 20 MG PO TBEC: 20.0000 mg | DELAYED_RELEASE_TABLET | Freq: Every day | ORAL | 1 refills | Status: DC

## 2020-10-03 MED ORDER — NITROFURANTOIN MONOHYD MACRO 100 MG PO CAPS: 100.0000 mg | ORAL_CAPSULE | Freq: Two times a day (BID) | ORAL | 0 refills | Status: DC

## 2020-10-03 NOTE — Progress Notes (Signed)
Erin West is a 70 y.o. female here for a new problem.  I acted as a Education administrator for [employer], PA-C Anselmo Pickler, LPN   History of Present Illness:   Chief Complaint  Patient presents with  . Dysuria  . Gastroesophageal Reflux    HPI  Dysuria Pt c/o dysuria, frequency started yesterday. Pt took AZO OTC yesterday none today. Denies fever, chills and no back pain. Doesn't have significant hx of UTI.  GERD Pt c/o increase in burping and bloating more the past 2 weeks. Pt says Omeprazole 20 mg BID is not helping anymore. She has been taking this regimen for quite a bit. Was on Nexium at first. Denies: rectal bleeding, changes in stools, unintentional weight loss, nausea, vomiting.   Past Medical History:  Diagnosis Date  . Anxiety   . Arthritis   . Aspiration pneumonia ([place]) 2013  . Atypical chest pain    Stress test 04/21/10 - post-stress EF=89%. Normal scan.  . Diarrhea   . Diastolic dysfunction    But normal LV function on ECHO 2011 and low risk Myoview in 2011  . DVT (deep venous thrombosis) ([place])   . Dyslipidemia   . GERD (gastroesophageal reflux disease)   . Hypertension   . Osteoarthritis   . Osteopenia   . Palpitations    [employer] MCT Monitor 04/16/10-04/22/10   . Syncope    Pain-mediated syncope     Social History   Tobacco Use  . Smoking status: Never Smoker  . Smokeless tobacco: Never Used  Vaping Use  . Vaping Use: Never used  Substance Use Topics  . Alcohol use: Yes    Alcohol/week: 0.0 standard drinks    Comment: 2-3/wine per month  . Drug use: No    Past Surgical History:  Procedure Laterality Date  . CHOLECYSTECTOMY  2006  . KNEE ARTHROSCOPY Right   . REPLACEMENT TOTAL KNEE Left    left  . REPLACEMENT TOTAL KNEE Right   . TUBAL LIGATION  06/1978    Family History  Problem Relation Age of Onset  . Heart disease Mother   . Other Mother        leg amputation  . Arthritis Mother   . Osteoporosis Mother   . Mental illness Father    . Sudden death Father   . Arthritis Father   . Thyroid disease Father   . Stroke Paternal Grandfather     Allergies  Allergen Reactions  . Penicillins Swelling and Rash    Has patient had a PCN reaction causing immediate rash, facial/tongue/throat swelling, SOB or lightheadedness with hypotension: No Has patient had a PCN reaction causing severe rash involving mucus membranes or skin necrosis: No Has patient had a PCN reaction that required hospitalization unknown Has patient had a PCN reaction occurring within the last 10 years: No If all of the above answers are "NO", then may proceed with Cephalosporin use.   . Sulfa Antibiotics Swelling and Rash  . Sulfasalazine Rash and Swelling  . Tramadol Other (See Comments)    Rapid heart rate  . Ciprofloxacin     Muscular pain  . Trimethoprim     rash  . Isovue [Iopamidol] Hives    Pt had sneezing, itchy throat, a couple of hives and swollen, itchy left eye. Pt was given 50 mg po benadryl, and water.  Dr Carlis Abbott checked pt.  We observed pt for 30 mins w/ 5 minute BP checks.  Pt left w/o complication.  Pt will need  full premeds in the future.  Alfonse Alpers, RTRCT    Current Medications:   Current Outpatient Medications:  .  acetaminophen (TYLENOL) 650 MG CR tablet, Take 650 mg by mouth every 8 (eight) hours as needed for pain., Disp: , Rfl:  .  albuterol (PROVENTIL HFA;VENTOLIN HFA) 108 (90 Base) MCG/ACT inhaler, Inhale 2 puffs into the lungs every 4 (four) hours as needed for wheezing or shortness of breath., Disp: 1 Inhaler, Rfl: 2 .  atorvastatin (LIPITOR) 20 MG tablet, TAKE 1 TABLET DAILY, Disp: 90 tablet, Rfl: 3 .  azelastine (ASTELIN) 0.1 % nasal spray, Place 2 sprays into both nostrils 2 (two) times daily. Use in each nostril as directed (Patient taking differently: Place 2 sprays into both nostrils 2 (two) times daily as needed for rhinitis. Use in each nostril as directed), Disp: 30 mL, Rfl: 12 .  busPIRone (BUSPAR) 7.5 MG tablet, Take 1  tablet (7.5 mg total) by mouth 3 (three) times daily as needed (anxiety)., Disp: 180 tablet, Rfl: 3 .  carvedilol (COREG) 25 MG tablet, Take 1 tablet (25 mg total) by mouth 2 (two) times daily., Disp: 180 tablet, Rfl: 3 .  cetirizine (ZYRTEC ALLERGY) 10 MG tablet, Take 1 tablet (10 mg total) by mouth daily. (Patient taking differently: Take 10 mg by mouth daily as needed for allergies.), Disp: 90 tablet, Rfl: 3 .  Cholecalciferol (VITAMIN D) 2000 UNITS CAPS, Take 2,000 Units by mouth daily. , Disp: , Rfl:  .  fluticasone (FLONASE) 50 MCG/ACT nasal spray, Place 2 sprays into both nostrils daily., Disp: 16 g, Rfl: 6 .  gabapentin (NEURONTIN) 100 MG capsule, gabapentin 100 mg capsule  TAKE 1 CAPSULE BY MOUTH THREE TIMES DAILY AS NEEDED, Disp: , Rfl:  .  losartan (COZAAR) 50 MG tablet, AT THE START OF THERAPY TAKE ONE-HALF (1/2) TABLET (25 MG) DAILY FOR 14 DAYS THEN 1 TABLET DAILY THEREAFTER (Patient taking differently: Take 50 mg by mouth daily.), Disp: 90 tablet, Rfl: 3 .  Multiple Vitamin (MULTIVITAMIN) tablet, Take 1 tablet by mouth daily., Disp: , Rfl:  .  nitrofurantoin, macrocrystal-monohydrate, (MACROBID) 100 MG capsule, Take 1 capsule (100 mg total) by mouth 2 (two) times daily., Disp: 10 capsule, Rfl: 0 .  pantoprazole (PROTONIX) 20 MG tablet, Take 1 tablet (20 mg total) by mouth daily., Disp: 30 tablet, Rfl: 1 .  polyethylene glycol powder (GLYCOLAX/MIRALAX) powder, Take 17 g by mouth daily as needed. (Patient taking differently: Take 17 g by mouth daily as needed for mild constipation.), Disp: 3350 g, Rfl: 1 .  Probiotic Product (PROBIOTIC PO), Take 1 capsule by mouth daily. , Disp: , Rfl:    Review of Systems:   ROS  Negative unless otherwise specified per HPI.  Vitals:   Vitals:   10/03/20 1447  BP: 130/88  Pulse: 88  Temp: 97.9 F (36.6 C)  TempSrc: Temporal  SpO2: 97%  Weight: 191 lb 8 oz (86.9 kg)  Height: 5\' 2"  (1.575 m)     Body mass index is 35.03 kg/m.  Physical  Exam:   Physical Exam Vitals and nursing note reviewed.  Constitutional:      General: She is not in acute distress.    Appearance: She is well-developed. She is not ill-appearing, toxic-appearing or sickly-appearing.  Cardiovascular:     Rate and Rhythm: Normal rate and regular rhythm.     Pulses: Normal pulses.     Heart sounds: Normal heart sounds, S1 normal and S2 normal.     Comments:  No LE edema Pulmonary:     Effort: Pulmonary effort is normal.     Breath sounds: Normal breath sounds.  Skin:    General: Skin is warm, dry and intact.  Neurological:     Mental Status: She is alert.     GCS: GCS eye subscore is 4. GCS verbal subscore is 5. GCS motor subscore is 6.  Psychiatric:        Mood and Affect: Mood and affect normal.        Speech: Speech normal.        Behavior: Behavior normal. Behavior is cooperative.     Results for orders placed or performed in visit on 10/03/20  POCT urinalysis dipstick  Result Value Ref Range   Color, UA dark yellow    Clarity, UA cloudy    Glucose, UA Negative Negative   Bilirubin, UA negative    Ketones, UA negative    Spec Grav, UA >=1.030 (A) 1.010 - 1.025   Blood, UA positive    pH, UA 6.0 5.0 - 8.0   Protein, UA Negative Negative   Urobilinogen, UA 1.0 0.2 or 1.0 E.U./dL   Nitrite, UA positive    Leukocytes, UA Moderate (2+) (A) Negative   Appearance     Odor      Assessment and Plan:   Bernise was seen today for dysuria and gastroesophageal reflux.  Diagnoses and all orders for this visit:  Dysuria Symptoms and UA consistent with acute cystitis. Mult abx allergies, will trial macrobid 100 mg BID x 5 days. Follow-up based on culture results and symptom improvement. Worsening precautions advised. -     POCT urinalysis dipstick -     Urine Culture  Gastroesophageal reflux disease without esophagitis Uncontrolled. No red flags. Stop prilosec and trial 20 mg protonix daily. Follow-up with PCP for further  mgmt.  Other orders -     nitrofurantoin, macrocrystal-monohydrate, (MACROBID) 100 MG capsule; Take 1 capsule (100 mg total) by mouth 2 (two) times daily. -     pantoprazole (PROTONIX) 20 MG tablet; Take 1 tablet (20 mg total) by mouth daily.    CMA or LPN served as scribe during this visit. History, Physical, and Plan performed by medical provider. The above documentation has been reviewed and is accurate and complete.   Inda Coke, PA-C

## 2020-10-03 NOTE — Patient Instructions (Signed)
It was great to see you!  Stop prilosec. Start daily protonix in AM.  Start macrobid for your UTI symptoms. Stop AZO.  General instructions  Make sure you: ? Pee until your bladder is empty. ? Do not hold pee for a long time. ? Empty your bladder after sex. ? Wipe from front to back after pooping if you are a female. Use each tissue one time when you wipe.  Drink enough fluid to keep your pee pale yellow.  Keep all follow-up visits as told by your doctor. This is important. Contact a doctor if:  You do not get better after 1-2 days.  Your symptoms go away and then come back. Get help right away if:  You have very bad back pain.  You have very bad pain in your lower belly.  You have a fever.  You are sick to your stomach (nauseous).  You are throwing up.  Take care,  Inda Coke PA-C

## 2020-10-05 LAB — URINE CULTURE
MICRO NUMBER:: 11399430
SPECIMEN QUALITY:: ADEQUATE

## 2020-10-17 NOTE — Progress Notes (Signed)
Pt seen result on My chart.

## 2020-10-20 ENCOUNTER — Telehealth: Payer: Self-pay

## 2020-10-20 NOTE — Telephone Encounter (Signed)
Please call pt and schedule an appt or she can go to Urgent care. Dr. Jonni Sanger has openings tomorrow.

## 2020-10-20 NOTE — Telephone Encounter (Signed)
Patients is requesting a different antibiotic or medication to help with her UTI symptoms she was seen on 1/10 by [facility] and has not improved and seems like medication is not improving  Can we send her something else in ?

## 2020-10-20 NOTE — Telephone Encounter (Signed)
Pt called back regarding her UTI . Pt states she is a in a lot of pain and needs something to help. Please advise.

## 2020-10-20 NOTE — Telephone Encounter (Signed)
Scheduled pt to see Dr. Jonni Sanger tomorrow at 2 pm. Pt wants to know what her last urine culture showed when she last came into the office.

## 2020-10-20 NOTE — Telephone Encounter (Signed)
LMOVM to return call to schedule appt with PCP

## 2020-10-21 ENCOUNTER — Encounter: Payer: Self-pay | Admitting: Family Medicine

## 2020-10-21 ENCOUNTER — Ambulatory Visit (INDEPENDENT_AMBULATORY_CARE_PROVIDER_SITE_OTHER): Payer: Medicare Other | Admitting: Family Medicine

## 2020-10-21 ENCOUNTER — Other Ambulatory Visit: Payer: Self-pay

## 2020-10-21 VITALS — BP 106/74 | HR 83 | Temp 97.3°F | Resp 16 | Wt 191.4 lb

## 2020-10-21 DIAGNOSIS — R300 Dysuria: Secondary | ICD-10-CM | POA: Diagnosis not present

## 2020-10-21 DIAGNOSIS — N3090 Cystitis, unspecified without hematuria: Secondary | ICD-10-CM

## 2020-10-21 DIAGNOSIS — K219 Gastro-esophageal reflux disease without esophagitis: Secondary | ICD-10-CM | POA: Diagnosis not present

## 2020-10-21 LAB — POCT URINALYSIS DIPSTICK
Bilirubin, UA: NEGATIVE
Blood, UA: POSITIVE
Glucose, UA: NEGATIVE
Ketones, UA: NEGATIVE
Nitrite, UA: POSITIVE
Protein, UA: POSITIVE — AB
Spec Grav, UA: >=1.03 — AB (ref 1.010–1.025)
Urobilinogen, UA: 1 E.U./dL
pH, UA: 6 (ref 5.0–8.0)

## 2020-10-21 MED ORDER — LOSARTAN POTASSIUM 50 MG PO TABS: 50.0000 mg | ORAL_TABLET | Freq: Every day | ORAL | Status: DC

## 2020-10-21 MED ORDER — PANTOPRAZOLE SODIUM 20 MG PO TBEC: 20.0000 mg | DELAYED_RELEASE_TABLET | Freq: Every day | ORAL | 3 refills | Status: DC

## 2020-10-21 MED ORDER — NITROFURANTOIN MONOHYD MACRO 100 MG PO CAPS: 100.0000 mg | ORAL_CAPSULE | Freq: Two times a day (BID) | ORAL | 0 refills | Status: DC

## 2020-10-21 NOTE — Patient Instructions (Signed)
Please follow up as scheduled for your next visit with me: 11/29/2020   If you have any questions or concerns, please don't hesitate to send me a message via MyChart or call the office at [phone]. Thank you for visiting with [place] today! It's our pleasure caring for you.

## 2020-10-21 NOTE — Progress Notes (Signed)
Subjective   CC:  Chief Complaint  Patient presents with  . Urinary Tract Infection    HPI: Erin West is a 70 y.o. female who presents to the office today to address the problems listed above in the chief complaint.  Patient reports dysuria and urinary frequency.  She has sensation of increased urinary pressure.  She denies fevers flank pain nausea vomiting or gross hematuria.  Symptoms have been present for several hours to days.  She denies history of interstitial cystitis.  She denies vaginal symptoms including vaginal discharge or pelvic pain. Had E.coli UTI treated 10/03/2020. Notes reviewed.  GERD/LPR: Reviewed last note.  She was having breakthrough symptoms on chronic omeprazole.  She was changed to Protonix 20 mg daily and fortunately her symptoms have improved.  No longer with reflux or cough.  No new symptoms.  Needs refills.   Assessment  1. Recurrent cystitis   2. Dysuria   3. Gastroesophageal reflux disease without esophagitis   4. Laryngopharyngeal reflux (LPR)      Plan   Recurrent cystitis: Check urine culture.  Treat again with Macrobid but 7 days course of treatment due to recent infection.  Discussed preventive measures.  GERD: Refill Protonix.  Improved  Follow up: Return for as scheduled.  Orders Placed This Encounter  Procedures  . Urine Culture  . POCT urinalysis dipstick   Meds ordered this encounter  Medications  . nitrofurantoin, macrocrystal-monohydrate, (MACROBID) 100 MG capsule    Sig: Take 1 capsule (100 mg total) by mouth 2 (two) times daily.    Dispense:  14 capsule    Refill:  0  . losartan (COZAAR) 50 MG tablet    Sig: Take 1 tablet (50 mg total) by mouth daily.  . pantoprazole (PROTONIX) 20 MG tablet    Sig: Take 1 tablet (20 mg total) by mouth daily.    Dispense:  90 tablet    Refill:  3    **Patient requests 90 days supply**      I reviewed the patients updated PMH, FH, and SocHx.    Patient Active Problem List    Diagnosis Date Noted  . Mixed hyperlipidemia 12/04/2017    Priority: High  . Chronic cough 07/24/2017    Priority: High  . Essential hypertension     Priority: High  . OAB (overactive bladder) 10/28/2019    Priority: Medium  . Chronic seasonal allergic rhinitis 12/04/2017    Priority: Medium  . Chronic pansinusitis 09/09/2017    Priority: Medium  . Laryngopharyngeal reflux (LPR) 09/09/2017    Priority: Medium  . Bilateral leg edema 03/30/2014    Priority: Medium  . History of gastritis 02/10/2014    Priority: Medium  . Dysphagia 01/28/2014    Priority: Medium  . Irritable bowel syndrome (IBS) 09/08/2013    Priority: Medium  . Osteopenia 07/14/2013    Priority: Medium  . Osteoarthritis, multiple sites 07/14/2013    Priority: Medium  . Gastroesophageal reflux disease without esophagitis     Priority: Medium  . Atrophic vaginitis 10/22/2018    Priority: Low  . Status post total right knee replacement 01/24/2016    Priority: Low   Current Meds  Medication Sig  . acetaminophen (TYLENOL) 650 MG CR tablet Take 650 mg by mouth every 8 (eight) hours as needed for pain.  Marland Kitchen albuterol (PROVENTIL HFA;VENTOLIN HFA) 108 (90 Base) MCG/ACT inhaler Inhale 2 puffs into the lungs every 4 (four) hours as needed for wheezing or shortness of breath.  Marland Kitchen  atorvastatin (LIPITOR) 20 MG tablet TAKE 1 TABLET DAILY  . azelastine (ASTELIN) 0.1 % nasal spray Place 2 sprays into both nostrils 2 (two) times daily. Use in each nostril as directed (Patient taking differently: Place 2 sprays into both nostrils 2 (two) times daily as needed for rhinitis. Use in each nostril as directed)  . busPIRone (BUSPAR) 7.5 MG tablet Take 1 tablet (7.5 mg total) by mouth 3 (three) times daily as needed (anxiety).  . carvedilol (COREG) 25 MG tablet Take 1 tablet (25 mg total) by mouth 2 (two) times daily.  . cetirizine (ZYRTEC ALLERGY) 10 MG tablet Take 1 tablet (10 mg total) by mouth daily. (Patient taking differently: Take  10 mg by mouth daily as needed for allergies.)  . Cholecalciferol (VITAMIN D) 2000 UNITS CAPS Take 2,000 Units by mouth daily.   . fluticasone (FLONASE) 50 MCG/ACT nasal spray Place 2 sprays into both nostrils daily.  Marland Kitchen gabapentin (NEURONTIN) 100 MG capsule gabapentin 100 mg capsule  TAKE 1 CAPSULE BY MOUTH THREE TIMES DAILY AS NEEDED  . Multiple Vitamin (MULTIVITAMIN) tablet Take 1 tablet by mouth daily.  . nitrofurantoin, macrocrystal-monohydrate, (MACROBID) 100 MG capsule Take 1 capsule (100 mg total) by mouth 2 (two) times daily.  . polyethylene glycol powder (GLYCOLAX/MIRALAX) powder Take 17 g by mouth daily as needed. (Patient taking differently: Take 17 g by mouth daily as needed for mild constipation.)  . Probiotic Product (PROBIOTIC PO) Take 1 capsule by mouth daily.   . [DISCONTINUED] losartan (COZAAR) 50 MG tablet AT THE START OF THERAPY TAKE ONE-HALF (1/2) TABLET (25 MG) DAILY FOR 14 DAYS THEN 1 TABLET DAILY THEREAFTER (Patient taking differently: Take 50 mg by mouth daily.)  . [DISCONTINUED] nitrofurantoin, macrocrystal-monohydrate, (MACROBID) 100 MG capsule Take 1 capsule (100 mg total) by mouth 2 (two) times daily.  . [DISCONTINUED] pantoprazole (PROTONIX) 20 MG tablet TAKE 1 TABLET(20 MG) BY MOUTH DAILY    Review of Systems: Cardiovascular: negative for chest pain Respiratory: negative for SOB or persistent cough Gastrointestinal: negative for abdominal pain Constitutional: Negative for fever malaise or anorexia  Objective  Vitals: BP 106/74   Pulse 83   Temp (!) 97.3 F (36.3 C) (Temporal)   Resp 16   Wt 191 lb 6.4 oz (86.8 kg)   LMP 09/24/2012 (Approximate)   SpO2 96%   BMI 35.01 kg/m  General: no acute distress  Psych:  Alert and oriented, normal mood and affect Gastrointestinal: soft, flat abdomen, normal active bowel sounds, no palpable masses, no hepatosplenomegaly, no appreciated hernias, NO CVAT, mild suprapubic ttp w/o rebound or guarding  Office Visit on  10/21/2020  Component Date Value Ref Range Status  . Color, UA 10/21/2020 yellow   Final  . Clarity, UA 10/21/2020 cloudy   Final  . Glucose, UA 10/21/2020 Negative  Negative Final  . Bilirubin, UA 10/21/2020 Negative   Final  . Ketones, UA 10/21/2020 Negative   Final  . Spec Grav, UA 10/21/2020 >=1.030* 1.010 - 1.025 Final  . Blood, UA 10/21/2020 positive   Final  . pH, UA 10/21/2020 6.0  5.0 - 8.0 Final  . Protein, UA 10/21/2020 Positive* Negative Final  . Urobilinogen, UA 10/21/2020 1.0  0.2 or 1.0 E.U./dL Final  . Nitrite, UA 10/21/2020 Positive   Final  . Leukocytes, UA 10/21/2020 Moderate (2+)* Negative Final    Commons side effects, risks, benefits, and alternatives for medications and treatment plan prescribed today were discussed, and the patient expressed understanding of the given instructions. Patient is  instructed to call or message via MyChart if he/she has any questions or concerns regarding our treatment plan. No barriers to understanding were identified. We discussed Red Flag symptoms and signs in detail. Patient expressed understanding regarding what to do in case of urgent or emergency type symptoms.   Medication list was reconciled, printed and provided to the patient in AVS. Patient instructions and summary information was reviewed with the patient as documented in the AVS. This note was prepared with assistance of Dragon voice recognition software. Occasional wrong-word or sound-a-like substitutions may have occurred due to the inherent limitations of voice recognition software

## 2020-10-23 LAB — URINE CULTURE
MICRO NUMBER:: 11470212
SPECIMEN QUALITY:: ADEQUATE

## 2020-10-24 ENCOUNTER — Telehealth: Payer: Self-pay

## 2020-10-24 ENCOUNTER — Other Ambulatory Visit: Payer: Medicare Other

## 2020-10-24 ENCOUNTER — Other Ambulatory Visit: Payer: Self-pay | Admitting: Family Medicine

## 2020-10-24 DIAGNOSIS — Z20822 Contact with and (suspected) exposure to covid-19: Secondary | ICD-10-CM

## 2020-10-24 NOTE — Progress Notes (Signed)
+   UTI sensitive to abx used. No further action needed

## 2020-10-24 NOTE — Telephone Encounter (Signed)
FYI

## 2020-10-24 NOTE — Telephone Encounter (Signed)
Nurse Assessment Nurse: Clovis Riley RN, Georgina Peer Date/Time (Eastern Time): 10/24/2020 10:30:49 AM Confirm and document reason for call. If symptomatic, describe symptoms. ---Caller states when she take a deep breath she has chest pain. Negative home test yesterday was negative. No fever. states she is tired and had a headache friday and saturday. cough is dry. Hx of bronchitis around this time of year. pain is at the middle of chest and is 3/10. Does the patient have any new or worsening symptoms? ---Yes Will a triage be completed? ---Yes Related visit to physician within the last 2 weeks? ---No Does the PT have any chronic conditions? (i.e. diabetes, asthma, this includes High risk factors for pregnancy, etc.) ---Yes List chronic conditions. ---HTN, cholesterol Is this a behavioral health or substance abuse call? ---No Guidelines Guideline Title Affirmed Question Affirmed Notes Nurse Date/Time (Eastern Time) Cough - Acute NonProductive ALSO, mild central chest pain occurs only when coughing Clovis Riley, RNGeorgina Peer 10/24/2020 10:34:47 AM Disp. Time Eilene Ghazi Time) Disposition Final User 10/24/2020 10:29:54 AM Send to Urgent Elease Hashimoto 10/24/2020 10:41:12 AM Home Care Yes Clovis Riley, RN, Georgina Peer PLEASE NOTE: All timestamps contained within this report are represented as [place] Standard Time. CONFIDENTIALTY NOTICE: This fax transmission is intended only for the addressee. It contains information that is legally privileged, confidential or otherwise protected from use or disclosure. If you are not the intended recipient, you are strictly prohibited from reviewing, disclosing, copying using or disseminating any of this information or taking any action in reliance on or regarding this information. If you have received this fax in error, please notify us immediately by telephone so that we can arrange for its return to [place]. Phone: [phone], Toll-Free: [phone], Fax: [phone] Page: 2 of  2 Call Id: [id number] Caller Disagree/Comply Comply Caller Understands Yes PreDisposition Did not know what to do Care Advice Given Per Guideline HOME CARE: * You should be able to treat this at home. REASSURANCE AND EDUCATION - COUGH: * Coughing is the way that our lungs remove irritants and mucus. It helps protect our lungs from getting pneumonia. * You can get a dry hacking cough after a chest cold. Sometimes this type of cough can last 1 to 3 weeks, and be worse at night. * You can also get a cough after being exposed to irritating substances like smoke, strong perfumes, and dust. * Here is some care advice that should help. COUGH DROPS FOR COUGH: * Cough drops can help a lot, especially for mild coughs. They reduce coughing by soothing your irritated throat and removing that tickle sensation in the back of the throat. * Cough drops also have the advantage of portability - you can carry them with you. * HOME REMEDY - HARD CANDY: Hard candy works just as well as a medicine-flavored OTC cough drops. * COUGH SYRUP WITH DEXTROMETHORPHAN: An over-the-counter cough syrup can help your cough. The most common cough suppressant in over-the-counter cough medicines is dextromethorphan. COUGH MEDICINES: * Examples: Delsym 12-hour Cough, Robitussin Cough Long-Acting, Triaminic Long-Acting, Vicks DayQuil Cough. COUGH SYRUP WITH DEXTROMETHORPHAN: * HOME REMEDY - HONEY: This old home remedy has been shown to help decrease coughing at night. The adult dosage is 2 teaspoons (10 ml) at bedtime. HUMIDIFIER: * If the air is dry, use a humidifier in the bedroom. * Dry air makes coughs worse. COUGHING SPELLS: * Drink warm fluids. Inhale warm mist. This can help relax the airway and also loosen up phlegm. * Suck on cough drops or hard candy to coat the  irritated throat. CARE ADVICE given per Cough - Acute Non-Productive (Adult) guideline. CALL BACK IF: * Cough lasts over 3 weeks * Continuous coughing persists over 2  hours after cough treatment * Fever over 103 F (39.4 C) * Difficulty breathing occurs * Fever lasts more than 3 days * You become worse PAIN MEDICINES: * ACETAMINOPHEN - EXTRA STRENGTH TYLENOL: Take 1,000 mg (two 500 mg pills) every 8 hours as needed. Each Extra Strength Tylenol pill has 500 mg of acetaminophen. The most you should take each day is 3,000 mg (6 pills a day). * IBUPROFEN (E.G., MOTRIN, ADVIL): Take 400 mg (two 200 mg pills) by mouth every 6 hours. The most you should take each day is 1,200 mg (six 200 mg pills), unless your doctor has told you to take more. CALL BACK IF: * Chest pain increases or becomes constant. * Fever over 103 F (39.4 C), or fever lasts over 3 days * Difficulty breathing occurs * You become wors

## 2020-10-25 DIAGNOSIS — E669 Obesity, unspecified: Secondary | ICD-10-CM | POA: Diagnosis not present

## 2020-10-25 DIAGNOSIS — K5904 Chronic idiopathic constipation: Secondary | ICD-10-CM | POA: Diagnosis not present

## 2020-10-25 DIAGNOSIS — K219 Gastro-esophageal reflux disease without esophagitis: Secondary | ICD-10-CM | POA: Diagnosis not present

## 2020-10-25 DIAGNOSIS — Z86010 Personal history of colonic polyps: Secondary | ICD-10-CM | POA: Diagnosis not present

## 2020-10-25 DIAGNOSIS — R1013 Epigastric pain: Secondary | ICD-10-CM | POA: Diagnosis not present

## 2020-10-26 LAB — SARS-COV-2, NAA 2 DAY TAT

## 2020-10-26 LAB — NOVEL CORONAVIRUS, NAA: SARS-CoV-2, NAA: NOT DETECTED

## 2020-10-31 ENCOUNTER — Telehealth: Payer: Self-pay

## 2020-10-31 NOTE — Telephone Encounter (Signed)
Patient is calling in stating that she talked with the triage nurse last week about her chest pain and bad cough, has tried over the counter medicine but has not felt any better. Erin West is requesting an appointment tomorrow in office, has had a negative covid test. Okay to schedule in office?

## 2020-11-01 NOTE — Telephone Encounter (Signed)
Pt needs seen in ED to address chest pain.

## 2020-11-01 NOTE — Telephone Encounter (Signed)
Patient is calling in asking for a follow up, asked patient if she was still having the pain when taking a deep breath, she said yes but the pain is still a 3/10 and only occurs after a bad coughing fit, and taking a deep breath. Christee was triaged on 10/24/20 sent back for advice, has tested negative with PCR test and is just wanting to have someone take a listen to her lungs to make sure she does not have bronchitis.

## 2020-11-01 NOTE — Telephone Encounter (Signed)
Please see message below

## 2020-11-01 NOTE — Telephone Encounter (Signed)
Please advise 

## 2020-11-01 NOTE — Telephone Encounter (Signed)
Sorry I am seeing this after 5 PM-I generally am not able to look at my in basket until after I see all my patients unless alerted to look specifically.  I am going to forward to Dr. Jonni Sanger to address tomorrow as she will be back in office and may want to see patient

## 2020-11-02 ENCOUNTER — Ambulatory Visit (INDEPENDENT_AMBULATORY_CARE_PROVIDER_SITE_OTHER): Payer: Medicare Other | Admitting: Family Medicine

## 2020-11-02 ENCOUNTER — Telehealth: Payer: Self-pay

## 2020-11-02 ENCOUNTER — Other Ambulatory Visit: Payer: Self-pay

## 2020-11-02 ENCOUNTER — Encounter: Payer: Self-pay | Admitting: Family Medicine

## 2020-11-02 VITALS — BP 140/92 | HR 78 | Temp 98.1°F | Ht 62.0 in | Wt 188.6 lb

## 2020-11-02 DIAGNOSIS — F321 Major depressive disorder, single episode, moderate: Secondary | ICD-10-CM | POA: Diagnosis not present

## 2020-11-02 DIAGNOSIS — K219 Gastro-esophageal reflux disease without esophagitis: Secondary | ICD-10-CM

## 2020-11-02 DIAGNOSIS — J302 Other seasonal allergic rhinitis: Secondary | ICD-10-CM

## 2020-11-02 DIAGNOSIS — R053 Chronic cough: Secondary | ICD-10-CM | POA: Diagnosis not present

## 2020-11-02 DIAGNOSIS — I10 Essential (primary) hypertension: Secondary | ICD-10-CM

## 2020-11-02 MED ORDER — LOSARTAN POTASSIUM 50 MG PO TABS: 50.0000 mg | ORAL_TABLET | Freq: Every day | ORAL | 3 refills | Status: DC

## 2020-11-02 MED ORDER — MONTELUKAST SODIUM 10 MG PO TABS: 10.0000 mg | ORAL_TABLET | Freq: Every day | ORAL | 3 refills | Status: DC

## 2020-11-02 MED ORDER — CETIRIZINE HCL 10 MG PO TABS: 10.0000 mg | ORAL_TABLET | Freq: Every day | ORAL | 3 refills | Status: DC

## 2020-11-02 NOTE — Telephone Encounter (Signed)
Please schedule an appointment with me in person.

## 2020-11-02 NOTE — Telephone Encounter (Signed)
Patient is scheduled for today at 3 PM.

## 2020-11-02 NOTE — Progress Notes (Signed)
Subjective  CC:  Chief Complaint  Patient presents with  . Cough    Dry cough, Not improving since last visit     HPI: Erin West is a 70 y.o. female who presents to the office today to address the problems listed above in the chief complaint.  Chronic cough: Patient presents due to exacerbation chronic cough.  She is seen a few weeks ago and states she is improving since adjusting her PPI.  However she reports the cough is better.  She continues to coughing spells that are not associated with fever, sore throat, reflux symptoms, abdominal pain, nausea, postnasal drip.  She does endorse increased nasal congestion.  Record review shows chronic cough since 2016.  She has seen multiple specialists including allergy, pulmonology, gastroenterology and ENT.  She has been diagnosed with LPR, GERD and chronic allergies.  She only takes Zyrtec as needed.  She is on Flonase.  She takes her PPI daily.  She does not feel sick.  She prefers to avoid prednisone due to side effects of insomnia.  History of anxiety.  She is widowed 5 months ago and seems to be suffering from worsening depressive symptoms.  She reports anhedonia, nightly crying, low motivation and sadness.  She never had counseling for this.  She is on BuSpar for anxiety since a year ago.  That has been only mildly helpful.  She does not have panic attacks.  She is not suicidal.  Hypertension: Has been very well controlled.  Mildly elevated today.  Likely due to mental state, anxiety   Assessment  1. Chronic cough   2. Laryngopharyngeal reflux (LPR)   3. Gastroesophageal reflux disease without esophagitis   4. Chronic seasonal allergic rhinitis   5. Depression, major, single episode, moderate ([place])   6. Essential hypertension      Plan   Chronic cough due to LPR, GERD, allergies: Again educated on multifactorial nature of her cough.  No infectious symptoms identified.  Restart Zyrtec nightly, continue Flonase and add Singulair.   Continue daily PPI.,  Protonix 20 mg daily.  Reassured.  If can get under better control with refer back to allergist.  Patient understands and agrees with care plan.  Depression, grief reaction, complicated: Recommend counseling.  Discussed options of starting an antidepressant.  Patient is hesitant.  Will refer for counseling and follow-up here if medications are indicated.  Hypertension: Elevated reading today.  Will monitor.  Has follow-up with 1 month.  Continue losartan 50 mg daily and carvedilol 25 twice daily   Follow up: As scheduled for complete physical 11/29/2020  No orders of the defined types were placed in this encounter.  Meds ordered this encounter  Medications  . losartan (COZAAR) 50 MG tablet    Sig: Take 1 tablet (50 mg total) by mouth daily.    Dispense:  90 tablet    Refill:  3  . cetirizine (ZYRTEC) 10 MG tablet    Sig: Take 1 tablet (10 mg total) by mouth daily.    Dispense:  90 tablet    Refill:  3  . montelukast (SINGULAIR) 10 MG tablet    Sig: Take 1 tablet (10 mg total) by mouth at bedtime.    Dispense:  90 tablet    Refill:  3      I reviewed the patients updated PMH, FH, and SocHx.    Patient Active Problem List   Diagnosis Date Noted  . Mixed hyperlipidemia 12/04/2017    Priority: High  .  Chronic cough 07/24/2017    Priority: High  . Essential hypertension     Priority: High  . OAB (overactive bladder) 10/28/2019    Priority: Medium  . Chronic seasonal allergic rhinitis 12/04/2017    Priority: Medium  . Chronic pansinusitis 09/09/2017    Priority: Medium  . Laryngopharyngeal reflux (LPR) 09/09/2017    Priority: Medium  . Bilateral leg edema 03/30/2014    Priority: Medium  . History of gastritis 02/10/2014    Priority: Medium  . Dysphagia 01/28/2014    Priority: Medium  . Irritable bowel syndrome (IBS) 09/08/2013    Priority: Medium  . Osteopenia 07/14/2013    Priority: Medium  . Osteoarthritis, multiple sites 07/14/2013     Priority: Medium  . Gastroesophageal reflux disease without esophagitis     Priority: Medium  . Atrophic vaginitis 10/22/2018    Priority: Low  . Status post total right knee replacement 01/24/2016    Priority: Low  . Depression, major, single episode, moderate ([facility]) 11/02/2020   Current Meds  Medication Sig  . acetaminophen (TYLENOL) 650 MG CR tablet Take 650 mg by mouth every 8 (eight) hours as needed for pain.  Marland Kitchen albuterol (PROVENTIL HFA;VENTOLIN HFA) 108 (90 Base) MCG/ACT inhaler Inhale 2 puffs into the lungs every 4 (four) hours as needed for wheezing or shortness of breath.  Marland Kitchen atorvastatin (LIPITOR) 20 MG tablet TAKE 1 TABLET DAILY  . azelastine (ASTELIN) 0.1 % nasal spray Place 2 sprays into both nostrils 2 (two) times daily. Use in each nostril as directed (Patient taking differently: Place 2 sprays into both nostrils 2 (two) times daily as needed for rhinitis. Use in each nostril as directed)  . busPIRone (BUSPAR) 7.5 MG tablet Take 1 tablet (7.5 mg total) by mouth 3 (three) times daily as needed (anxiety).  . carvedilol (COREG) 25 MG tablet Take 1 tablet (25 mg total) by mouth 2 (two) times daily.  . cetirizine (ZYRTEC) 10 MG tablet Take 1 tablet (10 mg total) by mouth daily.  . Cholecalciferol (VITAMIN D) 2000 UNITS CAPS Take 2,000 Units by mouth daily.   . fluticasone (FLONASE) 50 MCG/ACT nasal spray Place 2 sprays into both nostrils daily.  Marland Kitchen gabapentin (NEURONTIN) 100 MG capsule gabapentin 100 mg capsule  TAKE 1 CAPSULE BY MOUTH THREE TIMES DAILY AS NEEDED  . montelukast (SINGULAIR) 10 MG tablet Take 1 tablet (10 mg total) by mouth at bedtime.  . Multiple Vitamin (MULTIVITAMIN) tablet Take 1 tablet by mouth daily.  . nitrofurantoin, macrocrystal-monohydrate, (MACROBID) 100 MG capsule Take 1 capsule (100 mg total) by mouth 2 (two) times daily.  . pantoprazole (PROTONIX) 20 MG tablet Take 1 tablet (20 mg total) by mouth daily.  . polyethylene glycol powder (GLYCOLAX/MIRALAX)  powder Take 17 g by mouth daily as needed. (Patient taking differently: Take 17 g by mouth daily as needed for mild constipation.)  . Probiotic Product (PROBIOTIC PO) Take 1 capsule by mouth daily.   . [DISCONTINUED] cetirizine (ZYRTEC ALLERGY) 10 MG tablet Take 1 tablet (10 mg total) by mouth daily. (Patient taking differently: Take 10 mg by mouth daily as needed for allergies.)  . [DISCONTINUED] losartan (COZAAR) 50 MG tablet AT THE START OF THERAPY TAKE ONE-HALF (1/2) TABLET (25 MG) DAILY FOR 14 DAYS THEN 1 TABLET DAILY THEREAFTER    Allergies: Patient is allergic to penicillins, sulfa antibiotics, sulfasalazine, tramadol, ciprofloxacin, trimethoprim, and isovue [iopamidol]. Family History: Patient family history includes Arthritis in her father and mother; Heart disease in her mother; Mental illness  in her father; Osteoporosis in her mother; Other in her mother; Stroke in her paternal grandfather; Sudden death in her father; Thyroid disease in her father. Social History:  Patient  reports that she has never smoked. She has never used smokeless tobacco. She reports current alcohol use. She reports that she does not use drugs.  Review of Systems: Constitutional: Negative for fever malaise or anorexia Cardiovascular: negative for chest pain Respiratory: negative for SOB or persistent cough Gastrointestinal: negative for abdominal pain  Objective  Vitals: BP (!) 140/92   Pulse 78   Temp 98.1 F (36.7 C) (Temporal)   Ht 5\' 2"  (1.575 m)   Wt 188 lb 9.6 oz (85.5 kg)   LMP 09/24/2012 (Approximate)   SpO2 94%   BMI 34.50 kg/m  General: no acute distress , A&Ox3, occasional cough present Psych: Flat affect, poor insight HEENT: PEERL, conjunctiva normal, neck is supple Cardiovascular:  RRR without murmur or gallop.  Respiratory:  Good breath sounds bilaterally, CTAB with normal respiratory effort Skin:  Warm, no rashes     Commons side effects, risks, benefits, and alternatives for  medications and treatment plan prescribed today were discussed, and the patient expressed understanding of the given instructions. Patient is instructed to call or message via MyChart if he/she has any questions or concerns regarding our treatment plan. No barriers to understanding were identified. We discussed Red Flag symptoms and signs in detail. Patient expressed understanding regarding what to do in case of urgent or emergency type symptoms.   Medication list was reconciled, printed and provided to the patient in AVS. Patient instructions and summary information was reviewed with the patient as documented in the AVS. This note was prepared with assistance of Dragon voice recognition software. Occasional wrong-word or sound-a-like substitutions may have occurred due to the inherent limitations of voice recognition software  This visit occurred during the SARS-CoV-2 public health emergency.  Safety protocols were in place, including screening questions prior to the visit, additional usage of staff PPE, and extensive cleaning of exam room while observing appropriate contact time as indicated for disinfecting solutions.

## 2020-11-02 NOTE — Patient Instructions (Addendum)
Please follow up as scheduled for your next visit with me: 11/29/2020   I have ordered zyrtec and singulair to take nightly. These are for your allergies.  You have had a chronic cough dating back to 2016: you have seen allergy, pulmonology, ENT and gastroenterology.  At this point, if the current medications do not help, then I would send you back to the allergies. No other problems outside of reflux and allergies were identified.   Please call [facility] to schedule an appointment with Dr. Trey Paula; she is a therapist here at our [place] office.  The phone number is: [phone]  If you have any questions or concerns, please don't hesitate to send me a message via MyChart or call the office at [phone]. Thank you for visiting with [place] today! It's our pleasure caring for you.   Complicated Grief Grief is a normal response to the death of someone close to you. Feelings of fear, anger, and guilt can affect almost everyone who loses a loved one. It is also common to have symptoms of depression while you are grieving. These include problems with sleep, loss of appetite, and lack of energy. They may last for weeks or months after a loss. Complicated grief is different from normal grief or depression. Normal grieving involves sadness and feelings of loss, but those feelings get better and heal over time. Complicated grief is a severe type of grief that lasts for a long time, usually for several months to a year or longer. It interferes with your ability to function normally. Complicated grief may require treatment from a mental health care provider. What are the causes? The cause of this condition is not known. It is not clear why some people continue to struggle with grief and others do not. What increases the risk? You are more likely to develop this condition if:  The death of your loved one was sudden or unexpected.  The death of your loved one was due to a  violent event.  Your loved one died from suicide.  Your loved one was a child or a young person.  You were very close to your loved one, or you were dependent on him or her.  You have a history of depression or anxiety. What are the signs or symptoms? Symptoms of this condition include:  Feeling disbelief or having a lack of emotion (numbness).  Being unable to enjoy good memories of your loved one.  Needing to avoid anything or anyone that reminds you of your loved one.  Being unable to stop thinking about the death.  Feeling intense anger or guilt.  Feeling alone and hopeless.  Feeling that your life is meaningless and empty.  Losing the desire to move on with your life. How is this diagnosed? This condition may be diagnosed based on:  Your symptoms. Complicated grief will be diagnosed if you have ongoing symptoms of grief for 6-12 months or longer.  The effect of symptoms on your life. You may be diagnosed with this condition if your symptoms are interfering with your ability to live your life. Your health care provider may recommend that you see a mental health care provider. Many symptoms of depression are similar to the symptoms of complicated grief. It is important to be evaluated for complicated grief along with other mental health conditions. How is this treated? This condition is most commonly treated with talk therapy. This therapy is offered by a mental health specialist (psychiatrist). During  therapy:  You will learn healthy ways to cope with the loss of your loved one.  Your mental health care provider may recommend antidepressant medicines.   Follow these instructions at home: Lifestyle  Take care of yourself. ? Eat on a regular basis, and maintain a healthy diet. Eat plenty of fruits, vegetables, lean protein, and whole grains. ? Try to get some exercise each day. Aim for 30 minutes of exercise on most days of the week. ? Keep a consistent sleep schedule.  Try to get 8 or more hours of sleep each night. ? Start doing the things that you used to enjoy.  Do not use drugs or alcohol to ease your symptoms.  Spend time with friends and loved ones.   General instructions  Take over-the-counter and prescription medicines only as told by your health care provider.  Consider joining a grief (bereavement) support group to help you deal with your loss.  Keep all follow-up visits as told by your health care provider. This is important. Contact a health care provider if:  Your symptoms prevent you from functioning normally.  Your symptoms do not get better with treatment. Get help right away if:  You have serious thoughts about hurting yourself or someone else.  You have suicidal feelings. If you ever feel like you may hurt yourself or others, or have thoughts about taking your own life, get help right away. You can go to your nearest emergency department or call:  Your local emergency services (911 in the [place]  A suicide crisis helpline, such as the [facility] at 581 077 4844. This is open 24 hours a day. Summary  Complicated grief is a severe type of grief that lasts for a long time. This grief is not likely to go away on its own. Get the help you need.  Some griefs are more difficult than others and can cause this condition. You may need a certain type of treatment to help you recover if the loss of your loved one was sudden, violent, or due to suicide.  You may feel guilty about moving on with your life. Getting help does not mean that you are forgetting your loved one. It means that you are taking care of yourself.  Complicated grief is best treated with talk therapy. Medicines may also be prescribed.  Seek the help you need, and find support that will help you recover. This information is not intended to replace advice given to you by your health care provider. Make sure you discuss any questions you have  with your health care provider. Document Revised: 03/03/2020 Document Reviewed: 03/03/2020 Elsevier Patient Education  [employer].

## 2020-11-02 NOTE — Telephone Encounter (Signed)
Pt states that Dr. Jonni Sanger mentioned putting pt on an antidepressant. Pt didn't see one on the AVS

## 2020-11-07 DIAGNOSIS — L600 Ingrowing nail: Secondary | ICD-10-CM | POA: Diagnosis not present

## 2020-11-07 DIAGNOSIS — M21611 Bunion of right foot: Secondary | ICD-10-CM | POA: Diagnosis not present

## 2020-11-07 DIAGNOSIS — M21612 Bunion of left foot: Secondary | ICD-10-CM | POA: Diagnosis not present

## 2020-11-07 NOTE — Telephone Encounter (Signed)
We discussed at her office visit and pt was hesitant to start; so we were going to start with counseling and then go from there. IF counselor and pt feel medications are then needed, she can follow up in office to further discuss and choose medication.

## 2020-11-07 NOTE — Telephone Encounter (Signed)
I was wondering if we ever sent in an antidepressant for patient.

## 2020-11-08 DIAGNOSIS — M25552 Pain in left hip: Secondary | ICD-10-CM | POA: Diagnosis not present

## 2020-11-08 NOTE — Telephone Encounter (Signed)
Spoke with patient and she is aware of instructions. She will call in to make appointment with them.

## 2020-11-15 ENCOUNTER — Other Ambulatory Visit: Payer: Self-pay | Admitting: Family Medicine

## 2020-11-21 DIAGNOSIS — M65872 Other synovitis and tenosynovitis, left ankle and foot: Secondary | ICD-10-CM | POA: Diagnosis not present

## 2020-11-21 DIAGNOSIS — G5761 Lesion of plantar nerve, right lower limb: Secondary | ICD-10-CM | POA: Diagnosis not present

## 2020-11-21 DIAGNOSIS — M7751 Other enthesopathy of right foot: Secondary | ICD-10-CM | POA: Diagnosis not present

## 2020-11-23 ENCOUNTER — Encounter: Payer: Medicare Other | Admitting: Family Medicine

## 2020-11-29 ENCOUNTER — Ambulatory Visit (INDEPENDENT_AMBULATORY_CARE_PROVIDER_SITE_OTHER): Payer: Medicare Other | Admitting: Family Medicine

## 2020-11-29 ENCOUNTER — Telehealth: Payer: Self-pay

## 2020-11-29 ENCOUNTER — Encounter: Payer: Self-pay | Admitting: Family Medicine

## 2020-11-29 ENCOUNTER — Other Ambulatory Visit: Payer: Self-pay

## 2020-11-29 VITALS — BP 148/92 | HR 72 | Temp 97.2°F | Ht 62.0 in | Wt 193.6 lb

## 2020-11-29 DIAGNOSIS — Z780 Asymptomatic menopausal state: Secondary | ICD-10-CM

## 2020-11-29 DIAGNOSIS — E782 Mixed hyperlipidemia: Secondary | ICD-10-CM | POA: Diagnosis not present

## 2020-11-29 DIAGNOSIS — I10 Essential (primary) hypertension: Secondary | ICD-10-CM

## 2020-11-29 DIAGNOSIS — F321 Major depressive disorder, single episode, moderate: Secondary | ICD-10-CM

## 2020-11-29 DIAGNOSIS — K580 Irritable bowel syndrome with diarrhea: Secondary | ICD-10-CM

## 2020-11-29 DIAGNOSIS — R053 Chronic cough: Secondary | ICD-10-CM

## 2020-11-29 DIAGNOSIS — K219 Gastro-esophageal reflux disease without esophagitis: Secondary | ICD-10-CM | POA: Diagnosis not present

## 2020-11-29 LAB — COMPREHENSIVE METABOLIC PANEL
ALT: 22 U/L (ref 0–35)
AST: 15 U/L (ref 0–37)
Albumin: 3.9 g/dL (ref 3.5–5.2)
Alkaline Phosphatase: 92 U/L (ref 39–117)
BUN: 23 mg/dL (ref 6–23)
CO2: 30 mEq/L (ref 19–32)
Calcium: 9.2 mg/dL (ref 8.4–10.5)
Chloride: 104 mEq/L (ref 96–112)
Creatinine, Ser: 0.86 mg/dL (ref 0.40–1.20)
GFR: 68.69 mL/min (ref >60.00)
Glucose, Bld: 83 mg/dL (ref 70–99)
Potassium: 4.7 mEq/L (ref 3.5–5.1)
Sodium: 139 mEq/L (ref 135–145)
Total Bilirubin: 0.9 mg/dL (ref 0.2–1.2)
Total Protein: 6.5 g/dL (ref 6.0–8.3)

## 2020-11-29 LAB — CBC WITH DIFFERENTIAL/PLATELET
Basophils Absolute: 0.1 10*3/uL (ref 0.0–0.1)
Basophils Relative: 0.9 % (ref 0.0–3.0)
Eosinophils Absolute: 0.1 10*3/uL (ref 0.0–0.7)
Eosinophils Relative: 2.5 % (ref 0.0–5.0)
HCT: 41.3 % (ref 36.0–46.0)
Hemoglobin: 14 g/dL (ref 12.0–15.0)
Lymphocytes Relative: 31.5 % (ref 12.0–46.0)
Lymphs Abs: 1.9 10*3/uL (ref 0.7–4.0)
MCHC: 33.8 g/dL (ref 30.0–36.0)
MCV: 91 fl (ref 78.0–100.0)
Monocytes Absolute: 0.4 10*3/uL (ref 0.1–1.0)
Monocytes Relative: 6.7 % (ref 3.0–12.0)
Neutro Abs: 3.5 10*3/uL (ref 1.4–7.7)
Neutrophils Relative %: 58.4 % (ref 43.0–77.0)
Platelets: 231 10*3/uL (ref 150.0–400.0)
RBC: 4.54 Mil/uL (ref 3.87–5.11)
RDW: 14.2 % (ref 11.5–15.5)
WBC: 5.9 10*3/uL (ref 4.0–10.5)

## 2020-11-29 LAB — LIPID PANEL
Cholesterol: 180 mg/dL (ref 0–200)
HDL: 71 mg/dL (ref >39.00)
LDL Cholesterol: 98 mg/dL (ref 0–99)
NonHDL: 109.13
Total CHOL/HDL Ratio: 3
Triglycerides: 56 mg/dL (ref 0.0–149.0)
VLDL: 11.2 mg/dL (ref 0.0–40.0)

## 2020-11-29 LAB — TSH: TSH: 2.56 u[IU]/mL (ref 0.35–4.50)

## 2020-11-29 MED ORDER — ALPRAZOLAM 0.5 MG PO TABS
0.5000 mg | ORAL_TABLET | Freq: Every evening | ORAL | 1 refills | Status: DC | PRN
Start: 2020-11-29 — End: 2021-03-02

## 2020-11-29 MED ORDER — LOSARTAN POTASSIUM 100 MG PO TABS: 100.0000 mg | ORAL_TABLET | Freq: Every day | ORAL | 3 refills | Status: DC

## 2020-11-29 NOTE — Progress Notes (Signed)
Subjective  Chief Complaint  Patient presents with  . Annual Exam  . Hypertension  . Osteoarthritis    HPI: Erin West is a 70 y.o. female who presents to [facility] [place] today for a Female Wellness Visit. She also has the concerns and/or needs as listed above in the chief complaint. These will be addressed in addition to the Health Maintenance Visit.   Wellness Visit: annual visit with health maintenance review and exam without Pap   Health maintenance: Mammogram and bone density are both due this month.  Colon cancer screening is up-to-date.  Immunizations are up-to-date. Chronic disease f/u and/or acute problem visit: (deemed necessary to be done in addition to the wellness visit):  Depression: Follow-up from last month: Patient reports she is doing better.  Not exactly sure why.  Decided not to do counseling because she does not want to do it virtually.  Believes Xanax has helped with anxiety and sleep.  Using almost nightly.  No longer having crying spells.  Chronic persistent cough: LP are, GERD: Had follow-up with GI who changed to Protonix.  Thinks this is helping.  Continues on allergy medications.  Still with cough but not as bothersome.  No new symptoms.  Hyperlipidemia on statin that is well-tolerated.  Fasting for recheck today.  Hypertension: Elevated readings persist.  No chest pain, shortness of breath or palpitations.  No lower extremity edema.  She is on losartan 50 mg daily and low-dose Coreg.  Assessment  1. Essential hypertension   2. Chronic cough   3. Laryngopharyngeal reflux (LPR)   4. Irritable bowel syndrome with diarrhea   5. Mixed hyperlipidemia   6. Depression, major, single episode, moderate ([place])   7. Asymptomatic menopausal state      Plan  Female Wellness Visit:  Age appropriate Health Maintenance and Prevention measures were discussed with patient. Included topics are cancer screening recommendations, ways to keep  healthy (see AVS) including dietary and exercise recommendations, regular eye and dental care, use of seat belts, and avoidance of moderate alcohol use and tobacco use.  Mammogram and bone density ordered.  BMI: discussed patient's BMI and encouraged positive lifestyle modifications to help get to or maintain a target BMI.  HM needs and immunizations were addressed and ordered. See below for orders. See HM and immunization section for updates.  Routine labs and screening tests ordered including cmp, cbc and lipids where appropriate.  Discussed recommendations regarding Vit D and calcium supplementation (see AVS)  Chronic disease management visit and/or acute problem visit:  Essential hypertension: Marginal control.  Increase losartan to 100 mg daily.  Continue Coreg 25 mg twice daily.  Recheck 3 months.  Hyperlipidemia: Check lipid panel and liver test.  Goal LDL less than 100.  Tolerating well.  Adjust statin dose if indicated.  GI: Per Dr. Collene Mares.  Adjusted medications.  Mildly improved.  Persistent cough: Multifactorial.  Tolerable.  Depression: Reactive grief, patient reports she is doing better.  We will continue with Xanax as needed versus repeat anxiety.  Warned against chronic use.  Monitor sleep.  Monitor mood.  No further intervention at this time.  Recheck 3 months.   Follow up: 3 months for follow-up blood pressure and mood Orders Placed This Encounter  Procedures  . DG Bone Density  . CBC with Differential/Platelet  . Comprehensive metabolic panel  . Lipid panel  . TSH   Meds ordered this encounter  Medications  . losartan (COZAAR) 100 MG tablet  Sig: Take 1 tablet (100 mg total) by mouth daily.    Dispense:  90 tablet    Refill:  3    Increasing dose up from 50mg  daily. Thanks.  . ALPRAZolam (XANAX) 0.5 MG tablet    Sig: Take 1 tablet (0.5 mg total) by mouth at bedtime as needed for anxiety.    Dispense:  30 tablet    Refill:  1      Body mass index is  35.41 kg/m. Wt Readings from Last 3 Encounters:  11/29/20 193 lb 9.6 oz (87.8 kg)  11/02/20 188 lb 9.6 oz (85.5 kg)  10/21/20 191 lb 6.4 oz (86.8 kg)     Patient Active Problem List   Diagnosis Date Noted  . Depression, major, single episode, moderate ([place]) 11/02/2020    Priority: High  . Mixed hyperlipidemia 12/04/2017    Priority: High    Last Assessment & Plan:  Compliant with statin, recheck today   . Chronic cough 07/24/2017    Priority: High     Persistent cough.  Ongoing for almost a year now.  Has seen ENT, pulmonology, gastroenterology without any clear diagnosis.  She reports worsening of cough in the morning with a.m. hoarseness and drainage.  She does have allergies.  She has seen an allergist many many years ago.  On Flonase and Zyrtec nightly.  CT scan of the sinuses was negative for infection.  PFTs were normal.  Chest x-ray was normal.  She is on chronic high-dose PPI and has been evaluated for esophageal dysmotility by Dr. Collene Mares.  They have not found a cause for her persistent cough.  She is on losartan Pulmonary consult January 2019, PFT normal No change off ARB (losartan).     . Essential hypertension     Priority: High    Overview:  Normal Stress Test 2012 by [facility]; ECHO EF 65% with mild diastolic dysfunction 03/6159     . OAB (overactive bladder) 10/28/2019    Priority: Medium  . Chronic seasonal allergic rhinitis 12/04/2017    Priority: Medium  . Chronic pansinusitis 09/09/2017    Priority: Medium  . Laryngopharyngeal reflux (LPR) 09/09/2017    Priority: Medium  . Bilateral leg edema 03/30/2014    Priority: Medium    Overview:  Normal labs; ECHO with EF 73% and mild diastolic dysfunction; doppler pending on left.  Lasix 40 bid to manage in high humidity. Does fine during winter. TED hose   . History of gastritis 02/10/2014    Priority: Medium    Overview:  01/2014 dr. Collene Mares   . Dysphagia 01/28/2014    Priority: Medium    Overview:  Dr. Collene Mares -  esophagus dysmobility and reflux on barium swallow.  Last Assessment & Plan:  Patient is reporting some dysphasia and acid reflux. I recommended she increased her omeprazole to 20 mg twice daily and schedule follow-up with GI   . Irritable bowel syndrome (IBS) 09/08/2013    Priority: Medium    Overview:  evaluation Dr. Collene Mares 2015 upper endoscopy with diffuse gastritis. Stopped nsaids.   . Osteopenia 07/14/2013    Priority: Medium    DEXA 11/2018: stable osteopenia, T = -1.5 lowest femer; recheck 2-3 years. DEXA 2014, mild osteopenia, T = -1.56 lowest (almost normal); on calcium and vit D; + Fh of osteoporosis   . Osteoarthritis, multiple sites 07/14/2013    Priority: Medium    Overview:  Followed by [facility]; MRI done in past - DJD and spurs. S/p knee replacement.  Dr. Jaynee Eagles  Hand injection:  Injections 2018, Dr. Lindell Noe   . Gastroesophageal reflux disease without esophagitis     Priority: Medium  . Atrophic vaginitis 10/22/2018    Priority: Low  . Status post total right knee replacement 01/24/2016    Priority: Low   Health Maintenance  Topic Date Due  . DEXA SCAN  11/25/2020  . MAMMOGRAM  12/08/2020  . TETANUS/TDAP  12/18/2025  . COLONOSCOPY (Pts 45-41yrs Insurance coverage will need to be confirmed)  08/03/2026  . INFLUENZA VACCINE  Completed  . COVID-19 Vaccine  Completed  . Hepatitis C Screening  Completed  . PNA vac Low Risk Adult  Completed  . HPV VACCINES  Aged Out   Immunization History  Administered Date(s) Administered  . Influenza Split 06/24/2013  . Influenza, High Dose Seasonal PF 05/23/2016, 06/28/2017, 06/12/2018, 05/17/2019  . Influenza, Seasonal, Injecte, Preservative Fre 05/27/2014, 07/11/2015  . Influenza,inj,quad, With Preservative 06/25/2019  . PFIZER(Purple Top)SARS-COV-2 Vaccination 10/30/2019, 11/24/2019, 06/22/2020  . Pneumococcal Conjugate-13 10/29/2016  . Pneumococcal Polysaccharide-23 07/25/2012, 11/06/2017, 05/17/2019  . Tdap  09/24/2010, 12/19/2015  . Zoster 09/25/2011  . Zoster Recombinat (Shingrix) 08/09/2017, 11/06/2017   We updated and reviewed the patient's past history in detail and it is documented below. Allergies: Patient is allergic to penicillins, sulfa antibiotics, sulfasalazine, tramadol, ciprofloxacin, trimethoprim, and isovue [iopamidol]. Past Medical History Patient  has a past medical history of Anxiety, Arthritis, Aspiration pneumonia ([place]) (2013), Atypical chest pain, Diarrhea, Diastolic dysfunction, DVT (deep venous thrombosis) ([place]), Dyslipidemia, GERD (gastroesophageal reflux disease), Hypertension, Osteoarthritis, Osteopenia, Palpitations, and Syncope. Past Surgical History Patient  has a past surgical history that includes Cholecystectomy (2006); Tubal ligation (06/1978); Replacement total knee (Left); Knee arthroscopy (Right); and Replacement total knee (Right). Family History: Patient family history includes Arthritis in her father and mother; Heart disease in her mother; Mental illness in her father; Osteoporosis in her mother; Other in her mother; Stroke in her paternal grandfather; Sudden death in her father; Thyroid disease in her father. Social History:  Patient  reports that she has never smoked. She has never used smokeless tobacco. She reports current alcohol use. She reports that she does not use drugs.  Review of Systems: Constitutional: negative for fever or malaise Ophthalmic: negative for photophobia, double vision or loss of vision Cardiovascular: negative for chest pain, dyspnea on exertion, or new LE swelling Respiratory: negative for SOB or persistent cough Gastrointestinal: negative for abdominal pain, change in bowel habits or melena Genitourinary: negative for dysuria or gross hematuria, no abnormal uterine bleeding or disharge Musculoskeletal: negative for new gait disturbance or muscular weakness Integumentary: negative for new or persistent rashes, no breast  lumps Neurological: negative for TIA or stroke symptoms Psychiatric: negative for SI or delusions Allergic/Immunologic: negative for hives  Patient Care Team    Relationship Specialty Notifications Start End  Leamon Arnt, MD PCP - General Family Medicine  03/19/16   Donzetta Sprung., MD Consulting Physician Orthopedic Surgery  12/04/17   Daryll Brod, [place] Physician Orthopedic Surgery  12/04/17   Juanita Craver, MD Consulting Physician Gastroenterology  12/04/17   Inocencio Homes, [place] Consulting Physician Podiatry  10/22/18   [name], MD Consulting Physician Cardiology  10/22/18   Gregery Na, NP  Nurse Practitioner  10/22/18   Bjorn Loser, MD Consulting Physician Urology  10/28/19   Harlow Ohms Consulting Physician Dermatology  10/29/19     Objective  Vitals: BP (!) 148/92   Pulse 72   Temp Marland Kitchen)  97.2 F (36.2 C) (Temporal)   Ht 5\' 2"  (1.575 m)   Wt 193 lb 9.6 oz (87.8 kg)   LMP 09/24/2012 (Approximate)   SpO2 96%   BMI 35.41 kg/m  General:  Well developed, well nourished, no acute distress  Psych:  Alert and orientedx3,normal mood and affect HEENT:  Normocephalic, atraumatic, non-icteric sclera,  supple neck without adenopathy, mass or thyromegaly Cardiovascular:  Normal S1, S2, RRR without gallop, rub or murmur, no edema Respiratory:  Good breath sounds bilaterally, CTAB with normal respiratory effort Gastrointestinal: normal bowel sounds, soft, non-tender, no noted masses. No HSM    Commons side effects, risks, benefits, and alternatives for medications and treatment plan prescribed today were discussed, and the patient expressed understanding of the given instructions. Patient is instructed to call or message via MyChart if he/she has any questions or concerns regarding our treatment plan. No barriers to understanding were identified. We discussed Red Flag symptoms and signs in detail. Patient expressed understanding regarding what to do in  case of urgent or emergency type symptoms.   Medication list was reconciled, printed and provided to the patient in AVS. Patient instructions and summary information was reviewed with the patient as documented in the AVS. This note was prepared with assistance of Dragon voice recognition software. Occasional wrong-word or sound-a-like substitutions may have occurred due to the inherent limitations of voice recognition software  This visit occurred during the SARS-CoV-2 public health emergency.  Safety protocols were in place, including screening questions prior to the visit, additional usage of staff PPE, and extensive cleaning of exam room while observing appropriate contact time as indicated for disinfecting solutions.

## 2020-11-29 NOTE — Telephone Encounter (Signed)
Patient is calling in requesting a referral to dermatology, states she forgot to ask earlier.

## 2020-11-29 NOTE — Patient Instructions (Signed)
Please return in 3 months to recheck blood pressure and mood.  I will release your lab results to you on your MyChart account with further instructions. Please reply with any questions.    If you have any questions or concerns, please don't hesitate to send me a message via MyChart or call the office at [phone]. Thank you for visiting with [place] today! It's our pleasure caring for you.

## 2020-11-30 ENCOUNTER — Other Ambulatory Visit: Payer: Self-pay

## 2020-11-30 DIAGNOSIS — D229 Melanocytic nevi, unspecified: Secondary | ICD-10-CM

## 2020-11-30 NOTE — Telephone Encounter (Signed)
Referral sent 

## 2020-12-07 DIAGNOSIS — M7671 Peroneal tendinitis, right leg: Secondary | ICD-10-CM | POA: Diagnosis not present

## 2020-12-07 DIAGNOSIS — G5761 Lesion of plantar nerve, right lower limb: Secondary | ICD-10-CM | POA: Diagnosis not present

## 2020-12-09 DIAGNOSIS — M25552 Pain in left hip: Secondary | ICD-10-CM | POA: Diagnosis not present

## 2020-12-14 ENCOUNTER — Encounter: Payer: Self-pay | Admitting: Family Medicine

## 2020-12-14 DIAGNOSIS — M8589 Other specified disorders of bone density and structure, multiple sites: Secondary | ICD-10-CM | POA: Diagnosis not present

## 2020-12-14 DIAGNOSIS — Z1231 Encounter for screening mammogram for malignant neoplasm of breast: Secondary | ICD-10-CM | POA: Diagnosis not present

## 2020-12-14 LAB — HM DEXA SCAN

## 2020-12-19 ENCOUNTER — Other Ambulatory Visit: Payer: Self-pay | Admitting: Sports Medicine

## 2020-12-19 DIAGNOSIS — M25552 Pain in left hip: Secondary | ICD-10-CM

## 2020-12-29 ENCOUNTER — Other Ambulatory Visit: Payer: Self-pay

## 2020-12-29 ENCOUNTER — Telehealth: Payer: Self-pay

## 2020-12-29 NOTE — Telephone Encounter (Signed)
MEDICATION: Alprazolam 0.5 mG  PHARMACY: [facility] [address]  Comments: pt states the pharmacy does not have the prescription from 11/29/2020  **Let patient know to contact pharmacy at the end of the day to make sure medication is ready. **  ** Please notify patient to allow 48-72 hours to process**  **Encourage patient to contact the pharmacy for refills or they can request refills through [facility]**

## 2020-12-30 NOTE — Telephone Encounter (Signed)
Confirmed with pharmacy that patient picked up medication

## 2021-01-03 ENCOUNTER — Encounter: Payer: Self-pay | Admitting: Family Medicine

## 2021-01-03 ENCOUNTER — Telehealth: Payer: Self-pay

## 2021-01-03 NOTE — Telephone Encounter (Signed)
DEXA report: based on the WHO criteria this patient has OSTEOPENIA. Recommend f/u 2 yrs.

## 2021-01-03 NOTE — Telephone Encounter (Signed)
Lowest site measured: right femoral neck BMD: 0.673 T-score: -1.6

## 2021-01-04 DIAGNOSIS — M25552 Pain in left hip: Secondary | ICD-10-CM | POA: Diagnosis not present

## 2021-01-05 DIAGNOSIS — M25552 Pain in left hip: Secondary | ICD-10-CM | POA: Diagnosis not present

## 2021-01-24 NOTE — Telephone Encounter (Signed)
Can notify pt that her dexa shows stable mild bone loss. Chan recheck in 2 years. Continue calcium/vit d and weight bearing exercise/

## 2021-01-30 ENCOUNTER — Ambulatory Visit: Payer: Medicare Other | Admitting: Family Medicine

## 2021-02-06 ENCOUNTER — Other Ambulatory Visit: Payer: Self-pay

## 2021-02-06 ENCOUNTER — Telehealth (INDEPENDENT_AMBULATORY_CARE_PROVIDER_SITE_OTHER): Payer: Medicare Other | Admitting: Family Medicine

## 2021-02-06 ENCOUNTER — Encounter: Payer: Self-pay | Admitting: Family Medicine

## 2021-02-06 ENCOUNTER — Telehealth: Payer: Self-pay

## 2021-02-06 DIAGNOSIS — J208 Acute bronchitis due to other specified organisms: Secondary | ICD-10-CM

## 2021-02-06 DIAGNOSIS — J9801 Acute bronchospasm: Secondary | ICD-10-CM | POA: Diagnosis not present

## 2021-02-06 DIAGNOSIS — B9689 Other specified bacterial agents as the cause of diseases classified elsewhere: Secondary | ICD-10-CM

## 2021-02-06 MED ORDER — ALBUTEROL SULFATE HFA 108 (90 BASE) MCG/ACT IN AERS: 2.0000 | INHALATION_SPRAY | RESPIRATORY_TRACT | 2 refills | Status: DC | PRN

## 2021-02-06 MED ORDER — AZITHROMYCIN 250 MG PO TABS: ORAL_TABLET | ORAL | 0 refills | Status: DC

## 2021-02-06 NOTE — Telephone Encounter (Signed)
Caller had a dental appt and during appt she had to stop them because her throat started burning. Next morning her voice sounded bad and she has a terrible cough. Side of her tongue is also sore and she is having trouble swallowing. She is concerned for aspiration pneumonia. Translation No Nurse Assessment Nurse: Ferdinand Lango, RN, Kenney Houseman Date/Time (Eastern Time): 02/05/2021 1:37:23 PM Confirm and document reason for call. If symptomatic, describe symptoms. ---Caller states she has a bad cough, throat scratchy, and earache. The cough is productive with green/ yellow phlegm. Denies fever or any other issues at this time. Does the patient have any new or worsening symptoms? ---Yes Will a triage be completed? ---Yes Related visit to physician within the last 2 weeks? ---No Does the PT have any chronic conditions? (i.e. diabetes, asthma, this includes High risk factors for pregnancy, etc.) ---Yes List chronic conditions. ---HTN, High Cholesterol, GERD Is this a behavioral health or substance abuse call? ---No Guidelines Guideline Title Affirmed Question Affirmed Notes Nurse Date/Time Eilene Ghazi Time) Common Cold Chad Cordial, RN, Kenney Houseman 02/05/2021 1:40:34 PM PLEASE NOTE: All timestamps contained within this report are represented as [place] Standard Time. CONFIDENTIALTY NOTICE: This fax transmission is intended only for the addressee. It contains information that is legally privileged, confidential or otherwise protected from use or disclosure. If you are not the intended recipient, you are strictly prohibited from reviewing, disclosing, copying using or disseminating any of this information or taking any action in reliance on or regarding this information. If you have received this fax in error, please notify us immediately by telephone so that we can arrange for its return to [place]. Phone: [phone], Toll-Free: [phone], Fax: [phone] Page: 2 of 2 Call Id: [id number] [place]. Time  Eilene Ghazi Time) Disposition Final User 02/05/2021 1:43:35 PM See PCP within 24 Hours Yes Ferdinand Lango, RN, Kenney Houseman Caller Disagree/Comply Comply Caller Understands Yes PreDisposition Did not know what to do Care Advice Given Per Guideline SEE PCP WITHIN 24 HOURS: * IF OFFICE WILL BE CLOSED: You need to be seen within the next 24 hours. A clinic or an urgent care center is often a good source of care if your doctor's office is closed or you can't get an appointment. * IF OFFICE WILL BE OPEN: You need to be examined within the next 24 hours. Call your doctor (or NP/PA) when the office opens and make an appointment. PAIN MEDICINES: * IBUPROFEN (E.G., MOTRIN, ADVIL): Take 400 mg (two 200 mg pills) by mouth every 6 hours. The most you should take each day is 1,200 mg (six 200 mg pills), unless your doctor has told you to take more. * NAPROXEN (E.G., ALEVE): Take 220 mg (one 220 mg pill) by mouth every 8 to 12 hours as needed. You may take 440 mg (two 220 mg pills) for your first dose. The most you should take each day is 660 mg (three 220 mg pills a day), unless your doctor has told you to take more. * ACETAMINOPHEN - EXTRA STRENGTH TYLENOL: Take 1,000 mg (two 500 mg pills) every 8 hours as needed. Each Extra Strength Tylenol pill has 500 mg of acetaminophen. The most you should take each day is 3,000 mg (6 pills a day). HUMIDIFIER: * If the air is dry, use a humidifier in the bedroom. * Dry air makes coughs worse. CALL BACK IF: * Fever over 104 F (40 C) * You have difficulty breathing * You become worse CARE ADVICE given per Common Cold (Adult) guideline. Referrals REFERRED TO PCP OFFICE

## 2021-02-06 NOTE — Progress Notes (Signed)
Virtual Visit via Video Note  Subjective  CC:  Chief Complaint  Patient presents with  . Cough    Dry cough since Thursday. Scratchy. Had a dentist appointment and cough developed after  . Wheezing  . Ear Pain    Left ear pain, before it started hurting states she could hear water in ear     I connected with Cheron Every on 02/06/21 at  3:30 PM EDT by a video enabled telemedicine application and verified that I am speaking with the correct person using two identifiers. Location patient: Home Location provider: [facility] [place], Office Persons participating in the virtual visit: Erin West, Leamon Arnt, MD [place]  I discussed the limitations of evaluation and management by telemedicine and the availability of in person appointments. The patient expressed understanding and agreed to proceed. HPI: Erin West is a 70 y.o. female who was contacted today to address the problems listed above in the chief complaint. . 70 year old female complains of 3-day history of hacking productive cough associated with left ear pain.  Also has had some wheezing.  Denies fevers, chills, pleuritic chest pain, GI symptoms.  She checked a home COVID test 2 days ago and it was negative.  She is fully vaccinated against COVID.  She has history of bronchospasm and laryngopharyngeal reflux with chronic cough syndrome.  She has been on a PPI for this.  She has malaise.  Mild myalgias.  No rash.   Assessment  1. Acute bacterial bronchitis   2. Bronchospasm      Plan   Infectious cough, presumed: Recommend repeat COVID test tomorrow in our after-hours clinic to be certain.  For now treat with Z-Pak and albuterol inhaler.  Patient defers prednisone due to side effects of keeping her up at night.  Supportive care with over-the-counter cough medicines and decongestants as needed.  Good hydration and rest.  Follow-up if not improving.  Can have an office visit if  COVID test returns positive and symptoms persist.  She does have chronic cough syndrome and likely symptoms will last days to weeks. I discussed the assessment and treatment plan with the patient. The patient was provided an opportunity to ask questions and all were answered. The patient agreed with the plan and demonstrated an understanding of the instructions.   The patient was advised to call back or seek an in-person evaluation if the symptoms worsen or if the condition fails to improve as anticipated. Follow up: [facility] for testing tomorrow.  Otherwise as needed 03/02/2021  Meds ordered this encounter  Medications  . albuterol (VENTOLIN HFA) 108 (90 Base) MCG/ACT inhaler    Sig: Inhale 2 puffs into the lungs every 4 (four) hours as needed for wheezing or shortness of breath.    Dispense:  1 each    Refill:  2  . azithromycin (ZITHROMAX) 250 MG tablet    Sig: Take 2 tabs today, then 1 tab daily for 4 days    Dispense:  1 each    Refill:  0      I reviewed the patients updated PMH, FH, and SocHx.    Patient Active Problem List   Diagnosis Date Noted  . Depression, major, single episode, moderate ([place]) 11/02/2020    Priority: High  . Mixed hyperlipidemia 12/04/2017    Priority: High  . Chronic cough 07/24/2017    Priority: High  . Essential hypertension     Priority:  High  . OAB (overactive bladder) 10/28/2019    Priority: Medium  . Chronic seasonal allergic rhinitis 12/04/2017    Priority: Medium  . Chronic pansinusitis 09/09/2017    Priority: Medium  . Laryngopharyngeal reflux (LPR) 09/09/2017    Priority: Medium  . Bilateral leg edema 03/30/2014    Priority: Medium  . History of gastritis 02/10/2014    Priority: Medium  . Dysphagia 01/28/2014    Priority: Medium  . Irritable bowel syndrome (IBS) 09/08/2013    Priority: Medium  . Osteopenia 07/14/2013    Priority: Medium  . Osteoarthritis, multiple sites 07/14/2013    Priority: Medium  . Gastroesophageal  reflux disease without esophagitis     Priority: Medium  . Atrophic vaginitis 10/22/2018    Priority: Low  . Status post total right knee replacement 01/24/2016    Priority: Low   Current Meds  Medication Sig  . acetaminophen (TYLENOL) 650 MG CR tablet Take 650 mg by mouth every 8 (eight) hours as needed for pain.  Marland Kitchen ALPRAZolam (XANAX) 0.5 MG tablet Take 1 tablet (0.5 mg total) by mouth at bedtime as needed for anxiety.  Marland Kitchen atorvastatin (LIPITOR) 20 MG tablet TAKE 1 TABLET DAILY  . azelastine (ASTELIN) 0.1 % nasal spray Place 2 sprays into both nostrils 2 (two) times daily. Use in each nostril as directed (Patient taking differently: Place 2 sprays into both nostrils 2 (two) times daily as needed for rhinitis. Use in each nostril as directed)  . azithromycin (ZITHROMAX) 250 MG tablet Take 2 tabs today, then 1 tab daily for 4 days  . carvedilol (COREG) 25 MG tablet Take 1 tablet (25 mg total) by mouth 2 (two) times daily.  . cetirizine (ZYRTEC) 10 MG tablet Take 1 tablet (10 mg total) by mouth daily.  . Cholecalciferol (VITAMIN D) 2000 UNITS CAPS Take 2,000 Units by mouth daily.   Marland Kitchen losartan (COZAAR) 100 MG tablet Take 1 tablet (100 mg total) by mouth daily.  . montelukast (SINGULAIR) 10 MG tablet Take 1 tablet (10 mg total) by mouth at bedtime.  . Multiple Vitamin (MULTIVITAMIN) tablet Take 1 tablet by mouth daily.  . pantoprazole (PROTONIX) 20 MG tablet Take 1 tablet (20 mg total) by mouth daily.  . polyethylene glycol powder (GLYCOLAX/MIRALAX) powder Take 17 g by mouth daily as needed. (Patient taking differently: Take 17 g by mouth daily as needed for mild constipation.)  . Probiotic Product (PROBIOTIC PO) Take 1 capsule by mouth daily.   . [DISCONTINUED] albuterol (PROVENTIL HFA;VENTOLIN HFA) 108 (90 Base) MCG/ACT inhaler Inhale 2 puffs into the lungs every 4 (four) hours as needed for wheezing or shortness of breath.    Allergies: Patient is allergic to penicillins, sulfa antibiotics,  sulfasalazine, tramadol, ciprofloxacin, trimethoprim, and isovue [iopamidol]. Family History: Patient family history includes Arthritis in her father and mother; Heart disease in her mother; Mental illness in her father; Osteoporosis in her mother; Other in her mother; Stroke in her paternal grandfather; Sudden death in her father; Thyroid disease in her father. Social History:  Patient  reports that she has never smoked. She has never used smokeless tobacco. She reports current alcohol use. She reports that she does not use drugs.  Review of Systems: Constitutional: Negative for fever malaise or anorexia Cardiovascular: negative for chest pain Respiratory: negative for SOB or persistent cough Gastrointestinal: negative for abdominal pain  OBJECTIVE Vitals: LMP 09/24/2012 (Approximate)  General: no acute distress , A&Ox3, cough is present.  Leamon Arnt, MD

## 2021-02-06 NOTE — Telephone Encounter (Signed)
FYI  Patient is scheduled for a virtual visit today.

## 2021-02-07 ENCOUNTER — Telehealth: Payer: Self-pay

## 2021-02-07 ENCOUNTER — Ambulatory Visit: Payer: Medicare Other | Attending: Critical Care Medicine

## 2021-02-07 DIAGNOSIS — Z20822 Contact with and (suspected) exposure to covid-19: Secondary | ICD-10-CM | POA: Diagnosis not present

## 2021-02-07 NOTE — Telephone Encounter (Signed)
Dr. Jonni Sanger, please see message.

## 2021-02-07 NOTE — Telephone Encounter (Signed)
Pt is asking if Dr. Jonni Sanger will send in a prescription of prednisone for her bronchitis. Pt states they discussed her taking the medication and she has now agreed to start taking it to help her feel better. Please advise.

## 2021-02-08 LAB — NOVEL CORONAVIRUS, NAA: SARS-CoV-2, NAA: NOT DETECTED

## 2021-02-08 LAB — SARS-COV-2, NAA 2 DAY TAT

## 2021-02-08 MED ORDER — PREDNISONE 10 MG PO TABS: ORAL_TABLET | ORAL | 0 refills | Status: DC

## 2021-02-09 DIAGNOSIS — R059 Cough, unspecified: Secondary | ICD-10-CM | POA: Diagnosis not present

## 2021-02-09 DIAGNOSIS — J208 Acute bronchitis due to other specified organisms: Secondary | ICD-10-CM | POA: Diagnosis not present

## 2021-02-09 DIAGNOSIS — R062 Wheezing: Secondary | ICD-10-CM | POA: Diagnosis not present

## 2021-02-13 DIAGNOSIS — R0602 Shortness of breath: Secondary | ICD-10-CM | POA: Diagnosis not present

## 2021-02-13 DIAGNOSIS — J208 Acute bronchitis due to other specified organisms: Secondary | ICD-10-CM | POA: Diagnosis not present

## 2021-02-13 DIAGNOSIS — R062 Wheezing: Secondary | ICD-10-CM | POA: Diagnosis not present

## 2021-02-13 DIAGNOSIS — R059 Cough, unspecified: Secondary | ICD-10-CM | POA: Diagnosis not present

## 2021-02-13 DIAGNOSIS — Z79899 Other long term (current) drug therapy: Secondary | ICD-10-CM | POA: Diagnosis not present

## 2021-02-16 DIAGNOSIS — D235 Other benign neoplasm of skin of trunk: Secondary | ICD-10-CM | POA: Diagnosis not present

## 2021-02-16 DIAGNOSIS — L821 Other seborrheic keratosis: Secondary | ICD-10-CM | POA: Diagnosis not present

## 2021-02-16 DIAGNOSIS — J208 Acute bronchitis due to other specified organisms: Secondary | ICD-10-CM | POA: Diagnosis not present

## 2021-02-16 DIAGNOSIS — H6983 Other specified disorders of Eustachian tube, bilateral: Secondary | ICD-10-CM | POA: Diagnosis not present

## 2021-02-16 DIAGNOSIS — L918 Other hypertrophic disorders of the skin: Secondary | ICD-10-CM | POA: Diagnosis not present

## 2021-02-16 DIAGNOSIS — R030 Elevated blood-pressure reading, without diagnosis of hypertension: Secondary | ICD-10-CM | POA: Diagnosis not present

## 2021-02-16 DIAGNOSIS — Z6834 Body mass index (BMI) 34.0-34.9, adult: Secondary | ICD-10-CM | POA: Diagnosis not present

## 2021-02-16 DIAGNOSIS — L570 Actinic keratosis: Secondary | ICD-10-CM | POA: Diagnosis not present

## 2021-02-16 DIAGNOSIS — R0982 Postnasal drip: Secondary | ICD-10-CM | POA: Diagnosis not present

## 2021-02-16 DIAGNOSIS — D485 Neoplasm of uncertain behavior of skin: Secondary | ICD-10-CM | POA: Diagnosis not present

## 2021-03-02 ENCOUNTER — Other Ambulatory Visit: Payer: Self-pay

## 2021-03-02 ENCOUNTER — Ambulatory Visit (INDEPENDENT_AMBULATORY_CARE_PROVIDER_SITE_OTHER): Payer: Medicare Other | Admitting: Family Medicine

## 2021-03-02 ENCOUNTER — Encounter: Payer: Self-pay | Admitting: Family Medicine

## 2021-03-02 VITALS — BP 124/90 | HR 84 | Temp 98.5°F | Ht 62.0 in | Wt 198.4 lb

## 2021-03-02 DIAGNOSIS — I10 Essential (primary) hypertension: Secondary | ICD-10-CM | POA: Diagnosis not present

## 2021-03-02 DIAGNOSIS — E782 Mixed hyperlipidemia: Secondary | ICD-10-CM | POA: Diagnosis not present

## 2021-03-02 DIAGNOSIS — F321 Major depressive disorder, single episode, moderate: Secondary | ICD-10-CM | POA: Diagnosis not present

## 2021-03-02 DIAGNOSIS — Z9889 Other specified postprocedural states: Secondary | ICD-10-CM

## 2021-03-02 MED ORDER — ALPRAZOLAM 0.5 MG PO TABS: 0.5000 mg | ORAL_TABLET | Freq: Every day | ORAL | 3 refills | Status: DC

## 2021-03-02 NOTE — Patient Instructions (Signed)
Please return in 6 months for hypertension follow up.   If you have any questions or concerns, please don't hesitate to send me a message via MyChart or call the office at [phone]. Thank you for visiting with [place] today! It's our pleasure caring for you.   Skin Biopsy, Care After This sheet gives you information about how to care for yourself after your procedure. Your health care provider may also give you more specific instructions. If you have problems or questions, contact your health care provider. What can I expect after the procedure? After the procedure, it is common to have: Soreness. Bruising. Itching. Follow these instructions at home: Biopsy site care Follow instructions from your health care provider about how to take care of your biopsy site. Make sure you: Wash your hands with soap and water before and after you change your bandage (dressing). If soap and water are not available, use hand sanitizer. Apply ointment on your biopsy site as directed by your health care provider. Change your dressing as told by your health care provider. Leave stitches (sutures), skin glue, or adhesive strips in place. These skin closures may need to stay in place for 2 weeks or longer. If adhesive strip edges start to loosen and curl up, you may trim the loose edges. Do not remove adhesive strips completely unless your health care provider tells you to do that. If the biopsy area bleeds, apply gentle pressure for 10 minutes. Check your biopsy site every day for signs of infection. Check for: Redness, swelling, or pain. Fluid or blood. Warmth. Pus or a bad smell.   General instructions Rest and then return to your normal activities as told by your health care provider. Take over-the-counter and prescription medicines only as told by your health care provider. Keep all follow-up visits as told by your health care provider. This is important. Contact a health care provider if: You have  redness, swelling, or pain around your biopsy site. You have fluid or blood coming from your biopsy site. Your biopsy site feels warm to the touch. You have pus or a bad smell coming from your biopsy site. You have a fever. Your sutures, skin glue, or adhesive strips loosen or come off sooner than expected. Get help right away if: You have bleeding that does not stop with pressure or a dressing. Summary After the procedure, it is common to have soreness, bruising, and itching at the site. Follow instructions from your health care provider about how to take care of your biopsy site. Check your biopsy site every day for signs of infection. Contact a health care provider if you have redness, swelling, or pain around your biopsy site, or your biopsy site feels warm to the touch. Keep all follow-up visits as told by your health care provider. This is important. This information is not intended to replace advice given to you by your health care provider. Make sure you discuss any questions you have with your health care provider. Document Revised: 03/10/2018 Document Reviewed: 03/10/2018 Elsevier Patient Education  [place].

## 2021-03-02 NOTE — Progress Notes (Addendum)
Subjective  CC:  Chief Complaint  Patient presents with   Depression   Hypertension   Hyperlipidemia    HPI: Erin West is a 70 y.o. female who presents to the office today to address the problems listed above in the chief complaint. Hypertension f/u: Control is good . Pt reports she is doing well. taking medications as instructed, no medication side effects noted, no TIAs, no chest pain on exertion, no dyspnea on exertion, no swelling of ankles. Her Losartan was increased from 50 mg to 100 mg in her last visit. She denies adverse effects from his BP medications. Compliance with medication is good. At the dentist office her blood pressure was approximately 120s/70s Depression-She is relying on her Xanax 0.5 mg to sleep. If she misses a dose she feels more depressed. Continues to grieve her husbands death. But overall is improved.  Bronchitis- Her cough and other symptoms have improved significantly. Feeling much better today.  Skin lesion- She underwent a skin procedure on May 25th, 2022 at [facility] to remove a possible pre-cancerous spot from her left upper breast region. She has been applying the antibiotic cream given by Dermatology.  Left shoulder pain-Plans to have shoulder surgery in future.   Assessment  1. Essential hypertension   2. Depression, major, single episode, moderate ([place])   3. Mixed hyperlipidemia   4. Status post biopsy of skin      Plan   Hypertension f/u: BP control is well controlled.  Continue losartan 100 mg daily and carvedilol 25 twice daily. Hyperlipidemia f/u: Well-controlled on statin.  LDL at goal. Depression and anxiety: Patient continues to grieve but overall is improved.  She remains hesitant to start an antidepressant.  She feels that if she takes Xanax nightly, her mood is much better.  Her emotional affect is less labile.  She sleeps well.  We discussed the risks and benefits.  She elects to continue nightly use.  Refill today. Status post punch  biopsy of the skin: Wound care instructions given.  If not improving, follow-up with dermatology.  Education regarding management of these chronic disease states was given. Management strategies discussed on successive visits include dietary and exercise recommendations, goals of achieving and maintaining IBW, and lifestyle modifications aiming for adequate sleep and minimizing stressors.   Follow up: Return in about 6 months (around 09/01/2021) for Follow up for recheck of HTN.  No orders of the defined types were placed in this encounter.  Meds ordered this encounter  Medications   ALPRAZolam (XANAX) 0.5 MG tablet    Sig: Take 1 tablet (0.5 mg total) by mouth at bedtime.    Dispense:  90 tablet    Refill:  3      BP Readings from Last 3 Encounters:  03/02/21 124/90  11/29/20 (!) 148/92  11/02/20 (!) 140/92   Wt Readings from Last 3 Encounters:  03/02/21 198 lb 6.4 oz (90 kg)  11/29/20 193 lb 9.6 oz (87.8 kg)  11/02/20 188 lb 9.6 oz (85.5 kg)    Lab Results  Component Value Date   CHOL 180 11/29/2020   CHOL 163 10/28/2019   CHOL 157 10/22/2018   Lab Results  Component Value Date   HDL 71.00 11/29/2020   HDL 53.00 10/28/2019   HDL 51.80 10/22/2018   Lab Results  Component Value Date   LDLCALC 98 11/29/2020   LDLCALC 94 10/28/2019   Edwards 90 10/22/2018   Lab Results  Component Value Date   TRIG 56.0 11/29/2020  TRIG 80.0 10/28/2019   TRIG 73.0 10/22/2018   Lab Results  Component Value Date   CHOLHDL 3 11/29/2020   CHOLHDL 3 10/28/2019   CHOLHDL 3 10/22/2018   No results found for: LDLDIRECT Lab Results  Component Value Date   CREATININE 0.86 11/29/2020   BUN 23 11/29/2020   NA 139 11/29/2020   K 4.7 11/29/2020   CL 104 11/29/2020   CO2 30 11/29/2020    The 10-year ASCVD risk score Mikey Bussing DC Jr., et al., 2013) is: 11%   Values used to calculate the score:     Age: 79 years     Sex: Female     Is Non-Hispanic African American: No     Diabetic:  No     Tobacco smoker: No     Systolic Blood Pressure: 956 mmHg     Is BP treated: Yes     HDL Cholesterol: 71 mg/dL     Total Cholesterol: 180 mg/dL  I reviewed the patients updated PMH, FH, and SocHx.    Patient Active Problem List   Diagnosis Date Noted   Depression, major, single episode, moderate ([place]) 11/02/2020    Priority: High   Mixed hyperlipidemia 12/04/2017    Priority: High   Chronic cough 07/24/2017    Priority: High   Essential hypertension     Priority: High   OAB (overactive bladder) 10/28/2019    Priority: Medium   Chronic seasonal allergic rhinitis 12/04/2017    Priority: Medium   Chronic pansinusitis 09/09/2017    Priority: Medium   Laryngopharyngeal reflux (LPR) 09/09/2017    Priority: Medium   Bilateral leg edema 03/30/2014    Priority: Medium   History of gastritis 02/10/2014    Priority: Medium   Dysphagia 01/28/2014    Priority: Medium   Irritable bowel syndrome (IBS) 09/08/2013    Priority: Medium   Osteopenia 07/14/2013    Priority: Medium   Osteoarthritis, multiple sites 07/14/2013    Priority: Medium   Gastroesophageal reflux disease without esophagitis     Priority: Medium   Atrophic vaginitis 10/22/2018    Priority: Low   Status post total right knee replacement 01/24/2016    Priority: Low    Allergies: Penicillins, Sulfa antibiotics, Sulfasalazine, Tramadol, Ciprofloxacin, Trimethoprim, and Isovue [iopamidol]  Social History: Patient  reports that she has never smoked. She has never used smokeless tobacco. She reports current alcohol use. She reports that she does not use drugs.  Current Meds  Medication Sig   acetaminophen (TYLENOL) 650 MG CR tablet Take 650 mg by mouth every 8 (eight) hours as needed for pain.   albuterol (VENTOLIN HFA) 108 (90 Base) MCG/ACT inhaler Inhale 2 puffs into the lungs every 4 (four) hours as needed for wheezing or shortness of breath.   atorvastatin (LIPITOR) 20 MG tablet TAKE 1 TABLET DAILY    azelastine (ASTELIN) 0.1 % nasal spray Place 2 sprays into both nostrils 2 (two) times daily. Use in each nostril as directed (Patient taking differently: Place 2 sprays into both nostrils 2 (two) times daily as needed for rhinitis. Use in each nostril as directed)   carvedilol (COREG) 25 MG tablet Take 1 tablet (25 mg total) by mouth 2 (two) times daily.   cetirizine (ZYRTEC) 10 MG tablet Take 1 tablet (10 mg total) by mouth daily.   Cholecalciferol (VITAMIN D) 2000 UNITS CAPS Take 2,000 Units by mouth daily.    losartan (COZAAR) 100 MG tablet Take 1 tablet (100 mg total) by mouth  daily.   montelukast (SINGULAIR) 10 MG tablet Take 1 tablet (10 mg total) by mouth at bedtime.   Multiple Vitamin (MULTIVITAMIN) tablet Take 1 tablet by mouth daily.   pantoprazole (PROTONIX) 20 MG tablet Take 1 tablet (20 mg total) by mouth daily.   polyethylene glycol powder (GLYCOLAX/MIRALAX) powder Take 17 g by mouth daily as needed. (Patient taking differently: Take 17 g by mouth daily as needed for mild constipation.)   Probiotic Product (PROBIOTIC PO) Take 1 capsule by mouth daily.    [DISCONTINUED] ALPRAZolam (XANAX) 0.5 MG tablet Take 1 tablet (0.5 mg total) by mouth at bedtime as needed for anxiety.    Review of Systems: Cardiovascular: negative for chest pain, palpitations, leg swelling, orthopnea Respiratory: negative for SOB, wheezing or persistent cough Gastrointestinal: negative for abdominal pain Genitourinary: negative for dysuria or gross hematuria  Objective  Vitals: BP 124/90   Pulse 84   Temp 98.5 F (36.9 C) (Temporal)   Ht 5\' 2"  (1.575 m)   Wt 198 lb 6.4 oz (90 kg)   LMP 09/24/2012 (Approximate)   SpO2 96%   BMI 36.29 kg/m  General: no acute distress  Psych:  Alert and oriented, flat mood and affect HEENT:  Normocephalic, atraumatic, supple neck  Cardiovascular:  RRR without murmur. no edema Respiratory:  Good breath sounds bilaterally, CTAB with normal respiratory effort Skin:   Warm, approximately 1.2 skin biopsy site on left breast with granulation tissue and scab present, surrounding erythema without warmth present.  No drainage or fluctuance. Neurologic:   Mental status is normal, flat affect Commons side effects, risks, benefits, and alternatives for medications and treatment plan prescribed today were discussed, and the patient expressed understanding of the given instructions. Patient is instructed to call or message via MyChart if he/she has any questions or concerns regarding our treatment plan. No barriers to understanding were identified. We discussed Red Flag symptoms and signs in detail. Patient expressed understanding regarding what to do in case of urgent or emergency type symptoms.  Medication list was reconciled, printed and provided to the patient in AVS. Patient instructions and summary information was reviewed with the patient as documented in the AVS. This note was prepared with assistance of Dragon voice recognition software. Occasional wrong-word or sound-a-like substitutions may have occurred due to the inherent limitations of voice recognition software  This visit occurred during the SARS-CoV-2 public health emergency.  Safety protocols were in place, including screening questions prior to the visit, additional usage of staff PPE, and extensive cleaning of exam room while observing appropriate contact time as indicated for disinfecting solutions.    I,Alexis Bryant,acting as a Education administrator for Leamon Arnt, MD.,have documented all relevant documentation on the behalf of Leamon Arnt, MD,as directed by  Leamon Arnt, MD while in the presence of Leamon Arnt, MD.  I, Leamon Arnt, MD, have reviewed all documentation for this visit. The documentation on 03/02/21 for the exam, diagnosis, procedures, and orders are all accurate and complete.

## 2021-03-20 ENCOUNTER — Other Ambulatory Visit: Payer: Self-pay

## 2021-03-20 NOTE — Telephone Encounter (Signed)
Last Refill 03/02/2021 Last OV 03/02/2021 dx essential hypertension Patient is requesting refills to be put on file

## 2021-03-20 NOTE — Telephone Encounter (Signed)
  LAST APPOINTMENT DATE: 03/02/2021   NEXT APPOINTMENT DATE:@7 /14/2022  MEDICATION: ALPRAZolam Duanne Moron) 0.5 MG tablet   PHARMACY: [place], [place]: [facility] dont have the prescription on file.

## 2021-03-22 ENCOUNTER — Other Ambulatory Visit: Payer: Self-pay

## 2021-03-22 ENCOUNTER — Encounter: Payer: Self-pay | Admitting: Pulmonary Disease

## 2021-03-22 ENCOUNTER — Ambulatory Visit (INDEPENDENT_AMBULATORY_CARE_PROVIDER_SITE_OTHER): Payer: Medicare Other | Admitting: Pulmonary Disease

## 2021-03-22 VITALS — BP 118/78 | HR 74 | Temp 97.8°F | Ht 62.0 in | Wt 198.4 lb

## 2021-03-22 DIAGNOSIS — R059 Cough, unspecified: Secondary | ICD-10-CM | POA: Diagnosis not present

## 2021-03-22 DIAGNOSIS — J4530 Mild persistent asthma, uncomplicated: Secondary | ICD-10-CM

## 2021-03-22 DIAGNOSIS — K219 Gastro-esophageal reflux disease without esophagitis: Secondary | ICD-10-CM | POA: Diagnosis not present

## 2021-03-22 DIAGNOSIS — R911 Solitary pulmonary nodule: Secondary | ICD-10-CM

## 2021-03-22 DIAGNOSIS — R0982 Postnasal drip: Secondary | ICD-10-CM

## 2021-03-22 MED ORDER — ADVAIR HFA 115-21 MCG/ACT IN AERO: 2.0000 | INHALATION_SPRAY | Freq: Two times a day (BID) | RESPIRATORY_TRACT | 12 refills | Status: DC

## 2021-03-22 MED ORDER — IPRATROPIUM BROMIDE 0.03 % NA SOLN: 2.0000 | Freq: Two times a day (BID) | NASAL | 12 refills | Status: DC

## 2021-03-22 NOTE — Progress Notes (Signed)
Synopsis: Referred in June 2022 for Bronchitis by Beverley Fiedler, NP  Subjective:   PATIENT ID: Erin West GENDER: female DOB: [birth year], MRN: [id number]   HPI  Chief Complaint  Patient presents with   Consult    Patient reports she has some sinus drainage at times and has dry cough, she was told she had bronchitis and has a cd with the CXR about 2 weeks ago.    Erin West is a 70 year old woman, never smoker with history of GERD, hypertension and DVT who is referred to pulmonary clinic for evaluation of bronchitis.   She reports having 2 episodes of bronchitis per year since 2018 which include prolonged symptoms of wheezing, cough and shortness of breath.  Her most recent episode was last month where she received an albuterol inhaler, azithromycin and a course of prednisone which helped her symptoms resolved.  She reports the cough is mainly dry.  The albuterol does help somewhat with the wheezing.  She does have episodes of sinus congestion and postnasal drainage when she is acutely ill with these episodes.  She also complains of bad heartburn/reflux symptoms and does note that her reflux symptoms were increased prior to her last episodes of bronchitis.  She denies any seasonal allergy symptoms or worsening of cough or wheezing with cold air or really hot weather.  She is currently taking pantoprazole 40 mg daily for her GERD.  This was recently increased about 2 months ago from 20 mg daily.  She mainly sleeps flat at night on her sides but is unable to lay flat on her back.  She does have a height adjustable bed frame but does not sleep with the head of the bed elevated at this time.  She does report some nasal congestion and postnasal drainage.  She is currently using fluticasone nasal spray 2 sprays per nostril daily.  Past Medical History:  Diagnosis Date   Anxiety    Arthritis    Aspiration pneumonia ([place]) 2013   Atypical chest pain    Stress test 04/21/10 -  post-stress EF=89%. Normal scan.   Diarrhea    Diastolic dysfunction    But normal LV function on ECHO 2011 and low risk Myoview in 2011   DVT (deep venous thrombosis) (HCC)    Dyslipidemia    GERD (gastroesophageal reflux disease)    Hypertension    Osteoarthritis    Osteopenia    Palpitations    [employer] MCT Monitor 04/16/10-04/22/10    Syncope    Pain-mediated syncope     Family History  Problem Relation Age of Onset   Heart disease Mother    Other Mother        leg amputation   Arthritis Mother    Osteoporosis Mother    Mental illness Father    Sudden death Father    Arthritis Father    Thyroid disease Father    Stroke Paternal Grandfather      Social History   Socioeconomic History   Marital status: Widowed    Spouse name: Not on file   Number of children: 3   Years of education: Not on file   Highest education level: Not on file  Occupational History   Not on file  Tobacco Use   Smoking status: Never   Smokeless tobacco: Never  Vaping Use   Vaping Use: Never used  Substance and Sexual Activity   Alcohol use: Yes    Alcohol/week: 0.0 standard drinks  Comment: 2-3/wine per month   Drug use: No   Sexual activity: Never  Other Topics Concern   Not on file  Social History Narrative   Not on file   Social Determinants of Health   Financial Resource Strain: Not on file  Food Insecurity: Not on file  Transportation Needs: Not on file  Physical Activity: Not on file  Stress: Not on file  Social Connections: Not on file  Intimate Partner Violence: Not on file     Allergies  Allergen Reactions   Penicillins Swelling and Rash    Has patient had a PCN reaction causing immediate rash, facial/tongue/throat swelling, SOB or lightheadedness with hypotension: No Has patient had a PCN reaction causing severe rash involving mucus membranes or skin necrosis: No Has patient had a PCN reaction that required hospitalization unknown Has patient had a PCN reaction  occurring within the last 10 years: No If all of the above answers are "NO", then may proceed with Cephalosporin use.    Sulfa Antibiotics Swelling and Rash   Sulfasalazine Rash and Swelling   Tramadol Other (See Comments)    Rapid heart rate   Ciprofloxacin     Muscular pain   Trimethoprim     rash   Isovue [Iopamidol] Hives    Pt had sneezing, itchy throat, a couple of hives and swollen, itchy left eye. Pt was given 50 mg po benadryl, and water.  Dr Carlis Abbott checked pt.  We observed pt for 30 mins w/ 5 minute BP checks.  Pt left w/o complication.  Pt will need full premeds in the future.  Alfonse Alpers, RTRCT     Outpatient Medications Prior to Visit  Medication Sig Dispense Refill   acetaminophen (TYLENOL) 650 MG CR tablet Take 650 mg by mouth West 8 (eight) hours as needed for pain.     albuterol (VENTOLIN HFA) 108 (90 Base) MCG/ACT inhaler Inhale 2 puffs into the lungs West 4 (four) hours as needed for wheezing or shortness of breath. 1 each 2   ALPRAZolam (XANAX) 0.5 MG tablet Take 1 tablet (0.5 mg total) by mouth at bedtime. 90 tablet 3   atorvastatin (LIPITOR) 20 MG tablet TAKE 1 TABLET DAILY 90 tablet 3   carvedilol (COREG) 25 MG tablet Take 1 tablet (25 mg total) by mouth 2 (two) times daily. 180 tablet 3   cetirizine (ZYRTEC) 10 MG tablet Take 1 tablet (10 mg total) by mouth daily. 90 tablet 3   Cholecalciferol (VITAMIN D) 2000 UNITS CAPS Take 2,000 Units by mouth daily.      fluticasone (FLONASE) 50 MCG/ACT nasal spray Place 2 sprays into both nostrils daily. 16 g 6   losartan (COZAAR) 100 MG tablet Take 1 tablet (100 mg total) by mouth daily. 90 tablet 3   montelukast (SINGULAIR) 10 MG tablet Take 1 tablet (10 mg total) by mouth at bedtime. 90 tablet 3   Multiple Vitamin (MULTIVITAMIN) tablet Take 1 tablet by mouth daily.     pantoprazole (PROTONIX) 20 MG tablet Take 1 tablet (20 mg total) by mouth daily. (Patient taking differently: Take 40 mg by mouth daily.) 90 tablet 3    polyethylene glycol powder (GLYCOLAX/MIRALAX) powder Take 17 g by mouth daily as needed. (Patient taking differently: Take 17 g by mouth daily as needed for mild constipation.) 3350 g 1   Probiotic Product (PROBIOTIC PO) Take 1 capsule by mouth daily.      azelastine (ASTELIN) 0.1 % nasal spray Place 2 sprays into both nostrils  2 (two) times daily. Use in each nostril as directed (Patient not taking: Reported on 03/22/2021) 30 mL 12   No facility-administered medications prior to visit.    Review of Systems  Constitutional:  Negative for chills, fever, malaise/fatigue and weight loss.  HENT:  Positive for congestion. Negative for sinus pain and sore throat.   Eyes: Negative.   Respiratory:  Positive for cough, shortness of breath and wheezing. Negative for hemoptysis and sputum production.   Cardiovascular:  Negative for chest pain, palpitations, orthopnea, claudication and leg swelling.  Gastrointestinal:  Positive for heartburn. Negative for abdominal pain, nausea and vomiting.  Genitourinary: Negative.   Musculoskeletal:  Negative for joint pain and myalgias.  Skin:  Negative for rash.  Neurological:  Negative for weakness.  Endo/Heme/Allergies: Negative.   Psychiatric/Behavioral: Negative.       Objective:   Vitals:   03/22/21 1139  BP: 118/78  Pulse: 74  Temp: 97.8 F (36.6 C)  TempSrc: Oral  SpO2: 97%  Weight: 198 lb 6.4 oz (90 kg)  Height: 5\' 2"  (1.575 m)     Physical Exam Constitutional:      General: She is not in acute distress.    Appearance: She is obese. She is not ill-appearing.  HENT:     Head: Normocephalic and atraumatic.     Nose: Nose normal.     Mouth/Throat:     Mouth: Mucous membranes are moist.     Pharynx: Oropharynx is clear.  Eyes:     General: No scleral icterus.    Conjunctiva/sclera: Conjunctivae normal.     Pupils: Pupils are equal, round, and reactive to light.  Cardiovascular:     Rate and Rhythm: Normal rate and regular rhythm.      Pulses: Normal pulses.     Heart sounds: Normal heart sounds. No murmur heard. Pulmonary:     Effort: Pulmonary effort is normal.     Breath sounds: Normal breath sounds. No wheezing, rhonchi or rales.  Abdominal:     General: Bowel sounds are normal.     Palpations: Abdomen is soft.  Musculoskeletal:     Right lower leg: No edema.     Left lower leg: No edema.  Lymphadenopathy:     Cervical: No cervical adenopathy.  Skin:    General: Skin is warm and dry.  Neurological:     General: No focal deficit present.     Mental Status: She is alert.  Psychiatric:        Mood and Affect: Mood normal.        Behavior: Behavior normal.        Thought Content: Thought content normal.        Judgment: Judgment normal.      CBC    Component Value Date/Time   WBC 5.9 11/29/2020 1108   RBC 4.54 11/29/2020 1108   HGB 14.0 11/29/2020 1108   HCT 41.3 11/29/2020 1108   PLT 231.0 11/29/2020 1108   MCV 91.0 11/29/2020 1108   MCH 31.3 08/23/2019 1632   MCHC 33.8 11/29/2020 1108   RDW 14.2 11/29/2020 1108   LYMPHSABS 1.9 11/29/2020 1108   MONOABS 0.4 11/29/2020 1108   EOSABS 0.1 11/29/2020 1108   BASOSABS 0.1 11/29/2020 1108   BMP Latest Ref Rng & Units 11/29/2020 10/28/2019 08/23/2019  Glucose 70 - 99 mg/dL 83 101(H) 88  BUN 6 - 23 mg/dL 23 28(H) 20  Creatinine 0.40 - 1.20 mg/dL 0.86 0.85 0.93  Sodium 135 - 145 mEq/L  139 138 140  Potassium 3.5 - 5.1 mEq/L 4.7 4.3 4.6  Chloride 96 - 112 mEq/L 104 102 108  CO2 19 - 32 mEq/L 30 29 24   Calcium 8.4 - 10.5 mg/dL 9.2 9.5 8.8(L)   Chest imaging: CT Chest 2020 Mediastinum/Nodes: There is no hilar or mediastinal adenopathy. The esophagus is grossly unremarkable. Subcentimeter right thyroid hypodense nodule. No mediastinal fluid collection.   Lungs/Pleura: There is no focal consolidation, pleural effusion, or pneumothorax. Faint scattered hazy densities, likely atelectatic changes. Atypical infiltrate is less likely. There is a 3 mm  right lower lobe subpleural nodule (series 8, image 73). The central airways are patent.  PFT: No flowsheet data found.  Echo 06/11/2018: LV EF 60-65%. Grade 1 diastolic dysfunction. RV cavity is normal and normal systolic function.    Assessment & Plan:   Mild persistent reactive airway disease without complication - Plan: Pulmonary function test  Cough - Plan: fluticasone-salmeterol (ADVAIR HFA) 115-21 MCG/ACT inhaler  Post-nasal drainage - Plan: ipratropium (ATROVENT) 0.03 % nasal spray  Gastroesophageal reflux disease without esophagitis  Lung nodule - Plan: CT Chest Wo Contrast  Discussion: Yaslyn Cumby is a 70 year old woman, never smoker with history of GERD, hypertension and DVT who is referred to pulmonary clinic for evaluation of bronchitis.   Her episodes of bronchitis appear to be possible asthma exacerbations with prolonged periods of symptoms that includes cough, wheezing and shortness of breath.  She does have aggravating factors that include uncontrolled GERD and sinus congestion with postnasal drainage.  We will start her on LABA/ICS inhaler therapy with Advair 1 15-21 MCG 2 puffs twice daily.  She can use albuterol 1 to 2 puffs West 4-6 hours as needed for shortness of breath, cough or wheezing.  Her PPI therapy has recently been increased to 40 mg daily of pantoprazole.  I have encouraged her to sleep with the head of the bed elevated to produce any nocturnal reflux symptoms.  She is to start ipratropium nasal spray 2 sprays per nostril twice daily for sinus congestion and postnasal drainage.  She is also to continue fluticasone nasal spray 2 sprays per nostril daily.  She had a previous CT scan in 2020 with a right lower lobe subpleural nodule.  We will obtain an updated CT chest scan for the patient's cough as well as for monitoring of this nodule.  Follow-up in 3 months.  Freda Jackson, MD [facility] Office:  [phone]   Current Outpatient Medications:    acetaminophen (TYLENOL) 650 MG CR tablet, Take 650 mg by mouth West 8 (eight) hours as needed for pain., Disp: , Rfl:    albuterol (VENTOLIN HFA) 108 (90 Base) MCG/ACT inhaler, Inhale 2 puffs into the lungs West 4 (four) hours as needed for wheezing or shortness of breath., Disp: 1 each, Rfl: 2   ALPRAZolam (XANAX) 0.5 MG tablet, Take 1 tablet (0.5 mg total) by mouth at bedtime., Disp: 90 tablet, Rfl: 3   atorvastatin (LIPITOR) 20 MG tablet, TAKE 1 TABLET DAILY, Disp: 90 tablet, Rfl: 3   carvedilol (COREG) 25 MG tablet, Take 1 tablet (25 mg total) by mouth 2 (two) times daily., Disp: 180 tablet, Rfl: 3   cetirizine (ZYRTEC) 10 MG tablet, Take 1 tablet (10 mg total) by mouth daily., Disp: 90 tablet, Rfl: 3   Cholecalciferol (VITAMIN D) 2000 UNITS CAPS, Take 2,000 Units by mouth daily. , Disp: , Rfl:    fluticasone (FLONASE) 50 MCG/ACT nasal spray, Place 2 sprays  into both nostrils daily., Disp: 16 g, Rfl: 6   fluticasone-salmeterol (ADVAIR HFA) 115-21 MCG/ACT inhaler, Inhale 2 puffs into the lungs 2 (two) times daily., Disp: 1 each, Rfl: 12   ipratropium (ATROVENT) 0.03 % nasal spray, Place 2 sprays into both nostrils West 12 (twelve) hours., Disp: 30 mL, Rfl: 12   losartan (COZAAR) 100 MG tablet, Take 1 tablet (100 mg total) by mouth daily., Disp: 90 tablet, Rfl: 3   montelukast (SINGULAIR) 10 MG tablet, Take 1 tablet (10 mg total) by mouth at bedtime., Disp: 90 tablet, Rfl: 3   Multiple Vitamin (MULTIVITAMIN) tablet, Take 1 tablet by mouth daily., Disp: , Rfl:    pantoprazole (PROTONIX) 20 MG tablet, Take 1 tablet (20 mg total) by mouth daily. (Patient taking differently: Take 40 mg by mouth daily.), Disp: 90 tablet, Rfl: 3   polyethylene glycol powder (GLYCOLAX/MIRALAX) powder, Take 17 g by mouth daily as needed. (Patient taking differently: Take 17 g by mouth daily as needed for mild constipation.), Disp: 3350 g, Rfl: 1   Probiotic Product  (PROBIOTIC PO), Take 1 capsule by mouth daily. , Disp: , Rfl:

## 2021-03-22 NOTE — Patient Instructions (Addendum)
Start ipratropium nasal spray, 2 sprays per nostril twice daily  Continue the flonase, 2 sprays per nostril daily  Continue Pantoprazole 40mg  daily before breakfast  Elevate the head of your bed 4-6 inches at night to reduce night time reflux issues  Start advair inhaler 2 puffs twice daily for your reactive airways disease  We will work on getting a CT chest schedule

## 2021-03-23 ENCOUNTER — Telehealth: Payer: Self-pay | Admitting: Pulmonary Disease

## 2021-03-23 DIAGNOSIS — R0982 Postnasal drip: Secondary | ICD-10-CM

## 2021-03-23 DIAGNOSIS — R059 Cough, unspecified: Secondary | ICD-10-CM

## 2021-03-23 MED ORDER — ADVAIR HFA 115-21 MCG/ACT IN AERO: 2.0000 | INHALATION_SPRAY | Freq: Two times a day (BID) | RESPIRATORY_TRACT | 3 refills | Status: DC

## 2021-03-23 MED ORDER — IPRATROPIUM BROMIDE 0.03 % NA SOLN: 2.0000 | Freq: Two times a day (BID) | NASAL | 3 refills | Status: DC

## 2021-03-23 NOTE — Telephone Encounter (Signed)
Rx for both atrovent and Advair have been sent to [facility] for pt. Called and spoke with pt letting her know this had been done and she verbalized understanding. While speaking with pt, pt said that she was needing to know if the CT had been scheduled yet and also said was checking to see if a f/u appt had been scheduled as well.  Saw that the CT had been scheduled for pt and told her of the CT info. Pt had had recalls placed for her f/u with Dr. Erin Fulling as well as for the PFT and I saw that his August schedule was avail so I have scheduled pf a f/u with Dr. Erin Fulling and for her to have PFT prior. Nothing further needed.

## 2021-03-31 ENCOUNTER — Telehealth: Payer: Self-pay | Admitting: Cardiology

## 2021-03-31 NOTE — Telephone Encounter (Signed)
Spoke with pt, she reports increase in palpitations. She reports she has started an albuterol inhaler and the palpitations increased after starting the new inhaler. Advised her to let the folks know that prescribed it she is having problems and see if she can get something else. Pt agreed with this plan.

## 2021-03-31 NOTE — Telephone Encounter (Signed)
Patient c/o Palpitations:  High priority if patient c/o lightheadedness, shortness of breath, or chest pain  How long have you had palpitations/irregular HR/ Afib? Are you having the symptoms now? Yes patient is having them now  Are you currently experiencing lightheadedness, SOB or CP? No   Do you have a history of afib (atrial fibrillation) or irregular heart rhythm? Yes  Have you checked your BP or HR? (document readings if available): not today   Are you experiencing any other symptoms? No

## 2021-04-03 ENCOUNTER — Encounter (HOSPITAL_BASED_OUTPATIENT_CLINIC_OR_DEPARTMENT_OTHER): Payer: Self-pay | Admitting: Emergency Medicine

## 2021-04-03 ENCOUNTER — Other Ambulatory Visit: Payer: Self-pay

## 2021-04-03 ENCOUNTER — Emergency Department (HOSPITAL_BASED_OUTPATIENT_CLINIC_OR_DEPARTMENT_OTHER): Payer: Medicare Other | Admitting: Radiology

## 2021-04-03 ENCOUNTER — Emergency Department (HOSPITAL_BASED_OUTPATIENT_CLINIC_OR_DEPARTMENT_OTHER)
Admission: EM | Admit: 2021-04-03 | Discharge: 2021-04-03 | Disposition: A | Discharge status: Home / Self Care | Payer: Medicare Other | Attending: Emergency Medicine | Admitting: Emergency Medicine

## 2021-04-03 DIAGNOSIS — Z79899 Other long term (current) drug therapy: Secondary | ICD-10-CM | POA: Insufficient documentation

## 2021-04-03 DIAGNOSIS — R002 Palpitations: Secondary | ICD-10-CM | POA: Insufficient documentation

## 2021-04-03 DIAGNOSIS — R0602 Shortness of breath: Secondary | ICD-10-CM | POA: Diagnosis not present

## 2021-04-03 DIAGNOSIS — R42 Dizziness and giddiness: Secondary | ICD-10-CM | POA: Insufficient documentation

## 2021-04-03 DIAGNOSIS — R5383 Other fatigue: Secondary | ICD-10-CM | POA: Insufficient documentation

## 2021-04-03 DIAGNOSIS — I10 Essential (primary) hypertension: Secondary | ICD-10-CM | POA: Diagnosis not present

## 2021-04-03 DIAGNOSIS — E860 Dehydration: Secondary | ICD-10-CM

## 2021-04-03 DIAGNOSIS — Z96653 Presence of artificial knee joint, bilateral: Secondary | ICD-10-CM | POA: Insufficient documentation

## 2021-04-03 LAB — CBC
HCT: 41 % (ref 36.0–46.0)
Hemoglobin: 14 g/dL (ref 12.0–15.0)
MCH: 31.3 pg (ref 26.0–34.0)
MCHC: 34.1 g/dL (ref 30.0–36.0)
MCV: 91.5 fL (ref 80.0–100.0)
Platelets: 239 10*3/uL (ref 150–400)
RBC: 4.48 MIL/uL (ref 3.87–5.11)
RDW: 12.8 % (ref 11.5–15.5)
WBC: 4.2 10*3/uL (ref 4.0–10.5)
nRBC: 0 % (ref 0.0–0.2)

## 2021-04-03 LAB — BASIC METABOLIC PANEL
Anion gap: 9 (ref 5–15)
BUN: 24 mg/dL — ABNORMAL HIGH (ref 8–23)
CO2: 26 mmol/L (ref 22–32)
Calcium: 9.3 mg/dL (ref 8.9–10.3)
Chloride: 104 mmol/L (ref 98–111)
Creatinine, Ser: 1.1 mg/dL — ABNORMAL HIGH (ref 0.44–1.00)
GFR, Estimated: 54 mL/min — ABNORMAL LOW (ref >60)
Glucose, Bld: 109 mg/dL — ABNORMAL HIGH (ref 70–99)
Potassium: 4.2 mmol/L (ref 3.5–5.1)
Sodium: 139 mmol/L (ref 135–145)

## 2021-04-03 LAB — TROPONIN I (HIGH SENSITIVITY): Troponin I (High Sensitivity): 2 ng/L (ref <18)

## 2021-04-03 MED ORDER — SODIUM CHLORIDE 0.9 % IV BOLUS
1000.0000 mL | Freq: Once | INTRAVENOUS | Status: AC
Start: 2021-04-03 — End: 2021-04-03
  Administered 2021-04-03: 1000 mL via INTRAVENOUS

## 2021-04-03 NOTE — ED Provider Notes (Signed)
[facility] EMER[facility] DEPT Provider Note   CSN: [id number] Arrival date & time: 04/03/21  1255     History Chief Complaint  Patient presents with   Palpitations    Erin West is a 70 y.o. female.  HPI 70 year old female presents with a chief complaint of palpitations.  She has had these on and off for quite some time but they have been more consistent and more prominent over the last 4 days.  She at times is dizzy and has had a hard time taking a deep breath though she is not actually short of breath.  There is no chest pain.  Feels like her heart is skipping beats.  Otherwise, she is noted some lower blood pressures than typical and feels fatigued.  No vomiting, diarrhea, fevers, headache.  Past Medical History:  Diagnosis Date   Anxiety    Arthritis    Aspiration pneumonia ([place]) 2013   Atypical chest pain    Stress test 04/21/10 - post-stress EF=89%. Normal scan.   Diarrhea    Diastolic dysfunction    But normal LV function on ECHO 2011 and low risk Myoview in 2011   DVT (deep venous thrombosis) ([place])    Dyslipidemia    GERD (gastroesophageal reflux disease)    Hypertension    Osteoarthritis    Osteopenia    Palpitations    Biowatch MCT Monitor 04/16/10-04/22/10    Syncope    Pain-mediated syncope    Patient Active Problem List   Diagnosis Date Noted   Depression, major, single episode, moderate ([place]) 11/02/2020   OAB (overactive bladder) 10/28/2019   Atrophic vaginitis 10/22/2018   Mixed hyperlipidemia 12/04/2017   Chronic seasonal allergic rhinitis 12/04/2017   Chronic pansinusitis 09/09/2017   Laryngopharyngeal reflux (LPR) 09/09/2017   Chronic cough 07/24/2017   Status post total right knee replacement 01/24/2016   Bilateral leg edema 03/30/2014   History of gastritis 02/10/2014   Dysphagia 01/28/2014   Irritable bowel syndrome (IBS) 09/08/2013   Osteopenia 07/14/2013   Osteoarthritis, multiple sites 07/14/2013   Essential hypertension     Gastroesophageal reflux disease without esophagitis     Past Surgical History:  Procedure Laterality Date   CHOLECYSTECTOMY  2006   KNEE ARTHROSCOPY Right    REPLACEMENT TOTAL KNEE Left    left   REPLACEMENT TOTAL KNEE Right    TUBAL LIGATION  06/1978     OB History     Gravida  3   Para  3   Term      Preterm      AB      Living  3      SAB      IAB      Ectopic      Multiple      Live Births              Family History  Problem Relation Age of Onset   Heart disease Mother    Other Mother        leg amputation   Arthritis Mother    Osteoporosis Mother    Mental illness Father    Sudden death Father    Arthritis Father    Thyroid disease Father    Stroke Paternal Grandfather     Social History   Tobacco Use   Smoking status: Never   Smokeless tobacco: Never  Vaping Use   Vaping Use: Never used  Substance Use Topics   Alcohol use: Yes  Alcohol/week: 0.0 standard drinks    Comment: 2-3/wine per month   Drug use: No    Home Medications Prior to Admission medications   Medication Sig Start Date End Date Taking? Authorizing Provider  acetaminophen (TYLENOL) 650 MG CR tablet Take 650 mg by mouth every 8 (eight) hours as needed for pain.    [provider]  albuterol (VENTOLIN HFA) 108 (90 Base) MCG/ACT inhaler Inhale 2 puffs into the lungs every 4 (four) hours as needed for wheezing or shortness of breath. 02/06/21   Leamon Arnt, MD  ALPRAZolam Duanne Moron) 0.5 MG tablet Take 1 tablet (0.5 mg total) by mouth at bedtime. 03/02/21   Leamon Arnt, MD  atorvastatin (LIPITOR) 20 MG tablet TAKE 1 TABLET DAILY 10/24/20   Leamon Arnt, MD  carvedilol (COREG) 25 MG tablet Take 1 tablet (25 mg total) by mouth 2 (two) times daily. 02/15/20   [name], MD  cetirizine (ZYRTEC) 10 MG tablet Take 1 tablet (10 mg total) by mouth daily. 11/02/20   Leamon Arnt, MD  Cholecalciferol (VITAMIN D) 2000 UNITS CAPS Take 2,000 Units by mouth  daily.     [provider]  fluticasone (FLONASE) 50 MCG/ACT nasal spray Place 2 sprays into both nostrils daily. 05/27/20   Leamon Arnt, MD  fluticasone-salmeterol (ADVAIR HFA) [phone] MCG/ACT inhaler Inhale 2 puffs into the lungs 2 (two) times daily. 03/23/21   Freddi Starr, MD  ipratropium (ATROVENT) 0.03 % nasal spray Place 2 sprays into both nostrils every 12 (twelve) hours. 03/23/21   Freddi Starr, MD  losartan (COZAAR) 100 MG tablet Take 1 tablet (100 mg total) by mouth daily. 11/29/20   Leamon Arnt, MD  montelukast (SINGULAIR) 10 MG tablet Take 1 tablet (10 mg total) by mouth at bedtime. 11/02/20   Leamon Arnt, MD  Multiple Vitamin (MULTIVITAMIN) tablet Take 1 tablet by mouth daily.    [provider]  pantoprazole (PROTONIX) 20 MG tablet Take 1 tablet (20 mg total) by mouth daily. Patient taking differently: Take 40 mg by mouth daily. 10/21/20   Leamon Arnt, MD  polyethylene glycol powder (GLYCOLAX/MIRALAX) powder Take 17 g by mouth daily as needed. Patient taking differently: Take 17 g by mouth daily as needed for mild constipation. 12/13/17   Leamon Arnt, MD  Probiotic Product (PROBIOTIC PO) Take 1 capsule by mouth daily.     [provider]    Allergies    Penicillins, Sulfa antibiotics, Sulfasalazine, Tramadol, Ciprofloxacin, Trimethoprim, and Isovue [iopamidol]  Review of Systems   Review of Systems  Constitutional:  Positive for fatigue. Negative for fever.  Respiratory:  Negative for shortness of breath.   Cardiovascular:  Positive for palpitations. Negative for chest pain.  Gastrointestinal:  Negative for diarrhea and vomiting.  Neurological:  Positive for light-headedness.  All other systems reviewed and are negative.  Physical Exam Updated Vital Signs BP 117/77 (BP Location: Right Arm)   Pulse 81   Temp 98.4 F (36.9 C)   Resp 20   Ht 5\' 2"  (1.575 m)   Wt 90 kg   LMP 09/24/2012 (Approximate)   SpO2 97%   BMI 36.29  kg/m   Physical Exam Vitals and nursing note reviewed.  Constitutional:      General: She is not in acute distress.    Appearance: She is well-developed. She is not ill-appearing or diaphoretic.  HENT:     Head: Normocephalic and atraumatic.     Right  Ear: External ear normal.     Left Ear: External ear normal.     Nose: Nose normal.  Eyes:     General:        Right eye: No discharge.        Left eye: No discharge.     Extraocular Movements: Extraocular movements intact.     Pupils: Pupils are equal, round, and reactive to light.  Cardiovascular:     Rate and Rhythm: Normal rate and regular rhythm.     Heart sounds: Normal heart sounds.  Pulmonary:     Effort: Pulmonary effort is normal.     Breath sounds: Normal breath sounds.  Abdominal:     Palpations: Abdomen is soft.     Tenderness: There is no abdominal tenderness.  Skin:    General: Skin is warm and dry.  Neurological:     Mental Status: She is alert.     Comments: CN 3-12 grossly intact. 5/5 strength in all 4 extremities. Grossly normal sensation. Normal finger to nose.   Psychiatric:        Mood and Affect: Mood is not anxious.    ED Results / Procedures / Treatments   Labs (all labs ordered are listed, but only abnormal results are displayed) Labs Reviewed  BASIC METABOLIC PANEL - Abnormal; Notable for the following components:      Result Value   Glucose, Bld 109 (*)    BUN 24 (*)    Creatinine, Ser 1.10 (*)    GFR, Estimated 54 (*)    All other components within normal limits  CBC  TROPONIN I (HIGH SENSITIVITY)    EKG EKG Interpretation  Date/Time:  Monday April 03 2021 13:06:26 EDT Ventricular Rate:  90 PR Interval:  186 QRS Duration: 62 QT Interval:  348 QTC Calculation: 425 R Axis:   66 Text Interpretation: Sinus rhythm with occasional Premature ventricular complexes Low voltage QRS Interpretation limited secondary to artifact Confirmed by Sherwood Gambler [phone]) on 04/03/2021 1:36:51  PM  Radiology DG Chest 2 View  Result Date: 04/03/2021 CLINICAL DATA:  Palpitations since Thursday.  Shortness of breath EXAM: CHEST - 2 VIEW COMPARISON:  11/19/2015 FINDINGS: Heart size and mediastinal contours are normal. No pleural effusion or edema identified. No airspace densities. The visualized osseous structures are unremarkable. IMPRESSION: No active cardiopulmonary disease. Electronically Signed   By: Kerby Moors M.D.   On: 04/03/2021 13:53    Procedures Procedures   Medications Ordered in ED Medications  sodium chloride 0.9 % bolus 1,000 mL (has no administration in time range)    ED Course  I have reviewed the triage vital signs and the nursing notes.  Pertinent labs & imaging results that were available during my care of the patient were reviewed by me and considered in my medical decision making (see chart for details).    MDM Rules/Calculators/A&P                          Patient is well-appearing.  She is not having any significant arrhythmia on ECG or cardiac monitoring when I reviewed the telemetry.  She does have some soft blood pressure and will give fluids and she has a mild bump in her creatinine but not consistent with renal failure.  Troponin is negative after many days of symptoms.  She endorses a hard time getting a full breath but she also states she is not really short of breath.  My suspicion for  PE is pretty low and while she does have a remote DVT history, she has normal oxygen, normal work of breathing, normal heart rate, etc. and so I think PE is very unlikely.  We will give IV fluids and will discharge home to follow-up with PCP and cardiology.  Final Clinical Impression(s) / ED Diagnoses Final diagnoses:  None    Rx / DC Orders ED Discharge Orders     None        Sherwood Gambler, MD 04/03/21 1539

## 2021-04-03 NOTE — ED Notes (Signed)
Patient verbalizes understanding of discharge instructions. Opportunity for questioning and answers were provided. Patient discharged from ED.  °

## 2021-04-03 NOTE — Telephone Encounter (Signed)
Spoke to patient she stated she has been having very frequent irregular heart beat for the past 4 to 5 days.Stated she is having sob at present.No chest pain.Advised no appointments available today.Advised to go to ED.Stated she will go to new ED at [facility].Appointment scheduled with Laurann Montana PA 7/22 at 10:00 am.I will make Dr.Crenshaw aware.

## 2021-04-03 NOTE — ED Triage Notes (Addendum)
Pt arrives to ED with c/o of palpations x4 days. Pt reports multiple instances daily where she felt like her heart was racing. Reports that she feels like she cannot take a deep breath. No DOE. Pt denies CP, fevers, chills.

## 2021-04-03 NOTE — Telephone Encounter (Signed)
Patient c/o Palpitations:  High priority if patient c/o lightheadedness, shortness of breath, or chest pain  How long have you had palpitations/irregular HR/ Afib? Are you having the symptoms now? Off and on past 4-5 days, not having now   Are you currently experiencing lightheadedness, SOB or CP? No   Do you have a history of afib (atrial fibrillation) or irregular heart rhythm? Yes  Have you checked your BP or HR? (document readings if available): BP has been fine HR between 70's and mid 80's  Are you experiencing any other symptoms? SOB over weekend with palpitations and fatigue    Reports still having palpitations since she last spoke with office with SOB occurring as well.

## 2021-04-06 ENCOUNTER — Other Ambulatory Visit: Payer: Self-pay

## 2021-04-06 ENCOUNTER — Ambulatory Visit: Payer: Medicare Other

## 2021-04-06 ENCOUNTER — Ambulatory Visit
Admission: RE | Admit: 2021-04-06 | Discharge: 2021-04-06 | Disposition: A | Discharge status: Home / Self Care | Payer: Medicare Other | Source: Ambulatory Visit | Attending: Pulmonary Disease | Admitting: Pulmonary Disease

## 2021-04-06 DIAGNOSIS — R911 Solitary pulmonary nodule: Secondary | ICD-10-CM

## 2021-04-10 ENCOUNTER — Telehealth: Payer: Self-pay | Admitting: Pulmonary Disease

## 2021-04-10 DIAGNOSIS — R0982 Postnasal drip: Secondary | ICD-10-CM

## 2021-04-10 NOTE — Telephone Encounter (Signed)
Called and spoke with patient. Advised her that she will get a returned call once Dr. Erin Fulling has interpret her CT results. Patient verbalized understanding.

## 2021-04-11 ENCOUNTER — Other Ambulatory Visit: Payer: Medicare Other

## 2021-04-13 NOTE — Progress Notes (Signed)
Office Visit    Patient Name: Erin West Date of Encounter: 04/13/2021  PCP:  Leamon Arnt, [place]  Cardiologist:  [name], MD  Advanced Practice Provider:  No care team member to display Electrophysiologist:  None    Chief Complaint    Erin West is a 70 y.o. female with a hx of palpitations, HTN, HLD presents today for palpitations   Past Medical History    Past Medical History:  Diagnosis Date   Anxiety    Arthritis    Aspiration pneumonia ([place]) 2013   Atypical chest pain    Stress test 04/21/10 - post-stress EF=89%. Normal scan.   Diarrhea    Diastolic dysfunction    But normal LV function on ECHO 2011 and low risk Myoview in 2011   DVT (deep venous thrombosis) ([facility])    Dyslipidemia    GERD (gastroesophageal reflux disease)    Hypertension    Osteoarthritis    Osteopenia    Palpitations    Biowatch MCT Monitor 04/16/10-04/22/10    Syncope    Pain-mediated syncope   Past Surgical History:  Procedure Laterality Date   CHOLECYSTECTOMY  2006   KNEE ARTHROSCOPY Right    REPLACEMENT TOTAL KNEE Left    left   REPLACEMENT TOTAL KNEE Right    TUBAL LIGATION  06/1978    Allergies  Allergies  Allergen Reactions   Penicillins Swelling and Rash    Has patient had a PCN reaction causing immediate rash, facial/tongue/throat swelling, SOB or lightheadedness with hypotension: No Has patient had a PCN reaction causing severe rash involving mucus membranes or skin necrosis: No Has patient had a PCN reaction that required hospitalization unknown Has patient had a PCN reaction occurring within the last 10 years: No If all of the above answers are "NO", then may proceed with Cephalosporin use.    Sulfa Antibiotics Swelling and Rash   Sulfasalazine Rash and Swelling   Tramadol Other (See Comments)    Rapid heart rate   Ciprofloxacin     Muscular pain   Trimethoprim     rash   Isovue [Iopamidol] Hives    Pt had  sneezing, itchy throat, a couple of hives and swollen, itchy left eye. Pt was given 50 mg po benadryl, and water.  Dr Carlis Abbott checked pt.  We observed pt for 30 mins w/ 5 minute BP checks.  Pt left w/o complication.  Pt will need full premeds in the future.  Alfonse Alpers, RTRCT    History of Present Illness    Erin West is a 70 y.o. female with a hx of palpitations, coronary artery calcification by CT,  HTN, HLD last seen 07/19/20 by Dr. Stanford Breed.  She follows closely with Dr. Stanford Breed due to palpitations. Nuclear study July 2011 with normal LVEF and no ischemic. Event monitor July 2011 with NSR. Echo 05/2018 normal LVEF, mild diastolic dysfunction. Monitor 06/2018 with NSR with PVC's.   She was evaluated in the ED 04/03/21 due to palpitations. Noted sensation of heart skipping beats. Her EKG showed NSR. She was given IVF due to soft blood pressure. HS troponin negative, K 4.2, Hb 14. CT chest with aortic atherosclerosis, 3 vessel coronary vascular calcification, several faint ground glass nodules recommended for non-contrast CT at 3-6 months.   She presents today for follow-up. Tells me she had a whole weekend of palpitations. As her blood pressure was low triage recommended she present to the emergency  department. She does not drink soda. Tells me she drinks iced tea sometimes. Drinks mostly water with lemon - drinks about three of her 24oz glasses of water. Tells me her palpitations have been occurring "all the time".  They last seconds and generally self resolved.  They are bothersome and they are only.  She tells me stays "stressed all the time". Her husband passed away 11 months ago in 2020/06/01, offered my condolences.  She was interested in grief counseling but it was all online and she preferred to see someone in person.  Her primary care does have her on a Xanax as needed in the evenings but she wonders whether something longer-term would be more helpful.  Encouraged to discuss her primary care  provider.  She follows with Dr. Erin Fulling of pulmonology. We reviewed her CT report showing new ground glass nodules. She reports no shortness of breath at rest and that her dyspnea on exertion is stable at her baseline. She notes more shortness of breath when she starts to worry about things.   EKGs/Labs/Other Studies Reviewed:   The following studies were reviewed today:  CT chest 04/06/21  IMPRESSION: 1. No consolidative changes. 2. Several faint ground-glass nodules in the lingula may represent areas of atelectasis or less likely developing atypical infection. Non-contrast chest CT at 3-6 months is recommended. If the nodules are stable at time of repeat CT, then future CT at 18-24 months (from today's scan) is considered optional for low-risk patients, but is recommended for high-risk patients. This recommendation follows the consensus statement: Guidelines for Management of Incidental Pulmonary Nodules Detected on CT Images: From the Fleischner Society 2017; Radiology 2017; 284:228-243. 3. Aortic Atherosclerosis (ICD10-I70.0).    EKG:   No EKG is ordered today.  The ekg independently reviewed from 04/03/2021 demonstrated normal sinus rhythm 90 bpm with occasional PVC.   Recent Labs: 11/29/2020: ALT 22; TSH 2.56 04/03/2021: BUN 24; Creatinine, Ser 1.10; Hemoglobin 14.0; Platelets 239; Potassium 4.2; Sodium 139  Recent Lipid Panel    Component Value Date/Time   CHOL 180 11/29/2020 1108   TRIG 56.0 11/29/2020 1108   HDL 71.00 11/29/2020 1108   CHOLHDL 3 11/29/2020 1108   VLDL 11.2 11/29/2020 1108   LDLCALC 98 11/29/2020 1108   Home Medications   No outpatient medications have been marked as taking for the 04/14/21 encounter (Appointment) with Loel Dubonnet, NP.     Review of Systems      All other systems reviewed and are otherwise negative except as noted above.  Physical Exam    VS:  LMP 09/24/2012 (Approximate)  , BMI There is no height or weight on file to  calculate BMI.  Wt Readings from Last 3 Encounters:  04/03/21 198 lb 6.4 oz (90 kg)  03/22/21 198 lb 6.4 oz (90 kg)  03/02/21 198 lb 6.4 oz (90 kg)     GEN: Well nourished, well developed, in no acute distress. HEENT: normal. Neck: Supple, no JVD, carotid bruits, or masses. Cardiac: RRR, no murmurs, rubs, or gallops. No clubbing, cyanosis, edema.  Radials/PT 2+ and equal bilaterally.  Respiratory:  Respirations regular and unlabored, clear to auscultation bilaterally. GI: Soft, nontender, nondistended. MS: No deformity or atrophy. Skin: Warm and dry, no rash. Neuro:  Strength and sensation are intact. Psych: Normal affect.  Assessment & Plan    Palpitations - Previous monitor 2019 with infrequent PVC. Recent ED visit with normal CBC, BMP. 11/30/20 TSH 2.56.  Anticipate stress and dehydration are most contributory  to her symptoms.  She was provided IV fluids in the ED-we discussed the importance of staying adequately hydrated.  She has lots of stress as her husband passed 11 months ago-we will reach out to psychologist in our building to see if they have additional resources.  As palpitations are persistent and bothersome we will start diltiazem 120 mg daily.  Continue carvedilol 25 mg twice daily.  To prevent hypotension, reduce losartan to 50 mg daily.  HTN - BP well controlled. Continue current antihypertensive regimen.    Coronary artery calcification by CT - Stable with no anginal symptoms. No indication for ischemic evaluation.  GDMT includes atorvastatin, carvedilol. Heart healthy diet and regular cardiovascular exercise encouraged.    Ground glass nodules by CT - Recommended for non contrast CT chest 3-6 months. Recommend follow up with pulmonology in August as scheduled.  HLD - 11/29/20 total cholesterol 180, HDL 71, LDL 98, triglycerides 56. Continue Atorvastatin 20mg  QD.  Disposition: Follow up in 6 week(s) with Dr. Stanford Breed or APP.  Signed, Loel Dubonnet, NP 04/13/2021,  7:46 PM [place]

## 2021-04-14 ENCOUNTER — Encounter (HOSPITAL_BASED_OUTPATIENT_CLINIC_OR_DEPARTMENT_OTHER): Payer: Self-pay | Admitting: Family

## 2021-04-14 ENCOUNTER — Other Ambulatory Visit: Payer: Self-pay

## 2021-04-14 ENCOUNTER — Ambulatory Visit (INDEPENDENT_AMBULATORY_CARE_PROVIDER_SITE_OTHER): Payer: Medicare Other | Admitting: Family

## 2021-04-14 VITALS — BP 120/96 | HR 78 | Ht 62.0 in | Wt 200.0 lb

## 2021-04-14 DIAGNOSIS — R002 Palpitations: Secondary | ICD-10-CM

## 2021-04-14 DIAGNOSIS — E782 Mixed hyperlipidemia: Secondary | ICD-10-CM

## 2021-04-14 DIAGNOSIS — I2510 Atherosclerotic heart disease of native coronary artery without angina pectoris: Secondary | ICD-10-CM | POA: Diagnosis not present

## 2021-04-14 DIAGNOSIS — I10 Essential (primary) hypertension: Secondary | ICD-10-CM | POA: Diagnosis not present

## 2021-04-14 MED ORDER — DILTIAZEM HCL ER COATED BEADS 120 MG PO CP24: 120.0000 mg | ORAL_CAPSULE | Freq: Every day | ORAL | 3 refills | Status: DC

## 2021-04-14 MED ORDER — LOSARTAN POTASSIUM 100 MG PO TABS: 50.0000 mg | ORAL_TABLET | Freq: Every day | ORAL | 3 refills | Status: DC

## 2021-04-14 NOTE — Patient Instructions (Addendum)
Medication Instructions:  Your physician has recommended you make the following change in your medication:   CHANGE Losartan to half tablet ('50mg'$ ) once per day  START Diltiazem '120mg'$  one tablet daily  *If you need a refill on your cardiac medications before your next appointment, please call your pharmacy*   Lab Work: None ordered today.   Testing/Procedures: None ordered today.    Follow-Up: At [facility], you and your health needs are our priority.  As part of our continuing mission to provide you with exceptional heart care, we have created designated Provider Care Teams.  These Care Teams include your primary Cardiologist (physician) and Advanced Practice Providers (APPs -  Physician Assistants and Nurse Practitioners) who all work together to provide you with the care you need, when you need it.  We recommend signing up for the patient portal called "MyChart".  Sign up information is provided on this After Visit Summary.  MyChart is used to connect with patients for Virtual Visits (Telemedicine).  Patients are able to view lab/test results, encounter notes, upcoming appointments, etc.  Non-urgent messages can be sent to your provider as well.   To learn more about what you can do with MyChart, go to [email or web address].    Your next appointment:   6 week(s)  05/31/2021 10:30  ARRIVE AT 10:15  The format for your next appointment:   In Person  Provider:   Laurann Montana, NP   Other Instructions  Heart Healthy Diet Recommendations: A low-salt diet is recommended. Meats should be grilled, baked, or boiled. Avoid fried foods. Focus on lean protein sources like fish or chicken with vegetables and fruits. The American Heart Association is a Microbiologist!  American Heart Association Diet and Lifeystyle Recommendations   Exercise recommendations: The American Heart Association recommends 150 minutes of moderate intensity exercise weekly. Try 30 minutes of moderate  intensity exercise 4-5 times per week. This could include walking, jogging, or swimming.  To prevent palpitations: Make sure you are adequately hydrated.  Avoid and/or limit caffeine containing beverages like soda or tea. Exercise regularly.  Manage stress well. Some over the counter medications can cause palpitations such as Benadryl, AdvilPM, TylenolPM. Regular Advil or Tylenol do not cause palpitations.    Recommend talking to Dr. Jonni Sanger about additional medication for anxiety such as Hydroxyzine or Klonopin.

## 2021-04-17 NOTE — Telephone Encounter (Signed)
Patient checking on CT results. Patient phone number is [phone].

## 2021-04-17 NOTE — Telephone Encounter (Signed)
I called and spoke with the patient today regarding her CT chest scan results from 04/06/2021.  We discussed that there are nonspecific groundglass findings within the lingula.  We will follow these up in 3 to 6 months with a CT chest scan but overall I am not concerned about these findings at this time in regards to the etiology of her cough.  We discussed that the esophagus is somewhat patulous on this CT chest scan again raising the concern for ongoing reflux as the etiology of her chronic cough.  Her cough is somewhat improved with PPI therapy increase and the addition of ICS/LABA inhaler therapy.  She is following up in 1 month in August with pulmonary function testing.  She continues to have ongoing sinus congestion and drainage despite the addition of ipratropium nasal spray to her daily fluticasone nasal spray.  She is amenable to an ENT referral which has been placed.  Freda Jackson, MD [facility] Office: [phone]

## 2021-04-21 ENCOUNTER — Encounter (HOSPITAL_BASED_OUTPATIENT_CLINIC_OR_DEPARTMENT_OTHER): Payer: Self-pay

## 2021-04-24 ENCOUNTER — Other Ambulatory Visit: Payer: Self-pay | Admitting: Family Medicine

## 2021-04-24 ENCOUNTER — Ambulatory Visit: Payer: Medicare Other

## 2021-04-28 ENCOUNTER — Other Ambulatory Visit: Payer: Self-pay

## 2021-04-28 MED ORDER — CARVEDILOL 25 MG PO TABS: 25.0000 mg | ORAL_TABLET | Freq: Two times a day (BID) | ORAL | 1 refills | Status: DC

## 2021-05-17 ENCOUNTER — Ambulatory Visit (INDEPENDENT_AMBULATORY_CARE_PROVIDER_SITE_OTHER): Payer: Medicare Other | Admitting: Pulmonary Disease

## 2021-05-17 ENCOUNTER — Other Ambulatory Visit: Payer: Self-pay

## 2021-05-17 ENCOUNTER — Encounter: Payer: Self-pay | Admitting: Pulmonary Disease

## 2021-05-17 VITALS — BP 136/82 | HR 82 | Temp 97.4°F | Ht 62.0 in | Wt 200.0 lb

## 2021-05-17 DIAGNOSIS — J4530 Mild persistent asthma, uncomplicated: Secondary | ICD-10-CM

## 2021-05-17 DIAGNOSIS — K219 Gastro-esophageal reflux disease without esophagitis: Secondary | ICD-10-CM | POA: Diagnosis not present

## 2021-05-17 DIAGNOSIS — R0982 Postnasal drip: Secondary | ICD-10-CM | POA: Diagnosis not present

## 2021-05-17 DIAGNOSIS — R911 Solitary pulmonary nodule: Secondary | ICD-10-CM

## 2021-05-17 DIAGNOSIS — R059 Cough, unspecified: Secondary | ICD-10-CM | POA: Diagnosis not present

## 2021-05-17 NOTE — Patient Instructions (Signed)
Full PFT performed today. °

## 2021-05-17 NOTE — Progress Notes (Signed)
Synopsis: Referred in June 2022 for Bronchitis by Beverley Fiedler, NP  Subjective:   PATIENT ID: Erin West: female DOB: 01/23/1951, MRN: [id number]  HPI  Chief Complaint  Patient presents with   Follow-up    Pft follow up    Erin West is a 70 year old woman, never smoker with history of GERD, hypertension and DVT who returns to pulmonary clinic for evaluation of cough.   She reports improvement in her cough with starting inhaler therapy with advair 115-32mg 2 puffs twice daily since last visit along with fluticasone and ipratropium nasal sprays for post nasal drainage. She rates her cough at a level 4 currently where is was previously an 8-9 (10 being the worst). She continues to have issues with GERD despite using pantoprazole '40mg'$  daily and elevated the head of her bed at night.   We discussed her CT Chest scan over the phone. She has multiple subcentimeter nodules that will require follow up in the future. 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   She has referral to ENT clinic for evaluation of the cough and is awaiting to schedule this visit.  OV 02/2021 She reports having 2 episodes of bronchitis per year since 2018 which include prolonged symptoms of wheezing, cough and shortness of breath.  Her most recent episode was last month where she received an albuterol inhaler, azithromycin and a course of prednisone which helped her symptoms resolved.  She reports the cough is mainly dry.  The albuterol does help somewhat with the wheezing.  She does have episodes of sinus congestion and postnasal drainage when she is acutely ill with these episodes.  She also complains of bad heartburn/reflux symptoms and does note that her reflux symptoms were increased prior to her last episodes of bronchitis.  She denies any seasonal allergy symptoms or worsening of cough or wheezing with cold air or really hot weather.  She is currently  taking pantoprazole 40 mg daily for her GERD.  This was recently increased about 2 months ago from 20 mg daily.  She mainly sleeps flat at night on her sides but is unable to lay flat on her back.  She does have a height adjustable bed frame but does not sleep with the head of the bed elevated at this time.  She does report some nasal congestion and postnasal drainage.  She is currently using fluticasone nasal spray 2 sprays per nostril daily.  Past Medical History:  Diagnosis Date   Anxiety    Arthritis    [place]   Atypical chest pain    Stress test 04/21/10 - post-stress EF=89%. Normal scan.   Diarrhea    Diastolic dysfunction    But normal LV function on ECHO 2011 and low risk Myoview in 2011   DVT (deep venous thrombosis) (HCC)    Dyslipidemia    GERD (gastroesophageal reflux disease)    Hypertension    Osteoarthritis    Osteopenia    Palpitations    [employer] MCT Monitor 04/16/10-04/22/10    Syncope    Pain-mediated syncope     Family History  Problem Relation Age of Onset   Heart disease Mother    Other Mother        leg amputation   Arthritis Mother    Osteoporosis Mother    Mental illness Father    Sudden death Father    Arthritis Father    Thyroid disease  Father    Stroke Paternal Grandfather      Social History   Socioeconomic History   Marital status: Widowed    Spouse name: Not on file   Number of children: 3   Years of education: Not on file   Highest education level: Not on file  Occupational History   Not on file  Tobacco Use   Smoking status: Never   Smokeless tobacco: Never  Vaping Use   Vaping Use: Never used  Substance and Sexual Activity   Alcohol use: Yes    Alcohol/week: 0.0 standard drinks    Comment: 2-3/wine per month   Drug use: No   Sexual activity: Never  Other Topics Concern   Not on file  Social History Narrative   Not on file   Social Determinants of Health   Financial Resource Strain: Not on file   Food Insecurity: Not on file  Transportation Needs: Not on file  Physical Activity: Not on file  Stress: Not on file  Social Connections: Not on file  Intimate Partner Violence: Not on file     Allergies  Allergen Reactions   Penicillins Swelling and Rash    Has patient had a PCN reaction causing immediate rash, facial/tongue/throat swelling, SOB or lightheadedness with hypotension: No Has patient had a PCN reaction causing severe rash involving mucus membranes or skin necrosis: No Has patient had a PCN reaction that required hospitalization unknown Has patient had a PCN reaction occurring within the last 10 years: No If all of the above answers are "NO", then may proceed with Cephalosporin use.    Sulfa Antibiotics Swelling and Rash   Sulfasalazine Rash and Swelling   Tramadol Other (See Comments)    Rapid heart rate   Ciprofloxacin     Muscular pain   Trimethoprim     rash   Isovue [Iopamidol] Hives    Pt had sneezing, itchy throat, a couple of hives and swollen, itchy left eye. Pt was given 50 mg po benadryl, and water.  Dr Carlis Abbott checked pt.  We observed pt for 30 mins w/ 5 minute BP checks.  Pt left w/o complication.  Pt will need full premeds in the future.  Alfonse Alpers, RTRCT     Outpatient Medications Prior to Visit  Medication Sig Dispense Refill   acetaminophen (TYLENOL) 650 MG CR tablet Take 650 mg by mouth every 8 (eight) hours as needed for pain.     albuterol (VENTOLIN HFA) 108 (90 Base) MCG/ACT inhaler Inhale 2 puffs into the lungs every 4 (four) hours as needed for wheezing or shortness of breath. 1 each 2   ALPRAZolam (XANAX) 0.5 MG tablet Take 1 tablet (0.5 mg total) by mouth at bedtime. 90 tablet 3   atorvastatin (LIPITOR) 20 MG tablet TAKE 1 TABLET DAILY 90 tablet 3   carvedilol (COREG) 25 MG tablet Take 1 tablet (25 mg total) by mouth 2 (two) times daily. 180 tablet 1   cetirizine (ZYRTEC) 10 MG tablet Take 1 tablet (10 mg total) by mouth daily. 90 tablet 3    Cholecalciferol (VITAMIN D) 2000 UNITS CAPS Take 2,000 Units by mouth daily.      diltiazem (CARDIZEM CD) 120 MG 24 hr capsule Take 1 capsule (120 mg total) by mouth daily. 90 capsule 3   fluticasone (FLONASE) 50 MCG/ACT nasal spray Place 2 sprays into both nostrils daily. 16 g 6   fluticasone-salmeterol (ADVAIR HFA) 115-21 MCG/ACT inhaler Inhale 2 puffs into the lungs 2 (two) times  daily. 36 g 3   ipratropium (ATROVENT) 0.03 % nasal spray Place 2 sprays into both nostrils every 12 (twelve) hours. 90 mL 3   losartan (COZAAR) 100 MG tablet Take 0.5 tablets (50 mg total) by mouth daily. 45 tablet 3   montelukast (SINGULAIR) 10 MG tablet Take 1 tablet (10 mg total) by mouth at bedtime. 90 tablet 3   Multiple Vitamin (MULTIVITAMIN) tablet Take 1 tablet by mouth daily.     pantoprazole (PROTONIX) 20 MG tablet Take 1 tablet (20 mg total) by mouth daily. (Patient taking differently: Take 40 mg by mouth daily.) 90 tablet 3   polyethylene glycol powder (GLYCOLAX/MIRALAX) powder Take 17 g by mouth daily as needed. (Patient taking differently: Take 17 g by mouth daily as needed for mild constipation.) 3350 g 1   Probiotic Product (PROBIOTIC PO) Take 1 capsule by mouth daily.      No facility-administered medications prior to visit.    Review of Systems  Constitutional:  Negative for chills, fever, malaise/fatigue and weight loss.  HENT:  Negative for congestion, sinus pain and sore throat.   Eyes: Negative.   Respiratory:  Positive for cough. Negative for hemoptysis, sputum production, shortness of breath and wheezing.   Cardiovascular:  Negative for chest pain, palpitations, orthopnea, claudication and leg swelling.  Gastrointestinal:  Positive for heartburn. Negative for abdominal pain, nausea and vomiting.  Genitourinary: Negative.   Musculoskeletal:  Negative for joint pain and myalgias.  Skin:  Negative for rash.  Neurological:  Negative for weakness.  Endo/Heme/Allergies: Negative.    Psychiatric/Behavioral: Negative.     Objective:   Vitals:   05/17/21 1514  BP: 136/82  Pulse: 82  Temp: (!) 97.4 F (36.3 C)  TempSrc: Oral  SpO2: 91%  Weight: 90.7 kg  Height: '5\' 2"'$  (1.575 m)    Physical Exam Constitutional:      General: She is not in acute distress.    Appearance: She is obese. She is not ill-appearing.  HENT:     Head: Normocephalic and atraumatic.     Nose: Nose normal.     Mouth/Throat:     Mouth: Mucous membranes are moist.     Pharynx: Oropharynx is clear.  Eyes:     General: No scleral icterus.    Conjunctiva/sclera: Conjunctivae normal.     Pupils: Pupils are equal, round, and reactive to light.  Cardiovascular:     Rate and Rhythm: Normal rate and regular rhythm.     Pulses: Normal pulses.     Heart sounds: Normal heart sounds. No murmur heard. Pulmonary:     Effort: Pulmonary effort is normal.     Breath sounds: Normal breath sounds. No wheezing, rhonchi or rales.  Abdominal:     General: Bowel sounds are normal.     Palpations: Abdomen is soft.  Musculoskeletal:     Right lower leg: No edema.     Left lower leg: No edema.  Lymphadenopathy:     Cervical: No cervical adenopathy.  Skin:    General: Skin is warm and dry.  Neurological:     General: No focal deficit present.     Mental Status: She is alert.   CBC    Component Value Date/Time   WBC 4.2 04/03/2021 1316   RBC 4.48 04/03/2021 1316   HGB 14.0 04/03/2021 1316   HCT 41.0 04/03/2021 1316   PLT 239 04/03/2021 1316   MCV 91.5 04/03/2021 1316   MCH 31.3 04/03/2021 1316   MCHC 34.1  04/03/2021 1316   RDW 12.8 04/03/2021 1316   LYMPHSABS 1.9 11/29/2020 1108   MONOABS 0.4 11/29/2020 1108   EOSABS 0.1 11/29/2020 1108   BASOSABS 0.1 11/29/2020 1108   BMP Latest Ref Rng & Units 04/03/2021 11/29/2020 10/28/2019  Glucose 70 - 99 mg/dL 109(H) 83 101(H)  BUN 8 - 23 mg/dL 24(H) 23 28(H)  Creatinine 0.44 - 1.00 mg/dL 1.10(H) 0.86 0.85  Sodium 135 - 145 mmol/L 139 139 138   Potassium 3.5 - 5.1 mmol/L 4.2 4.7 4.3  Chloride 98 - 111 mmol/L 104 104 102  CO2 22 - 32 mmol/L '26 30 29  '$ Calcium 8.9 - 10.3 mg/dL 9.3 9.2 9.5   Chest imaging: CT Chest 2020 Mediastinum/Nodes: There is no hilar or mediastinal adenopathy. The esophagus is grossly unremarkable. Subcentimeter right thyroid hypodense nodule. No mediastinal fluid collection.   Lungs/Pleura: There is no focal consolidation, pleural effusion, or pneumothorax. Faint scattered hazy densities, likely atelectatic changes. Atypical infiltrate is less likely. There is a 3 mm right lower lobe subpleural nodule (series 8, image 73). The central airways are patent.  PFT: No flowsheet data found.  Echo 06/11/2018: LV EF 60-65%. Grade 1 diastolic dysfunction. RV cavity is normal and normal systolic function.    Assessment & Plan:   Mild persistent reactive airway disease without complication  Post-nasal drainage  Cough  Gastroesophageal reflux disease without esophagitis  Lung nodule - Plan: CT CHEST WO CONTRAST  Discussion: Erin West is a 70 year old woman, never smoker with history of GERD, hypertension and DVT who returns to pulmonary clinic for cough.   Her cough is much improved on ICS/LABA therapy and ipratropium + fluticasone nasal spray therapy. She continues to have symptomatic reflux which I think is the main contributor to her cough.   She has referral to ENT for further evaluation of cough and she will call to schedule this appointment.   Her pulmonary function tests are normal today. She is to continue advair inhaler therapy and as needed albuterol. She is to continue fluticasone and ipratropium nasal sprays for nasal congestion/drainage.   We will consider allergy referral in the future if she continues to have issues with on going cough.   I have also recommended that she speak with her cardiologist about trialing off valsartan due to possible contribution to cough.   We will start  her on LABA/ICS inhaler therapy with Advair 115-21 MCG 2 puffs twice daily.  She can use albuterol 1 to 2 puffs every 4-6 hours as needed for shortness of breath, cough or wheezing.  She is to continue 40 mg daily of pantoprazole for GERD. She is to continue to sleep with the head of the bed elevated to reduce any nocturnal reflux symptoms.  Follow-up in 6 months after repeat CT Chest scan for pulmonary nodules.  Freda Jackson, MD [facility] Office: [phone]   Current Outpatient Medications:    acetaminophen (TYLENOL) 650 MG CR tablet, Take 650 mg by mouth every 8 (eight) hours as needed for pain., Disp: , Rfl:    albuterol (VENTOLIN HFA) 108 (90 Base) MCG/ACT inhaler, Inhale 2 puffs into the lungs every 4 (four) hours as needed for wheezing or shortness of breath., Disp: 1 each, Rfl: 2   ALPRAZolam (XANAX) 0.5 MG tablet, Take 1 tablet (0.5 mg total) by mouth at bedtime., Disp: 90 tablet, Rfl: 3   atorvastatin (LIPITOR) 20 MG tablet, TAKE 1 TABLET DAILY, Disp: 90 tablet, Rfl: 3   carvedilol (  COREG) 25 MG tablet, Take 1 tablet (25 mg total) by mouth 2 (two) times daily., Disp: 180 tablet, Rfl: 1   cetirizine (ZYRTEC) 10 MG tablet, Take 1 tablet (10 mg total) by mouth daily., Disp: 90 tablet, Rfl: 3   Cholecalciferol (VITAMIN D) 2000 UNITS CAPS, Take 2,000 Units by mouth daily. , Disp: , Rfl:    diltiazem (CARDIZEM CD) 120 MG 24 hr capsule, Take 1 capsule (120 mg total) by mouth daily., Disp: 90 capsule, Rfl: 3   fluticasone (FLONASE) 50 MCG/ACT nasal spray, Place 2 sprays into both nostrils daily., Disp: 16 g, Rfl: 6   fluticasone-salmeterol (ADVAIR HFA) 115-21 MCG/ACT inhaler, Inhale 2 puffs into the lungs 2 (two) times daily., Disp: 36 g, Rfl: 3   ipratropium (ATROVENT) 0.03 % nasal spray, Place 2 sprays into both nostrils every 12 (twelve) hours., Disp: 90 mL, Rfl: 3   losartan (COZAAR) 100 MG tablet, Take 0.5 tablets (50 mg total) by mouth daily., Disp: 45  tablet, Rfl: 3   montelukast (SINGULAIR) 10 MG tablet, Take 1 tablet (10 mg total) by mouth at bedtime., Disp: 90 tablet, Rfl: 3   Multiple Vitamin (MULTIVITAMIN) tablet, Take 1 tablet by mouth daily., Disp: , Rfl:    pantoprazole (PROTONIX) 20 MG tablet, Take 1 tablet (20 mg total) by mouth daily. (Patient taking differently: Take 40 mg by mouth daily.), Disp: 90 tablet, Rfl: 3   polyethylene glycol powder (GLYCOLAX/MIRALAX) powder, Take 17 g by mouth daily as needed. (Patient taking differently: Take 17 g by mouth daily as needed for mild constipation.), Disp: 3350 g, Rfl: 1   Probiotic Product (PROBIOTIC PO), Take 1 capsule by mouth daily. , Disp: , Rfl:

## 2021-05-17 NOTE — Patient Instructions (Addendum)
Continue ipratropium nasal spray, 2 sprays per nostril twice daily   Continue the flonase, 2 sprays per nostril daily   Continue Pantoprazole '40mg'$  daily before breakfast   Elevate the head of your bed 4-6 inches at night to reduce night time reflux issues   Continue advair inhaler 2 puffs twice daily for your reactive airways disease   We will schedule you for a follow up CT chest scan in January  Please call the ENT clinic to schedule your referral visit

## 2021-05-17 NOTE — Progress Notes (Signed)
Full PFT performed today. °

## 2021-05-18 LAB — PULMONARY FUNCTION TEST
DL/VA % pred: 105 %
DL/VA: 4.41 ml/min/mmHg/L
DLCO cor % pred: 108 %
DLCO cor: 19.67 ml/min/mmHg
DLCO unc % pred: 110 %
DLCO unc: 20.02 ml/min/mmHg
FEF 25-75 Post: 1.39 L/sec
FEF 25-75 Pre: 3.71 L/sec
FEF2575-%Change-Post: -62 %
FEF2575-%Pred-Post: 77 %
FEF2575-%Pred-Pre: 207 %
FEV1-%Change-Post: -30 %
FEV1-%Pred-Post: 92 %
FEV1-%Pred-Pre: 132 %
FEV1-Post: 1.93 L
FEV1-Pre: 2.76 L
FEV1FVC-%Change-Post: -33 %
FEV1FVC-%Pred-Pre: 115 %
FEV6-%Change-Post: 4 %
FEV6-%Pred-Post: 125 %
FEV6-%Pred-Pre: 119 %
FEV6-Post: 3.3 L
FEV6-Pre: 3.15 L
FEV6FVC-%Pred-Post: 104 %
FEV6FVC-%Pred-Pre: 104 %
FVC-%Change-Post: 4 %
FVC-%Pred-Post: 119 %
FVC-%Pred-Pre: 114 %
FVC-Post: 3.3 L
FVC-Pre: 3.15 L
Post FEV1/FVC ratio: 58 %
Post FEV6/FVC ratio: 100 %
Pre FEV1/FVC ratio: 88 %
Pre FEV6/FVC Ratio: 100 %
RV % pred: 80 %
RV: 1.68 L
TLC % pred: 141 %
TLC: 6.72 L

## 2021-05-26 ENCOUNTER — Ambulatory Visit (HOSPITAL_BASED_OUTPATIENT_CLINIC_OR_DEPARTMENT_OTHER): Payer: TRICARE For Life (TFL) | Admitting: Family

## 2021-05-31 ENCOUNTER — Encounter (HOSPITAL_BASED_OUTPATIENT_CLINIC_OR_DEPARTMENT_OTHER): Payer: Self-pay | Admitting: Family

## 2021-05-31 ENCOUNTER — Other Ambulatory Visit: Payer: Self-pay

## 2021-05-31 ENCOUNTER — Ambulatory Visit (INDEPENDENT_AMBULATORY_CARE_PROVIDER_SITE_OTHER): Payer: Medicare Other | Admitting: Family

## 2021-05-31 ENCOUNTER — Ambulatory Visit (INDEPENDENT_AMBULATORY_CARE_PROVIDER_SITE_OTHER): Payer: Medicare Other

## 2021-05-31 VITALS — BP 110/70 | HR 66 | Ht 62.0 in | Wt 200.0 lb

## 2021-05-31 DIAGNOSIS — F4321 Adjustment disorder with depressed mood: Secondary | ICD-10-CM

## 2021-05-31 DIAGNOSIS — R002 Palpitations: Secondary | ICD-10-CM | POA: Diagnosis not present

## 2021-05-31 DIAGNOSIS — I5189 Other ill-defined heart diseases: Secondary | ICD-10-CM | POA: Diagnosis not present

## 2021-05-31 DIAGNOSIS — R0609 Other forms of dyspnea: Secondary | ICD-10-CM

## 2021-05-31 DIAGNOSIS — R0600 Dyspnea, unspecified: Secondary | ICD-10-CM | POA: Diagnosis not present

## 2021-05-31 MED ORDER — LOSARTAN POTASSIUM 25 MG PO TABS: 25.0000 mg | ORAL_TABLET | Freq: Every day | ORAL | 2 refills | Status: DC

## 2021-05-31 NOTE — Patient Instructions (Addendum)
Medication Instructions:  Your physician has recommended you make the following change in your medication:   CHANGE Losartan to one '25mg'$  tablet daily  *If you need a refill on your cardiac medications before your next appointment, please call your pharmacy*  Lab Work: None ordered today   Testing/Procedures: Your physician has requested that you have an echocardiogram. Echocardiography is a painless test that uses sound waves to create images of your heart. It provides your doctor with information about the size and shape of your heart and how well your heart's chambers and valves are working. This procedure takes approximately one hour. There are no restrictions for this procedure.   Your physician has recommended that you wear a Zio monitor.   This monitor is a medical device that records the heart's electrical activity. Doctors most often use these monitors to diagnose arrhythmias. Arrhythmias are problems with the speed or rhythm of the heartbeat. The monitor is a small device applied to your chest. You can wear one while you do your normal daily activities. While wearing this monitor if you have any symptoms to push the button and record what you felt. Once you have worn this monitor for the period of time provider prescribed (For 7 days, you will return the monitor device in the postage paid box. Once it is returned they will download the data collected and provide [place] with a report which the provider will then review and we will call you with those results. Important tips:  Avoid showering during the first 24 hours of wearing the monitor. Avoid excessive sweating to help maximize wear time. Do not submerge the device, no hot tubs, and no swimming pools. Keep any lotions or oils away from the patch. After 24 hours you may shower with the patch on. Take brief showers with your back facing the shower head.  Do not remove patch once it has been placed because that will interrupt data and  decrease adhesive wear time. Push the button when you have any symptoms and write down what you were feeling. Once you have completed wearing your monitor, remove and place into box which has postage paid and place in your outgoing mailbox.  If for some reason you have misplaced your box then call our office and we can provide another box and/or mail it off for you.  Follow-Up: At [facility], you and your health needs are our priority.  As part of our continuing mission to provide you with exceptional heart care, we have created designated Provider Care Teams.  These Care Teams include your primary Cardiologist (physician) and Advanced Practice Providers (APPs -  Physician Assistants and Nurse Practitioners) who all work together to provide you with the care you need, when you need it.  We recommend signing up for the patient portal called "MyChart".  Sign up information is provided on this After Visit Summary.  MyChart is used to connect with patients for Virtual Visits (Telemedicine).  Patients are able to view lab/test results, encounter notes, upcoming appointments, etc.  Non-urgent messages can be sent to your provider as well.   To learn more about what you can do with MyChart, go to [email or web address].    Your next appointment:   As scheduled with Dr. Stanford Breed  Other Instructions  You have been referred to psychology today. Mental health is an important part of heart health. You have been referred to Dr. Elias Else. He is located at [facility] at [facility]. They will contact  you to schedule an appointment. If you have not heard from them in 1 week, you may call [phone] to schedule an appointment.   To prevent palpitations: Make sure you are adequately hydrated.  Avoid and/or limit caffeine containing beverages like soda or tea. Exercise regularly.  Manage stress well. Some over the counter medications can cause palpitations such as Benadryl,  AdvilPM, TylenolPM. Regular Advil or Tylenol do not cause palpitations.

## 2021-05-31 NOTE — Progress Notes (Signed)
Office Visit    Patient Name: Erin West Date of Encounter: 05/31/2021  PCP:  Leamon Arnt, [place]  Cardiologist:  [name], MD  Advanced Practice Provider:  No care team member to display Electrophysiologist:  None    Chief Complaint    Erin West is a 70 y.o. female with a hx of palpitations, HTN, HLD presents today for follow up after addition of diltiazem.   Past Medical History    Past Medical History:  Diagnosis Date   Anxiety    Arthritis    Aspiration pneumonia ([place]) 2013   Atypical chest pain    Stress test 04/21/10 - post-stress EF=89%. Normal scan.   Diarrhea    Diastolic dysfunction    But normal LV function on ECHO 2011 and low risk Myoview in 2011   DVT (deep venous thrombosis) (HCC)    Dyslipidemia    GERD (gastroesophageal reflux disease)    Hypertension    Osteoarthritis    Osteopenia    Palpitations    [employer] MCT Monitor 04/16/10-04/22/10    Syncope    Pain-mediated syncope   Past Surgical History:  Procedure Laterality Date   CHOLECYSTECTOMY  2006   KNEE ARTHROSCOPY Right    REPLACEMENT TOTAL KNEE Left    left   REPLACEMENT TOTAL KNEE Right    TUBAL LIGATION  06/1978    Allergies  Allergies  Allergen Reactions   Penicillins Swelling and Rash    Has patient had a PCN reaction causing immediate rash, facial/tongue/throat swelling, SOB or lightheadedness with hypotension: No Has patient had a PCN reaction causing severe rash involving mucus membranes or skin necrosis: No Has patient had a PCN reaction that required hospitalization unknown Has patient had a PCN reaction occurring within the last 10 years: No If all of the above answers are "NO", then may proceed with Cephalosporin use.    Sulfa Antibiotics Swelling and Rash   Sulfasalazine Rash and Swelling   Tramadol Other (See Comments)    Rapid heart rate   Ciprofloxacin     Muscular pain   Trimethoprim     rash   Isovue  [Iopamidol] Hives    Pt had sneezing, itchy throat, a couple of hives and swollen, itchy left eye. Pt was given 50 mg po benadryl, and water.  Dr Carlis Abbott checked pt.  We observed pt for 30 mins w/ 5 minute BP checks.  Pt left w/o complication.  Pt will need full premeds in the future.  Alfonse Alpers, RTRCT    History of Present Illness    Erin West is a 70 y.o. female with a hx of palpitations, coronary artery calcification by CT,  HTN, HLD last seen 04/14/21  She follows closely with Dr. Stanford Breed due to palpitations. Nuclear study July 2011 with normal LVEF and no ischemic. Event monitor July 2011 with NSR. Echo 05/2018 normal LVEF, mild diastolic dysfunction. Monitor 06/2018 with NSR with PVC's.   She was evaluated in the ED 04/03/21 due to palpitations. Noted sensation of heart skipping beats. Her EKG showed NSR. She was given IVF due to soft blood pressure. HS troponin negative, K 4.2, Hb 14. CT chest with aortic atherosclerosis, 3 vessel coronary vascular calcification, several faint ground glass nodules recommended for non-contrast CT at 3-6 months.  Seen in follow up 04/14/21 due to palpitations. Diltiazem '120mg'$  daily was added and Losartan reduced to '50mg'$  to prevent hypotension. Tells me she stopped taking  Diltiazem after 2 weeks as she felt drained. Still notes daily palpitations. Tells me they last a few seconds and then resolves. Continues to participate in water classes for exercise. She does not notice palpitations with these exercise. Has had a couple episodes of low blood pressure since last seen with systolic readings in the 0000000.   EKGs/Labs/Other Studies Reviewed:   The following studies were reviewed today:  CT chest 04/06/21  IMPRESSION: 1. No consolidative changes. 2. Several faint ground-glass nodules in the lingula may represent areas of atelectasis or less likely developing atypical infection. Non-contrast chest CT at 3-6 months is recommended. If the nodules are stable at time  of repeat CT, then future CT at 18-24 months (from today's scan) is considered optional for low-risk patients, but is recommended for high-risk patients. This recommendation follows the consensus statement: Guidelines for Management of Incidental Pulmonary Nodules Detected on CT Images: From the Fleischner Society 2017; Radiology 2017; 284:228-243. 3. Aortic Atherosclerosis (ICD10-I70.0).    EKG:   No EKG is ordered today.  The ekg independently reviewed from 04/03/2021 demonstrated normal sinus rhythm 90 bpm with occasional PVC.   Recent Labs: 11/29/2020: ALT 22; TSH 2.56 04/03/2021: BUN 24; Creatinine, Ser 1.10; Hemoglobin 14.0; Platelets 239; Potassium 4.2; Sodium 139  Recent Lipid Panel    Component Value Date/Time   CHOL 180 11/29/2020 1108   TRIG 56.0 11/29/2020 1108   HDL 71.00 11/29/2020 1108   CHOLHDL 3 11/29/2020 1108   VLDL 11.2 11/29/2020 1108   LDLCALC 98 11/29/2020 1108   Home Medications   Current Meds  Medication Sig   acetaminophen (TYLENOL) 650 MG CR tablet Take 650 mg by mouth every 8 (eight) hours as needed for pain.   albuterol (VENTOLIN HFA) 108 (90 Base) MCG/ACT inhaler Inhale 2 puffs into the lungs every 4 (four) hours as needed for wheezing or shortness of breath.   ALPRAZolam (XANAX) 0.5 MG tablet Take 1 tablet (0.5 mg total) by mouth at bedtime.   atorvastatin (LIPITOR) 20 MG tablet TAKE 1 TABLET DAILY   carvedilol (COREG) 25 MG tablet Take 1 tablet (25 mg total) by mouth 2 (two) times daily.   cetirizine (ZYRTEC) 10 MG tablet Take 1 tablet (10 mg total) by mouth daily.   Cholecalciferol (VITAMIN D) 2000 UNITS CAPS Take 2,000 Units by mouth daily.    fluticasone-salmeterol (ADVAIR HFA) 115-21 MCG/ACT inhaler Inhale 2 puffs into the lungs 2 (two) times daily.   ipratropium (ATROVENT) 0.03 % nasal spray Place 2 sprays into both nostrils every 12 (twelve) hours.   losartan (COZAAR) 25 MG tablet Take 1 tablet (25 mg total) by mouth daily.   montelukast  (SINGULAIR) 10 MG tablet Take 1 tablet (10 mg total) by mouth at bedtime.   Multiple Vitamin (MULTIVITAMIN) tablet Take 1 tablet by mouth daily.   pantoprazole (PROTONIX) 20 MG tablet Take 1 tablet (20 mg total) by mouth daily.   polyethylene glycol powder (GLYCOLAX/MIRALAX) powder Take 17 g by mouth daily as needed.   Probiotic Product (PROBIOTIC PO) Take 1 capsule by mouth daily.    [DISCONTINUED] losartan (COZAAR) 100 MG tablet Take 0.5 tablets (50 mg total) by mouth daily.     Review of Systems      All other systems reviewed and are otherwise negative except as noted above.  Physical Exam    VS:  BP 110/70 (BP Location: Left Arm, Patient Position: Sitting, Cuff Size: Normal)   Pulse 66   Ht '5\' 2"'$  (1.575 m)  Wt 200 lb (90.7 kg)   LMP 09/24/2012 (Approximate)   SpO2 92%   BMI 36.58 kg/m  , BMI Body mass index is 36.58 kg/m.  Wt Readings from Last 3 Encounters:  05/31/21 200 lb (90.7 kg)  05/17/21 200 lb (90.7 kg)  04/14/21 200 lb (90.7 kg)     GEN: Well nourished, well developed, in no acute distress. HEENT: normal. Neck: Supple, no JVD, carotid bruits, or masses. Cardiac: RRR, no murmurs, rubs, or gallops. No clubbing, cyanosis, edema.  Radials/PT 2+ and equal bilaterally.  Respiratory:  Respirations regular and unlabored, clear to auscultation bilaterally. GI: Soft, nontender, nondistended. MS: No deformity or atrophy. Skin: Warm and dry, no rash. Neuro:  Strength and sensation are intact. Psych: Normal affect.  Assessment & Plan    Palpitations - Previous monitor 2019 with infrequent PVC. Recent ED visit with normal CBC, BMP. 11/30/20 TSH 2.56.  Anticipate stress and dehydration are most contributory to her symptoms. Did not tolerate Diltiazem with fatigue. To rule out significant arrhythmia or valvular etiology plan for 7-day ZIO XT monitor and echocardiogram. She will continue Coreg '25mg'$  BID.   HTN - BP low normal with hypotensive readings at home.  Continue Toprol  at 25 mg twice daily.  Reduce losartan to 25 mg daily. Per pulmonology may be beneficial to trial off Losartan due to persistent cough to assess if contributory. Pending BP at follow up, could consider discontinuing.   Coronary artery calcification by CT - Stable with no anginal symptoms. No indication for ischemic evaluation.  GDMT includes atorvastatin, carvedilol. Heart healthy diet and regular cardiovascular exercise encouraged.    Ground glass nodules by CT - Followed by pulmonology.  HLD - 11/29/20 total cholesterol 180, HDL 71, LDL 98, triglycerides 56. Continue Atorvastatin '20mg'$  QD. Consider increased dose Atorvastatin at follow up. Hesitant to make multiple medications changes in one visit.   Grief -Understandably still grieving the loss of her husband 1 year ago.  She is interesting in seeking counseling services.  Referral placed to Dr. Elias Else, PsyD  Disposition: Follow up in November with Dr. Stanford Breed Signed, Loel Dubonnet, NP 05/31/2021, 12:53 PM [place]

## 2021-06-03 ENCOUNTER — Encounter: Payer: Self-pay | Admitting: Family Medicine

## 2021-06-09 ENCOUNTER — Other Ambulatory Visit (HOSPITAL_BASED_OUTPATIENT_CLINIC_OR_DEPARTMENT_OTHER): Payer: TRICARE For Life (TFL)

## 2021-06-09 ENCOUNTER — Telehealth: Payer: Self-pay | Admitting: Cardiology

## 2021-06-09 DIAGNOSIS — J3489 Other specified disorders of nose and nasal sinuses: Secondary | ICD-10-CM | POA: Diagnosis not present

## 2021-06-09 DIAGNOSIS — K219 Gastro-esophageal reflux disease without esophagitis: Secondary | ICD-10-CM | POA: Diagnosis not present

## 2021-06-09 DIAGNOSIS — J4530 Mild persistent asthma, uncomplicated: Secondary | ICD-10-CM | POA: Diagnosis not present

## 2021-06-09 DIAGNOSIS — J45909 Unspecified asthma, uncomplicated: Secondary | ICD-10-CM | POA: Insufficient documentation

## 2021-06-09 NOTE — Telephone Encounter (Signed)
Attempt to return call to patient, left message to call back.

## 2021-06-09 NOTE — Telephone Encounter (Signed)
Pt saw Dr. West Carbo, Shanon Brow this morning and was advised that pt should speak with Dr. Stanford Breed about prescribing a different medicine other than Carvedilol 25 Mg

## 2021-06-09 NOTE — Telephone Encounter (Signed)
Pt is returning call to [facility]. Please advise pt further

## 2021-06-12 NOTE — Telephone Encounter (Signed)
Spoke to Erin West reports she saw Dr. West Carbo (ENT) on Friday for a cough that she has had for 3 years.  She state she has seen multiple doctors and they are trying to figure out the cause of the cough.   Dr. West Carbo recommended trialing her off of the Losartan to see if this improves her symptoms.   He recommended speaking to Dr. Stanford Breed about this.     Reports BP at home typically 120s/70s, has been as low as 90/60.   BP at office Friday was 143/96 HR 77   Advised would send to Dr. Stanford Breed to review for recommendations.

## 2021-06-13 NOTE — Telephone Encounter (Signed)
Spoke with pt, aware okay to stop the losartan to see if the cough gets better but it may take up to 3-4 weeks for the cough to improve if related to the losartan. She will track her blood pressure and let us know how things are going. She has a follow up appointment in November.

## 2021-06-16 ENCOUNTER — Ambulatory Visit (INDEPENDENT_AMBULATORY_CARE_PROVIDER_SITE_OTHER): Payer: Medicare Other

## 2021-06-16 ENCOUNTER — Other Ambulatory Visit: Payer: Self-pay

## 2021-06-16 DIAGNOSIS — R0600 Dyspnea, unspecified: Secondary | ICD-10-CM | POA: Diagnosis not present

## 2021-06-16 DIAGNOSIS — R002 Palpitations: Secondary | ICD-10-CM | POA: Diagnosis not present

## 2021-06-16 DIAGNOSIS — I5189 Other ill-defined heart diseases: Secondary | ICD-10-CM | POA: Diagnosis not present

## 2021-06-16 DIAGNOSIS — R0609 Other forms of dyspnea: Secondary | ICD-10-CM

## 2021-06-16 LAB — ECHOCARDIOGRAM COMPLETE
Area-P 1/2: 3.77 cm2
P 1/2 time: 521 msec
S' Lateral: 2.22 cm

## 2021-06-28 ENCOUNTER — Ambulatory Visit: Payer: Medicare Other | Admitting: Psychologist

## 2021-07-13 DIAGNOSIS — Z20822 Contact with and (suspected) exposure to covid-19: Secondary | ICD-10-CM | POA: Diagnosis not present

## 2021-07-18 DIAGNOSIS — R29898 Other symptoms and signs involving the musculoskeletal system: Secondary | ICD-10-CM | POA: Diagnosis not present

## 2021-07-18 DIAGNOSIS — S93492A Sprain of other ligament of left ankle, initial encounter: Secondary | ICD-10-CM | POA: Diagnosis not present

## 2021-07-18 DIAGNOSIS — M79672 Pain in left foot: Secondary | ICD-10-CM | POA: Diagnosis not present

## 2021-07-18 DIAGNOSIS — S93409A Sprain of unspecified ligament of unspecified ankle, initial encounter: Secondary | ICD-10-CM | POA: Insufficient documentation

## 2021-07-18 DIAGNOSIS — M6289 Other specified disorders of muscle: Secondary | ICD-10-CM | POA: Insufficient documentation

## 2021-07-28 ENCOUNTER — Ambulatory Visit (INDEPENDENT_AMBULATORY_CARE_PROVIDER_SITE_OTHER): Payer: Medicare Other | Admitting: Psychologist

## 2021-07-28 ENCOUNTER — Other Ambulatory Visit: Payer: Self-pay

## 2021-07-28 DIAGNOSIS — F320 Major depressive disorder, single episode, mild: Secondary | ICD-10-CM | POA: Diagnosis not present

## 2021-07-31 DIAGNOSIS — S93492A Sprain of other ligament of left ankle, initial encounter: Secondary | ICD-10-CM | POA: Diagnosis not present

## 2021-08-02 DIAGNOSIS — S93492A Sprain of other ligament of left ankle, initial encounter: Secondary | ICD-10-CM | POA: Diagnosis not present

## 2021-08-07 ENCOUNTER — Ambulatory Visit: Payer: TRICARE For Life (TFL) | Admitting: Psychologist

## 2021-08-07 DIAGNOSIS — S93492A Sprain of other ligament of left ankle, initial encounter: Secondary | ICD-10-CM | POA: Diagnosis not present

## 2021-08-08 DIAGNOSIS — Z1211 Encounter for screening for malignant neoplasm of colon: Secondary | ICD-10-CM | POA: Diagnosis not present

## 2021-08-08 DIAGNOSIS — K5904 Chronic idiopathic constipation: Secondary | ICD-10-CM | POA: Diagnosis not present

## 2021-08-08 DIAGNOSIS — K219 Gastro-esophageal reflux disease without esophagitis: Secondary | ICD-10-CM | POA: Diagnosis not present

## 2021-08-10 DIAGNOSIS — S93492A Sprain of other ligament of left ankle, initial encounter: Secondary | ICD-10-CM | POA: Diagnosis not present

## 2021-08-14 ENCOUNTER — Ambulatory Visit: Payer: TRICARE For Life (TFL)

## 2021-08-21 ENCOUNTER — Ambulatory Visit: Payer: Medicare Other | Admitting: Cardiology

## 2021-08-23 DIAGNOSIS — S93492A Sprain of other ligament of left ankle, initial encounter: Secondary | ICD-10-CM | POA: Diagnosis not present

## 2021-08-28 DIAGNOSIS — S93492A Sprain of other ligament of left ankle, initial encounter: Secondary | ICD-10-CM | POA: Diagnosis not present

## 2021-09-01 DIAGNOSIS — S93492A Sprain of other ligament of left ankle, initial encounter: Secondary | ICD-10-CM | POA: Diagnosis not present

## 2021-09-04 ENCOUNTER — Other Ambulatory Visit (HOSPITAL_BASED_OUTPATIENT_CLINIC_OR_DEPARTMENT_OTHER): Payer: Self-pay | Admitting: Family

## 2021-09-06 ENCOUNTER — Ambulatory Visit: Payer: Medicare Other | Admitting: Family Medicine

## 2021-09-11 DIAGNOSIS — S93492A Sprain of other ligament of left ankle, initial encounter: Secondary | ICD-10-CM | POA: Diagnosis not present

## 2021-09-14 ENCOUNTER — Ambulatory Visit: Payer: Medicare Other | Admitting: Obstetrics and Gynecology

## 2021-09-20 ENCOUNTER — Other Ambulatory Visit: Payer: Self-pay

## 2021-09-20 ENCOUNTER — Encounter: Payer: Self-pay | Admitting: Family Medicine

## 2021-09-20 ENCOUNTER — Ambulatory Visit (INDEPENDENT_AMBULATORY_CARE_PROVIDER_SITE_OTHER): Payer: Medicare Other | Admitting: Family Medicine

## 2021-09-20 VITALS — BP 130/76 | HR 68 | Temp 98.2°F | Ht 62.0 in | Wt 199.0 lb

## 2021-09-20 DIAGNOSIS — I10 Essential (primary) hypertension: Secondary | ICD-10-CM | POA: Diagnosis not present

## 2021-09-20 DIAGNOSIS — Z79899 Other long term (current) drug therapy: Secondary | ICD-10-CM

## 2021-09-20 DIAGNOSIS — J302 Other seasonal allergic rhinitis: Secondary | ICD-10-CM | POA: Diagnosis not present

## 2021-09-20 DIAGNOSIS — K219 Gastro-esophageal reflux disease without esophagitis: Secondary | ICD-10-CM | POA: Diagnosis not present

## 2021-09-20 DIAGNOSIS — F321 Major depressive disorder, single episode, moderate: Secondary | ICD-10-CM

## 2021-09-20 DIAGNOSIS — R053 Chronic cough: Secondary | ICD-10-CM

## 2021-09-20 MED ORDER — ATORVASTATIN CALCIUM 20 MG PO TABS: 20.0000 mg | ORAL_TABLET | Freq: Every day | ORAL | 3 refills | Status: DC

## 2021-09-20 MED ORDER — ALPRAZOLAM 0.5 MG PO TABS: 0.5000 mg | ORAL_TABLET | Freq: Every day | ORAL | 1 refills | Status: DC

## 2021-09-20 MED ORDER — LOSARTAN POTASSIUM 25 MG PO TABS: 25.0000 mg | ORAL_TABLET | Freq: Every day | ORAL | 3 refills | Status: DC

## 2021-09-20 MED ORDER — MONTELUKAST SODIUM 10 MG PO TABS: 10.0000 mg | ORAL_TABLET | Freq: Every day | ORAL | 3 refills | Status: DC

## 2021-09-20 NOTE — Progress Notes (Signed)
Subjective  CC:  Chief Complaint  Patient presents with   Hypertension    130/80's range at home    HPI: Erin West is a 70 y.o. female who presents to the office today to address the problems listed above in the chief complaint. Hypertension f/u: Control is good . Pt reports she is doing well. Had low blood pressures several months ago and cards adjusted down the dose of losartan: now taking 25mg  daily (pt reported).  taking medications as instructed, no medication side effects noted, no TIAs, no chest pain on exertion, no dyspnea on exertion, no swelling of ankles. She denies adverse effects from his BP medications. Compliance with medication is good. Has never stopped ARB to see if would help cough: Chronic cough: evaluated by pulm, and more recently ENT. Consider stopping ARB but pt is hesitant. Management includes allergy meds, GERD tx.  GI: Had recent visit with Dr. Collene Mares, GI.  Added another medication for reflux.  LPR contributing to cough.  Reflux is now better controlled, chronic PPI. Grief reaction and depression: Improving.  Starting to move towards acceptance of the loss of her husband.  It has been 16 months now.  Still using Xanax at night to help initiate sleep.  Assessment  1. Essential hypertension   2. Depression, major, single episode, moderate ([place])   3. Chronic prescription benzodiazepine use   4. Chronic cough   5. Chronic seasonal allergic rhinitis   6. Laryngopharyngeal reflux (LPR)   7. Gastroesophageal reflux disease without esophagitis      Plan   Hypertension f/u: BP control is well controlled.  She will follow-up with cardiology.  Consider stopping losartan to see if helps cough. Mood f/u: Recent depression: Now improving.  Continue Xanax nightly for now.  Could start the discussion of weaning at some point in the future Chronic cough due to allergies, LPR, GERD and possibly ARB.  No new treatments.  Consideration of starting ARB discussed.   Stable GERD: Has follow-up for colonoscopy with GI.  Continue reflux measures and medications  Education regarding management of these chronic disease states was given. Management strategies discussed on successive visits include dietary and exercise recommendations, goals of achieving and maintaining IBW, and lifestyle modifications aiming for adequate sleep and minimizing stressors.   Follow up: 3 mo for cpe and labs  No orders of the defined types were placed in this encounter.  Meds ordered this encounter  Medications   atorvastatin (LIPITOR) 20 MG tablet    Sig: Take 1 tablet (20 mg total) by mouth at bedtime.    Dispense:  90 tablet    Refill:  3   losartan (COZAAR) 25 MG tablet    Sig: Take 1 tablet (25 mg total) by mouth daily.    Dispense:  90 tablet    Refill:  3   ALPRAZolam (XANAX) 0.5 MG tablet    Sig: Take 1 tablet (0.5 mg total) by mouth at bedtime.    Dispense:  90 tablet    Refill:  1   montelukast (SINGULAIR) 10 MG tablet    Sig: Take 1 tablet (10 mg total) by mouth at bedtime.    Dispense:  90 tablet    Refill:  3      BP Readings from Last 3 Encounters:  09/20/21 130/76  05/31/21 110/70  05/17/21 136/82   Wt Readings from Last 3 Encounters:  09/20/21 199 lb (90.3 kg)  05/31/21 200 lb (90.7 kg)  05/17/21 200 lb (  90.7 kg)    Lab Results  Component Value Date   CHOL 180 11/29/2020   CHOL 163 10/28/2019   CHOL 157 10/22/2018   Lab Results  Component Value Date   HDL 71.00 11/29/2020   HDL 53.00 10/28/2019   HDL 51.80 10/22/2018   Lab Results  Component Value Date   LDLCALC 98 11/29/2020   LDLCALC 94 10/28/2019   LDLCALC 90 10/22/2018   Lab Results  Component Value Date   TRIG 56.0 11/29/2020   TRIG 80.0 10/28/2019   TRIG 73.0 10/22/2018   Lab Results  Component Value Date   CHOLHDL 3 11/29/2020   CHOLHDL 3 10/28/2019   CHOLHDL 3 10/22/2018   No results found for: LDLDIRECT Lab Results  Component Value Date   CREATININE 1.10 (H)  04/03/2021   BUN 24 (H) 04/03/2021   NA 139 04/03/2021   K 4.2 04/03/2021   CL 104 04/03/2021   CO2 26 04/03/2021    The 10-year ASCVD risk score (Arnett DK, et al., 2019) is: 19%   Values used to calculate the score:     Age: 36 years     Sex: Female     Is Non-Hispanic African American: No     Diabetic: No     Tobacco smoker: Yes     Systolic Blood Pressure: 756 mmHg     Is BP treated: Yes     HDL Cholesterol: 71 mg/dL     Total Cholesterol: 180 mg/dL  I reviewed the patients updated PMH, FH, and SocHx.    Patient Active Problem List   Diagnosis Date Noted   Chronic prescription benzodiazepine use 09/20/2021    Priority: High   Depression, major, single episode, moderate ([place]) 11/02/2020    Priority: High   Mixed hyperlipidemia 12/04/2017    Priority: High   Chronic cough 07/24/2017    Priority: High   Essential hypertension     Priority: High   OAB (overactive bladder) 10/28/2019    Priority: Medium    Chronic seasonal allergic rhinitis 12/04/2017    Priority: Medium    Chronic pansinusitis 09/09/2017    Priority: Medium    Laryngopharyngeal reflux (LPR) 09/09/2017    Priority: Medium    Bilateral leg edema 03/30/2014    Priority: Medium    History of gastritis 02/10/2014    Priority: Medium    Dysphagia 01/28/2014    Priority: Medium    Irritable bowel syndrome (IBS) 09/08/2013    Priority: Medium    Osteopenia 07/14/2013    Priority: Medium    Osteoarthritis, multiple sites 07/14/2013    Priority: Medium    Gastroesophageal reflux disease without esophagitis     Priority: Medium    Atrophic vaginitis 10/22/2018    Priority: Low   Status post total right knee replacement 01/24/2016    Priority: Low    Allergies: Penicillins, Sulfa antibiotics, Sulfasalazine, Tramadol, Ciprofloxacin, Trimethoprim, and Isovue [iopamidol]  Social History: Patient  reports that she has never smoked. She has never used smokeless tobacco. She reports current alcohol use.  She reports that she does not use drugs.  Current Meds  Medication Sig   acetaminophen (TYLENOL) 650 MG CR tablet Take 650 mg by mouth every 8 (eight) hours as needed for pain.   albuterol (VENTOLIN HFA) 108 (90 Base) MCG/ACT inhaler Inhale 2 puffs into the lungs every 4 (four) hours as needed for wheezing or shortness of breath.   carvedilol (COREG) 25 MG tablet Take 1 tablet (25 mg  total) by mouth 2 (two) times daily.   cetirizine (ZYRTEC) 10 MG tablet Take 1 tablet (10 mg total) by mouth daily.   Cholecalciferol (VITAMIN D) 2000 UNITS CAPS Take 2,000 Units by mouth daily.    fluticasone-salmeterol (ADVAIR HFA) 115-21 MCG/ACT inhaler Inhale 2 puffs into the lungs 2 (two) times daily.   ipratropium (ATROVENT) 0.03 % nasal spray Place 2 sprays into both nostrils every 12 (twelve) hours.   losartan (COZAAR) 25 MG tablet Take 1 tablet (25 mg total) by mouth daily.   Multiple Vitamin (MULTIVITAMIN) tablet Take 1 tablet by mouth daily.   pantoprazole (PROTONIX) 20 MG tablet Take 1 tablet (20 mg total) by mouth daily.   polyethylene glycol powder (GLYCOLAX/MIRALAX) powder Take 17 g by mouth daily as needed.   Probiotic Product (PROBIOTIC PO) Take 1 capsule by mouth daily.    [DISCONTINUED] ALPRAZolam (XANAX) 0.5 MG tablet Take 1 tablet (0.5 mg total) by mouth at bedtime.   [DISCONTINUED] atorvastatin (LIPITOR) 20 MG tablet TAKE 1 TABLET DAILY   [DISCONTINUED] montelukast (SINGULAIR) 10 MG tablet Take 1 tablet (10 mg total) by mouth at bedtime.    Review of Systems: Cardiovascular: negative for chest pain, palpitations, leg swelling, orthopnea Respiratory: negative for SOB, wheezing or persistent cough Gastrointestinal: negative for abdominal pain Genitourinary: negative for dysuria or gross hematuria  Objective  Vitals: BP 130/76    Pulse 68    Temp 98.2 F (36.8 C) (Temporal)    Ht 5\' 2"  (1.575 m)    Wt 199 lb (90.3 kg)    LMP 09/24/2012 (Approximate)    SpO2 98%    BMI 36.40 kg/m   General: no acute distress  Psych:  Alert and oriented, brighter mood and affect today HEENT:  Normocephalic, atraumatic, supple neck  Cardiovascular:  RRR without murmur. no edema Respiratory:  Good breath sounds bilaterally, CTAB with normal respiratory effort Skin:  Warm, no rashes Neurologic:   Mental status is normal Commons side effects, risks, benefits, and alternatives for medications and treatment plan prescribed today were discussed, and the patient expressed understanding of the given instructions. Patient is instructed to call or message via MyChart if he/she has any questions or concerns regarding our treatment plan. No barriers to understanding were identified. We discussed Red Flag symptoms and signs in detail. Patient expressed understanding regarding what to do in case of urgent or emergency type symptoms.  Medication list was reconciled, printed and provided to the patient in AVS. Patient instructions and summary information was reviewed with the patient as documented in the AVS. This note was prepared with assistance of Dragon voice recognition software. Occasional wrong-word or sound-a-like substitutions may have occurred due to the inherent limitations of voice recognition software  This visit occurred during the SARS-CoV-2 public health emergency.  Safety protocols were in place, including screening questions prior to the visit, additional usage of staff PPE, and extensive cleaning of exam room while observing appropriate contact time as indicated for disinfecting solutions.

## 2021-09-20 NOTE — Patient Instructions (Signed)
Please return in 3 months for your annual complete physical; please come fasting.   If you have any questions or concerns, please don't hesitate to send me a message via MyChart or call the office at [phone]. Thank you for visiting with us today! It's our pleasure caring for you.  

## 2021-09-27 DIAGNOSIS — M19012 Primary osteoarthritis, left shoulder: Secondary | ICD-10-CM | POA: Diagnosis not present

## 2021-09-27 DIAGNOSIS — M25512 Pain in left shoulder: Secondary | ICD-10-CM | POA: Diagnosis not present

## 2021-09-27 DIAGNOSIS — M19019 Primary osteoarthritis, unspecified shoulder: Secondary | ICD-10-CM | POA: Insufficient documentation

## 2021-09-27 NOTE — Progress Notes (Deleted)
71 y.o. G57P3 Widowed Caucasian female here for annual exam.    PCP:     Patient's last menstrual period was 09/24/2012 (approximate).           Sexually active: {yes no:314532}  The current method of family planning is tubal ligation.    Exercising: {yes no:314532}  {types:19826} Smoker:  no  Health Maintenance: Pap:   10-15-15  Neg:Neg HR HPV. History of abnormal Pap:  {YES NO:22349} MMG:  12-14-20 Neg/BiRads1 Colonoscopy:  ***2017  BMD:  12-14-20  Result : Osteopenia TDaP:  12-19-15 Gardasil:   no HIV:*** Hep C:*** Screening Labs:  Hb today: ***, Urine today: ***   reports that she has never smoked. She has never used smokeless tobacco. She reports current alcohol use. She reports that she does not use drugs.  Past Medical History:  Diagnosis Date   Anxiety    Arthritis    Aspiration pneumonia ([place]) 2013   Atypical chest pain    Stress test 04/21/10 - post-stress EF=89%. Normal scan.   Diarrhea    Diastolic dysfunction    But normal LV function on ECHO 2011 and low risk Myoview in 2011   DVT (deep venous thrombosis) (HCC)    Dyslipidemia    GERD (gastroesophageal reflux disease)    Hypertension    Osteoarthritis    Osteopenia    Palpitations    Biowatch MCT Monitor 04/16/10-04/22/10    Syncope    Pain-mediated syncope    Past Surgical History:  Procedure Laterality Date   CHOLECYSTECTOMY  2006   KNEE ARTHROSCOPY Right    REPLACEMENT TOTAL KNEE Left    left   REPLACEMENT TOTAL KNEE Right    TUBAL LIGATION  06/1978    Current Outpatient Medications  Medication Sig Dispense Refill   acetaminophen (TYLENOL) 650 MG CR tablet Take 650 mg by mouth every 8 (eight) hours as needed for pain.     albuterol (VENTOLIN HFA) 108 (90 Base) MCG/ACT inhaler Inhale 2 puffs into the lungs every 4 (four) hours as needed for wheezing or shortness of breath. 1 each 2   ALPRAZolam (XANAX) 0.5 MG tablet Take 1 tablet (0.5 mg total) by mouth at bedtime. 90 tablet 1   atorvastatin  (LIPITOR) 20 MG tablet Take 1 tablet (20 mg total) by mouth at bedtime. 90 tablet 3   carvedilol (COREG) 25 MG tablet Take 1 tablet (25 mg total) by mouth 2 (two) times daily. 180 tablet 1   cetirizine (ZYRTEC) 10 MG tablet Take 1 tablet (10 mg total) by mouth daily. 90 tablet 3   Cholecalciferol (VITAMIN D) 2000 UNITS CAPS Take 2,000 Units by mouth daily.      fluticasone-salmeterol (ADVAIR HFA) 115-21 MCG/ACT inhaler Inhale 2 puffs into the lungs 2 (two) times daily. 36 g 3   ipratropium (ATROVENT) 0.03 % nasal spray Place 2 sprays into both nostrils every 12 (twelve) hours. 90 mL 3   losartan (COZAAR) 25 MG tablet Take 1 tablet (25 mg total) by mouth daily. 90 tablet 3   montelukast (SINGULAIR) 10 MG tablet Take 1 tablet (10 mg total) by mouth at bedtime. 90 tablet 3   Multiple Vitamin (MULTIVITAMIN) tablet Take 1 tablet by mouth daily.     pantoprazole (PROTONIX) 20 MG tablet Take 1 tablet (20 mg total) by mouth daily. 90 tablet 3   polyethylene glycol powder (GLYCOLAX/MIRALAX) powder Take 17 g by mouth daily as needed. 3350 g 1   Probiotic Product (PROBIOTIC PO) Take 1 capsule by  mouth daily.      No current facility-administered medications for this visit.    Family History  Problem Relation Age of Onset   Heart disease Mother    Other Mother        leg amputation   Arthritis Mother    Osteoporosis Mother    Mental illness Father    Sudden death Father    Arthritis Father    Thyroid disease Father    Stroke Paternal Grandfather     Review of Systems  Exam:   LMP 09/24/2012 (Approximate)     General appearance: alert, cooperative and appears stated age Head: normocephalic, without obvious abnormality, atraumatic Neck: no adenopathy, supple, symmetrical, trachea midline and thyroid normal to inspection and palpation Lungs: clear to auscultation bilaterally Breasts: normal appearance, no masses or tenderness, No nipple retraction or dimpling, No nipple discharge or bleeding,  No axillary adenopathy Heart: regular rate and rhythm Abdomen: soft, non-tender; no masses, no organomegaly Extremities: extremities normal, atraumatic, no cyanosis or edema Skin: skin color, texture, turgor normal. No rashes or lesions Lymph nodes: cervical, supraclavicular, and axillary nodes normal. Neurologic: grossly normal  Pelvic: External genitalia:  no lesions              No abnormal inguinal nodes palpated.              Urethra:  normal appearing urethra with no masses, tenderness or lesions              Bartholins and Skenes: normal                 Vagina: normal appearing vagina with normal color and discharge, no lesions              Cervix: no lesions              Pap taken: {yes no:314532} Bimanual Exam:  Uterus:  normal size, contour, position, consistency, mobility, non-tender              Adnexa: no mass, fullness, tenderness              Rectal exam: {yes no:314532}.  Confirms.              Anus:  normal sphincter tone, no lesions  Chaperone was present for exam:  ***  Assessment:   Well woman visit with gynecologic exam.   Plan: Mammogram screening discussed. Self breast awareness reviewed. Pap and HR HPV as above. Guidelines for Calcium, Vitamin D, regular exercise program including cardiovascular and weight bearing exercise.   Follow up annually and prn.   Additional counseling given.  {yes [id number]. _______ minutes face to face time of which over 50% was spent in counseling.    After visit summary provided.

## 2021-09-28 ENCOUNTER — Ambulatory Visit: Payer: Medicare Other | Admitting: Obstetrics and Gynecology

## 2021-09-28 DIAGNOSIS — Z0289 Encounter for other administrative examinations: Secondary | ICD-10-CM

## 2021-10-03 NOTE — Progress Notes (Signed)
Cardiology Office Note:    Date:  10/05/2021   ID:  Erin West, DOB 28-Nov-1950, MRN [id number]  PCP:  Leamon Arnt, MD [facility] Cardiologist: [name], MD   Reason for visit: Follow-up  History of Present Illness:    Erin West is a 71 y.o. female with a hx of palpitations, coronary artery calcification by CT,  HTN, HLD.  Nuclear study July 2011 with normal LVEF and no ischemic. Event monitor July 2011 with NSR. Echo 05/2018 normal LVEF, mild diastolic dysfunction. Monitor 06/2018 with NSR with PVC's.   She was prescribed diltiazem for palpitations in July 2022 but stopped taking it after feeling drained.  She saw Laurann Montana, NP in September 2022.  Caitlin ordered a ZIO monitor.  Decreased her losartan due to hypotension.  She also referred her to Dr. Elias Else for grief counseling after her husband's passing.    Today, patient states she is doing better coping after losing her husband last year.  She states she saw Dr. Elias Else one time.  She has continued water aerobics 3-4 times a week at [employer].  She denies chest pain, shortness of breath, orthopnea, PND, leg swelling, lightheadedness and syncope.  Her palpitations are brief and rare.  Blood pressure at home 120s to 130s over 70s.  Her losartan dose was recently decreased.  She states she has a chronic cough with her bad sinus drainage.  She is on medication for this.    Past Medical History:  Diagnosis Date   Anxiety    Arthritis    Aspiration pneumonia ([place]) 2013   Atypical chest pain    Stress test 04/21/10 - post-stress EF=89%. Normal scan.   Diarrhea    Diastolic dysfunction    But normal LV function on ECHO 2011 and low risk Myoview in 2011   DVT (deep venous thrombosis) (HCC)    Dyslipidemia    GERD (gastroesophageal reflux disease)    Hypertension    Osteoarthritis    Osteopenia    Palpitations    Bi[employer]atch MCT Monitor 04/16/10-04/22/10    Syncope    Pain-mediated syncope     Past Surgical History:  Procedure Laterality Date   CHOLECYSTECTOMY  2006   KNEE ARTHROSCOPY Right    REPLACEMENT TOTAL KNEE Left    left   REPLACEMENT TOTAL KNEE Right    TUBAL LIGATION  06/1978    Current Medications: Current Meds  Medication Sig   acetaminophen (TYLENOL) 650 MG CR tablet Take 650 mg by mouth every 8 (eight) hours as needed for pain.   albuterol (VENTOLIN HFA) 108 (90 Base) MCG/ACT inhaler Inhale 2 puffs into the lungs every 4 (four) hours as needed for wheezing or shortness of breath.   ALPRAZolam (XANAX) 0.5 MG tablet Take 1 tablet (0.5 mg total) by mouth at bedtime.   atorvastatin (LIPITOR) 20 MG tablet Take 1 tablet (20 mg total) by mouth at bedtime.   carvedilol (COREG) 25 MG tablet Take 1 tablet (25 mg total) by mouth 2 (two) times daily.   cetirizine (ZYRTEC) 10 MG tablet Take 1 tablet (10 mg total) by mouth daily.   Cholecalciferol (VITAMIN D) 2000 UNITS CAPS Take 2,000 Units by mouth daily.    fluticasone-salmeterol (ADVAIR HFA) 115-21 MCG/ACT inhaler Inhale 2 puffs into the lungs 2 (two) times daily.   ipratropium (ATROVENT) 0.03 % nasal spray Place 2 sprays into both nostrils every 12 (twelve) hours.   losartan (COZAAR) 25 MG tablet Take 1 tablet (  25 mg total) by mouth daily.   montelukast (SINGULAIR) 10 MG tablet Take 1 tablet (10 mg total) by mouth at bedtime.   Multiple Vitamin (MULTIVITAMIN) tablet Take 1 tablet by mouth daily.   pantoprazole (PROTONIX) 20 MG tablet Take 1 tablet (20 mg total) by mouth daily.   polyethylene glycol powder (GLYCOLAX/MIRALAX) powder Take 17 g by mouth daily as needed.   Probiotic Product (PROBIOTIC PO) Take 1 capsule by mouth daily.      Allergies:   Penicillins, Sulfa antibiotics, Sulfasalazine, Tramadol, Ciprofloxacin, Trimethoprim, and Isovue [iopamidol]   Social History   Socioeconomic History   Marital status: Widowed    Spouse name: Not on file   Number of children: 3   Years of education: Not on file    Highest education level: Not on file  Occupational History   Not on file  Tobacco Use   Smoking status: Never   Smokeless tobacco: Never  Vaping Use   Vaping Use: Never used  Substance and Sexual Activity   Alcohol use: Yes    Alcohol/week: 0.0 standard drinks    Comment: 2-3/wine per month   Drug use: No   Sexual activity: Never  Other Topics Concern   Not on file  Social History Narrative   Not on file   Social Determinants of Health   Financial Resource Strain: Not on file  Food Insecurity: Not on file  Transportation Needs: Not on file  Physical Activity: Not on file  Stress: Not on file  Social Connections: Not on file     Family History: The patient's family history includes Arthritis in her father and mother; Heart disease in her mother; Mental illness in her father; Osteoporosis in her mother; Other in her mother; Stroke in her paternal grandfather; Sudden death in her father; Thyroid disease in her father.  ROS:   Please see the history of present illness.     EKGs/Labs/Other Studies Reviewed:    EKG:  The ekg ordered today demonstrates normal sinus rhythm, heart rate 62, PR interval 118 ms, QRS duration 70 ms.  Recent Labs: 11/29/2020: ALT 22; TSH 2.56 04/03/2021: BUN 24; Creatinine, Ser 1.10; Hemoglobin 14.0; Platelets 239; Potassium 4.2; Sodium 139   Recent Lipid Panel Lab Results  Component Value Date/Time   CHOL 180 11/29/2020 11:08 AM   TRIG 56.0 11/29/2020 11:08 AM   HDL 71.00 11/29/2020 11:08 AM   LDLCALC 98 11/29/2020 11:08 AM    Physical Exam:    VS:  BP 138/76    Pulse 62    Ht 5\' 3"  (1.6 m)    Wt 197 lb 3.2 oz (89.4 kg)    LMP 09/24/2012 (Approximate)    SpO2 99%    BMI 34.93 kg/m    No data found.  Wt Readings from Last 3 Encounters:  10/05/21 197 lb 3.2 oz (89.4 kg)  09/20/21 199 lb (90.3 kg)  05/31/21 200 lb (90.7 kg)     GEN:  Well nourished, well developed in no acute distress HEENT: Normal NECK: No JVD; No carotid  bruits CARDIAC: RRR, no murmurs, rubs, gallops RESPIRATORY:  Clear to auscultation without rales, wheezing or rhonchi  ABDOMEN: Soft, non-tender, non-distended MUSCULOSKELETAL: No edema; No deformity  SKIN: Warm and dry NEUROLOGIC:  Alert and oriented PSYCHIATRIC:  Normal affect     ASSESSMENT AND PLAN   Palpitations, rare & brief -7 day Zio patch 05/2021: NSR, avg HR 75, 2 short runs of NSVT, <1% PACs/PVCs -Did not tolerate diltiazem secondary  to fatigue -Continue Coreg 25 mg twice daily -Gave her lifestyle tip handouts including suggestions for stress management.  Hypertension, well controlled -Continue current medications.  I do not think her cough is related to losartan but instead her allergies.  If she ever wanted to switch medications in the future, we could consider amlodipine.   -Recommend DASH diet (high in vegetables, fruits, low-fat dairy products, whole grains, poultry, fish, and nuts and low in sweets, sugar-sweetened beverages, and red meats), salt restriction and increase physical activity.  Hyperlipidemia, well controlled -Goal LDL less than 100.  LDL in March 2022 was 98.  Continue Lipitor.  Lipids will be rechecked this March. -Recommend Mediterranean diet, DASH diet, vegetarian diet, low-carbohydrate diet and avoidance of trans fats.  Discussed healthier choice substitutes.  Nuts, high-fiber foods, and fiber supplements may also improve lipids.    Disposition - Follow-up in 1 year Dr. Stanford Breed.        Medication Adjustments/Labs and Tests Ordered: Current medicines are reviewed at length with the patient today.  Concerns regarding medicines are outlined above.  Orders Placed This Encounter  Procedures   EKG 12-Lead   No orders of the defined types were placed in this encounter.   Patient Instructions  Medication Instructions:  No Changes *If you need a refill on your cardiac medications before your next appointment, please call your pharmacy*   Lab  Work: No Labs If you have labs (blood work) drawn today and your tests are completely normal, you will receive your results only by: [place] (if you have MyChart) OR A paper copy in the mail If you have any lab test that is abnormal or we need to change your treatment, we will call you to review the results.   Testing/Procedures: No Testing   Follow-Up: At [facility], you and your health needs are our priority.  As part of our continuing mission to provide you with exceptional heart care, we have created designated Provider Care Teams.  These Care Teams include your primary Cardiologist (physician) and Advanced Practice Providers (APPs -  Physician Assistants and Nurse Practitioners) who all work together to provide you with the care you need, when you need it.  We recommend signing up for the patient portal called "MyChart".  Sign up information is provided on this After Visit Summary.  MyChart is used to connect with patients for Virtual Visits (Telemedicine).  Patients are able to view lab/test results, encounter notes, upcoming appointments, etc.  Non-urgent messages can be sent to your provider as well.   To learn more about what you can do with MyChart, go to [email or web address].    Your next appointment:   1 year(s)  The format for your next appointment:   In Person  Provider:   [name], MD         Signed, Warren Lacy, PA-C  10/05/2021 10:06 AM    [place]

## 2021-10-05 ENCOUNTER — Ambulatory Visit
Admission: RE | Admit: 2021-10-05 | Discharge: 2021-10-05 | Disposition: A | Discharge status: Home / Self Care | Payer: Medicare Other | Source: Ambulatory Visit | Attending: Pulmonary Disease | Admitting: Pulmonary Disease

## 2021-10-05 ENCOUNTER — Other Ambulatory Visit: Payer: Self-pay

## 2021-10-05 ENCOUNTER — Encounter: Payer: Self-pay | Admitting: Physician Assistant

## 2021-10-05 ENCOUNTER — Ambulatory Visit (INDEPENDENT_AMBULATORY_CARE_PROVIDER_SITE_OTHER): Payer: Medicare Other | Admitting: Physician Assistant

## 2021-10-05 VITALS — BP 138/76 | HR 62 | Ht 63.0 in | Wt 197.2 lb

## 2021-10-05 DIAGNOSIS — R911 Solitary pulmonary nodule: Secondary | ICD-10-CM | POA: Diagnosis not present

## 2021-10-05 DIAGNOSIS — E782 Mixed hyperlipidemia: Secondary | ICD-10-CM

## 2021-10-05 DIAGNOSIS — R002 Palpitations: Secondary | ICD-10-CM | POA: Diagnosis not present

## 2021-10-05 DIAGNOSIS — I10 Essential (primary) hypertension: Secondary | ICD-10-CM | POA: Diagnosis not present

## 2021-10-05 NOTE — Patient Instructions (Signed)
Medication Instructions:  No Changes *If you need a refill on your cardiac medications before your next appointment, please call your pharmacy*   Lab Work: No Labs If you have labs (blood work) drawn today and your tests are completely normal, you will receive your results only by: . MyChart Message (if you have MyChart) OR . A paper copy in the mail If you have any lab test that is abnormal or we need to change your treatment, we will call you to review the results.   Testing/Procedures: No Testing   Follow-Up: At [facility], you and your health needs are our priority.  As part of our continuing mission to provide you with exceptional heart care, we have created designated Provider Care Teams.  These Care Teams include your primary Cardiologist (physician) and Advanced Practice Providers (APPs -  Physician Assistants and Nurse Practitioners) who all work together to provide you with the care you need, when you need it.  We recommend signing up for the patient portal called "MyChart".  Sign up information is provided on this After Visit Summary.  MyChart is used to connect with patients for Virtual Visits (Telemedicine).  Patients are able to view lab/test results, encounter notes, upcoming appointments, etc.  Non-urgent messages can be sent to your provider as well.   To learn more about what you can do with MyChart, go to [email or web address].    Your next appointment:   1 year(s)  The format for your next appointment:   In Person  Provider:   Brian Crenshaw, MD   

## 2021-10-06 DIAGNOSIS — R350 Frequency of micturition: Secondary | ICD-10-CM | POA: Diagnosis not present

## 2021-10-06 DIAGNOSIS — N3941 Urge incontinence: Secondary | ICD-10-CM | POA: Diagnosis not present

## 2021-10-10 NOTE — Progress Notes (Signed)
Please let patient know her CT scan is reassuring as the nodules in the left lung have resolved and the other tiny nodules have remained the same size of the past couple of years. No need for further follow up of the nodules with scans in the future.   Thanks, JD

## 2021-10-12 DIAGNOSIS — S93492A Sprain of other ligament of left ankle, initial encounter: Secondary | ICD-10-CM | POA: Diagnosis not present

## 2021-10-23 ENCOUNTER — Other Ambulatory Visit: Payer: Self-pay

## 2021-10-23 ENCOUNTER — Ambulatory Visit (INDEPENDENT_AMBULATORY_CARE_PROVIDER_SITE_OTHER): Payer: Medicare Other | Admitting: Podiatry

## 2021-10-23 ENCOUNTER — Encounter: Payer: Self-pay | Admitting: Podiatry

## 2021-10-23 DIAGNOSIS — B351 Tinea unguium: Secondary | ICD-10-CM | POA: Diagnosis not present

## 2021-10-23 MED ORDER — CICLOPIROX 8 % EX SOLN: Freq: Every day | CUTANEOUS | 0 refills | Status: DC

## 2021-10-23 NOTE — Progress Notes (Signed)
°  Subjective:  Patient ID: Erin West, female    DOB: November 03, 1950,   MRN: [id number]  Chief Complaint  Patient presents with   Nail Problem    Pt is here for toenails. Reports that she doesn't like that they are growing sideways    71 y.o. female presents for concern of thickened and discolored toenails. Relates that they grow to the side and can be painful. Relates she has had her great toenail on the left removed in the past. Also relates tingling in the toes.  . Denies any other pedal complaints. Denies n/v/f/c.   Past Medical History:  Diagnosis Date   Anxiety    Arthritis    Aspiration pneumonia ([place]) 2013   Atypical chest pain    Stress test 04/21/10 - post-stress EF=89%. Normal scan.   Diarrhea    Diastolic dysfunction    But normal LV function on ECHO 2011 and low risk Myoview in 2011   DVT (deep venous thrombosis) (HCC)    Dyslipidemia    GERD (gastroesophageal reflux disease)    Hypertension    Osteoarthritis    Osteopenia    Palpitations    Biowatch MCT Monitor 04/16/10-04/22/10    Syncope    Pain-mediated syncope    Objective:  Physical Exam: Vascular: DP/PT pulses 2/4 bilateral. CFT <3 seconds. Normal hair growth on digits. No edema.  Skin. No lacerations or abrasions bilateral feet. Nails 2-5 are thickened discolored and elongated with subungual debris.  Musculoskeletal: MMT 5/5 bilateral lower extremities in DF, PF, Inversion and Eversion. Deceased ROM in DF of ankle joint.  Neurological: Sensation intact to light touch.   Assessment:   1. Onychomycosis      Plan:  Patient was evaluated and treated and all questions answered. -Examined patient -Discussed treatment options for painful dystrophic nails  -Discussed fungal nail treatment options including oral, topical, and laser treatments.  -Penlac sent to pharmacy -Nails 1-5 debrided as courtesy.  -Patient to return as needed.    Lorenda Peck, DPM

## 2021-10-24 ENCOUNTER — Ambulatory Visit (INDEPENDENT_AMBULATORY_CARE_PROVIDER_SITE_OTHER): Payer: Medicare Other | Admitting: Physician Assistant

## 2021-10-24 ENCOUNTER — Encounter: Payer: Self-pay | Admitting: Physician Assistant

## 2021-10-24 VITALS — BP 117/80 | HR 89 | Temp 99.7°F | Ht 63.0 in

## 2021-10-24 DIAGNOSIS — U071 COVID-19: Secondary | ICD-10-CM

## 2021-10-24 LAB — POCT INFLUENZA A/B
Influenza A, POC: NEGATIVE
Influenza B, POC: NEGATIVE

## 2021-10-24 LAB — POC COVID19 BINAXNOW: SARS Coronavirus 2 Ag: POSITIVE — AB

## 2021-10-24 MED ORDER — MOLNUPIRAVIR EUA 200MG CAPSULE: 4.0000 | ORAL_CAPSULE | Freq: Two times a day (BID) | ORAL | 0 refills | Status: AC

## 2021-10-24 NOTE — Progress Notes (Signed)
° °  Subjective:    Patient ID: Erin West, female    DOB: 08-11-1951, 71 y.o.   MRN: [id number]  Chief Complaint  Patient presents with   URI    Cough,fever,chills,headache,congestion x 1 day    Virtual Visit via Telephone Note  I connected with Erin West on 10/24/21 at  1:30 PM EST by telephone and verified that I am speaking with the correct person using two identifiers.  Location: Patient: home Provider: Therapist, music at [employer]   I discussed the limitations, risks, security and privacy concerns of performing an evaluation and management service by telephone and the availability of in person appointments. I also discussed with the patient that there may be a patient responsible charge related to this service. The patient expressed understanding and agreed to proceed.   History of Present Illness:  Chief complaint: Cough Symptom onset: 10/23/21 Pertinent positives: Runny nose, headache, chills, nasal congestion, low fever (100.5 F last night) Pertinent negatives: SOB, CP, N/V/D, abd pain Treatments tried: Tylenol Vaccine status: UTD with COVID-19 boosters  Sick exposure: No known direct sick contacts    Observations/Objective:  Today's Vitals   10/24/21 1336  BP: 117/80  Pulse: 89  Temp: 99.7 F (37.6 C)  SpO2: 94%  Height: 5\' 3"  (1.6 m)   Body mass index is 34.93 kg/m.  Speaking clearly in full sentences without pauses or gasps. Harsh intermittent cough.    Assessment and Plan:  1. COVID-19 Patient initially scheduled as an office-visit. CMA was able to see patient first for POC influenza (negative) and COVID-19 testing (immediately positive). Vitals were obtained by CMA and patient was asked to quarantine at home and have provider call for phone visit.   We discussed current algorithm recommendations for prescribing outpatient antivirals.  As the patient is over age 51 and within 5 day window of symptom onset, she could benefit from  anti-virals. Risks versus benefits discussed. Agreed on Molnupiravir treatment. Possible SE discussed.  Advised self-isolation at home for the next 5 days and then masking around others for at least an additional 5 days.  Treat supportively as well at this time including sleeping prone, deep breathing exercises, pushing fluids, walking West few hours, vitamins C and D, and Tylenol or ibuprofen as needed.  The patient understands that COVID-19 illness can wax and wane.  Should the symptoms acutely worsen or patient starts to experience sudden shortness of breath, chest pain, severe weakness, the patient will go straight to the emergency department.  Also advised home pulse oximetry monitoring and for any reading consistently under 92%, should also report to the emergency department.  The patient will continue to keep [place] updated.   Follow Up Instructions:    I discussed the assessment and treatment plan with the patient. The patient was provided an opportunity to ask questions and all were answered. The patient agreed with the plan and demonstrated an understanding of the instructions.   The patient was advised to call back or seek an in-person evaluation if the symptoms worsen or if the condition fails to improve as anticipated.  I provided 6 minutes of non-face-to-face time during this encounter.   Dartagnan Beavers M Suleika Donavan, PA-C

## 2021-10-30 ENCOUNTER — Other Ambulatory Visit: Payer: Self-pay

## 2021-10-30 ENCOUNTER — Encounter: Payer: Self-pay | Admitting: Physician Assistant

## 2021-10-30 ENCOUNTER — Ambulatory Visit: Payer: Medicare Other | Admitting: Family Medicine

## 2021-10-30 ENCOUNTER — Ambulatory Visit (INDEPENDENT_AMBULATORY_CARE_PROVIDER_SITE_OTHER): Payer: Medicare Other | Admitting: Physician Assistant

## 2021-10-30 VITALS — BP 111/77 | HR 69 | Temp 98.1°F | Ht 63.0 in | Wt 193.6 lb

## 2021-10-30 DIAGNOSIS — U071 COVID-19: Secondary | ICD-10-CM

## 2021-10-30 DIAGNOSIS — R002 Palpitations: Secondary | ICD-10-CM

## 2021-10-30 NOTE — Progress Notes (Signed)
Subjective:    Patient ID: Erin West, female    DOB: 1951/04/02, 71 y.o.   MRN: [id number]  Chief Complaint  Patient presents with   Fatigue    Elevaged HR  Unable to eat     HPI Patient is in today for COVID-19 symptoms. Started on 10/23/21. Tested positive in office on 10/25/19.   Took Molnupiravir without much relief.   She started having palpitations 3-4 days ago. They come and go. Sometimes has trouble catching her breath. Headache off and on.  Just saw her cardiologist on 10/05/21 & that appt went well.   180/102 was blood pressure at home yesterday. She felt dizzy at that time.  Urinating well still. Staying hydrated with water. Not very hungry.   No current CP, SOB, dizziness, or palpitations during appointment.   Past Medical History:  Diagnosis Date   Anxiety    Arthritis    Aspiration pneumonia ([place]) 2013   Atypical chest pain    Stress test 04/21/10 - post-stress EF=89%. Normal scan.   Diarrhea    Diastolic dysfunction    But normal LV function on ECHO 2011 and low risk Myoview in 2011   DVT (deep venous thrombosis) (HCC)    Dyslipidemia    GERD (gastroesophageal reflux disease)    Hypertension    Osteoarthritis    Osteopenia    Palpitations    Biowatch MCT Monitor 04/16/10-04/22/10    Syncope    Pain-mediated syncope    Past Surgical History:  Procedure Laterality Date   CHOLECYSTECTOMY  2006   KNEE ARTHROSCOPY Right    REPLACEMENT TOTAL KNEE Left    left   REPLACEMENT TOTAL KNEE Right    TUBAL LIGATION  06/1978    Family History  Problem Relation Age of Onset   Heart disease Mother    Other Mother        leg amputation   Arthritis Mother    Osteoporosis Mother    Mental illness Father    Sudden death Father    Arthritis Father    Thyroid disease Father    Stroke Paternal Grandfather     Social History   Tobacco Use   Smoking status: Never   Smokeless tobacco: Never  Vaping Use   Vaping Use: Never used  Substance Use Topics    Alcohol use: Yes    Alcohol/week: 0.0 standard drinks    Comment: 2-3/wine per month   Drug use: No     Allergies  Allergen Reactions   Penicillins Swelling and Rash    Has patient had a PCN reaction causing immediate rash, facial/tongue/throat swelling, SOB or lightheadedness with hypotension: No Has patient had a PCN reaction causing severe rash involving mucus membranes or skin necrosis: No Has patient had a PCN reaction that required hospitalization unknown Has patient had a PCN reaction occurring within the last 10 years: No If all of the above answers are "NO", then may proceed with Cephalosporin use.    Sulfa Antibiotics Swelling and Rash   Sulfasalazine Rash and Swelling   Tramadol Other (See Comments)    Rapid heart rate   Ciprofloxacin     Muscular pain   Trimethoprim     rash   Isovue [Iopamidol] Hives    Pt had sneezing, itchy throat, a couple of hives and swollen, itchy left eye. Pt was given 50 mg po benadryl, and water.  Dr Carlis Abbott checked pt.  We observed pt for 30 mins w/ 5 minute BP  checks.  Pt left w/o complication.  Pt will need full premeds in the future.  Alfonse Alpers, RTRCT    Review of Systems NEGATIVE UNLESS OTHERWISE INDICATED IN HPI      Objective:     BP 111/77    Pulse 69    Temp 98.1 F (36.7 C) (Temporal)    Ht 5\' 3"  (1.6 m)    Wt 193 lb 9.6 oz (87.8 kg)    LMP 09/24/2012 (Approximate)    SpO2 97%    BMI 34.29 kg/m   Wt Readings from Last 3 Encounters:  10/30/21 193 lb 9.6 oz (87.8 kg)  10/05/21 197 lb 3.2 oz (89.4 kg)  09/20/21 199 lb (90.3 kg)    BP Readings from Last 3 Encounters:  10/30/21 111/77  10/24/21 117/80  10/05/21 138/76     Physical Exam Vitals and nursing note reviewed.  Constitutional:      Appearance: Normal appearance. She is normal weight. She is not toxic-appearing.  HENT:     Head: Normocephalic and atraumatic.     Right Ear: Tympanic membrane, ear canal and external ear normal.     Left Ear: Tympanic membrane, ear  canal and external ear normal.     Nose: Nose normal.     Mouth/Throat:     Mouth: Mucous membranes are moist.  Eyes:     Extraocular Movements: Extraocular movements intact.     Conjunctiva/sclera: Conjunctivae normal.     Pupils: Pupils are equal, round, and reactive to light.  Cardiovascular:     Rate and Rhythm: Normal rate and regular rhythm.     Pulses: Normal pulses.     Heart sounds: Normal heart sounds.  Pulmonary:     Effort: Pulmonary effort is normal.     Breath sounds: Normal breath sounds.  Abdominal:     General: Abdomen is flat. Bowel sounds are normal.     Palpations: Abdomen is soft.  Musculoskeletal:        General: Normal range of motion.     Cervical back: Normal range of motion and neck supple.  Skin:    General: Skin is warm and dry.  Neurological:     General: No focal deficit present.     Mental Status: She is alert and oriented to person, place, and time.  Psychiatric:        Mood and Affect: Mood normal.        Behavior: Behavior normal.        Thought Content: Thought content normal.        Judgment: Judgment normal.       Assessment & Plan:   Problem List Items Addressed This Visit   None Visit Diagnoses     COVID-19    -  Primary   Palpitation           1. COVID-19 2. Palpitation -Pt is one week out from onset of COVID-19 symptom onset -No red flags on exam -Reassured patient that her vital signs are reassuring, she is overall well-appearing, normal for COVID-19 symptoms to linger -I personally reviewed cardiology report from 10/05/21, this was reassuring. Asymptomatic at this time, did not feel it necessary to repeat EKG in office. -Pt will continue to REST, push fluids, Vit C / D supplement, and update [place] / recheck as necessary.   This note was prepared with assistance of Systems analyst. Occasional wrong-word or sound-a-like substitutions may have occurred due to the inherent limitations of voice recognition  software.  Time Spent: 22 minutes of total time was spent on the date of the encounter performing the following actions: chart review prior to seeing the patient, obtaining history, performing a medically necessary exam, counseling on the treatment plan, placing orders, and documenting in our EHR.    Robynn Marcel M Nhyla Nappi, PA-C

## 2021-11-01 NOTE — Progress Notes (Signed)
70 y.o. G35P3 Widowed Caucasian female here for annual exam.    Has some bilateral abdominal pain that comes and goes.  No vaginal bleeding.  Normal pelvic US March, 2021.  Has constipation off an on.  Miralax helps sometimes.   Still taking Vesicare from Dr. Mikle Bosworth office.   Taking calcium once a day.  Does deal with vaginal dryness.  Had Covid and is feeling fatigue.  She was vaccinated.   PCP:   Billey Chang, MD  Patient's last menstrual period was 09/24/2012 (approximate).           Sexually active: No.  The current method of family planning is post menopausal status.    Exercising: Yes.    Gym/health club. Smoker:  no  Health Maintenance: Pap:  ? yrs History of abnormal Pap:  yes-[place] in her 20's-normal since MMG:  12-14-20 normal Colonoscopy:  2017 normal.  Scheduled for next month. BMD:   12-14-20  Result  Low bone mass TDaP:  2017 Gardasil:   no Screening Labs:  Hb today: PCP, Urine today: PCP Flu vaccine:  completed.  Covid vaccines:  Bivalent completed.   reports that she has never smoked. She has never used smokeless tobacco. She reports current alcohol use. She reports that she does not use drugs.  Past Medical History:  Diagnosis Date   Anxiety    Arthritis    Aspiration pneumonia ([place]) 2013   Atypical chest pain    Stress test 04/21/10 - post-stress EF=89%. Normal scan.   Diarrhea    Diastolic dysfunction    But normal LV function on ECHO 2011 and low risk Myoview in 2011   DVT (deep venous thrombosis) (HCC)    Dyslipidemia    GERD (gastroesophageal reflux disease)    Hypertension    Osteoarthritis    Osteopenia    Palpitations    Biowatch MCT Monitor 04/16/10-04/22/10    Syncope    Pain-mediated syncope    Past Surgical History:  Procedure Laterality Date   CHOLECYSTECTOMY  2006   KNEE ARTHROSCOPY Right    REPLACEMENT TOTAL KNEE Left    left   REPLACEMENT TOTAL KNEE Right    TUBAL LIGATION  06/1978    Current Outpatient  Medications  Medication Sig Dispense Refill   acetaminophen (TYLENOL) 650 MG CR tablet Take 650 mg by mouth every 8 (eight) hours as needed for pain.     albuterol (VENTOLIN HFA) 108 (90 Base) MCG/ACT inhaler Inhale 2 puffs into the lungs every 4 (four) hours as needed for wheezing or shortness of breath. 1 each 2   ALPRAZolam (XANAX) 0.5 MG tablet Take 1 tablet (0.5 mg total) by mouth at bedtime. 90 tablet 1   atorvastatin (LIPITOR) 20 MG tablet Take 1 tablet (20 mg total) by mouth at bedtime. 90 tablet 3   carvedilol (COREG) 25 MG tablet Take 1 tablet (25 mg total) by mouth 2 (two) times daily. 180 tablet 1   cetirizine (ZYRTEC) 10 MG tablet Take 1 tablet (10 mg total) by mouth daily. 90 tablet 3   Cholecalciferol (VITAMIN D) 2000 UNITS CAPS Take 2,000 Units by mouth daily.      ciclopirox (PENLAC) 8 % solution Apply topically at bedtime. Apply over nail and surrounding skin. Apply daily over previous coat. After seven (7) days, may remove with alcohol and continue cycle. 6.6 mL 0   fluticasone-salmeterol (ADVAIR HFA) 115-21 MCG/ACT inhaler Inhale 2 puffs into the lungs 2 (two) times daily. 36 g 3   ipratropium (  ATROVENT) 0.03 % nasal spray Place 2 sprays into both nostrils every 12 (twelve) hours. 90 mL 3   losartan (COZAAR) 25 MG tablet Take 1 tablet (25 mg total) by mouth daily. 90 tablet 3   montelukast (SINGULAIR) 10 MG tablet Take 1 tablet (10 mg total) by mouth at bedtime. 90 tablet 3   Multiple Vitamin (MULTIVITAMIN) tablet Take 1 tablet by mouth daily.     pantoprazole (PROTONIX) 40 MG tablet Take 40 mg by mouth daily.     polyethylene glycol powder (GLYCOLAX/MIRALAX) powder Take 17 g by mouth daily as needed. 3350 g 1   Probiotic Product (PROBIOTIC PO) Take 1 capsule by mouth daily.      solifenacin (VESICARE) 5 MG tablet Take 5 mg by mouth daily.     No current facility-administered medications for this visit.    Family History  Problem Relation Age of Onset   Heart disease  Mother    Other Mother        leg amputation   Arthritis Mother    Osteoporosis Mother    Mental illness Father    Sudden death Father    Arthritis Father    Thyroid disease Father    Stroke Paternal Grandfather     Review of Systems  All other systems reviewed and are negative.  Exam:   BP 124/84 (BP Location: Left Arm, Patient Position: Sitting, Cuff Size: Normal)    Pulse 75    Ht 5\' 3"  (1.6 m)    Wt 191 lb (86.6 kg)    LMP 09/24/2012 (Approximate)    SpO2 98%    BMI 33.83 kg/m     General appearance: alert, cooperative and appears stated age Head: normocephalic, without obvious abnormality, atraumatic Neck: no adenopathy, supple, symmetrical, trachea midline and thyroid normal to inspection and palpation Lungs: clear to auscultation bilaterally Breasts: normal appearance, no masses or tenderness, No nipple retraction or dimpling, No nipple discharge or bleeding, No axillary adenopathy Heart: regular rate and rhythm Abdomen: soft, non-tender; no masses, no organomegaly Extremities: extremities normal, atraumatic, no cyanosis or edema Skin: skin color, texture, turgor normal. No rashes or lesions Lymph nodes: cervical, supraclavicular, and axillary nodes normal. Neurologic: grossly normal  Pelvic: External genitalia:  no lesions              No abnormal inguinal nodes palpated.              Urethra:  normal appearing urethra with no masses, tenderness or lesions              Bartholins and Skenes: normal                 Vagina: normal appearing vagina with normal color and discharge, no lesions              Cervix: no lesions              Pap taken: yes Bimanual Exam:  Uterus:  normal size, contour, position, consistency, mobility, non-tender              Adnexa: no mass, fullness, tenderness              Rectal exam: yes.  Confirms.              Anus:  normal sphincter tone, no lesions  Chaperone was present for exam:  yes.  Assessment:   Well woman visit with  gynecologic exam. Cervical cancer screening.  Constipation. Overactive bladder.  On  Vesicare.  Plan: Mammogram screening discussed. Self breast awareness reviewed. Pap and HR HPV as above. Guidelines for Calcium, Vitamin D, regular exercise program including cardiovascular and weight bearing exercise. We discussed Miralax and/or Colace along with increased fiber and water in her diet in order to treat constipation.  Follow up in 2 years for routine breast/pelvic/pap and FU in between as needed.  After visit summary provided.   22 min  total time was spent for this patient encounter, including preparation, face-to-face counseling with the patient, coordination of care, and documentation of the encounter.

## 2021-11-03 DIAGNOSIS — Z20822 Contact with and (suspected) exposure to covid-19: Secondary | ICD-10-CM | POA: Diagnosis not present

## 2021-11-07 ENCOUNTER — Telehealth: Payer: Self-pay | Admitting: Cardiology

## 2021-11-07 NOTE — Telephone Encounter (Signed)
Spoke with patient who reports having COVID DX on 10/24/21 and is having increased sob and palpitations. She wants an appointment with Dr. Stanford Breed, but the earliest I have is with Westley Foots in March. Appointment made. Patient also wanted to know the results of her CT scan...given the following.  Freddi Starr, MD  10/10/2021  6:56 AM EST     Please let patient know her CT scan is reassuring as the nodules in the left lung have resolved and the other tiny nodules have remained the same size of the past couple of years. No need for further follow up of the nodules with scans in the future.    Thanks, JD

## 2021-11-07 NOTE — Telephone Encounter (Signed)
Patient c/o Palpitations:  High priority if patient c/o lightheadedness, shortness of breath, or chest pain  How long have you had palpitations/irregular HR/ Afib? Since having COVID  Are you having the symptoms now? no  Are you currently experiencing lightheadedness, SOB or CP? Nauseated   Do you have a history of afib (atrial fibrillation) or irregular heart rhythm? yes  Have you checked your BP or HR? (document readings if available):    Fluctuates from 80/55 to 137/77  Are you experiencing any other symptoms? Fatigue, difficulty taking deep breath  Patient is recovering from [place]. She said she only had occasional palpitations before having COVID, but now since having COVID she has had them more often. She would like to see Dr. Stanford Breed

## 2021-11-09 ENCOUNTER — Telehealth: Payer: Self-pay | Admitting: Family Medicine

## 2021-11-09 NOTE — Telephone Encounter (Signed)
Patient called back and informed her that Dr. Martinique agreed to our plan. Patient stated her PCP told her to go to urgent care. Patient stated she is going to go to urgent care tonight to get evaluated.

## 2021-11-09 NOTE — Telephone Encounter (Signed)
Pt c/o BP issue: STAT if pt c/o blurred vision, one-sided weakness or slurred speech  1. What are your last 5 BP readings?  99/66  2. Are you having any other symptoms (ex. Dizziness, headache, blurred vision, passed out)?  No   3. What is your BP issue?   Patient is following up. She states her BP has been low today and she would like to discuss this as well as the CT.

## 2021-11-09 NOTE — Telephone Encounter (Signed)
Left message for patient to call back  

## 2021-11-09 NOTE — Telephone Encounter (Signed)
Called patient back. Patient had Covid recently, shortly after her last office visit with Caron Presume PA in January. Patient complaining of low BP, palpitations, having a hard time taking a deep breath, feeling weak and tired. Encouraged patient to call her PCP that this might be her body healing from Covid. Encouraged patient to take her BP before she takes her losartan and if SBP is lower than 110 to hold her losartan. Informed patient that a message would be sent to DOD, Dr. Martinique for further advisement, since her cardiologist is out of the office today.

## 2021-11-09 NOTE — Telephone Encounter (Signed)
Pt has been triaged- pt is SOB,Lowe BP, coughing- post covid - awaiting triage notes.

## 2021-11-10 ENCOUNTER — Ambulatory Visit (INDEPENDENT_AMBULATORY_CARE_PROVIDER_SITE_OTHER): Payer: Medicare Other | Admitting: Family Medicine

## 2021-11-10 ENCOUNTER — Other Ambulatory Visit (HOSPITAL_COMMUNITY)
Admission: RE | Admit: 2021-11-10 | Discharge: 2021-11-10 | Disposition: A | Discharge status: Home / Self Care | Payer: Medicare Other | Source: Ambulatory Visit | Attending: Obstetrics and Gynecology | Admitting: Obstetrics and Gynecology

## 2021-11-10 ENCOUNTER — Other Ambulatory Visit: Payer: Self-pay

## 2021-11-10 ENCOUNTER — Encounter: Payer: Self-pay | Admitting: Obstetrics and Gynecology

## 2021-11-10 ENCOUNTER — Ambulatory Visit (INDEPENDENT_AMBULATORY_CARE_PROVIDER_SITE_OTHER): Payer: Medicare Other | Admitting: Obstetrics and Gynecology

## 2021-11-10 VITALS — BP 118/80 | HR 75 | Temp 98.2°F | Ht 63.0 in | Wt 193.2 lb

## 2021-11-10 VITALS — BP 124/84 | HR 75 | Ht 63.0 in | Wt 191.0 lb

## 2021-11-10 DIAGNOSIS — Z01419 Encounter for gynecological examination (general) (routine) without abnormal findings: Secondary | ICD-10-CM | POA: Diagnosis not present

## 2021-11-10 DIAGNOSIS — R002 Palpitations: Secondary | ICD-10-CM

## 2021-11-10 DIAGNOSIS — K5900 Constipation, unspecified: Secondary | ICD-10-CM | POA: Diagnosis not present

## 2021-11-10 DIAGNOSIS — Z1151 Encounter for screening for human papillomavirus (HPV): Secondary | ICD-10-CM | POA: Diagnosis not present

## 2021-11-10 DIAGNOSIS — Z124 Encounter for screening for malignant neoplasm of cervix: Secondary | ICD-10-CM | POA: Diagnosis not present

## 2021-11-10 DIAGNOSIS — I10 Essential (primary) hypertension: Secondary | ICD-10-CM | POA: Diagnosis not present

## 2021-11-10 MED ORDER — BENZONATATE 200 MG PO CAPS: 200.0000 mg | ORAL_CAPSULE | Freq: Two times a day (BID) | ORAL | 0 refills | Status: DC | PRN

## 2021-11-10 MED ORDER — PREDNISONE 50 MG PO TABS: ORAL_TABLET | ORAL | 0 refills | Status: DC

## 2021-11-10 NOTE — Progress Notes (Signed)
° °  Erin West is a 71 y.o. female who presents today for an office visit.  Assessment/Plan:  New/Acute Problems: Cough Likely postviral cough.  We discussed natural course of illness and that her cough may persist for another several weeks.  She does have underlying history of reactive airway disease which is likely making this more pronounced.  Her lung exam today is clear without any signs of reactive airway disease flare, bronchitis, or pneumonia.  She does have a little bit of postnasal drip which is likely contributing.  She will continue taking her Astelin.  We will start Tessalon to help with her symptoms.  We discussed prednisone however she would like to defer for now.  She will let us know if symptoms do not continue to improve.  Chronic Problems Addressed Today: Hypertension - At goal today on losartan 25 mg daily, Coreg 25 mg twice daily.  She will continue to monitor at home and let us know if she has any persistent lows.   Reactive Airway Disease -she follows with pulmonology.  This is likely contributing to her cough.  She will continue taking her Advair and albuterol as needed.  We discussed adding on prednisone as above however she would like to defer for now.  Would recommend referral back to pulmonology if continues to have persistent cough beyond expected length of time.    Subjective:  HPI:  Patient here with cough, palpitations, shortness of breath.  She was diagnosed with COVID few weeks ago.  Symptoms were relatively mild.  She was given a course of molnupiravir.  Since then she has had continued issues with cough, shortness of breath, and low energy.  She is also concerned about low blood pressure readings.  Home readings have been as low as the 90s over 60s.  She becomes more winded with exertion.  Cough is nonproductive.  No fevers.  She has had some issues with worsening palpitations though this is a pre-existing problem.  She has been compliant with her medications.   No missed doses.  No specific treatments tried.         Objective:  Physical Exam: BP 118/80 (BP Location: Right Arm)    Pulse 75    Temp 98.2 F (36.8 C) (Temporal)    Ht 5\' 3"  (1.6 m)    Wt 193 lb 3.2 oz (87.6 kg)    LMP 09/24/2012 (Approximate)    SpO2 98%    BMI 34.22 kg/m   Gen: No acute distress, resting comfortably HEENT: TMs with clear effusion.  OP clear.  Nasal mucosa erythematous and boggy with clear discharge. CV: Regular rate and rhythm with no murmurs appreciated Pulm: Normal work of breathing, clear to auscultation bilaterally with no crackles, wheezes, or rhonchi Neuro: Grossly normal, moves all extremities Psych: Normal affect and thought content  EKG: Artifact in lead V5.  Normal sinus rhythm.  No ischemic changes.      Algis Greenhouse. Jerline Pain, MD 11/10/2021 4:27 PM

## 2021-11-10 NOTE — Patient Instructions (Signed)

## 2021-11-10 NOTE — Patient Instructions (Signed)
It was very nice to see you today!  Please start the cough medication.  Take the prednisone if not improving.  Let us know if your symptoms do not continue to improve.  Take care, Dr Jerline Pain  PLEASE NOTE:  If you had any lab tests please let us know if you have not heard back within a few days. You may see your results on mychart before we have a chance to review them but we will give you a call once they are reviewed by [place]. If we ordered any referrals today, please let us know if you have not heard from their office within the next week.   Please try these tips to maintain a healthy lifestyle:  Eat at least 3 REAL meals and 1-2 snacks per day.  Aim for no more than 5 hours between eating.  If you eat breakfast, please do so within one hour of getting up.   Each meal should contain half fruits/vegetables, one quarter protein, and one quarter carbs (no bigger than a computer mouse)  Cut down on sweet beverages. This includes juice, soda, and sweet tea.   Drink at least 1 glass of water with each meal and aim for at least 8 glasses per day  Exercise at least 150 minutes every week.

## 2021-11-10 NOTE — Telephone Encounter (Signed)
Pt called cards: told to hold losartan.  Erin West was going to urgent care.   Please call to check on her.  If needed, should offer her appointment here.

## 2021-11-10 NOTE — Telephone Encounter (Signed)
Will call pt to schedule an appt  Patient Name: Erin West Gender: Female DOB: 03-05-1951 Age: 70 Y 10 M 8 D Return Phone Number: [phone] (Primary) Address: City/ State/ Zip: [place]  [address] Client [facility] Provider Billey Chang- MD Contact Type Call Who Is Calling Patient / Member / Family / Caregiver Call Type Triage / Clinical Relationship To Patient Self Return Phone Number [phone] (Primary) Chief Complaint BREATHING - shortness of breath or sounds breathless Reason for Call Symptomatic / Request for Health Information Initial Comment Patient is post covid. She is experiencing low BP, cough, and shortness of breath. Last BP taken 97/66. [facility]: [address] Translation No Nurse Assessment Nurse: Durene Cal, RN, Brandi Date/Time (Eastern Time): 11/09/2021 4:12:49 PM Confirm and document reason for call. If symptomatic, describe symptoms. ---Patient is post covid. She is experiencing low BP, cough, and shortness of breath. Last BP taken 97/66. COvid positive 1/31. Does the patient have any new or worsening symptoms? ---Yes Will a triage be completed? ---Yes Related visit to physician within the last 2 weeks? ---No Does the PT have any chronic conditions? (i.e. diabetes, asthma, this includes High risk factors for pregnancy, etc.) ---Yes List chronic conditions. ---htn; rx Is this a behavioral health or substance abuse call? ---No Guidelines Guideline Title Affirmed Question Affirmed Notes Nurse Date/Time (Eastern Time) Breathing Difficulty [1] MODERATE difficulty breathing (e.g., speaks in phrases, SOB even at rest, pulse 100-120) Durene Cal, RN, Velna Hatchet 11/09/2021 4:14:28 PM Guidelines Guideline Title Affirmed Question Affirmed Notes Nurse Date/Time Eilene Ghazi Time) AND [2] NEW-onset or WORSE  than normal Disp. Time Eilene Ghazi Time) Disposition Final User 11/09/2021 4:09:24 PM Send to Urgent Kathalene Frames, [place] 11/09/2021 4:22:49 PM Go to ED Now Yes Durene Cal, RN, Berdie Ogren Disagree/Comply Comply Caller Understands Yes PreDisposition Call Doctor Care Advice Given Per Guideline GO TO ED NOW: * You need to be seen in the Emergency Department. NOTE TO TRIAGER - DRIVING: * Another adult should drive. Comments User: Ermalene Postin, RN Date/Time Eilene Ghazi Time): 11/09/2021 4:15:15 PM intermittent lightheadedness Referrals GO TO FACILITY OTHER - SPECIFY GO TO FACILITY REFUSED

## 2021-11-10 NOTE — Telephone Encounter (Signed)
LVM for patient to return call. Appointment is scheduled with Dr. Jerline Pain at [facility] today on 11/10/2021.

## 2021-11-13 ENCOUNTER — Other Ambulatory Visit: Payer: Self-pay | Admitting: Family Medicine

## 2021-11-13 LAB — CYTOLOGY - PAP
Comment: NEGATIVE
Diagnosis: NEGATIVE
High risk HPV: NEGATIVE

## 2021-11-14 ENCOUNTER — Telehealth: Payer: Self-pay | Admitting: Cardiology

## 2021-11-14 DIAGNOSIS — R002 Palpitations: Secondary | ICD-10-CM

## 2021-11-14 NOTE — Telephone Encounter (Signed)
Spoke with patient who reports having palpitations during the day for the past week. It does not get worse at night. She does not drink caffeinated beverages. Also, she feels she can't catch a full breath. While on the phone O2 sat 95%, P 74. Around 11 am today BP 88/59. It usually runs at 137/86. Please advise.

## 2021-11-14 NOTE — Telephone Encounter (Signed)
New Message:    Patient said she talked to a nurse last week and she was going to check with Dr Stanford Breed about adjusting her medicine. She wants to know what was decided.about her medicine. She is still having a lot of palpitations.    Patient c/o Palpitations:  High priority if patient c/o lightheadedness, shortness of breath, or chest pain  How long have you had palpitations/irregular HR/ Afib? Are you having the symptoms now? Having palpitations all the time, having them at this time  Are you currently experiencing lightheadedness, SOB or CP? Can not catch her breath  Do you have a history of afib (atrial fibrillation) or irregular heart rhythm?   Have you checked your BP or HR? (document readings if available) 88/59 earlier, at present it is : 132/85  Are you experiencing any other symptoms? no

## 2021-11-15 ENCOUNTER — Telehealth: Payer: Self-pay | Admitting: Family Medicine

## 2021-11-15 ENCOUNTER — Ambulatory Visit (INDEPENDENT_AMBULATORY_CARE_PROVIDER_SITE_OTHER): Payer: Medicare Other

## 2021-11-15 ENCOUNTER — Encounter: Payer: Self-pay | Admitting: *Deleted

## 2021-11-15 DIAGNOSIS — R002 Palpitations: Secondary | ICD-10-CM

## 2021-11-15 NOTE — Telephone Encounter (Signed)
Patient has jury duty on 3/1 tested  + for covid 1/31 and is having long term covid symptoms. Her heart doctor is sending her a heart monitor. She has an appt with heart doctor on 2/28- would like dr Jonni Sanger to please write a letter to be exempt from jury duty. Patient does not feel healthy to complete such task.

## 2021-11-15 NOTE — Telephone Encounter (Signed)
Spoke with pt, Aware of dr Jacalyn Lefevre recommendations.  She would like to wear a monitor will discuss length of monitor with dr Stanford Breed and place order.

## 2021-11-15 NOTE — Telephone Encounter (Signed)
See note

## 2021-11-15 NOTE — Progress Notes (Unsigned)
Enrolled for [employer] to mail a ZIO XT long term holter monitor to the patients address on file.  

## 2021-11-15 NOTE — Telephone Encounter (Signed)
Order placed fir 3 day zio

## 2021-11-20 NOTE — Telephone Encounter (Signed)
Please call patient. Letter has been sent via mychart.  Please ask if it needs to be faxed somewhere as well.   And stella, I was out of the office last week; this message should've been sent to someone to handle in my absence.   Fyi  Thanks.  Dr. Jonni Sanger

## 2021-11-20 NOTE — Progress Notes (Signed)
Office Visit    Patient Name: Erin West Date of Encounter: 11/21/2021  PCP:  Leamon Arnt, [place]  Cardiologist:  [name], MD  Advanced Practice Provider:  No care team member to display Electrophysiologist:  None    Chief Complaint    Erin West is a 71 y.o. female with a hx of palpitations, HTN, HLD presents today for palpitations.  Past Medical History    Past Medical History:  Diagnosis Date   Anxiety    Arthritis    Aspiration pneumonia ([place]) 2013   Atypical chest pain    Stress test 04/21/10 - post-stress EF=89%. Normal scan.   Diarrhea    Diastolic dysfunction    But normal LV function on ECHO 2011 and low risk Myoview in 2011   DVT (deep venous thrombosis) (HCC)    Dyslipidemia    GERD (gastroesophageal reflux disease)    Hypertension    Osteoarthritis    Osteopenia    Palpitations    [employer] MCT Monitor 04/16/10-04/22/10    Syncope    Pain-mediated syncope   Past Surgical History:  Procedure Laterality Date   CHOLECYSTECTOMY  2006   KNEE ARTHROSCOPY Right    REPLACEMENT TOTAL KNEE Left    left   REPLACEMENT TOTAL KNEE Right    TUBAL LIGATION  06/1978    Allergies  Allergies  Allergen Reactions   Penicillins Swelling and Rash    Has patient had a PCN reaction causing immediate rash, facial/tongue/throat swelling, SOB or lightheadedness with hypotension: No Has patient had a PCN reaction causing severe rash involving mucus membranes or skin necrosis: No Has patient had a PCN reaction that required hospitalization unknown Has patient had a PCN reaction occurring within the last 10 years: No If all of the above answers are "NO", then may proceed with Cephalosporin use.    Sulfa Antibiotics Swelling and Rash   Sulfasalazine Rash and Swelling   Tramadol Other (See Comments)    Rapid heart rate   Ciprofloxacin     Muscular pain   Trimethoprim     rash   Isovue [Iopamidol] Hives    Pt had  sneezing, itchy throat, a couple of hives and swollen, itchy left eye. Pt was given 50 mg po benadryl, and water.  Dr Carlis Abbott checked pt.  We observed pt for 30 mins w/ 5 minute BP checks.  Pt left w/o complication.  Pt will need full premeds in the future.  Alfonse Alpers, RTRCT    History of Present Illness    Erin West is a 71 y.o. female with a hx of palpitations, coronary artery calcification by CT,  HTN, HLD last seen 10/05/21  She follows closely with Dr. Stanford Breed due to palpitations. Nuclear study July 2011 with normal LVEF and no ischemic. Event monitor July 2011 with NSR. Echo 05/2018 normal LVEF, mild diastolic dysfunction. Monitor 06/2018 with NSR with PVC's.   She was evaluated in the ED 04/03/21 due to palpitations. Noted sensation of heart skipping beats. EKG showed NSR. She was given IVF due to soft blood pressure. HS troponin negative, K 4.2, Hb 14. CT chest with aortic atherosclerosis, 3 vessel coronary vascular calcification, several faint ground glass nodules recommended for non-contrast CT at 3-6 months.  Seen in follow up 04/14/21  Diltiazem 120mg  daily was added due to palpitations though did not tolerate due to fatigue. 05/2021 monitor ordered due to persistent palpitations revealed normal sinus rhythm, first-degree  AV block, 2 runs of SVT longest 17 beats at 170 bpm, rare PVC/PAC. Echo with LVEF 70%, no significant valvular abnormalities. Losartan previously reduced due to hypotension.  She contacted the office 11/14/2021 noting palpitations.  3-day ZIO was ordered.  She has not placed the monitor yet.  She reports today for follow-up. Tells me palpitations have been occurring daily associated with shortness of breath. Lasting seconds but occurring multiple times per day. Tells me they started getting worse immediately after having COVID at the end of January. She was recently prescribed Prednisone but did not take it.  No chest pain, pressure, tightness, orthopnea, PND.    EKGs/Labs/Other Studies Reviewed:   The following studies were reviewed today:  Echo 06/16/21 1. Left ventricular ejection fraction, by estimation, is 70 to 75%. The  left ventricle has hyperdynamic function. The left ventricle has no  regional wall motion abnormalities. Left ventricular diastolic parameters  were normal.   2. Right ventricular systolic function is normal. The right ventricular  size is normal.   3. The mitral valve is normal in structure. Trivial mitral valve  regurgitation.   4. The aortic valve is normal in structure. Aortic valve regurgitation is  trivial.   5. The inferior vena cava is normal in size with greater than 50%  respiratory variability, suggesting right atrial pressure of 3 mmHg.   Comparison(s): The left ventricular function is unchanged.   Monitor 05/2021 Patient had a min HR of 56 bpm, max HR of 167 bpm, and avg HR of 75 bpm. Predominant underlying rhythm was Sinus Rhythm. First Degree AV Block was present. 2 Supraventricular Tachycardia runs occurred, the run with the fastest interval lasting 4 beats  with a max rate of 167 bpm, the longest lasting 17 beats with an avg rate of 117 bpm. Isolated SVEs were rare (<1.0%), SVE Couplets were rare (<1.0%), and SVE Triplets were rare (<1.0%). Isolated VEs were rare (<1.0%), and no VE Couplets or VE Triplets  were present.   Sinus bradycardia, NSR, sinus tachycardia, occasional PAC, brief PAT and rare PVC. [name]  CT chest 04/06/21  IMPRESSION: 1. No consolidative changes. 2. Several faint ground-glass nodules in the lingula may represent areas of atelectasis or less likely developing atypical infection. Non-contrast chest CT at 3-6 months is recommended. If the nodules are stable at time of repeat CT, then future CT at 18-24 months (from today's scan) is considered optional for low-risk patients, but is recommended for high-risk patients. This recommendation follows the consensus statement:  Guidelines for Management of Incidental Pulmonary Nodules Detected on CT Images: From the Fleischner Society 2017; Radiology 2017; 284:228-243. 3. Aortic Atherosclerosis (ICD10-I70.0).   EKG:  No EKG today.   Recent Labs: 11/29/2020: ALT 22; TSH 2.56 04/03/2021: BUN 24; Creatinine, Ser 1.10; Hemoglobin 14.0; Platelets 239; Potassium 4.2; Sodium 139  Recent Lipid Panel    Component Value Date/Time   CHOL 180 11/29/2020 1108   TRIG 56.0 11/29/2020 1108   HDL 71.00 11/29/2020 1108   CHOLHDL 3 11/29/2020 1108   VLDL 11.2 11/29/2020 1108   LDLCALC 98 11/29/2020 1108   Home Medications   Current Meds  Medication Sig   acetaminophen (TYLENOL) 650 MG CR tablet Take 650 mg by mouth every 8 (eight) hours as needed for pain.   albuterol (VENTOLIN HFA) 108 (90 Base) MCG/ACT inhaler Inhale 2 puffs into the lungs every 4 (four) hours as needed for wheezing or shortness of breath.   ALPRAZolam (XANAX) 0.5 MG tablet Take 1  tablet (0.5 mg total) by mouth at bedtime.   atorvastatin (LIPITOR) 20 MG tablet Take 1 tablet (20 mg total) by mouth at bedtime.   benzonatate (TESSALON) 200 MG capsule Take 1 capsule (200 mg total) by mouth 2 (two) times daily as needed for cough.   carvedilol (COREG) 25 MG tablet Take 1 tablet (25 mg total) by mouth 2 (two) times daily.   cetirizine (ZYRTEC) 10 MG tablet TAKE 1 TABLET DAILY   Cholecalciferol (VITAMIN D) 2000 UNITS CAPS Take 2,000 Units by mouth daily.    ciclopirox (PENLAC) 8 % solution Apply topically at bedtime. Apply over nail and surrounding skin. Apply daily over previous coat. After seven (7) days, may remove with alcohol and continue cycle.   famotidine (PEPCID) 40 MG tablet Take 40 mg by mouth 2 (two) times daily.   fluticasone-salmeterol (ADVAIR HFA) 115-21 MCG/ACT inhaler Inhale 2 puffs into the lungs 2 (two) times daily.   ipratropium (ATROVENT) 0.03 % nasal spray Place 2 sprays into both nostrils every 12 (twelve) hours.   losartan (COZAAR) 25 MG  tablet Take 1 tablet (25 mg total) by mouth daily.   montelukast (SINGULAIR) 10 MG tablet Take 1 tablet (10 mg total) by mouth at bedtime.   Multiple Vitamin (MULTIVITAMIN) tablet Take 1 tablet by mouth daily.   pantoprazole (PROTONIX) 40 MG tablet Take 40 mg by mouth daily.   polyethylene glycol powder (GLYCOLAX/MIRALAX) powder Take 17 g by mouth daily as needed.   predniSONE (DELTASONE) 50 MG tablet Take 1 tablet daily for 5 days.   Probiotic Product (PROBIOTIC PO) Take 1 capsule by mouth daily.    solifenacin (VESICARE) 5 MG tablet Take 5 mg by mouth daily.     Review of Systems      All other systems reviewed and are otherwise negative except as noted above.  Physical Exam    VS:  BP 118/78    Pulse 82    Ht 5\' 3"  (1.6 m)    Wt 195 lb 4.8 oz (88.6 kg)    LMP 09/24/2012 (Approximate)    BMI 34.60 kg/m  , BMI Body mass index is 34.6 kg/m.  Wt Readings from Last 3 Encounters:  11/21/21 195 lb 4.8 oz (88.6 kg)  11/10/21 193 lb 3.2 oz (87.6 kg)  11/10/21 191 lb (86.6 kg)    GEN: Well nourished, well developed, in no acute distress. HEENT: normal. Neck: Supple, no JVD, carotid bruits, or masses. Cardiac: RRR, no murmurs, rubs, or gallops. No clubbing, cyanosis, edema.  Radials/PT 2+ and equal bilaterally.  Respiratory:  Respirations regular and unlabored, clear to auscultation bilaterally. GI: Soft, nontender, nondistended. MS: No deformity or atrophy. Skin: Warm and dry, no rash. Neuro:  Strength and sensation are intact. Psych: Normal affect. Anxious.  Assessment & Plan    Palpitations - Update TSH, CBC, BMP today to rule out thyroid, anemia, or electrolyte abnormality as contributory. Anticipate her recurrent palpitations are due to recent viral illness and anxiety. EKG from 11/10/21 reveals NSR. Echo 05/2021 with no significant valvular abnormalities. Continue Coreg 25mg  BID. Previously intolerant to Diltiazem. ZIO placed in clinic today as previously ordered by Dr. Stanford Breed. If  with significant arrhythmias, may need to transition from diltiazem to Metoprolol.   HTN -  BP well controlled. Continue current antihypertensive regimen.    Coronary artery calcification by CT - Stable with no anginal symptoms. No indication for ischemic evaluation.  GDMT includes atorvastatin, carvedilol. Heart healthy diet and regular cardiovascular exercise encouraged.  Ground glass nodules by CT - Followed by pulmonology.  Repeat CT scan 09/2021 showed nodules in the left lung had resolved another tiny nodules were stable.  No indication for repeat imaging.  HLD - 11/29/20 total cholesterol 180, HDL 71, LDL 98, triglycerides 56. Continue Atorvastatin 20mg  QD. Update lipid panel today  Disposition: Follow up in 1 month with Caron Presume, PA and in 6 months with Dr. Stanford Breed  Signed, Loel Dubonnet, NP 11/21/2021, 11:26 AM [place]

## 2021-11-20 NOTE — Telephone Encounter (Signed)
Pt called re jury duty letter- asking for md to please write one as jury duty date is for 11/22/21.-

## 2021-11-21 ENCOUNTER — Other Ambulatory Visit: Payer: Self-pay

## 2021-11-21 ENCOUNTER — Ambulatory Visit (INDEPENDENT_AMBULATORY_CARE_PROVIDER_SITE_OTHER): Payer: Medicare Other | Admitting: Family

## 2021-11-21 ENCOUNTER — Encounter (HOSPITAL_BASED_OUTPATIENT_CLINIC_OR_DEPARTMENT_OTHER): Payer: Self-pay | Admitting: Family

## 2021-11-21 VITALS — BP 118/78 | HR 82 | Ht 63.0 in | Wt 195.3 lb

## 2021-11-21 DIAGNOSIS — R002 Palpitations: Secondary | ICD-10-CM | POA: Diagnosis not present

## 2021-11-21 DIAGNOSIS — I10 Essential (primary) hypertension: Secondary | ICD-10-CM

## 2021-11-21 DIAGNOSIS — I2510 Atherosclerotic heart disease of native coronary artery without angina pectoris: Secondary | ICD-10-CM

## 2021-11-21 DIAGNOSIS — Z6834 Body mass index (BMI) 34.0-34.9, adult: Secondary | ICD-10-CM

## 2021-11-21 DIAGNOSIS — E782 Mixed hyperlipidemia: Secondary | ICD-10-CM

## 2021-11-21 NOTE — Telephone Encounter (Signed)
Spoke with patient, patient aware.

## 2021-11-21 NOTE — Patient Instructions (Signed)
Medication Instructions:  Continue your current medications.   *If you need a refill on your cardiac medications before your next appointment, please call your pharmacy*   Lab Work: Your physician recommends that you return for lab work today: thyroid panel, CMET, CBC with differential, magnesium, lipid panel  If you have labs (blood work) drawn today and your tests are completely normal, you will receive your results only by: [place] (if you have MyChart) OR A paper copy in the mail If you have any lab test that is abnormal or we need to change your treatment, we will call you to review the results.   Testing/Procedures: Your ZIO monitor was placed today. Please wear for 3-7 days.    Follow-Up: At [facility], you and your health needs are our priority.  As part of our continuing mission to provide you with exceptional heart care, we have created designated Provider Care Teams.  These Care Teams include your primary Cardiologist (physician) and Advanced Practice Providers (APPs -  Physician Assistants and Nurse Practitioners) who all work together to provide you with the care you need, when you need it.  We recommend signing up for the patient portal called "MyChart".  Sign up information is provided on this After Visit Summary.  MyChart is used to connect with patients for Virtual Visits (Telemedicine).  Patients are able to view lab/test results, encounter notes, upcoming appointments, etc.  Non-urgent messages can be sent to your provider as well.   To learn more about what you can do with MyChart, go to [email or web address].    Your next appointment:   As scheduled in March and September   Other Instructions  To prevent palpitations: Make sure you are adequately hydrated.  Avoid and/or limit caffeine containing beverages like soda or tea. Exercise regularly.  Manage stress well. Some over the counter medications can cause palpitations such as Benadryl, AdvilPM,  TylenolPM. Regular Advil or Tylenol do not cause palpitations.

## 2021-11-22 LAB — LIPID PANEL
Chol/HDL Ratio: 2.1 ratio (ref 0.0–4.4)
Cholesterol, Total: 151 mg/dL (ref 100–199)
HDL: 71 mg/dL (ref >39)
LDL Chol Calc (NIH): 69 mg/dL (ref 0–99)
Triglycerides: 54 mg/dL (ref 0–149)
VLDL Cholesterol Cal: 11 mg/dL (ref 5–40)

## 2021-11-22 LAB — CBC WITH DIFFERENTIAL/PLATELET
Basophils Absolute: 0.1 10*3/uL (ref 0.0–0.2)
Basos: 1 %
EOS (ABSOLUTE): 0.2 10*3/uL (ref 0.0–0.4)
Eos: 4 %
Hematocrit: 42.1 % (ref 34.0–46.6)
Hemoglobin: 13.8 g/dL (ref 11.1–15.9)
Immature Grans (Abs): 0 10*3/uL (ref 0.0–0.1)
Immature Granulocytes: 0 %
Lymphocytes Absolute: 1.3 10*3/uL (ref 0.7–3.1)
Lymphs: 25 %
MCH: 30.5 pg (ref 26.6–33.0)
MCHC: 32.8 g/dL (ref 31.5–35.7)
MCV: 93 fL (ref 79–97)
Monocytes Absolute: 0.4 10*3/uL (ref 0.1–0.9)
Monocytes: 9 %
Neutrophils Absolute: 3.1 10*3/uL (ref 1.4–7.0)
Neutrophils: 61 %
Platelets: 197 10*3/uL (ref 150–450)
RBC: 4.52 x10E6/uL (ref 3.77–5.28)
RDW: 14.1 % (ref 11.7–15.4)
WBC: 5.2 10*3/uL (ref 3.4–10.8)

## 2021-11-22 LAB — COMPREHENSIVE METABOLIC PANEL
ALT: 16 IU/L (ref 0–32)
AST: 16 IU/L (ref 0–40)
Albumin/Globulin Ratio: 1.8 (ref 1.2–2.2)
Albumin: 4.2 g/dL (ref 3.8–4.8)
Alkaline Phosphatase: 96 IU/L (ref 44–121)
BUN/Creatinine Ratio: 21 (ref 12–28)
BUN: 18 mg/dL (ref 8–27)
Bilirubin Total: 0.6 mg/dL (ref 0.0–1.2)
CO2: 24 mmol/L (ref 20–29)
Calcium: 9.5 mg/dL (ref 8.7–10.3)
Chloride: 103 mmol/L (ref 96–106)
Creatinine, Ser: 0.87 mg/dL (ref 0.57–1.00)
Globulin, Total: 2.3 g/dL (ref 1.5–4.5)
Glucose: 81 mg/dL (ref 70–99)
Potassium: 4.8 mmol/L (ref 3.5–5.2)
Sodium: 141 mmol/L (ref 134–144)
Total Protein: 6.5 g/dL (ref 6.0–8.5)
eGFR: 72 mL/min/{1.73_m2} (ref >59)

## 2021-11-22 LAB — THYROID PANEL WITH TSH
Free Thyroxine Index: 2.7 (ref 1.2–4.9)
T3 Uptake Ratio: 32 % (ref 24–39)
T4, Total: 8.4 ug/dL (ref 4.5–12.0)
TSH: 2.22 u[IU]/mL (ref 0.450–4.500)

## 2021-11-22 LAB — MAGNESIUM: Magnesium: 2.4 mg/dL — ABNORMAL HIGH (ref 1.6–2.3)

## 2021-11-24 ENCOUNTER — Other Ambulatory Visit: Payer: Self-pay

## 2021-11-24 ENCOUNTER — Encounter: Payer: Self-pay | Admitting: Family Medicine

## 2021-11-24 ENCOUNTER — Ambulatory Visit (INDEPENDENT_AMBULATORY_CARE_PROVIDER_SITE_OTHER): Payer: Medicare Other | Admitting: Family Medicine

## 2021-11-24 VITALS — BP 140/80 | HR 107 | Temp 97.7°F | Ht 63.0 in

## 2021-11-24 DIAGNOSIS — R000 Tachycardia, unspecified: Secondary | ICD-10-CM

## 2021-11-24 DIAGNOSIS — R051 Acute cough: Secondary | ICD-10-CM | POA: Diagnosis not present

## 2021-11-24 DIAGNOSIS — R0600 Dyspnea, unspecified: Secondary | ICD-10-CM | POA: Diagnosis not present

## 2021-11-24 NOTE — Progress Notes (Signed)
? ?Subjective:  ? ? ? Patient ID: Erin West, female    DOB: June 18, 1951, 71 y.o.   MRN: [id number] ? ?Chief Complaint  ?Patient presents with  ? Follow-up  ?  Post Covid follow-up, still having cough, elevated HR today, fatigue ?  ? ? ?HPI ?Covid dx 10/24/21.  Took Molupiravir.Marland Kitchen  then palpitations.  Has seen Card-has zio.  Did labs ?Here as pulse is high today-up to 122.  Was feeling "funny-dizzy".  No cp. Blood pressure was 140/82.  At times, bp very low.   Wearing Zio xince 2/28.   Feels bad when bp low.  ?Cough was getting better but back past 2 days. A little coughing. Not productive. No f/c.  Tired some. Hard to get deep breath for 1 mo at times. but no SOB ?Labs were normal ? ?Using MDI bid but not today.  No caffeine ? ? ?There are no preventive care reminders to display for this patient. ? ?Past Medical History:  ?Diagnosis Date  ? Anxiety   ? Arthritis   ? Aspiration pneumonia ([place]) 2013  ? Atypical chest pain   ? Stress test 04/21/10 - post-stress EF=89%. Normal scan.  ? Diarrhea   ? Diastolic dysfunction   ? But normal LV function on ECHO 2011 and low risk Myoview in 2011  ? DVT (deep venous thrombosis) ([name])   ? Dyslipidemia   ? GERD (gastroesophageal reflux disease)   ? Hypertension   ? Osteoarthritis   ? Osteopenia   ? Palpitations   ? [employer] MCT Monitor 04/16/10-04/22/10   ? Syncope   ? Pain-mediated syncope  ? ? ?Past Surgical History:  ?Procedure Laterality Date  ? CHOLECYSTECTOMY  2006  ? KNEE ARTHROSCOPY Right   ? REPLACEMENT TOTAL KNEE Left   ? left  ? REPLACEMENT TOTAL KNEE Right   ? TUBAL LIGATION  06/1978  ? ? ?Outpatient Medications Prior to Visit  ?Medication Sig Dispense Refill  ? acetaminophen (TYLENOL) 650 MG CR tablet Take 650 mg by mouth every 8 (eight) hours as needed for pain.    ? albuterol (VENTOLIN HFA) 108 (90 Base) MCG/ACT inhaler Inhale 2 puffs into the lungs every 4 (four) hours as needed for wheezing or shortness of breath. 1 each 2  ? ALPRAZolam (XANAX) 0.5 MG tablet Take 1  tablet (0.5 mg total) by mouth at bedtime. 90 tablet 1  ? atorvastatin (LIPITOR) 20 MG tablet Take 1 tablet (20 mg total) by mouth at bedtime. 90 tablet 3  ? carvedilol (COREG) 25 MG tablet Take 1 tablet (25 mg total) by mouth 2 (two) times daily. 180 tablet 1  ? cetirizine (ZYRTEC) 10 MG tablet TAKE 1 TABLET DAILY 90 tablet 3  ? Cholecalciferol (VITAMIN D) 2000 UNITS CAPS Take 2,000 Units by mouth daily.     ? ciclopirox (PENLAC) 8 % solution Apply topically at bedtime. Apply over nail and surrounding skin. Apply daily over previous coat. After seven (7) days, may remove with alcohol and continue cycle. 6.6 mL 0  ? famotidine (PEPCID) 40 MG tablet Take 40 mg by mouth 2 (two) times daily.    ? fluticasone-salmeterol (ADVAIR HFA) 115-21 MCG/ACT inhaler Inhale 2 puffs into the lungs 2 (two) times daily. 36 g 3  ? ipratropium (ATROVENT) 0.03 % nasal spray Place 2 sprays into both nostrils every 12 (twelve) hours. 90 mL 3  ? losartan (COZAAR) 25 MG tablet Take 1 tablet (25 mg total) by mouth daily. 90 tablet 3  ? montelukast (SINGULAIR)  10 MG tablet Take 1 tablet (10 mg total) by mouth at bedtime. 90 tablet 3  ? Multiple Vitamin (MULTIVITAMIN) tablet Take 1 tablet by mouth daily.    ? pantoprazole (PROTONIX) 40 MG tablet Take 40 mg by mouth daily.    ? polyethylene glycol powder (GLYCOLAX/MIRALAX) powder Take 17 g by mouth daily as needed. 3350 g 1  ? Probiotic Product (PROBIOTIC PO) Take 1 capsule by mouth daily.     ? solifenacin (VESICARE) 5 MG tablet Take 5 mg by mouth daily.    ? benzonatate (TESSALON) 200 MG capsule Take 1 capsule (200 mg total) by mouth 2 (two) times daily as needed for cough. (Patient not taking: Reported on 11/24/2021) 20 capsule 0  ? predniSONE (DELTASONE) 50 MG tablet Take 1 tablet daily for 5 days. (Patient not taking: Reported on 11/24/2021) 5 tablet 0  ? ?No facility-administered medications prior to visit.  ? ? ?Allergies  ?Allergen Reactions  ? Penicillins Swelling and Rash  ?  Has patient  had a PCN reaction causing immediate rash, facial/tongue/throat swelling, SOB or lightheadedness with hypotension: No ?Has patient had a PCN reaction causing severe rash involving mucus membranes or skin necrosis: No ?Has patient had a PCN reaction that required hospitalization unknown ?Has patient had a PCN reaction occurring within the last 10 years: No ?If all of the above answers are "NO", then may proceed with Cephalosporin use. ?  ? Sulfa Antibiotics Swelling and Rash  ? Sulfasalazine Rash and Swelling  ? Tramadol Other (See Comments)  ?  Rapid heart rate  ? Ciprofloxacin   ?  Muscular pain  ? Trimethoprim   ?  rash  ? Isovue [Iopamidol] Hives  ?  Pt had sneezing, itchy throat, a couple of hives and swollen, itchy left eye. Pt was given 50 mg po benadryl, and water.  Dr Carlis Abbott checked pt.  We observed pt for 30 mins w/ 5 minute BP checks.  Pt left w/o complication.  Pt will need full premeds in the future.  J Bohm, RTRCT  ? ?ROS neg/noncontributory except as noted HPI/below ?Husb deceased 1.5 yrs ago-getting anxiety and some memory issues ? ? ?   ?Objective:  ?  ? ?BP 140/80   Pulse (!) 107   Temp 97.7 ?F (36.5 ?C) (Temporal)   Ht 5\' 3"  (1.6 m)   LMP 09/24/2012 (Approximate)   SpO2 97%   BMI 34.60 kg/m?  ?Wt Readings from Last 3 Encounters:  ?11/21/21 195 lb 4.8 oz (88.6 kg)  ?11/10/21 193 lb 3.2 oz (87.6 kg)  ?11/10/21 191 lb (86.6 kg)  ? ? ?   ? ?Gen: WDWN NAD OWF ?HEENT: NCAT, conjunctiva not injected, sclera nonicteric ?NECK:  supple, no thyromegaly, no nodes, no carotid bruits ?CARDIAC: tachy RRR, S1S2+, no murmur. ?LUNGS: CTAB. No wheezes ?EXT:  no edema ?MSK: no gross abnormalities.  ?NEURO: A&O x3.  CN II-XII intact.  ?PSYCH: normal mood. Good eye contact ? ?Assessment & Plan:  ? ?Problem List Items Addressed This Visit   ?None ?Visit Diagnoses   ? ? Tachycardia    -  Primary  ? Dyspnea, unspecified type      ? ?  ? ? Tachycardia-stop MDI.  Has zio.  Labs normal.  Can try extra 1/2 coreg if  needed.  Nibble of xanax.  ER if symptoms continue(declines now). ?Cough-?etiol-would have to go to UC or ER for cxr-not wanting to now.  If cont, worse, ER.  Don't want to do abx  as no clear indication ?Intermitt dyspnea-more of trying to get deep breath.   ? ?No orders of the defined types were placed in this encounter. ? ? ?Wellington Hampshire, MD ? ?

## 2021-11-24 NOTE — Patient Instructions (Signed)
Try a crumb of xanax daytime for breathing ?If Heart rate too high-take 1/4-1/2 extra carvedilol.  ? ?If worse, ER.  ? ?Kardia device ?

## 2021-11-25 ENCOUNTER — Other Ambulatory Visit: Payer: Self-pay | Admitting: Physician Assistant

## 2021-11-25 ENCOUNTER — Other Ambulatory Visit: Payer: Self-pay | Admitting: Cardiology

## 2021-11-25 MED ORDER — CARVEDILOL 25 MG PO TABS: 25.0000 mg | ORAL_TABLET | Freq: Two times a day (BID) | ORAL | 0 refills | Status: DC

## 2021-11-28 ENCOUNTER — Encounter (HOSPITAL_BASED_OUTPATIENT_CLINIC_OR_DEPARTMENT_OTHER): Payer: Self-pay

## 2021-12-04 DIAGNOSIS — R002 Palpitations: Secondary | ICD-10-CM | POA: Diagnosis not present

## 2021-12-05 ENCOUNTER — Encounter: Payer: Self-pay | Admitting: Family Medicine

## 2021-12-05 ENCOUNTER — Ambulatory Visit (INDEPENDENT_AMBULATORY_CARE_PROVIDER_SITE_OTHER): Payer: Medicare Other | Admitting: Family Medicine

## 2021-12-05 VITALS — BP 114/77 | HR 73 | Temp 97.7°F | Ht 63.0 in | Wt 195.6 lb

## 2021-12-05 DIAGNOSIS — J302 Other seasonal allergic rhinitis: Secondary | ICD-10-CM | POA: Diagnosis not present

## 2021-12-05 DIAGNOSIS — I10 Essential (primary) hypertension: Secondary | ICD-10-CM

## 2021-12-05 DIAGNOSIS — R053 Chronic cough: Secondary | ICD-10-CM

## 2021-12-05 DIAGNOSIS — J324 Chronic pansinusitis: Secondary | ICD-10-CM

## 2021-12-05 DIAGNOSIS — F321 Major depressive disorder, single episode, moderate: Secondary | ICD-10-CM | POA: Diagnosis not present

## 2021-12-05 DIAGNOSIS — E782 Mixed hyperlipidemia: Secondary | ICD-10-CM | POA: Diagnosis not present

## 2021-12-05 DIAGNOSIS — Z79899 Other long term (current) drug therapy: Secondary | ICD-10-CM

## 2021-12-05 MED ORDER — AZITHROMYCIN 250 MG PO TABS
ORAL_TABLET | ORAL | 0 refills | Status: DC
Start: 2021-12-05 — End: 2021-12-19

## 2021-12-05 NOTE — Progress Notes (Signed)
? ?Subjective  ?CC:  ?Chief Complaint  ?Patient presents with  ? Hypertension  ? Depression  ? Hyperlipidemia  ? Osteoarthritis  ? Cough  ?  Patient recently hd Covid. She states cough has worsened Friday. It is productive, she states color is pale green. She has taken OTC Delsym.  ? ? ?HPI: Erin West is a 71 y.o. female who presents to the office today to address the problems listed above in the chief complaint. ?Hypertension f/u: Control is good . Pt reports she is doing well. taking medications as instructed, no medication side effects noted, no TIAs, no chest pain on exertion, no dyspnea on exertion, no swelling of ankles. She denies adverse effects from his BP medications. Compliance with medication is good.  ?Chronic cough evaluated by pulmonology and GI.  On PPI and allergy medicines.  Requesting allergy referral.  She is seeing allergist once many years ago.  Had multiple allergies to outdoor environmental things.  She does note that over the last week, her cough is worsened with productive cough.  No significant sinus pain but purulent drainage present.  Has history of pansinusitis.  No fevers or chills.  She did have COVID back in January.  The cough improved until about a week ago. ?Hyperlipidemia on Zetia tolerating well. ?Mood is stable on chronic benzos. ? ?Assessment  ?1. Essential hypertension   ?2. Chronic cough   ?3. Chronic prescription benzodiazepine use   ?4. Mixed hyperlipidemia   ?5. Chronic seasonal allergic rhinitis   ?6. Chronic pansinusitis   ?7. Depression, major, single episode, moderate (HCC) Chronic  ? ?  ?Plan  ? ?Hypertension f/u: BP control is well controlled.  Continue current medications. ?Hyperlipidemia f/u: Stable on Zetia ?Depression stable.  Sleep is good on Xanax. ?Possible acute sinusitis on history of chronic sinusitis and chronic allergies in setting of chronic cough that is worsening.  Trial of Z-Pak.  Refer to allergist as requested. ?Health maintenance  up-to-date ? ?Education regarding management of these chronic disease states was given. Management strategies discussed on successive visits include dietary and exercise recommendations, goals of achieving and maintaining IBW, and lifestyle modifications aiming for adequate sleep and minimizing stressors.  ? ?Follow up: 6 months for recheck ? ?Orders Placed This Encounter  ?Procedures  ? Ambulatory referral to Allergy  ? ?Meds ordered this encounter  ?Medications  ? azithromycin (ZITHROMAX) 250 MG tablet  ?  Sig: Take 2 tabs today, then 1 tab daily for 4 days  ?  Dispense:  1 each  ?  Refill:  0  ? ?  ? ?BP Readings from Last 3 Encounters:  ?12/05/21 114/77  ?11/24/21 140/80  ?11/21/21 118/78  ? ?Wt Readings from Last 3 Encounters:  ?12/05/21 195 lb 9.6 oz (88.7 kg)  ?11/21/21 195 lb 4.8 oz (88.6 kg)  ?11/10/21 193 lb 3.2 oz (87.6 kg)  ? ? ?Lab Results  ?Component Value Date  ? CHOL 151 11/21/2021  ? CHOL 180 11/29/2020  ? CHOL 163 10/28/2019  ? ?Lab Results  ?Component Value Date  ? HDL 71 11/21/2021  ? HDL 71.00 11/29/2020  ? HDL 53.00 10/28/2019  ? ?Lab Results  ?Component Value Date  ? [place] 69 11/21/2021  ? [place] 98 11/29/2020  ? [place] 94 10/28/2019  ? ?Lab Results  ?Component Value Date  ? TRIG 54 11/21/2021  ? TRIG 56.0 11/29/2020  ? TRIG 80.0 10/28/2019  ? ?Lab Results  ?Component Value Date  ? CHOLHDL 2.1 11/21/2021  ? CHOLHDL  3 11/29/2020  ? CHOLHDL 3 10/28/2019  ? ?No results found for: LDLDIRECT ?Lab Results  ?Component Value Date  ? CREATININE 0.87 11/21/2021  ? BUN 18 11/21/2021  ? NA 141 11/21/2021  ? K 4.8 11/21/2021  ? CL 103 11/21/2021  ? CO2 24 11/21/2021  ? ? ?The 10-year ASCVD risk score (Arnett DK, et al., 2019) is: 14.2% ?  Values used to calculate the score: ?    Age: 32 years ?    Sex: Female ?    Is Non-Hispanic African American: No ?    Diabetic: No ?    Tobacco smoker: Yes ?    Systolic Blood Pressure: 478 mmHg ?    Is BP treated: Yes ?    HDL Cholesterol: 71 mg/dL ?    Total  Cholesterol: 151 mg/dL ? ?I reviewed the patients updated PMH, FH, and SocHx.  ?  ?Patient Active Problem List  ? Diagnosis Date Noted  ? Chronic prescription benzodiazepine use 09/20/2021  ?  Priority: High  ? Depression, major, single episode, moderate ([place]) 11/02/2020  ?  Priority: High  ? Mixed hyperlipidemia 12/04/2017  ?  Priority: High  ? Chronic cough 07/24/2017  ?  Priority: High  ? Essential hypertension   ?  Priority: High  ? Osteoarthritis of left glenohumeral joint 04/19/2020  ?  Priority: Medium   ? OAB (overactive bladder) 10/28/2019  ?  Priority: Medium   ? Chronic seasonal allergic rhinitis 12/04/2017  ?  Priority: Medium   ? Chronic pansinusitis 09/09/2017  ?  Priority: Medium   ? Laryngopharyngeal reflux (LPR) 09/09/2017  ?  Priority: Medium   ? Bilateral leg edema 03/30/2014  ?  Priority: Medium   ? History of gastritis 02/10/2014  ?  Priority: Medium   ? Dysphagia 01/28/2014  ?  Priority: Medium   ? Irritable bowel syndrome (IBS) 09/08/2013  ?  Priority: Medium   ? Osteopenia 07/14/2013  ?  Priority: Medium   ? Osteoarthritis, multiple sites 07/14/2013  ?  Priority: Medium   ? Gastroesophageal reflux disease without esophagitis   ?  Priority: Medium   ? Atrophic vaginitis 10/22/2018  ?  Priority: Low  ? Status post total right knee replacement 01/24/2016  ?  Priority: Low  ? ? ?Allergies: Penicillins, Sulfa antibiotics, Sulfasalazine, Tramadol, Ciprofloxacin, Trimethoprim, and Isovue [iopamidol] ? ?Social History: ?Patient  reports that she has never smoked. She has never used smokeless tobacco. She reports current alcohol use. She reports that she does not use drugs. ? ?Current Meds  ?Medication Sig  ? acetaminophen (TYLENOL) 650 MG CR tablet Take 650 mg by mouth every 8 (eight) hours as needed for pain.  ? albuterol (VENTOLIN HFA) 108 (90 Base) MCG/ACT inhaler Inhale 2 puffs into the lungs every 4 (four) hours as needed for wheezing or shortness of breath.  ? ALPRAZolam (XANAX) 0.5 MG tablet  Take 1 tablet (0.5 mg total) by mouth at bedtime.  ? atorvastatin (LIPITOR) 20 MG tablet Take 1 tablet (20 mg total) by mouth at bedtime.  ? azithromycin (ZITHROMAX) 250 MG tablet Take 2 tabs today, then 1 tab daily for 4 days  ? carvedilol (COREG) 25 MG tablet Take 1 tablet (25 mg total) by mouth 2 (two) times daily.  ? cetirizine (ZYRTEC) 10 MG tablet TAKE 1 TABLET DAILY  ? Cholecalciferol (VITAMIN D) 2000 UNITS CAPS Take 2,000 Units by mouth daily.   ? ciclopirox (PENLAC) 8 % solution Apply topically at bedtime. Apply over nail  and surrounding skin. Apply daily over previous coat. After seven (7) days, may remove with alcohol and continue cycle.  ? famotidine (PEPCID) 40 MG tablet Take 40 mg by mouth 2 (two) times daily.  ? fluticasone-salmeterol (ADVAIR HFA) 115-21 MCG/ACT inhaler Inhale 2 puffs into the lungs 2 (two) times daily.  ? losartan (COZAAR) 25 MG tablet Take 1 tablet (25 mg total) by mouth daily.  ? montelukast (SINGULAIR) 10 MG tablet Take 1 tablet (10 mg total) by mouth at bedtime.  ? Multiple Vitamin (MULTIVITAMIN) tablet Take 1 tablet by mouth daily.  ? pantoprazole (PROTONIX) 40 MG tablet Take 40 mg by mouth daily.  ? polyethylene glycol powder (GLYCOLAX/MIRALAX) powder Take 17 g by mouth daily as needed.  ? Probiotic Product (PROBIOTIC PO) Take 1 capsule by mouth daily.   ? solifenacin (VESICARE) 5 MG tablet Take 5 mg by mouth daily.  ? ? ?Review of Systems: ?Cardiovascular: negative for chest pain, palpitations, leg swelling, orthopnea ?Respiratory: negative for SOB, wheezing or persistent cough ?Gastrointestinal: negative for abdominal pain ?Genitourinary: negative for dysuria or gross hematuria ? ?Objective  ?Vitals: BP 114/77   Pulse 73   Temp 97.7 ?F (36.5 ?C) (Temporal)   Ht '5\' 3"'$  (1.6 m)   Wt 195 lb 9.6 oz (88.7 kg)   LMP 09/24/2012 (Approximate)   SpO2 94%   BMI 34.65 kg/m?  ?General: no acute distress, thick cough present ?Psych:  Alert and oriented, normal mood and  affect ?HEENT:  Normocephalic, atraumatic, supple neck  ?Cardiovascular:  RRR without murmur. no edema ?Respiratory:  Good breath sounds bilaterally, CTAB with normal respiratory effort ?Skin:  Warm, no rashes ?Neurologic:   Mental status

## 2021-12-05 NOTE — Patient Instructions (Signed)
Please return in 6 months for hypertension follow up.  ? ?We will call you to get you set up with allergy and asthma specialists.  ?Take the Zpak to see if it helps. It may or may not.  ? ?Other things all look good! ? ?If you have any questions or concerns, please don't hesitate to send me a message via MyChart or call the office at [phone]. Thank you for visiting with [place] today! It's our pleasure caring for you.  ?

## 2021-12-06 ENCOUNTER — Telehealth: Payer: Self-pay | Admitting: Cardiology

## 2021-12-06 NOTE — Telephone Encounter (Signed)
Patient calling for monitor results. 

## 2021-12-06 NOTE — Telephone Encounter (Signed)
Spoke with patient regarding the following results. Patient made aware and patient verbalized understanding.  ? ?Erin Perla, MD    ?  ?No significant arrhythmia other than occasional PVC.  Continue present medications and follow. ?[name]  ? ?Advised patient to call back to office with any issues, questions, or concerns. Patient verbalized understanding.  ? ?

## 2021-12-07 ENCOUNTER — Other Ambulatory Visit: Payer: Self-pay

## 2021-12-07 DIAGNOSIS — R062 Wheezing: Secondary | ICD-10-CM | POA: Diagnosis not present

## 2021-12-07 DIAGNOSIS — J111 Influenza due to unidentified influenza virus with other respiratory manifestations: Secondary | ICD-10-CM | POA: Diagnosis not present

## 2021-12-07 DIAGNOSIS — R6883 Chills (without fever): Secondary | ICD-10-CM | POA: Diagnosis not present

## 2021-12-07 DIAGNOSIS — R051 Acute cough: Secondary | ICD-10-CM | POA: Diagnosis not present

## 2021-12-07 MED ORDER — CARVEDILOL 25 MG PO TABS
25.0000 mg | ORAL_TABLET | Freq: Two times a day (BID) | ORAL | 3 refills | Status: DC
Start: 2021-12-07 — End: 2022-01-01

## 2021-12-09 ENCOUNTER — Telehealth: Payer: Self-pay | Admitting: Physician Assistant

## 2021-12-09 NOTE — Telephone Encounter (Signed)
Telephone call ?12/09/21 2:10 pm ? ?Patient called on service over the weekend to report that she went to see her PCP for a cough/wheeze last week and was started on Prednisone, Colonoscopy scheduled 3/27 and patient wanted to make sure this wouldn't interfere.  Tells me she is supposed to start [place] tomorrow. ? ?Reassured her that it is still ok to go ahead with procedure. Reiterated that the only reason Dr. Collene Mares she might want to delay the procedure is if she is still having breathing difficulties.  Asked her to check in with Dr. Lorie Apley office next week- maybe wed/Thursday if she is still not feeling well respiratory wise. ? ?Erin Newer, PA-C ? ? ?

## 2021-12-11 ENCOUNTER — Ambulatory Visit (HOSPITAL_COMMUNITY): Payer: Self-pay

## 2021-12-19 ENCOUNTER — Encounter: Payer: Self-pay | Admitting: Pulmonary Disease

## 2021-12-19 ENCOUNTER — Other Ambulatory Visit: Payer: Self-pay

## 2021-12-19 ENCOUNTER — Ambulatory Visit (INDEPENDENT_AMBULATORY_CARE_PROVIDER_SITE_OTHER): Payer: Medicare Other | Admitting: Pulmonary Disease

## 2021-12-19 ENCOUNTER — Telehealth: Payer: Self-pay | Admitting: Family Medicine

## 2021-12-19 VITALS — BP 120/72 | HR 75 | Ht 63.0 in | Wt 194.6 lb

## 2021-12-19 DIAGNOSIS — R0982 Postnasal drip: Secondary | ICD-10-CM | POA: Diagnosis not present

## 2021-12-19 MED ORDER — FLUTICASONE PROPIONATE 50 MCG/ACT NA SUSP: 2.0000 | Freq: Every day | NASAL | 2 refills | Status: DC

## 2021-12-19 MED ORDER — IPRATROPIUM BROMIDE 0.03 % NA SOLN: 2.0000 | Freq: Two times a day (BID) | NASAL | 12 refills | Status: DC | PRN

## 2021-12-19 NOTE — Patient Instructions (Addendum)
Your cough is multifactorial due to reflux disease and post-nasal drainage ? ?Continue zyrtec and montelukast daily ? ?Use fluticasone nasal spray daily, 2 sprays per nostril ? ?Use ipratropium nasal spray 2 sprays per nostril twice daily ? ?Continue pantoprazole daily before breakfast and famotidine twice daily for reflux ? ?Talk with your primary care doctor about taking a break from Cozaar as this can lead to cough  ? ?Follow up in 6 months ? ?

## 2021-12-19 NOTE — Telephone Encounter (Signed)
Pt states her Pulmonologist is wanting her to go off the Cozaar for awhile. They believe this is what is causing the coughing. Pt is asking if there is something she can take in place of it. Please advise. ?

## 2021-12-19 NOTE — Progress Notes (Signed)
? ?Synopsis: Referred in June 2022 for Bronchitis by Erin Fiedler, NP ? ?Subjective:  ? ?PATIENT ID: Erin West GENDER: female DOB: 11-22-1950, MRN: [id number] ? ?HPI ? ?Chief Complaint  ?Patient presents with  ? Follow-up  ?  Increased SOB and wheezing for the past 2 weeks. States she had COVID back in February 2023.   ? ? ?Erin West is a 71 year old woman, never smoker with history of GERD, hypertension and DVT who returns to pulmonary clinic for evaluation of cough.  ? ?She has been experiencing shortness of breath and wheezing past 2 weeks and she reports having COVID in February of this year.  Her cough was worse when she was sick with COVID but then the cough got better for a couple of weeks but has since returned and has been bothering her.  She is still having issues with GERD despite taking PPI 30 minutes before breakfast and Pepcid twice daily along with sleeping with the head of her bed elevated.  She also reports postnasal drainage since last week and is using a Nettie pot.  She continues to take Zyrtec daily and Singulair daily.  She is using Advair 2 puffs twice daily.  She is not currently using any nasal sprays. ? ?She was seen by ENT 05/2021 where flexible laryngoscopy showed laryngopharyngeal reflux. ? ?OV 05/17/22 ?She reports improvement in her cough with starting inhaler therapy with advair 115-55mg 2 puffs twice daily since last visit along with fluticasone and ipratropium nasal sprays for post nasal drainage. She rates her cough at a level 4 currently where is was previously an 8-9 (10 being the worst). She continues to have issues with GERD despite using pantoprazole '40mg'$  daily and elevated the head of her bed at night.  ? ?We discussed her CT Chest scan over the phone. She has multiple subcentimeter nodules that will require follow up in the future. 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  ? ?She has referral to ENT clinic for  evaluation of the cough and is awaiting to schedule this visit. ? ?OV 02/2021 ?She reports having 2 episodes of bronchitis per year since 2018 which include prolonged symptoms of wheezing, cough and shortness of breath.  Her most recent episode was last month where she received an albuterol inhaler, azithromycin and a course of prednisone which helped her symptoms resolved.  She reports the cough is mainly dry.  The albuterol does help somewhat with the wheezing.  She does have episodes of sinus congestion and postnasal drainage when she is acutely ill with these episodes.  She also complains of bad heartburn/reflux symptoms and does note that her reflux symptoms were increased prior to her last episodes of bronchitis.  She denies any seasonal allergy symptoms or worsening of cough or wheezing with cold air or really hot weather. ? ?She is currently taking pantoprazole 40 mg daily for her GERD.  This was recently increased about 2 months ago from 20 mg daily.  She mainly sleeps flat at night on her sides but is unable to lay flat on her back.  She does have a height adjustable bed frame but does not sleep with the head of the bed elevated at this time. ? ?She does report some nasal congestion and postnasal drainage.  She is currently using fluticasone nasal spray 2 sprays per nostril daily. ? ?Past Medical History:  ?Diagnosis Date  ? Anxiety   ? Arthritis   ?  Aspiration pneumonia ([place]) 2013  ? Atypical chest pain   ? Stress test 04/21/10 - post-stress EF=89%. Normal scan.  ? Diarrhea   ? Diastolic dysfunction   ? But normal LV function on ECHO 2011 and low risk Myoview in 2011  ? DVT (deep venous thrombosis) ([place])   ? Dyslipidemia   ? GERD (gastroesophageal reflux disease)   ? Hypertension   ? Osteoarthritis   ? Osteopenia   ? Palpitations   ? [employer] MCT Monitor 04/16/10-04/22/10   ? Syncope   ? Pain-mediated syncope  ?  ? ?Family History  ?Problem Relation Age of Onset  ? Heart disease Mother   ? Other Mother   ?      leg amputation  ? Arthritis Mother   ? Osteoporosis Mother   ? Mental illness Father   ? Sudden death Father   ? Arthritis Father   ? Thyroid disease Father   ? Stroke Paternal Grandfather   ?  ? ?Social History  ? ?Socioeconomic History  ? Marital status: Widowed  ?  Spouse name: Not on file  ? Number of children: 3  ? Years of education: Not on file  ? Highest education level: Not on file  ?Occupational History  ? Not on file  ?Tobacco Use  ? Smoking status: Never  ? Smokeless tobacco: Never  ?Vaping Use  ? Vaping Use: Never used  ?Substance and Sexual Activity  ? Alcohol use: Yes  ?  Alcohol/week: 0.0 standard drinks  ?  Comment: 2-3/wine per month  ? Drug use: No  ? Sexual activity: Not Currently  ?  Comment: 1st intercourse 71 yo-Fewer than 5 partners  ?Other Topics Concern  ? Not on file  ?Social History Narrative  ? Not on file  ? ?Social Determinants of Health  ? ?Financial Resource Strain: Low Risk   ? Difficulty of Paying Living Expenses: Not hard at all  ?Food Insecurity: No Food Insecurity  ? Worried About Charity fundraiser in the Last Year: Never true  ? Ran Out of Food in the Last Year: Never true  ?Transportation Needs: No Transportation Needs  ? Lack of Transportation (Medical): No  ? Lack of Transportation (Non-Medical): No  ?Physical Activity: Sufficiently Active  ? Days of Exercise per Week: 4 days  ? Minutes of Exercise per Session: 60 min  ?Stress: No Stress Concern Present  ? Feeling of Stress : Only a little  ?Social Connections: Moderately Isolated  ? Frequency of Communication with Friends and Family: More than three times a week  ? Frequency of Social Gatherings with Friends and Family: More than three times a week  ? Attends Religious Services: 1 to 4 times per year  ? Active Member of Clubs or Organizations: No  ? Attends Archivist Meetings: Never  ? Marital Status: Widowed  ?Intimate Partner Violence: Not At Risk  ? Fear of Current or Ex-Partner: No  ? Emotionally Abused:  No  ? Physically Abused: No  ? Sexually Abused: No  ?  ? ?Allergies  ?Allergen Reactions  ? Penicillins Swelling and Rash  ?  Has patient had a PCN reaction causing immediate rash, facial/tongue/throat swelling, SOB or lightheadedness with hypotension: No ?Has patient had a PCN reaction causing severe rash involving mucus membranes or skin necrosis: No ?Has patient had a PCN reaction that required hospitalization unknown ?Has patient had a PCN reaction occurring within the last 10 years: No ?If all of the above answers are "NO",  then may proceed with Cephalosporin use. ?  ? Sulfa Antibiotics Swelling and Rash  ? Sulfasalazine Rash and Swelling  ? Tramadol Other (See Comments)  ?  Rapid heart rate  ? Ciprofloxacin   ?  Muscular pain  ? Trimethoprim   ?  rash  ? Isovue [Iopamidol] Hives  ?  Pt had sneezing, itchy throat, a couple of hives and swollen, itchy left eye. Pt was given 50 mg po benadryl, and water.  Dr Carlis Abbott checked pt.  We observed pt for 30 mins w/ 5 minute BP checks.  Pt left w/o complication.  Pt will need full premeds in the future.  J Bohm, RTRCT  ?  ? ?Outpatient Medications Prior to Visit  ?Medication Sig Dispense Refill  ? acetaminophen (TYLENOL) 650 MG CR tablet Take 650 mg by mouth [place] 8 (eight) hours as needed for pain.    ? albuterol (VENTOLIN HFA) 108 (90 Base) MCG/ACT inhaler Inhale 2 puffs into the lungs [place] 4 (four) hours as needed for wheezing or shortness of breath. 1 each 2  ? ALPRAZolam (XANAX) 0.5 MG tablet Take 1 tablet (0.5 mg total) by mouth at bedtime. 90 tablet 1  ? atorvastatin (LIPITOR) 20 MG tablet Take 1 tablet (20 mg total) by mouth at bedtime. 90 tablet 3  ? cetirizine (ZYRTEC) 10 MG tablet TAKE 1 TABLET DAILY 90 tablet 3  ? Cholecalciferol (VITAMIN D) 2000 UNITS CAPS Take 2,000 Units by mouth daily.     ? ciclopirox (PENLAC) 8 % solution Apply topically at bedtime. Apply over nail and surrounding skin. Apply daily over previous coat. After seven (7) days, may remove  with alcohol and continue cycle. 6.6 mL 0  ? famotidine (PEPCID) 40 MG tablet Take 40 mg by mouth 2 (two) times daily.    ? fluticasone-salmeterol (ADVAIR HFA) 115-21 MCG/ACT inhaler Inhale 2 puffs into the lungs

## 2021-12-20 DIAGNOSIS — Z1231 Encounter for screening mammogram for malignant neoplasm of breast: Secondary | ICD-10-CM | POA: Diagnosis not present

## 2021-12-20 LAB — HM MAMMOGRAPHY

## 2021-12-20 NOTE — Progress Notes (Deleted)
?Cardiology Office Note:   ? ?Date:  12/20/2021  ? ?ID:  Erin West, DOB 1951/06/11, MRN [id number] ? ?PCP:  Leamon Arnt, MD ?[place] Cardiologist: [name], MD  ? ?Reason for visit: 1 month follow-up ? ?History of Present Illness:   ? ?Erin West is a 71 y.o. female with a hx of palpitations, coronary artery calcification by CT,  HTN, HLD.  Nuclear study July 2011 with normal LVEF and no ischemic. Event monitor July 2011 with NSR. Echo 05/2018 normal LVEF, mild diastolic dysfunction. Monitor 06/2018 with NSR with PVC's.  ?  ?She was prescribed diltiazem for palpitations in July 2022 but stopped taking it after feeling drained.  I last saw her in January 2023 and she was doing well.  Her palpitations are brief and rare.  She has continued water aerobics 3-4 times a week at [employer]. ? She states she has a chronic cough with her bad sinus drainage.  On 12/19/21, she saw her pulmonologist who thought her cough was mainly secondary from symptomatic reflux but recommended she stop losartan and see if her cough improved.   ? ?She contacted the office on November 14, 2021 with palpitations following COVID that she had at the end of January.  She then saw Laurann Montana on February 28 mention daily palpitations with shortness of breath.  Zio patch placed - worn for 6 days.  Zio showed min HR of 66 bpm, max HR of 136 bpm, and avg HR of 88 bpm. Predominant underlying rhythm was Sinus Rhythm. 1 run of Supraventricular Tachycardia occurred lasting 4 beats with a max rate of 132 bpm (avg 129 bpm). Isolated SVEs were rare  ?(<1.0%), SVE Triplets were rare (<1.0%), and no SVE Couplets were present. Isolated VEs were rare (<1.0%), and no VE Couplets or VE Triplets were present.  No med changes made. ? ?Today, *** ? ?Palpitations, rare & brief ?-7 day Zio patch 05/2021: NSR, avg HR 75, 2 short runs of NSVT, <1% PACs/PVCs ?-6 day Zio patch 10/2021: NSR, occasional PVC.  Symptoms associated with normal sinus  rhythm and normal sinus rhythm with PVC. ?-Did not tolerate diltiazem secondary to fatigue ?-Continue Coreg 25 mg twice daily ?-*** ?  ?Hypertension, well controlled ?-*** ? ?-Continue current medications.  I do not think her cough is related to losartan but instead her allergies.  If she ever wanted to switch medications in the future, we could consider amlodipine.   ?-Recommend DASH diet (high in vegetables, fruits, low-fat dairy products, whole grains, poultry, fish, and nuts and low in sweets, sugar-sweetened beverages, and red meats), salt restriction and increase physical activity. ?  ?Hyperlipidemia, well controlled ?-Lipids February 2023 with LDL 69, HDL 71, triglycerides 54. ?-Goal LDL less than 100.  Continue Lipitor.  ?  ?Disposition - Follow-up in ***. ? ?  ?Past Medical History:  ?Diagnosis Date  ? Anxiety   ? Arthritis   ? Aspiration pneumonia ([place]) 2013  ? Atypical chest pain   ? Stress test 04/21/10 - post-stress EF=89%. Normal scan.  ? Diarrhea   ? Diastolic dysfunction   ? But normal LV function on ECHO 2011 and low risk Myoview in 2011  ? DVT (deep venous thrombosis) ([place])   ? Dyslipidemia   ? GERD (gastroesophageal reflux disease)   ? Hypertension   ? Osteoarthritis   ? Osteopenia   ? Palpitations   ? Biowatch MCT Monitor 04/16/10-04/22/10   ? Syncope   ? Pain-mediated syncope  ? ? ?  Past Surgical History:  ?Procedure Laterality Date  ? CHOLECYSTECTOMY  2006  ? KNEE ARTHROSCOPY Right   ? REPLACEMENT TOTAL KNEE Left   ? left  ? REPLACEMENT TOTAL KNEE Right   ? TUBAL LIGATION  06/1978  ? ? ?Current Medications: ?No outpatient medications have been marked as taking for the 12/21/21 encounter (Appointment) with Warren Lacy, PA-C.  ?  ? ?Allergies:   Penicillins, Sulfa antibiotics, Sulfasalazine, Tramadol, Ciprofloxacin, Trimethoprim, and Isovue [iopamidol]  ? ?Social History  ? ?Socioeconomic History  ? Marital status: Widowed  ?  Spouse name: Not on file  ? Number of children: 3  ? Years of  education: Not on file  ? Highest education level: Not on file  ?Occupational History  ? Not on file  ?Tobacco Use  ? Smoking status: Never  ? Smokeless tobacco: Never  ?Vaping Use  ? Vaping Use: Never used  ?Substance and Sexual Activity  ? Alcohol use: Yes  ?  Alcohol/week: 0.0 standard drinks  ?  Comment: 2-3/wine per month  ? Drug use: No  ? Sexual activity: Not Currently  ?  Comment: 1st intercourse 71 yo-Fewer than 5 partners  ?Other Topics Concern  ? Not on file  ?Social History Narrative  ? Not on file  ? ?Social Determinants of Health  ? ?Financial Resource Strain: Not on file  ?Food Insecurity: Not on file  ?Transportation Needs: Not on file  ?Physical Activity: Not on file  ?Stress: Not on file  ?Social Connections: Not on file  ?  ? ?Family History: ?The patient's family history includes Arthritis in her father and mother; Heart disease in her mother; Mental illness in her father; Osteoporosis in her mother; Other in her mother; Stroke in her paternal grandfather; Sudden death in her father; Thyroid disease in her father. ? ?ROS:   ?Please see the history of present illness.    ? ?EKGs/Labs/Other Studies Reviewed:   ? ?EKG:  The ekg ordered today demonstrates *** ? ?Recent Labs: ?11/21/2021: ALT 16; BUN 18; Creatinine, Ser 0.87; Hemoglobin 13.8; Magnesium 2.4; Platelets 197; Potassium 4.8; Sodium 141; TSH 2.220  ? ?Recent Lipid Panel ?Lab Results  ?Component Value Date/Time  ? CHOL 151 11/21/2021 01:01 PM  ? TRIG 54 11/21/2021 01:01 PM  ? HDL 71 11/21/2021 01:01 PM  ? LDLCALC 69 11/21/2021 01:01 PM  ? ? ?Physical Exam:   ? ?VS:  LMP 09/24/2012 (Approximate)    ?No data found. ? ?Wt Readings from Last 3 Encounters:  ?12/19/21 194 lb 9.6 oz (88.3 kg)  ?12/05/21 195 lb 9.6 oz (88.7 kg)  ?11/21/21 195 lb 4.8 oz (88.6 kg)  ?  ? ?GEN: *** Well nourished, well developed in no acute distress ?HEENT: Normal ?NECK: No JVD; No carotid bruits ?CARDIAC: ***RRR, no murmurs, rubs, gallops ?RESPIRATORY:  Clear to  auscultation without rales, wheezing or rhonchi  ?ABDOMEN: Soft, non-tender, non-distended ?MUSCULOSKELETAL: No edema; No deformity  ?SKIN: Warm and dry ?NEUROLOGIC:  Alert and oriented ?PSYCHIATRIC:  Normal affect  ? ?  ?ASSESSMENT AND PLAN  ? ?*** ? ? ?{Are you ordering a CV Procedure (e.g. stress test, cath, DCCV, TEE, etc)?   Press F2        :[id number]}  ? ? ?Medication Adjustments/Labs and Tests Ordered: ?Current medicines are reviewed at length with the patient today.  Concerns regarding medicines are outlined above.  ?No orders of the defined types were placed in this encounter. ? ?No orders of the defined types were placed in  this encounter. ? ? ?There are no Patient Instructions on file for this visit.  ? ?Signed, ?Warren Lacy, PA-C  ?12/20/2021 12:15 PM    ?[place] ?

## 2021-12-20 NOTE — Telephone Encounter (Signed)
She states BP is fluctuating. Her Cardiologist indicted that this may be the reason for her cough, and recommended going off.  ?

## 2021-12-21 ENCOUNTER — Ambulatory Visit: Payer: Medicare Other | Admitting: Physician Assistant

## 2021-12-21 DIAGNOSIS — E782 Mixed hyperlipidemia: Secondary | ICD-10-CM

## 2021-12-21 DIAGNOSIS — R002 Palpitations: Secondary | ICD-10-CM

## 2021-12-21 DIAGNOSIS — I10 Essential (primary) hypertension: Secondary | ICD-10-CM

## 2021-12-23 DIAGNOSIS — Z20822 Contact with and (suspected) exposure to covid-19: Secondary | ICD-10-CM | POA: Diagnosis not present

## 2021-12-26 ENCOUNTER — Ambulatory Visit (INDEPENDENT_AMBULATORY_CARE_PROVIDER_SITE_OTHER): Payer: Medicare Other

## 2021-12-26 DIAGNOSIS — Z0000 Encounter for general adult medical examination without abnormal findings: Secondary | ICD-10-CM

## 2021-12-26 NOTE — Progress Notes (Signed)
Virtual Visit via Telephone Note ? ?I connected with  Erin West on 12/26/21 at 12:00 PM EDT by telephone and verified that I am speaking with the correct person using two identifiers. ? ?Medicare Annual Wellness visit completed telephonically due to Covid-19 pandemic.  ? ?Persons participating in this call: This Health Coach and this patient.  ? ?Location: ?Patient: home ?Provider: office ?  ?I discussed the limitations, risks, security and privacy concerns of performing an evaluation and management service by telephone and the availability of in person appointments. The patient expressed understanding and agreed to proceed. ? ?Unable to perform video visit due to video visit attempted and failed and/or patient does not have video capability.  ? ?Some vital signs may be absent or patient reported.  ? ?Willette Brace, LPN ? ? ?Subjective:  ? Erin West is a 71 y.o. female who presents for Medicare Annual (Subsequent) preventive examination. ? ?Review of Systems    ? ?Cardiac Risk Factors include: advanced age (>59mn, >>17women);hypertension;obesity (BMI >30kg/m2) ? ?   ?Objective:  ?  ?There were no vitals filed for this visit. ?There is no height or weight on file to calculate BMI. ? ? ?  04/03/2021  ?  1:12 PM 10/29/2019  ?  3:07 PM 08/23/2019  ?  8:25 PM 10/22/2018  ?  8:35 AM 02/01/2016  ? 10:26 AM 02/22/2012  ?  8:26 PM  ?Advanced Directives  ?Does Patient Have a Medical Advance Directive? Yes Yes Yes Yes Yes Patient has advance directive, copy not in chart  ?Type of Advance Directive Healthcare Power of Attorney Living will;Healthcare Power of [place] will [place] will Living will [place]Living will  ?Does patient want to make changes to medical advance directive? No - Patient declined No - Patient declined      ?Copy of HGraingerin Chart?  No - copy requested No - copy requested No - copy requested   Copy requested from family  ?Would patient like information on creating a medical advance directive?   No - Patient declined     ? ? ?Current Medications (verified) ?Outpatient Encounter Medications as of 12/26/2021  ?Medication Sig  ? acetaminophen (TYLENOL) 650 MG CR tablet Take 650 mg by mouth every 8 (eight) hours as needed for pain.  ? albuterol (VENTOLIN HFA) 108 (90 Base) MCG/ACT inhaler Inhale 2 puffs into the lungs every 4 (four) hours as needed for wheezing or shortness of breath.  ? ALPRAZolam (XANAX) 0.5 MG tablet Take 1 tablet (0.5 mg total) by mouth at bedtime.  ? atorvastatin (LIPITOR) 20 MG tablet Take 1 tablet (20 mg total) by mouth at bedtime.  ? carvedilol (COREG) 25 MG tablet Take 1 tablet (25 mg total) by mouth 2 (two) times daily.  ? cetirizine (ZYRTEC) 10 MG tablet TAKE 1 TABLET DAILY  ? Cholecalciferol (VITAMIN D) 2000 UNITS CAPS Take 2,000 Units by mouth daily.   ? ciclopirox (PENLAC) 8 % solution Apply topically at bedtime. Apply over nail and surrounding skin. Apply daily over previous coat. After seven (7) days, may remove with alcohol and continue cycle.  ? famotidine (PEPCID) 40 MG tablet Take 40 mg by mouth 2 (two) times daily.  ? fluticasone (FLONASE) 50 MCG/ACT nasal spray Place 2 sprays into both nostrils daily.  ? fluticasone-salmeterol (ADVAIR HFA) 115-21 MCG/ACT inhaler Inhale 2 puffs into the lungs 2 (two) times daily.  ? ipratropium (ATROVENT) 0.03 %  nasal spray Place 2 sprays into both nostrils 2 (two) times daily as needed for rhinitis.  ? montelukast (SINGULAIR) 10 MG tablet Take 1 tablet (10 mg total) by mouth at bedtime.  ? Multiple Vitamin (MULTIVITAMIN) tablet Take 1 tablet by mouth daily.  ? pantoprazole (PROTONIX) 40 MG tablet Take 40 mg by mouth daily.  ? polyethylene glycol powder (GLYCOLAX/MIRALAX) powder Take 17 g by mouth daily as needed.  ? Probiotic Product (PROBIOTIC PO) Take 1 capsule by mouth daily.   ? solifenacin (VESICARE) 5 MG tablet Take 5 mg by mouth  daily.  ? losartan (COZAAR) 25 MG tablet Take 1 tablet (25 mg total) by mouth daily. (Patient not taking: Reported on 12/26/2021)  ? ?No facility-administered encounter medications on file as of 12/26/2021.  ? ? ?Allergies (verified) ?Penicillins, Sulfa antibiotics, Sulfasalazine, Tramadol, Ciprofloxacin, Trimethoprim, and Isovue [iopamidol]  ? ?History: ?Past Medical History:  ?Diagnosis Date  ? Anxiety   ? Arthritis   ? Aspiration pneumonia ([place]) 2013  ? Atypical chest pain   ? Stress test 04/21/10 - post-stress EF=89%. Normal scan.  ? Diarrhea   ? Diastolic dysfunction   ? But normal LV function on ECHO 2011 and low risk Myoview in 2011  ? DVT (deep venous thrombosis) ([place])   ? Dyslipidemia   ? GERD (gastroesophageal reflux disease)   ? Hypertension   ? Osteoarthritis   ? Osteopenia   ? Palpitations   ? Biowatch MCT Monitor 04/16/10-04/22/10   ? Syncope   ? Pain-mediated syncope  ? ?Past Surgical History:  ?Procedure Laterality Date  ? CHOLECYSTECTOMY  2006  ? KNEE ARTHROSCOPY Right   ? REPLACEMENT TOTAL KNEE Left   ? left  ? REPLACEMENT TOTAL KNEE Right   ? TUBAL LIGATION  06/1978  ? ?Family History  ?Problem Relation Age of Onset  ? Heart disease Mother   ? Other Mother   ?     leg amputation  ? Arthritis Mother   ? Osteoporosis Mother   ? Mental illness Father   ? Sudden death Father   ? Arthritis Father   ? Thyroid disease Father   ? Stroke Paternal Grandfather   ? ?Social History  ? ?Socioeconomic History  ? Marital status: Widowed  ?  Spouse name: Not on file  ? Number of children: 3  ? Years of education: Not on file  ? Highest education level: Not on file  ?Occupational History  ? Not on file  ?Tobacco Use  ? Smoking status: Never  ? Smokeless tobacco: Never  ?Vaping Use  ? Vaping Use: Never used  ?Substance and Sexual Activity  ? Alcohol use: Yes  ?  Alcohol/week: 0.0 standard drinks  ?  Comment: 2-3/wine per month  ? Drug use: No  ? Sexual activity: Not Currently  ?  Comment: 1st intercourse 71 yo-Fewer than 5  partners  ?Other Topics Concern  ? Not on file  ?Social History Narrative  ? Not on file  ? ?Social Determinants of Health  ? ?Financial Resource Strain: Low Risk   ? Difficulty of Paying Living Expenses: Not hard at all  ?Food Insecurity: No Food Insecurity  ? Worried About Charity fundraiser in the Last Year: Never true  ? Ran Out of Food in the Last Year: Never true  ?Transportation Needs: No Transportation Needs  ? Lack of Transportation (Medical): No  ? Lack of Transportation (Non-Medical): No  ?Physical Activity: Sufficiently Active  ? Days of Exercise per Week: 4  days  ? Minutes of Exercise per Session: 60 min  ?Stress: No Stress Concern Present  ? Feeling of Stress : Only a little  ?Social Connections: Moderately Isolated  ? Frequency of Communication with Friends and Family: More than three times a week  ? Frequency of Social Gatherings with Friends and Family: More than three times a week  ? Attends Religious Services: 1 to 4 times per year  ? Active Member of Clubs or Organizations: No  ? Attends Archivist Meetings: Never  ? Marital Status: Widowed  ? ? ?Tobacco Counseling ?Counseling given: Not Answered ? ? ?Clinical Intake: ? ?Pre-visit preparation completed: Yes ? ?Pain : No/denies pain ? ?  ? ?BMI - recorded: 34.48 ?Nutritional Risks: None ?Diabetes: No ? ?How often do you need to have someone help you when you read instructions, pamphlets, or other written materials from your doctor or pharmacy?: 1 - Never ? ?Diabetic?no ? ?Interpreter Needed?: No ? ?Information entered by :: Charlott Rakes, LPN ? ? ?Activities of Daily Living ? ?  12/26/2021  ? 12:10 PM  ?In your present state of health, do you have any difficulty performing the following activities:  ?Hearing? 0  ?Vision? 0  ?Difficulty concentrating or making decisions? 0  ?Walking or climbing stairs? 0  ?Dressing or bathing? 0  ?Doing errands, shopping? 0  ?Preparing Food and eating ? N  ?Using the Toilet? N  ?In the past six months,  have you accidently leaked urine? N  ?Do you have problems with loss of bowel control? N  ?Managing your Medications? N  ?Managing your Finances? N  ?Housekeeping or managing your Housekeeping? N  ? ? ?Pa

## 2021-12-26 NOTE — Patient Instructions (Signed)
[name] , ?Thank you for taking time to come for your Medicare Wellness Visit. I appreciate your ongoing commitment to your health goals. Please review the following plan we discussed and let me know if I can assist you in the future.  ? ?Screening recommendations/referrals: ?Colonoscopy: Done 08/03/16 repeat every 10 years  ?Mammogram: Done 3.23/22 repeat every year  ?Bone Density: Done 12/14/20 repeat every 3 years  ?Recommended yearly ophthalmology/optometry visit for glaucoma screening and checkup ?Recommended yearly dental visit for hygiene and checkup ? ?Vaccinations: ?Influenza vaccine: Done 07/15/21 repeat every year  ?Pneumococcal vaccine: Up to date ?Tdap vaccine: Done 12/19/15 repeat every 10 years ?Shingles vaccine: Completed 08/09/17, 11/06/17   ?Covid-19:Completed 2/5, 3/2, /29/21 & 07/15/21 ? ?Advanced directives: Please bring a copy of your health care power of attorney and living will to the office at your convenience. ? ? ?Conditions/risks identified: lose weight  ? ?Next appointment: Follow up in one year for your annual wellness visit  ? ? ?Preventive Care 13 Years and Older, Female ?Preventive care refers to lifestyle choices and visits with your health care provider that can promote health and wellness. ?What does preventive care include? ?A yearly physical exam. This is also called an annual well check. ?Dental exams once or twice a year. ?Routine eye exams. Ask your health care provider how often you should have your eyes checked. ?Personal lifestyle choices, including: ?Daily care of your teeth and gums. ?Regular physical activity. ?Eating a healthy diet. ?Avoiding tobacco and drug use. ?Limiting alcohol use. ?Practicing safe sex. ?Taking low-dose aspirin every day. ?Taking vitamin and mineral supplements as recommended by your health care provider. ?What happens during an annual well check? ?The services and screenings done by your health care provider during your annual well check will  depend on your age, overall health, lifestyle risk factors, and family history of disease. ?Counseling  ?Your health care provider may ask you questions about your: ?Alcohol use. ?Tobacco use. ?Drug use. ?Emotional well-being. ?Home and relationship well-being. ?Sexual activity. ?Eating habits. ?History of falls. ?Memory and ability to understand (cognition). ?Work and work Statistician. ?Reproductive health. ?Screening  ?You may have the following tests or measurements: ?Height, weight, and BMI. ?Blood pressure. ?Lipid and cholesterol levels. These may be checked every 5 years, or more frequently if you are over 68 years old. ?Skin check. ?Lung cancer screening. You may have this screening every year starting at age 71 if you have a 30-pack-year history of smoking and currently smoke or have quit within the past 15 years. ?Fecal occult blood test (FOBT) of the stool. You may have this test every year starting at age 30. ?Flexible sigmoidoscopy or colonoscopy. You may have a sigmoidoscopy every 5 years or a colonoscopy every 10 years starting at age 30. ?Hepatitis C blood test. ?Hepatitis B blood test. ?Sexually transmitted disease (STD) testing. ?Diabetes screening. This is done by checking your blood sugar (glucose) after you have not eaten for a while (fasting). You may have this done every 1-3 years. ?Bone density scan. This is done to screen for osteoporosis. You may have this done starting at age 62. ?Mammogram. This may be done every 1-2 years. Talk to your health care provider about how often you should have regular mammograms. ?Talk with your health care provider about your test results, treatment options, and if necessary, the need for more tests. ?Vaccines  ?Your health care provider may recommend certain vaccines, such as: ?Influenza vaccine. This is recommended every year. ?Tetanus, diphtheria, and  acellular pertussis (Tdap, Td) vaccine. You may need a Td booster every 10 years. ?Zoster vaccine. You may  need this after age 51. ?Pneumococcal 13-valent conjugate (PCV13) vaccine. One dose is recommended after age 73. ?Pneumococcal polysaccharide (PPSV23) vaccine. One dose is recommended after age 61. ?Talk to your health care provider about which screenings and vaccines you need and how often you need them. ?This information is not intended to replace advice given to you by your health care provider. Make sure you discuss any questions you have with your health care provider. ?Document Released: 10/07/2015 Document Revised: 05/30/2016 Document Reviewed: 07/12/2015 ?Elsevier Interactive Patient Education ? 2017 [employer]. ? ?Fall Prevention in the Home ?Falls can cause injuries. They can happen to people of all ages. There are many things you can do to make your home safe and to help prevent falls. ?What can I do on the outside of my home? ?Regularly fix the edges of walkways and driveways and fix any cracks. ?Remove anything that might make you trip as you walk through a door, such as a raised step or threshold. ?Trim any bushes or trees on the path to your home. ?Use bright outdoor lighting. ?Clear any walking paths of anything that might make someone trip, such as rocks or tools. ?Regularly check to see if handrails are loose or broken. Make sure that both sides of any steps have handrails. ?Any raised decks and porches should have guardrails on the edges. ?Have any leaves, snow, or ice cleared regularly. ?Use sand or salt on walking paths during winter. ?Clean up any spills in your garage right away. This includes oil or grease spills. ?What can I do in the bathroom? ?Use night lights. ?Install grab bars by the toilet and in the tub and shower. Do not use towel bars as grab bars. ?Use non-skid mats or decals in the tub or shower. ?If you need to sit down in the shower, use a plastic, non-slip stool. ?Keep the floor dry. Clean up any water that spills on the floor as soon as it happens. ?Remove soap buildup in  the tub or shower regularly. ?Attach bath mats securely with double-sided non-slip rug tape. ?Do not have throw rugs and other things on the floor that can make you trip. ?What can I do in the bedroom? ?Use night lights. ?Make sure that you have a light by your bed that is easy to reach. ?Do not use any sheets or blankets that are too big for your bed. They should not hang down onto the floor. ?Have a firm chair that has side arms. You can use this for support while you get dressed. ?Do not have throw rugs and other things on the floor that can make you trip. ?What can I do in the kitchen? ?Clean up any spills right away. ?Avoid walking on wet floors. ?Keep items that you use a lot in easy-to-reach places. ?If you need to reach something above you, use a strong step stool that has a grab bar. ?Keep electrical cords out of the way. ?Do not use floor polish or wax that makes floors slippery. If you must use wax, use non-skid floor wax. ?Do not have throw rugs and other things on the floor that can make you trip. ?What can I do with my stairs? ?Do not leave any items on the stairs. ?Make sure that there are handrails on both sides of the stairs and use them. Fix handrails that are broken or loose. Make sure that  handrails are as long as the stairways. ?Check any carpeting to make sure that it is firmly attached to the stairs. Fix any carpet that is loose or worn. ?Avoid having throw rugs at the top or bottom of the stairs. If you do have throw rugs, attach them to the floor with carpet tape. ?Make sure that you have a light switch at the top of the stairs and the bottom of the stairs. If you do not have them, ask someone to add them for you. ?What else can I do to help prevent falls? ?Wear shoes that: ?Do not have high heels. ?Have rubber bottoms. ?Are comfortable and fit you well. ?Are closed at the toe. Do not wear sandals. ?If you use a stepladder: ?Make sure that it is fully opened. Do not climb a closed  stepladder. ?Make sure that both sides of the stepladder are locked into place. ?Ask someone to hold it for you, if possible. ?Clearly mark and make sure that you can see: ?Any grab bars or handrails. ?First and last

## 2021-12-28 ENCOUNTER — Encounter: Payer: Self-pay | Admitting: Family Medicine

## 2021-12-31 ENCOUNTER — Other Ambulatory Visit: Payer: Self-pay | Admitting: Physician Assistant

## 2022-01-02 ENCOUNTER — Encounter: Payer: Self-pay | Admitting: Pulmonary Disease

## 2022-01-08 ENCOUNTER — Telehealth: Payer: Self-pay | Admitting: Family Medicine

## 2022-01-08 NOTE — Telephone Encounter (Signed)
PT states she missed a call from [facility]. Please call back, as needed. ?

## 2022-01-10 ENCOUNTER — Ambulatory Visit: Payer: Medicare Other | Admitting: Family Medicine

## 2022-01-12 ENCOUNTER — Encounter: Payer: Self-pay | Admitting: Family Medicine

## 2022-01-12 ENCOUNTER — Ambulatory Visit (INDEPENDENT_AMBULATORY_CARE_PROVIDER_SITE_OTHER): Payer: Medicare Other | Admitting: Family Medicine

## 2022-01-12 VITALS — BP 142/92 | HR 70 | Temp 97.6°F | Ht 63.0 in | Wt 198.8 lb

## 2022-01-12 DIAGNOSIS — R300 Dysuria: Secondary | ICD-10-CM | POA: Diagnosis not present

## 2022-01-12 LAB — POCT URINALYSIS DIPSTICK
Bilirubin, UA: POSITIVE
Blood, UA: POSITIVE
Glucose, UA: NEGATIVE
Ketones, UA: NEGATIVE
Nitrite, UA: POSITIVE
Protein, UA: NEGATIVE
Spec Grav, UA: 1.02 (ref 1.010–1.025)
Urobilinogen, UA: 1 E.U./dL
pH, UA: 5 (ref 5.0–8.0)

## 2022-01-12 MED ORDER — NITROFURANTOIN MONOHYD MACRO 100 MG PO CAPS: 100.0000 mg | ORAL_CAPSULE | Freq: Two times a day (BID) | ORAL | 0 refills | Status: DC

## 2022-01-12 NOTE — Patient Instructions (Signed)
It was nice to see you! ? ?You have a urinary tract infection. Please start the antibiotic. ? ?We will check a urine culture to make sure you do not have a resistant bacteria. We will call you if we need to change your medications.  ? ?Please make sure you are drinking plenty of fluids over the next few days. ? ?If your symptoms do not improve over the next 5-7 days, or if they worsen, please let us know. Please also let us know if you have worsening back pain, fevers, chills, or body aches.  ? ?Take care, ?Dr Jerline Pain  ?

## 2022-01-12 NOTE — Progress Notes (Signed)
? ?  Erin West is a 71 y.o. female who presents today for an office visit. ? ?Assessment/Plan:  ?UTI ?UA consistent with UTI.  No signs of systemic illness.  We will empirically start [place] while we await culture results.  Encouraged hydration.  Discussed reasons return to care. ? ?Lip Tingling ?No red flags.  Reassuring exam today.  May have mild facial nerve palsy related to COVID from a couple months ago.  Given the sporadic nature of her symptoms and reassuring exam today do not need to pursue any further work-up at this point.  Patient was agreeable with this plan.  We discussed reasons to return to care. ? ?  ?Subjective:  ?HPI: ? ?Patient here with UTI symptoms. Started yesterday with lower abdominal pain. This is consistent with her prior UTIs. Some dysuria as well. Some frequency.  No reported fevers or chills. ? ?She had COVID a couple months ago.  Since then she has had intermittent numbness on the left part of her lower leg.  Sometimes as a dripping sensation she will need to push back.  This occurs randomly.  No pain.  Symptoms are overall stable.  Does not have any current symptoms.  No other weakness or numbness.  No vision changes.  No nausea or vomiting. ? ?   ?  ?Objective:  ?Physical Exam: ?BP (!) 142/92 (BP Location: Right Arm)   Pulse 70   Temp 97.6 ?F (36.4 ?C) (Temporal)   Ht '5\' 3"'$  (1.6 m)   Wt 198 lb 12.8 oz (90.2 kg)   LMP 09/24/2012 (Approximate)   SpO2 95%   BMI 35.22 kg/m?   ?Gen: No acute distress, resting comfortably ?CV: Regular rate and rhythm with no murmurs appreciated ?Pulm: Normal work of breathing, clear to auscultation bilaterally with no crackles, wheezes, or rhonchi ?Neuro: Grossly normal, moves all extremities.  Sensation light touch intact grossly throughout. ?Psych: Normal affect and thought content ? ?   ? ?Algis Greenhouse. Jerline Pain, MD ?01/12/2022 12:11 PM  ?

## 2022-01-14 LAB — URINE CULTURE
MICRO NUMBER:: 13295958
SPECIMEN QUALITY:: ADEQUATE

## 2022-01-15 ENCOUNTER — Telehealth: Payer: Self-pay | Admitting: Family Medicine

## 2022-01-15 NOTE — Telephone Encounter (Signed)
Patient states pt was given antibiotics for uti - states antibiotics are draining her - stated she would like another medication-  ?

## 2022-01-15 NOTE — Progress Notes (Signed)
Please inform patient of the following: ? ?Her culture confirms UTI but the antibiotic we have her on is intermediate. With her allergies we do not have a lot of other options. If her symptoms have improved we do not need to make any changes - she should finish her course of antibiotics. IF she is having ongoing issues still then we can send in a one time dose of fosfomycin 3g. ? ?Algis Greenhouse. Jerline Pain, MD ?01/15/2022 1:31 PM  ?

## 2022-01-16 ENCOUNTER — Other Ambulatory Visit: Payer: Self-pay | Admitting: *Deleted

## 2022-01-16 DIAGNOSIS — R300 Dysuria: Secondary | ICD-10-CM

## 2022-01-16 MED ORDER — FOSFOMYCIN TROMETHAMINE 3 G PO PACK: 3.0000 g | PACK | Freq: Once | ORAL | Status: DC

## 2022-01-16 MED ORDER — FOSFOMYCIN TROMETHAMINE 3 G PO PACK
3.0000 g | PACK | Freq: Once | ORAL | 0 refills | Status: AC
Start: 2022-01-16 — End: 2022-01-16

## 2022-01-16 NOTE — Telephone Encounter (Signed)
Spoke with patient. Patient given results and recommendations. Rx was sent to the pharmacy. ?

## 2022-01-18 ENCOUNTER — Ambulatory Visit (INDEPENDENT_AMBULATORY_CARE_PROVIDER_SITE_OTHER): Payer: Medicare Other

## 2022-01-18 ENCOUNTER — Ambulatory Visit (INDEPENDENT_AMBULATORY_CARE_PROVIDER_SITE_OTHER): Payer: Medicare Other | Admitting: Adult Health

## 2022-01-18 ENCOUNTER — Encounter: Payer: Self-pay | Admitting: Adult Health

## 2022-01-18 VITALS — BP 110/80 | HR 72 | Temp 98.3°F | Ht 63.0 in | Wt 198.0 lb

## 2022-01-18 DIAGNOSIS — J45909 Unspecified asthma, uncomplicated: Secondary | ICD-10-CM | POA: Insufficient documentation

## 2022-01-18 DIAGNOSIS — J4530 Mild persistent asthma, uncomplicated: Secondary | ICD-10-CM

## 2022-01-18 DIAGNOSIS — R0602 Shortness of breath: Secondary | ICD-10-CM

## 2022-01-18 DIAGNOSIS — R042 Hemoptysis: Secondary | ICD-10-CM

## 2022-01-18 DIAGNOSIS — K219 Gastro-esophageal reflux disease without esophagitis: Secondary | ICD-10-CM

## 2022-01-18 DIAGNOSIS — I2510 Atherosclerotic heart disease of native coronary artery without angina pectoris: Secondary | ICD-10-CM

## 2022-01-18 DIAGNOSIS — R7989 Other specified abnormal findings of blood chemistry: Secondary | ICD-10-CM

## 2022-01-18 DIAGNOSIS — R051 Acute cough: Secondary | ICD-10-CM

## 2022-01-18 DIAGNOSIS — R059 Cough, unspecified: Secondary | ICD-10-CM | POA: Diagnosis not present

## 2022-01-18 LAB — BASIC METABOLIC PANEL
BUN: 20 mg/dL (ref 6–23)
CO2: 29 mEq/L (ref 19–32)
Calcium: 9.5 mg/dL (ref 8.4–10.5)
Chloride: 107 mEq/L (ref 96–112)
Creatinine, Ser: 0.86 mg/dL (ref 0.40–1.20)
GFR: 68.14 mL/min (ref >60.00)
Glucose, Bld: 91 mg/dL (ref 70–99)
Potassium: 4.2 mEq/L (ref 3.5–5.1)
Sodium: 140 mEq/L (ref 135–145)

## 2022-01-18 LAB — POCT EXHALED NITRIC OXIDE: FeNO level (ppb): 28

## 2022-01-18 LAB — D-DIMER, QUANTITATIVE: D-Dimer, Quant: 0.63 mcg/mL FEU — ABNORMAL HIGH (ref <0.50)

## 2022-01-18 LAB — BRAIN NATRIURETIC PEPTIDE: Pro B Natriuretic peptide (BNP): 96 pg/mL (ref 0.0–100.0)

## 2022-01-18 NOTE — Progress Notes (Signed)
? ?'@Patient'$  ID: Erin West, female    DOB: 1950-12-19, 71 y.o.   MRN: [id number] ? ?Chief Complaint  ?Patient presents with  ? Acute Visit  ? ? ?Referring provider: ?Leamon Arnt, MD ? ?HPI: ?71 year old female followed for chronic cough and Asthma  ? ?TEST/EVENTS :  ?CT chest October 05, 2021 shows previous groundglass nodules in the lingula have resolved.  4 mm right lower lobe nodule unchanged from 2020.  Consistent with a benign etiology.  3 mm left lower lobe nodule unchanged as well. ? ?PFT 04/2021 -Normal  ? ?01/18/2022 Acute OV : Cough /Asthma  ?Patient presents for an acute office visit.  Patient complains over the last 3 days she has had increased cough with thick mucus.  Mucus is sometimes a light pink.  Patient denies any frank hemoptysis, chest pain, fever, orthopnea, edema.  Denies any calf pain or injury.  No recent surgery.  Patient says she did have COVID-19 infection in February 2023 with a slow to resolve asthmatic bronchitic flare.  She is also had some increased allergy symptoms as well.  She denies any fever or discolored mucus.  She was treated with antibiotics and steroids.  Patient says overall she feels that her breathing has improved however now with the pink-tinged mucus she is very worried.  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  She was treated briefly with Xarelto. ?Appetite is good with no nausea vomiting or diarrhea. ?Chest x-ray today shows  ?Exhaled nitric oxide testing was slightly elevated at 28ppb  ?Patient remains on Advair twice daily.  Singulair daily. ? ? ? ? ?Allergies  ?Allergen Reactions  ? Penicillins Swelling and Rash  ?  Has patient had a PCN reaction causing immediate rash, facial/tongue/throat swelling, SOB or lightheadedness with hypotension: No ?Has patient had a PCN reaction causing severe rash involving mucus membranes or skin necrosis: No ?Has patient had a PCN reaction that required hospitalization unknown ?Has  patient had a PCN reaction occurring within the last 10 years: No ?If all of the above answers are "NO", then may proceed with Cephalosporin use. ?  ? Sulfa Antibiotics Swelling and Rash  ? Sulfasalazine Rash and Swelling  ? Tramadol Other (See Comments)  ?  Rapid heart rate  ? Ciprofloxacin   ?  Muscular pain  ? Trimethoprim   ?  rash  ? Isovue [Iopamidol] Hives  ?  Pt had sneezing, itchy throat, a couple of hives and swollen, itchy left eye. Pt was given 50 mg po benadryl, and water.  Dr Carlis Abbott checked pt.  We observed pt for 30 mins w/ 5 minute BP checks.  Pt left w/o complication.  Pt will need full premeds in the future.  Alfonse Alpers, RTRCT  ? ? ?Immunization History  ?Administered Date(s) Administered  ? Fluad Quad(high Dose 65+) 07/15/2021  ? Influenza Split 06/24/2013  ? Influenza, High Dose Seasonal PF 05/23/2016, 06/28/2017, 06/12/2018, 05/17/2019  ? Influenza, Seasonal, Injecte, Preservative Fre 05/27/2014, 07/11/2015  ? Influenza,inj,quad, With Preservative 06/25/2019  ? PFIZER(Purple Top)SARS-COV-2 Vaccination 10/30/2019, 11/24/2019, 06/22/2020, 12/28/2020  ? Pension scheme manager 59yr & up 07/15/2021  ? Pneumococcal Conjugate-13 10/29/2016  ? Pneumococcal Polysaccharide-23 07/25/2012, 11/06/2017, 05/17/2019  ? Tdap 09/24/2010, 12/19/2015  ? Zoster Recombinat (Shingrix) 08/09/2017, 11/06/2017  ? Zoster, Live 09/25/2011  ? ? ?Past Medical History:  ?Diagnosis Date  ? Anxiety   ? Arthritis   ? [place] pneumonia [place]  ? Atypical chest  pain   ? Stress test 04/21/10 - post-stress EF=89%. Normal scan.  ? Diarrhea   ? Diastolic dysfunction   ? But normal LV function on ECHO 2011 and low risk Myoview in 2011  ? DVT (deep venous thrombosis) ([place])   ? Dyslipidemia   ? GERD (gastroesophageal reflux disease)   ? Hypertension   ? Osteoarthritis   ? Osteopenia   ? Palpitations   ? Bi[employer]atch MCT Monitor 04/16/10-04/22/10   ? Syncope   ? Pain-mediated syncope  ? ? ?Tobacco History: ?Social History   ? ?Tobacco Use  ?Smoking Status Never  ?Smokeless Tobacco Never  ? ?Counseling given: Not Answered ? ? ?Outpatient Medications Prior to Visit  ?Medication Sig Dispense Refill  ? acetaminophen (TYLENOL) 650 MG CR tablet Take 650 mg by mouth West 8 (eight) hours as needed for pain.    ? albuterol (VENTOLIN HFA) 108 (90 Base) MCG/ACT inhaler Inhale 2 puffs into the lungs West 4 (four) hours as needed for wheezing or shortness of breath. 1 each 2  ? ALPRAZolam (XANAX) 0.5 MG tablet Take 1 tablet (0.5 mg total) by mouth at bedtime. 90 tablet 1  ? atorvastatin (LIPITOR) 20 MG tablet Take 1 tablet (20 mg total) by mouth at bedtime. 90 tablet 3  ? carvedilol (COREG) 25 MG tablet TAKE 1 TABLET(25 MG) BY MOUTH TWICE DAILY 180 tablet 3  ? cetirizine (ZYRTEC) 10 MG tablet TAKE 1 TABLET DAILY 90 tablet 3  ? Cholecalciferol (VITAMIN D) 2000 UNITS CAPS Take 2,000 Units by mouth daily.     ? ciclopirox (PENLAC) 8 % solution Apply topically at bedtime. Apply over nail and surrounding skin. Apply daily over previous coat. After seven (7) days, may remove with alcohol and continue cycle. 6.6 mL 0  ? famotidine (PEPCID) 40 MG tablet Take 40 mg by mouth 2 (two) times daily.    ? fluticasone (FLONASE) 50 MCG/ACT nasal spray Place 2 sprays into both nostrils daily. 16 g 2  ? fluticasone-salmeterol (ADVAIR HFA) 115-21 MCG/ACT inhaler Inhale 2 puffs into the lungs 2 (two) times daily. 36 g 3  ? ipratropium (ATROVENT) 0.03 % nasal spray Place 2 sprays into both nostrils 2 (two) times daily as needed for rhinitis. 30 mL 12  ? losartan (COZAAR) 25 MG tablet Take 1 tablet (25 mg total) by mouth daily. 90 tablet 3  ? montelukast (SINGULAIR) 10 MG tablet Take 1 tablet (10 mg total) by mouth at bedtime. 90 tablet 3  ? Multiple Vitamin (MULTIVITAMIN) tablet Take 1 tablet by mouth daily.    ? pantoprazole (PROTONIX) 40 MG tablet Take 40 mg by mouth daily.    ? polyethylene glycol powder (GLYCOLAX/MIRALAX) powder Take 17 g by mouth daily as  needed. 3350 g 1  ? Probiotic Product (PROBIOTIC PO) Take 1 capsule by mouth daily.     ? solifenacin (VESICARE) 5 MG tablet Take 5 mg by mouth daily.    ? nitrofurantoin, macrocrystal-monohydrate, (MACROBID) 100 MG capsule Take 1 capsule (100 mg total) by mouth 2 (two) times daily. (Patient not taking: Reported on 01/18/2022) 14 capsule 0  ? ?No facility-administered medications prior to visit.  ? ? ? ?Review of Systems:  ? ?Constitutional:   No  weight loss, night sweats,  Fevers, chills,  ?+fatigue, or  lassitude. ? ?HEENT:   No headaches,  Difficulty swallowing,  Tooth/dental problems, or  Sore throat,  ?              No sneezing, itching, ear  ache, nasal congestion, post nasal drip,  ? ?CV:  No chest pain,  Orthopnea, PND, swelling in lower extremities, anasarca, dizziness, palpitations, syncope.  ? ?GI  No heartburn, indigestion, abdominal pain, nausea, vomiting, diarrhea, change in bowel habits, loss of appetite, bloody stools.  ? ?Resp: .  No chest wall deformity ? ?Skin: no rash or lesions. ? ?GU: no dysuria, change in color of urine, no urgency or frequency.  No flank pain, no hematuria  ? ?MS:  No joint pain or swelling.  No decreased range of motion.  No back pain. ? ? ? ?Physical Exam ? ?BP 110/80 (BP Location: Left Arm, Patient Position: Sitting, Cuff Size: Large)   Pulse 72   Temp 98.3 ?F (36.8 ?C) (Oral)   Ht '5\' 3"'$  (1.6 m)   Wt 198 lb (89.8 kg)   LMP 09/24/2012 (Approximate)   SpO2 98%   BMI 35.07 kg/m?  ? ?GEN: A/Ox3; pleasant , NAD, well nourished  ?  ?HEENT:  [place]/AT,  NOSE-clear, THROAT-clear, no lesions, no postnasal drip or exudate noted.  ? ?NECK:  Supple w/ fair ROM; no JVD; normal carotid impulses w/o bruits; no thyromegaly or nodules palpated; no lymphadenopathy.   ? ?RESP  Clear  P & A; w/o, wheezes/ rales/ or rhonchi. no accessory muscle use, no dullness to percussion ? ?CARD:  RRR, no m/r/g, no peripheral edema, pulses intact, no cyanosis or clubbing. ? ?GI:   Soft & nt; nml bowel  sounds; no organomegaly or masses detected.  ? ?Musco: Warm bil, no deformities or joint swelling noted.  ? ?Neuro: alert, no focal deficits noted.   ? ?Skin: Warm, no lesions or rashes ? ? ? ?Lab Results: ? ?CBC ? ? ? ?

## 2022-01-18 NOTE — Assessment & Plan Note (Signed)
Pink-tinged mucus questionable etiology.  Chest x-ray is clear.  Check D-dimer and BNP. ? ?Plan  ?Patient Instructions  ?Labs today. ? ?Continue on Advair and Singulair. ?Continue on Zyrtec daily  ?Hold Flonase and Atrovent for next 3 days .  ?Saline nasal gel At bedtime   ?Delsym 2 tsp Twice daily  for cough As needed   ? ?Follow up  in 3-4 weeks with Dr Erin Fulling and As needed   ?If pink sputum does not resolve in next week  will need sooner follow up  ?Please contact office for sooner follow up if symptoms do not improve or worsen or seek emergency care  ? ? ? ? ? ?  ? ?

## 2022-01-18 NOTE — Assessment & Plan Note (Signed)
Slow to resolve asthmatic bronchitic exacerbation.  Patient had COVID-19 in February.  Slow to resolve bronchitic symptoms with cough and chronic rhinitis. ?Now with pink-tinged mucus.  Chest x-ray today is clear with no sign of pneumonia.  Patient is at risk for VTE with previous DVT history and recent COVID-19.  We will check D-dimer.  If positive will need CTA with PE protocol ?Patient has completed antibiotics and steroids.  She has no further fever or discolored mucus.   ?Unclear etiology for pink-tinged sputum.  Will await lab work. ? ?Plan  ?Patient Instructions  ?Labs today. ? ?Continue on Advair and Singulair. ?Continue on Zyrtec daily  ?Hold Flonase and Atrovent for next 3 days .  ?Saline nasal gel At bedtime   ?Delsym 2 tsp Twice daily  for cough As needed   ? ?Follow up  in 3-4 weeks with Dr Erin Fulling and As needed   ?If pink sputum does not resolve in next week  will need sooner follow up  ?Please contact office for sooner follow up if symptoms do not improve or worsen or seek emergency care  ? ? ? ? ? ?  ? ?

## 2022-01-18 NOTE — Patient Instructions (Addendum)
Labs today. ? ?Continue on Advair and Singulair. ?Continue on Zyrtec daily  ?Hold Flonase and Atrovent for next 3 days .  ?Saline nasal gel At bedtime   ?Delsym 2 tsp Twice daily  for cough As needed   ? ?Follow up  in 3-4 weeks with Dr Erin Fulling and As needed   ?If pink sputum does not resolve in next week  will need sooner follow up  ?Please contact office for sooner follow up if symptoms do not improve or worsen or seek emergency care  ? ? ? ? ? ?

## 2022-01-18 NOTE — Assessment & Plan Note (Signed)
Continue on GERD maintenance regimen ?

## 2022-01-18 NOTE — Progress Notes (Signed)
Called and spoke with patient, provided results/recommendations per Rexene Edison NP.  Patient has to be medicated prior to receiving IV contrast and would prefer to have the VQ scan.  VQ scan ordered per Rexene Edison NP.  Patient aware that [facility] will call her tomorrow to schedule scan.  Nothing further needed.

## 2022-01-18 NOTE — Addendum Note (Signed)
Addended by: Vanessa Barbara on: 01/18/2022 05:37 PM ? ? Modules accepted: Orders ? ?

## 2022-01-19 ENCOUNTER — Ambulatory Visit (HOSPITAL_COMMUNITY)
Admission: RE | Admit: 2022-01-19 | Discharge: 2022-01-19 | Disposition: A | Discharge status: Home / Self Care | Payer: Medicare Other | Source: Ambulatory Visit | Attending: Adult Health | Admitting: Adult Health

## 2022-01-19 DIAGNOSIS — R0602 Shortness of breath: Secondary | ICD-10-CM | POA: Insufficient documentation

## 2022-01-19 DIAGNOSIS — R051 Acute cough: Secondary | ICD-10-CM | POA: Insufficient documentation

## 2022-01-19 DIAGNOSIS — R7989 Other specified abnormal findings of blood chemistry: Secondary | ICD-10-CM

## 2022-01-19 DIAGNOSIS — I517 Cardiomegaly: Secondary | ICD-10-CM | POA: Diagnosis not present

## 2022-01-19 DIAGNOSIS — R042 Hemoptysis: Secondary | ICD-10-CM

## 2022-01-19 MED ORDER — TECHNETIUM TO 99M ALBUMIN AGGREGATED
4.0000 | Freq: Once | INTRAVENOUS | Status: AC | PRN
  Administered 2022-01-19: 4 via INTRAVENOUS

## 2022-01-22 ENCOUNTER — Telehealth: Payer: Self-pay

## 2022-01-22 NOTE — Telephone Encounter (Signed)
Pt accepted 10:30 work-in. ? ?

## 2022-01-22 NOTE — Telephone Encounter (Signed)
Patient states she was diagnosised with UTI.  States she has completed medication as of Wed of last week.   States she is starting to have an urgency to go to the bathroom and having pain with urination.    Would like to know if another round of medication can be sent to the pharmacy or please advise on appointment?

## 2022-01-22 NOTE — Progress Notes (Signed)
ATC x1.  No answer.  LVM to return call.

## 2022-01-22 NOTE — Progress Notes (Signed)
Patient returned call, provided results/recommendations per Rexene Edison NP.  She verbalized understanding.  Nothing further needed.

## 2022-01-22 NOTE — Telephone Encounter (Signed)
CSR scheduled appt with another provider for morning 05/02 ?

## 2022-01-22 NOTE — Telephone Encounter (Signed)
Message sent thru MyChart on the fosomycin.  ?

## 2022-01-23 ENCOUNTER — Ambulatory Visit (INDEPENDENT_AMBULATORY_CARE_PROVIDER_SITE_OTHER): Payer: Medicare Other | Admitting: Family Medicine

## 2022-01-23 ENCOUNTER — Encounter: Payer: Self-pay | Admitting: Family Medicine

## 2022-01-23 ENCOUNTER — Ambulatory Visit: Payer: Medicare Other | Admitting: Physician Assistant

## 2022-01-23 VITALS — BP 136/90 | HR 69 | Temp 97.8°F | Ht 63.0 in | Wt 197.2 lb

## 2022-01-23 DIAGNOSIS — N3000 Acute cystitis without hematuria: Secondary | ICD-10-CM | POA: Diagnosis not present

## 2022-01-23 LAB — POCT URINALYSIS DIPSTICK
Bilirubin, UA: NEGATIVE
Blood, UA: NEGATIVE
Glucose, UA: NEGATIVE
Ketones, UA: NEGATIVE
Nitrite, UA: POSITIVE
Protein, UA: NEGATIVE
Spec Grav, UA: >=1.03 — AB (ref 1.010–1.025)
Urobilinogen, UA: 0.2 E.U./dL
pH, UA: 5.5 (ref 5.0–8.0)

## 2022-01-23 NOTE — Progress Notes (Signed)
? ?Subjective  ? ?CC:  ?Chief Complaint  ?Patient presents with  ? Urinary Tract Infection  ?  Pt here today to recheck urine.   ? ? ?HPI: Erin West is a 71 y.o. female who presents to the office today to address the problems listed above in the chief complaint. ?Patient reports dysuria and urinary frequency. Treated for uti and sxs improved but did not completely resolve. No vaginal sxs. Completed abx including single dose of fosmycin  ? ?Assessment  ?1. Acute cystitis without hematuria   ? ?  ?Plan  ?Recheck culture and retreat if indicated ? ?Follow up: prn  ?Orders Placed This Encounter  ?Procedures  ? Urine Culture  ? POCT urinalysis dipstick  ? ?No orders of the defined types were placed in this encounter. ? ?  ? ?I reviewed the patients updated PMH, FH, and SocHx.  ?  ?Patient Active Problem List  ? Diagnosis Date Noted  ? Chronic prescription benzodiazepine use 09/20/2021  ?  Priority: High  ? Depression, major, single episode, moderate ([place]) 11/02/2020  ?  Priority: High  ? Mixed hyperlipidemia 12/04/2017  ?  Priority: High  ? Chronic cough 07/24/2017  ?  Priority: High  ? Essential hypertension   ?  Priority: High  ? Osteoarthritis of left glenohumeral joint 04/19/2020  ?  Priority: Medium   ? OAB (overactive bladder) 10/28/2019  ?  Priority: Medium   ? Chronic seasonal allergic rhinitis 12/04/2017  ?  Priority: Medium   ? Chronic pansinusitis 09/09/2017  ?  Priority: Medium   ? Laryngopharyngeal reflux (LPR) 09/09/2017  ?  Priority: Medium   ? Bilateral leg edema 03/30/2014  ?  Priority: Medium   ? History of gastritis 02/10/2014  ?  Priority: Medium   ? Dysphagia 01/28/2014  ?  Priority: Medium   ? Irritable bowel syndrome (IBS) 09/08/2013  ?  Priority: Medium   ? Osteopenia 07/14/2013  ?  Priority: Medium   ? Osteoarthritis, multiple sites 07/14/2013  ?  Priority: Medium   ? Gastroesophageal reflux disease without esophagitis   ?  Priority: Medium   ? Atrophic vaginitis 10/22/2018  ?  Priority:  Low  ? Status post total right knee replacement 01/24/2016  ?  Priority: Low  ? Asthma 01/18/2022  ? Hemoptysis 01/18/2022  ? ?Current Meds  ?Medication Sig  ? acetaminophen (TYLENOL) 650 MG CR tablet Take 650 mg by mouth every 8 (eight) hours as needed for pain.  ? albuterol (VENTOLIN HFA) 108 (90 Base) MCG/ACT inhaler Inhale 2 puffs into the lungs every 4 (four) hours as needed for wheezing or shortness of breath.  ? ALPRAZolam (XANAX) 0.5 MG tablet Take 1 tablet (0.5 mg total) by mouth at bedtime.  ? atorvastatin (LIPITOR) 20 MG tablet Take 1 tablet (20 mg total) by mouth at bedtime.  ? carvedilol (COREG) 25 MG tablet TAKE 1 TABLET(25 MG) BY MOUTH TWICE DAILY  ? cetirizine (ZYRTEC) 10 MG tablet TAKE 1 TABLET DAILY  ? Cholecalciferol (VITAMIN D) 2000 UNITS CAPS Take 2,000 Units by mouth daily.   ? ciclopirox (PENLAC) 8 % solution Apply topically at bedtime. Apply over nail and surrounding skin. Apply daily over previous coat. After seven (7) days, may remove with alcohol and continue cycle.  ? famotidine (PEPCID) 40 MG tablet Take 40 mg by mouth 2 (two) times daily.  ? fluticasone (FLONASE) 50 MCG/ACT nasal spray Place 2 sprays into both nostrils daily.  ? fluticasone-salmeterol (ADVAIR HFA) 115-21 MCG/ACT inhaler Inhale  2 puffs into the lungs 2 (two) times daily.  ? ipratropium (ATROVENT) 0.03 % nasal spray Place 2 sprays into both nostrils 2 (two) times daily as needed for rhinitis.  ? losartan (COZAAR) 25 MG tablet Take 1 tablet (25 mg total) by mouth daily.  ? montelukast (SINGULAIR) 10 MG tablet Take 1 tablet (10 mg total) by mouth at bedtime.  ? Multiple Vitamin (MULTIVITAMIN) tablet Take 1 tablet by mouth daily.  ? pantoprazole (PROTONIX) 40 MG tablet Take 40 mg by mouth daily.  ? polyethylene glycol powder (GLYCOLAX/MIRALAX) powder Take 17 g by mouth daily as needed.  ? Probiotic Product (PROBIOTIC PO) Take 1 capsule by mouth daily.   ? solifenacin (VESICARE) 5 MG tablet Take 5 mg by mouth daily.   ? ? ?Review of Systems: ?Cardiovascular: negative for chest pain ?Respiratory: negative for SOB or persistent cough ?Gastrointestinal: negative for abdominal pain ?Constitutional: Negative for fever malaise or anorexia ? ?Objective  ?Vitals: Temp 97.8 ?F (36.6 ?C)   Ht '5\' 3"'$  (1.6 m)   Wt 197 lb 3.2 oz (89.4 kg)   LMP 09/24/2012 (Approximate)   BMI 34.93 kg/m?  ?General: no acute distress  ?Psych:  Alert and oriented, normal mood and affect ?Cardiovascular:  RRR without murmur or gallop. no peripheral edema ?Respiratory:  Good breath sounds bilaterally, CTAB with normal respiratory effort ?Gastrointestinal: soft, flat abdomen, normal active bowel sounds, no palpable masses, no hepatosplenomegaly, no appreciated hernias, NO CVAT, mild suprapubic ttp w/o rebound or guarding ? ? ?Commons side effects, risks, benefits, and alternatives for medications and treatment plan prescribed today were discussed, and the patient expressed understanding of the given instructions. Patient is instructed to call or message via MyChart if he/she has any questions or concerns regarding our treatment plan. No barriers to understanding were identified. We discussed Red Flag symptoms and signs in detail. Patient expressed understanding regarding what to do in case of urgent or emergency type symptoms.  ?Medication list was reconciled, printed and provided to the patient in AVS. Patient instructions and summary information was reviewed with the patient as documented in the AVS. ?This note was prepared with assistance of Systems analyst. Occasional wrong-word or sound-a-like substitutions may have occurred due to the inherent limitations of voice recognition software ? ? ? ?

## 2022-01-23 NOTE — Progress Notes (Deleted)
Cardiology Office Note:    Date:  01/23/2022   ID:  Erin West, DOB [birth year], MRN [id number]  PCP:  Leamon Arnt, MD [place] Cardiologist: [name], MD   Reason for visit: Follow-up  History of Present Illness:    Erin West is a 71 y.o. female with a hx of  RIM THATCH is a 71 y.o. female with a hx of palpitations, coronary artery calcification by CT,  HTN, HLD.  Nuclear study July 2011 with normal LVEF and no ischemic. Event monitor July 2011 with NSR. Echo 05/2018 normal LVEF, mild diastolic dysfunction. Monitor 06/2018 with NSR with PVC's.    She was prescribed diltiazem for palpitations in July 2022 but stopped taking it after feeling drained.  I last saw her in January 2023 and she was doing well.  Her palpitations are brief and rare.  She has continued water aerobics 3-4 times a week at [employer].  She states she has a chronic cough with her bad sinus drainage.  On 12/19/21, she saw her pulmonologist who thought her cough was mainly secondary from symptomatic reflux but recommended she stop losartan and see if her cough improved.    She contacted the office on November 14, 2021 with palpitations following COVID that she had at the end of January.  She then saw Laurann Montana on February 28 mention daily palpitations with shortness of breath.  Zio patch placed - worn for 6 days.  Zio showed min HR of 66 bpm, max HR of 136 bpm, and avg HR of 88 bpm. Predominant underlying rhythm was Sinus Rhythm. 1 run of Supraventricular Tachycardia occurred lasting 4 beats with a max rate of 132 bpm (avg 129 bpm). Isolated SVEs were rare (<1.0%), SVE Triplets were rare (<1.0%), and no SVE Couplets were present. Isolated VEs were rare (<1.0%), and no VE Couplets or VE Triplets were present.  No med changes made.  Today, ***  Palpitations, rare & brief -7 day Zio patch 05/2021: NSR, avg HR 75, 2 short runs of NSVT, <1% PACs/PVCs -6 day Zio patch 10/2021: NSR, occasional PVC.   Symptoms associated with normal sinus rhythm and normal sinus rhythm with PVC. -Did not tolerate diltiazem secondary to fatigue -Continue Coreg 25 mg twice daily -***   Hypertension, well controlled -***  -Continue current medications.  I do not think her cough is related to losartan but instead her allergies.  If she ever wanted to switch medications in the future, we could consider amlodipine.   -Recommend DASH diet (high in vegetables, fruits, low-fat dairy products, whole grains, poultry, fish, and nuts and low in sweets, sugar-sweetened beverages, and red meats), salt restriction and increase physical activity.   Hyperlipidemia, well controlled -Lipids February 2023 with LDL 69, HDL 71, triglycerides 54. -Goal LDL less than 100.  Continue Lipitor.    Disposition - Follow-up in ***.    Past Medical History:  Diagnosis Date   Anxiety    Arthritis    Aspiration pneumonia [place] 2013   Atypical chest pain    Stress test 04/21/10 - post-stress EF=89%. Normal scan.   Diarrhea    Diastolic dysfunction    But normal LV function on ECHO 2011 and low risk Myoview in 2011   DVT (deep venous thrombosis) (HCC)    Dyslipidemia    GERD (gastroesophageal reflux disease)    Hypertension    Osteoarthritis    Osteopenia    Palpitations    [employer] MCT Monitor 04/16/10-04/22/10  Syncope    Pain-mediated syncope    Past Surgical History:  Procedure Laterality Date   CHOLECYSTECTOMY  2006   KNEE ARTHROSCOPY Right    REPLACEMENT TOTAL KNEE Left    left   REPLACEMENT TOTAL KNEE Right    TUBAL LIGATION  06/1978    Current Medications: No outpatient medications have been marked as taking for the 01/24/22 encounter (Appointment) with Warren Lacy, PA-C.     Allergies:   Penicillins, Sulfa antibiotics, Sulfasalazine, Tramadol, Ciprofloxacin, Trimethoprim, and Isovue [iopamidol]   Social History   Socioeconomic History   Marital status: Widowed    Spouse name: Not on file    Number of children: 3   Years of education: Not on file   Highest education level: Not on file  Occupational History   Not on file  Tobacco Use   Smoking status: Never   Smokeless tobacco: Never  Vaping Use   Vaping Use: Never used  Substance and Sexual Activity   Alcohol use: Yes    Alcohol/week: 0.0 standard drinks    Comment: 2-3/wine per month   Drug use: No   Sexual activity: Not Currently    Comment: 1st intercourse 71 yo-Fewer than 5 partners  Other Topics Concern   Not on file  Social History Narrative   Not on file   Social Determinants of Health   Financial Resource Strain: Low Risk    Difficulty of Paying Living Expenses: Not hard at all  Food Insecurity: No Food Insecurity   Worried About Charity fundraiser in the Last Year: Never true   [place] in the Last Year: Never true  Transportation Needs: No Transportation Needs   Lack of Transportation (Medical): No   Lack of Transportation (Non-Medical): No  Physical Activity: Sufficiently Active   Days of Exercise per Week: 4 days   Minutes of Exercise per Session: 60 min  Stress: No Stress Concern Present   Feeling of Stress : Only a little  Social Connections: Moderately Isolated   Frequency of Communication with Friends and Family: More than three times a week   Frequency of Social Gatherings with Friends and Family: More than three times a week   Attends Religious Services: 1 to 4 times per year   Active Member of [employer] or Organizations: No   Attends Archivist Meetings: Never   Marital Status: Widowed     Family History: The patient's family history includes Arthritis in her father and mother; Heart disease in her mother; Mental illness in her father; Osteoporosis in her mother; Other in her mother; Stroke in her paternal grandfather; Sudden death in her father; Thyroid disease in her father.  ROS:   Please see the history of present illness.     EKGs/Labs/Other Studies Reviewed:     EKG:  The ekg ordered today demonstrates ***  Recent Labs: 11/21/2021: ALT 16; Hemoglobin 13.8; Magnesium 2.4; Platelets 197; TSH 2.220 01/18/2022: BUN 20; Creatinine, Ser 0.86; Potassium 4.2; Pro B Natriuretic peptide (BNP) 96.0; Sodium 140   Recent Lipid Panel Lab Results  Component Value Date/Time   CHOL 151 11/21/2021 01:01 PM   TRIG 54 11/21/2021 01:01 PM   HDL 71 11/21/2021 01:01 PM   LDLCALC 69 11/21/2021 01:01 PM    Physical Exam:    VS:  LMP 09/24/2012 (Approximate)    No data found.  Wt Readings from Last 3 Encounters:  01/23/22 197 lb 3.2 oz (89.4 kg)  01/18/22 198 lb (89.8  kg)  01/12/22 198 lb 12.8 oz (90.2 kg)     GEN: *** Well nourished, well developed in no acute distress HEENT: Normal NECK: No JVD; No carotid bruits CARDIAC: ***RRR, no murmurs, rubs, gallops RESPIRATORY:  Clear to auscultation without rales, wheezing or rhonchi  ABDOMEN: Soft, non-tender, non-distended MUSCULOSKELETAL: No edema; No deformity  SKIN: Warm and dry NEUROLOGIC:  Alert and oriented PSYCHIATRIC:  Normal affect     ASSESSMENT AND PLAN   ***   {Are you ordering a CV Procedure (e.g. stress test, cath, DCCV, TEE, etc)?   Press F2        :[id number]}    Medication Adjustments/Labs and Tests Ordered: Current medicines are reviewed at length with the patient today.  Concerns regarding medicines are outlined above.  No orders of the defined types were placed in this encounter.  No orders of the defined types were placed in this encounter.   There are no Patient Instructions on file for this visit.   Signed, Warren Lacy, PA-C  01/23/2022 10:35 PM    [facility] Date:  12/20/2021

## 2022-01-23 NOTE — Telephone Encounter (Signed)
Pt stated that she did complete the foscomycin ?

## 2022-01-24 ENCOUNTER — Ambulatory Visit: Payer: Medicare Other | Admitting: Physician Assistant

## 2022-01-24 DIAGNOSIS — R002 Palpitations: Secondary | ICD-10-CM

## 2022-01-25 ENCOUNTER — Telehealth: Payer: Self-pay | Admitting: Family Medicine

## 2022-01-25 LAB — URINE CULTURE
MICRO NUMBER:: 13339735
SPECIMEN QUALITY:: ADEQUATE

## 2022-01-25 MED ORDER — CEFIXIME 400 MG PO CAPS: 400.0000 mg | ORAL_CAPSULE | Freq: Every day | ORAL | 0 refills | Status: DC

## 2022-01-25 NOTE — Telephone Encounter (Signed)
Pt called asking about results. ?Informed pt that culture is not back yet. ?Pt still has a question and would like a call back when available. ?

## 2022-01-26 ENCOUNTER — Ambulatory Visit (INDEPENDENT_AMBULATORY_CARE_PROVIDER_SITE_OTHER): Payer: Medicare Other | Admitting: Family Medicine

## 2022-01-26 VITALS — BP 146/84 | HR 94 | Temp 98.4°F | Ht 63.0 in | Wt 193.8 lb

## 2022-01-26 DIAGNOSIS — N390 Urinary tract infection, site not specified: Secondary | ICD-10-CM

## 2022-01-26 DIAGNOSIS — N3281 Overactive bladder: Secondary | ICD-10-CM | POA: Diagnosis not present

## 2022-01-26 DIAGNOSIS — I10 Essential (primary) hypertension: Secondary | ICD-10-CM

## 2022-01-26 NOTE — Patient Instructions (Addendum)
It was nice to see you! ? ?You have a urinary tract infection. Please continue the antibiotic. ? ?We will check a urine culture to make sure you do not have a resistant bacteria. We will call you if we need to change your medications.  ? ?Please make sure you are drinking plenty of fluids over the next few days. ? ?If your symptoms do not improve over the next 5-7 days, or if they worsen, please let us know. Please also let us know if you have worsening back pain, fevers, chills, or body aches.  ? ?Take care, ?Dr Jerline Pain  ?

## 2022-01-26 NOTE — Progress Notes (Signed)
? ?  Erin West is a 71 y.o. female who presents today for an office visit. ? ?Assessment/Plan:  ?New/Acute Problems: ?UTI ?No red flags.  She still has persistent Klebsiella UTI that likely did not clear from her initial culture on 4/21.  She was started on Suprax yesterday by her PCP.  Encouraged patient to continue her current antibiotics.  We discussed her lab results and that a "positive" urine culture is marked as >100k units on her culture.  Reassured patient that she does not have a serious infection. Encouraged hydration.  She will let me or her PCP know if her symptoms are not improving with Suprax. ? ?Chronic Problems Addressed Today: ?HTN - Above goal today though normally well well-controlled.  She will continue home monitoring and let me or her PCP know persistently elevated. ?OAB-may be causing her urinary frequency as above.  She follows with urology.  This continues to be an issue despite treating her UTI she will need to follow back up with them. ? ? ?  ?Subjective:  ?HPI: ? ?Patient here with UTI symptoms.  This is going on for couple weeks.  We saw her for this twice over the last couple of weeks.  Most recent urine culture showed Klebsiella.  She was started on Suprax yesterday by her PCP.  She has taken 1 dose.  She was concerned about the results that she is on MyChart saying that she over 100,000 CFU.  Still has some urgency.  No dysuria.  No fevers or chills. ? ?   ?  ?Objective:  ?Physical Exam: ?BP (!) 146/84 (BP Location: Left Arm)   Pulse 94   Temp 98.4 ?F (36.9 ?C) (Temporal)   Ht '5\' 3"'$  (1.6 m)   Wt 193 lb 12.8 oz (87.9 kg)   LMP 09/24/2012 (Approximate)   SpO2 97%   BMI 34.33 kg/m?   ?Wt Readings from Last 3 Encounters:  ?01/26/22 193 lb 12.8 oz (87.9 kg)  ?01/23/22 197 lb 3.2 oz (89.4 kg)  ?01/18/22 198 lb (89.8 kg)  ? ? ?Gen: No acute distress, resting comfortably ?CV: Regular rate and rhythm with no murmurs appreciated ?Pulm: Normal work of breathing, clear to auscultation  bilaterally with no crackles, wheezes, or rhonchi ?Neuro: Grossly normal, moves all extremities ?Psych: Normal affect and thought content ? ?   ? ?Algis Greenhouse. Jerline Pain, MD ?01/26/2022 11:57 AM  ?

## 2022-01-29 ENCOUNTER — Ambulatory Visit: Payer: Medicare Other | Admitting: Allergy

## 2022-01-30 ENCOUNTER — Encounter: Payer: Self-pay | Admitting: Physician Assistant

## 2022-02-01 DIAGNOSIS — Z20822 Contact with and (suspected) exposure to covid-19: Secondary | ICD-10-CM | POA: Diagnosis not present

## 2022-02-07 DIAGNOSIS — D120 Benign neoplasm of cecum: Secondary | ICD-10-CM | POA: Diagnosis not present

## 2022-02-07 DIAGNOSIS — K635 Polyp of colon: Secondary | ICD-10-CM | POA: Diagnosis not present

## 2022-02-07 DIAGNOSIS — D123 Benign neoplasm of transverse colon: Secondary | ICD-10-CM | POA: Diagnosis not present

## 2022-02-07 DIAGNOSIS — K219 Gastro-esophageal reflux disease without esophagitis: Secondary | ICD-10-CM | POA: Diagnosis not present

## 2022-02-07 DIAGNOSIS — Z86010 Personal history of colonic polyps: Secondary | ICD-10-CM | POA: Diagnosis not present

## 2022-02-07 DIAGNOSIS — D175 Benign lipomatous neoplasm of intra-abdominal organs: Secondary | ICD-10-CM | POA: Diagnosis not present

## 2022-02-07 DIAGNOSIS — Z1211 Encounter for screening for malignant neoplasm of colon: Secondary | ICD-10-CM | POA: Diagnosis not present

## 2022-02-07 DIAGNOSIS — K2970 Gastritis, unspecified, without bleeding: Secondary | ICD-10-CM | POA: Diagnosis not present

## 2022-02-12 DIAGNOSIS — R052 Subacute cough: Secondary | ICD-10-CM | POA: Diagnosis not present

## 2022-02-12 DIAGNOSIS — J3089 Other allergic rhinitis: Secondary | ICD-10-CM | POA: Diagnosis not present

## 2022-02-12 DIAGNOSIS — K219 Gastro-esophageal reflux disease without esophagitis: Secondary | ICD-10-CM | POA: Diagnosis not present

## 2022-02-12 DIAGNOSIS — J301 Allergic rhinitis due to pollen: Secondary | ICD-10-CM | POA: Diagnosis not present

## 2022-02-20 ENCOUNTER — Ambulatory Visit: Payer: Medicare Other | Admitting: Pulmonary Disease

## 2022-02-21 ENCOUNTER — Ambulatory Visit (INDEPENDENT_AMBULATORY_CARE_PROVIDER_SITE_OTHER): Payer: Medicare Other | Admitting: Pulmonary Disease

## 2022-02-21 ENCOUNTER — Encounter: Payer: Self-pay | Admitting: Pulmonary Disease

## 2022-02-21 VITALS — BP 126/88 | HR 72 | Ht 63.0 in | Wt 190.0 lb

## 2022-02-21 DIAGNOSIS — R053 Chronic cough: Secondary | ICD-10-CM

## 2022-02-21 MED ORDER — AZITHROMYCIN 250 MG PO TABS: ORAL_TABLET | ORAL | 0 refills | Status: DC

## 2022-02-21 NOTE — Progress Notes (Signed)
Synopsis: Referred in June 2022 for Bronchitis by Beverley Fiedler, NP  Subjective:   PATIENT ID: Erin West GENDER: female DOB: 1951/09/22, MRN: [id number]  HPI  Chief Complaint  Patient presents with   Follow-up    3-4 wk f/u for hemoptysis. States she is still coughing up pink phlegm. Tends to cough more at night.    Erin West is a 71 year old woman, never smoker with history of GERD, hypertension and DVT who returns to pulmonary clinic for cough.   She was seen in acute visit by Rexene Edison, NP on 01/18/22 for cough with thick mucous that is occasional pink tinged. No frank hemoptysis noted. D-dimer testing was elevated so a V/Q scan was ordered which was negative for pulmonary emboli.   She had colonoscopy and EGD 2 weeks ago by Dr. Collene Mares. They removed 2 polyps during the colonoscopy. She continues to have significant GERD symptoms despite sleeping with head of bed elevated, protonix and famotidine therapy.  She is using netty pot 2-3 times per week for sinus congestion. She denies any fevers, chills or sweats. She has lost 8-9lbs since February.   OV 12/19/21 She has been experiencing shortness of breath and wheezing past 2 weeks and she reports having COVID in February of this year.  Her cough was worse when she was sick with COVID but then the cough got better for a couple of weeks but has since returned and has been bothering her.  She is still having issues with GERD despite taking PPI 30 minutes before breakfast and Pepcid twice daily along with sleeping with the head of her bed elevated.  She also reports postnasal drainage since last week and is using a Nettie pot.  She continues to take Zyrtec daily and Singulair daily.  She is using Advair 2 puffs twice daily.  She is not currently using any nasal sprays.  She was seen by ENT 05/2021 where flexible laryngoscopy showed laryngopharyngeal reflux.  OV 05/17/21 She reports improvement in her cough with starting inhaler  therapy with advair 115-76mg 2 puffs twice daily since last visit along with fluticasone and ipratropium nasal sprays for post nasal drainage. She rates her cough at a level 4 currently where is was previously an 8-9 (10 being the worst). She continues to have issues with GERD despite using pantoprazole '40mg'$  daily and elevated the head of her bed at night.   We discussed her CT Chest scan over the phone. She has multiple subcentimeter nodules that will require follow up in the future. 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   She has referral to ENT clinic for evaluation of the cough and is awaiting to schedule this visit.  OV 02/2021 She reports having 2 episodes of bronchitis per year since 2018 which include prolonged symptoms of wheezing, cough and shortness of breath.  Her most recent episode was last month where she received an albuterol inhaler, azithromycin and a course of prednisone which helped her symptoms resolved.  She reports the cough is mainly dry.  The albuterol does help somewhat with the wheezing.  She does have episodes of sinus congestion and postnasal drainage when she is acutely ill with these episodes.  She also complains of bad heartburn/reflux symptoms and does note that her reflux symptoms were increased prior to her last episodes of bronchitis.  She denies any seasonal allergy symptoms or worsening of cough or wheezing with cold air or really  hot weather.  She is currently taking pantoprazole 40 mg daily for her GERD.  This was recently increased about 2 months ago from 20 mg daily.  She mainly sleeps flat at night on her sides but is unable to lay flat on her back.  She does have a height adjustable bed frame but does not sleep with the head of the bed elevated at this time.  She does report some nasal congestion and postnasal drainage.  She is currently using fluticasone nasal spray 2 sprays per nostril daily.  Past Medical  History:  Diagnosis Date   Anxiety    Arthritis    Aspiration pneumonia ([place]) 2013   Atypical chest pain    Stress test 04/21/10 - post-stress EF=89%. Normal scan.   Diarrhea    Diastolic dysfunction    But normal LV function on ECHO 2011 and low risk Myoview in 2011   DVT (deep venous thrombosis) (HCC)    Dyslipidemia    GERD (gastroesophageal reflux disease)    Hypertension    Osteoarthritis    Osteopenia    Palpitations    [employer] MCT Monitor 04/16/10-04/22/10    Syncope    Pain-mediated syncope     Family History  Problem Relation Age of Onset   Heart disease Mother    Other Mother        leg amputation   Arthritis Mother    Osteoporosis Mother    Mental illness Father    Sudden death Father    Arthritis Father    Thyroid disease Father    Stroke Paternal Grandfather      Social History   Socioeconomic History   Marital status: Widowed    Spouse name: Not on file   Number of children: 3   Years of education: Not on file   Highest education level: Not on file  Occupational History   Not on file  Tobacco Use   Smoking status: Never   Smokeless tobacco: Never  Vaping Use   Vaping Use: Never used  Substance and Sexual Activity   Alcohol use: Yes    Alcohol/week: 0.0 standard drinks    Comment: 2-3/wine per month   Drug use: No   Sexual activity: Not Currently    Comment: 1st intercourse 71 yo-Fewer than 5 partners  Other Topics Concern   Not on file  Social History Narrative   Not on file   Social Determinants of Health   Financial Resource Strain: Low Risk    Difficulty of Paying Living Expenses: Not hard at all  Food Insecurity: No Food Insecurity   Worried About Charity fundraiser in the Last Year: Never true   Bath in the Last Year: Never true  Transportation Needs: No Transportation Needs   Lack of Transportation (Medical): No   Lack of Transportation (Non-Medical): No  Physical Activity: Sufficiently Active   Days of Exercise  per Week: 4 days   Minutes of Exercise per Session: 60 min  Stress: No Stress Concern Present   Feeling of Stress : Only a little  Social Connections: Moderately Isolated   Frequency of Communication with Friends and Family: More than three times a week   Frequency of Social Gatherings with Friends and Family: More than three times a week   Attends Religious Services: 1 to 4 times per year   Active Member of [employer] or Organizations: No   Attends Archivist Meetings: Never   Marital Status: Widowed  Intimate  Partner Violence: Not At Risk   Fear of Current or Ex-Partner: No   Emotionally Abused: No   Physically Abused: No   Sexually Abused: No     Allergies  Allergen Reactions   Penicillins Swelling and Rash    Has patient had a PCN reaction causing immediate rash, facial/tongue/throat swelling, SOB or lightheadedness with hypotension: No Has patient had a PCN reaction causing severe rash involving mucus membranes or skin necrosis: No Has patient had a PCN reaction that required hospitalization unknown Has patient had a PCN reaction occurring within the last 10 years: No If all of the above answers are "NO", then may proceed with Cephalosporin use.    Sulfa Antibiotics Swelling and Rash   Sulfasalazine Rash and Swelling   Tramadol Other (See Comments)    Rapid heart rate   Ciprofloxacin     Muscular pain   Trimethoprim     rash   Isovue [Iopamidol] Hives    Pt had sneezing, itchy throat, a couple of hives and swollen, itchy left eye. Pt was given 50 mg po benadryl, and water.  Dr Carlis Abbott checked pt.  We observed pt for 30 mins w/ 5 minute BP checks.  Pt left w/o complication.  Pt will need full premeds in the future.  Alfonse Alpers, RTRCT     Outpatient Medications Prior to Visit  Medication Sig Dispense Refill   acetaminophen (TYLENOL) 650 MG CR tablet Take 650 mg by mouth West 8 (eight) hours as needed for pain.     albuterol (VENTOLIN HFA) 108 (90 Base) MCG/ACT inhaler  Inhale 2 puffs into the lungs West 4 (four) hours as needed for wheezing or shortness of breath. 1 each 2   ALPRAZolam (XANAX) 0.5 MG tablet Take 1 tablet (0.5 mg total) by mouth at bedtime. 90 tablet 1   atorvastatin (LIPITOR) 20 MG tablet Take 1 tablet (20 mg total) by mouth at bedtime. 90 tablet 3   carvedilol (COREG) 25 MG tablet TAKE 1 TABLET(25 MG) BY MOUTH TWICE DAILY 180 tablet 3   cefixime (SUPRAX) 400 MG CAPS capsule Take 1 capsule (400 mg total) by mouth daily. 7 capsule 0   cetirizine (ZYRTEC) 10 MG tablet TAKE 1 TABLET DAILY 90 tablet 3   Cholecalciferol (VITAMIN D) 2000 UNITS CAPS Take 2,000 Units by mouth daily.      ciclopirox (PENLAC) 8 % solution Apply topically at bedtime. Apply over nail and surrounding skin. Apply daily over previous coat. After seven (7) days, may remove with alcohol and continue cycle. 6.6 mL 0   famotidine (PEPCID) 40 MG tablet Take 40 mg by mouth 2 (two) times daily.     fluticasone (FLONASE) 50 MCG/ACT nasal spray Place 2 sprays into both nostrils daily. 16 g 2   fluticasone-salmeterol (ADVAIR HFA) 115-21 MCG/ACT inhaler Inhale 2 puffs into the lungs 2 (two) times daily. 36 g 3   ipratropium (ATROVENT) 0.03 % nasal spray Place 2 sprays into both nostrils 2 (two) times daily as needed for rhinitis. 30 mL 12   losartan (COZAAR) 25 MG tablet Take 1 tablet (25 mg total) by mouth daily. 90 tablet 3   montelukast (SINGULAIR) 10 MG tablet Take 1 tablet (10 mg total) by mouth at bedtime. 90 tablet 3   Multiple Vitamin (MULTIVITAMIN) tablet Take 1 tablet by mouth daily.     pantoprazole (PROTONIX) 40 MG tablet Take 40 mg by mouth daily.     polyethylene glycol powder (GLYCOLAX/MIRALAX) powder Take 17 g by mouth  daily as needed. 3350 g 1   Probiotic Product (PROBIOTIC PO) Take 1 capsule by mouth daily.      solifenacin (VESICARE) 5 MG tablet Take 5 mg by mouth daily.     LINZESS 290 MCG CAPS capsule Take 290 mcg by mouth daily. (Patient not taking: Reported on  02/21/2022)     No facility-administered medications prior to visit.   Review of Systems  Constitutional:  Negative for chills, fever, malaise/fatigue and weight loss.  HENT:  Negative for congestion, sinus pain and sore throat.   Eyes: Negative.   Respiratory:  Positive for cough and sputum production. Negative for hemoptysis, shortness of breath and wheezing.   Cardiovascular:  Negative for chest pain, palpitations, orthopnea, claudication and leg swelling.  Gastrointestinal:  Positive for heartburn. Negative for abdominal pain, nausea and vomiting.  Genitourinary: Negative.   Musculoskeletal:  Negative for joint pain and myalgias.  Skin:  Negative for rash.  Neurological:  Negative for weakness.  Endo/Heme/Allergies: Negative.   Psychiatric/Behavioral: Negative.     Objective:   Vitals:   02/21/22 1153  BP: 126/88  Pulse: 72  SpO2: 95%  Weight: 190 lb (86.2 kg)  Height: '5\' 3"'$  (1.6 m)    Physical Exam Constitutional:      General: She is not in acute distress.    Appearance: She is obese. She is not ill-appearing.  HENT:     Head: Normocephalic and atraumatic.     Nose: Nose normal.     Mouth/Throat:     Mouth: Mucous membranes are moist.     Pharynx: Oropharynx is clear.  Eyes:     General: No scleral icterus.    Conjunctiva/sclera: Conjunctivae normal.  Cardiovascular:     Rate and Rhythm: Normal rate and regular rhythm.     Pulses: Normal pulses.     Heart sounds: Normal heart sounds. No murmur heard. Pulmonary:     Effort: Pulmonary effort is normal.     Breath sounds: Normal breath sounds. No wheezing, rhonchi or rales.  Musculoskeletal:     Right lower leg: No edema.     Left lower leg: No edema.  Skin:    General: Skin is warm and dry.  Neurological:     General: No focal deficit present.     Mental Status: She is alert.   CBC    Component Value Date/Time   WBC 5.2 11/21/2021 1301   WBC 4.2 04/03/2021 1316   RBC 4.52 11/21/2021 1301   RBC 4.48  04/03/2021 1316   HGB 13.8 11/21/2021 1301   HCT 42.1 11/21/2021 1301   PLT 197 11/21/2021 1301   MCV 93 11/21/2021 1301   MCH 30.5 11/21/2021 1301   MCH 31.3 04/03/2021 1316   MCHC 32.8 11/21/2021 1301   MCHC 34.1 04/03/2021 1316   RDW 14.1 11/21/2021 1301   LYMPHSABS 1.3 11/21/2021 1301   MONOABS 0.4 11/29/2020 1108   EOSABS 0.2 11/21/2021 1301   BASOSABS 0.1 11/21/2021 1301      Latest Ref Rng & Units 01/18/2022    3:44 PM 11/21/2021    1:01 PM 04/03/2021    1:16 PM  BMP  Glucose 70 - 99 mg/dL 91   81   109    BUN 6 - 23 mg/dL '20   18   24    '$ Creatinine 0.40 - 1.20 mg/dL 0.86   0.87   1.10    BUN/Creat Ratio 12 - 28  21     Sodium 135 -  145 mEq/L 140   141   139    Potassium 3.5 - 5.1 mEq/L 4.2   4.8   4.2    Chloride 96 - 112 mEq/L 107   103   104    CO2 19 - 32 mEq/L '29   24   26    '$ Calcium 8.4 - 10.5 mg/dL 9.5   9.5   9.3     Chest imaging: CT Chest 10/05/21 1. The previous ground-glass nodules in the lingula have resolved in the interim, consistent with benign process. 2. The 4 mm right lower lobe nodule is unchanged from 2020 exam and is definitively benign. A 3 mm left lower lobe nodule is unchanged from most recent prior, previously obscured by motion. In a low risk patient, a nodule of this size needs no further follow-up. 3. Aortic atherosclerosis.  Coronary artery calcifications.  CT Chest 2020 Mediastinum/Nodes: There is no hilar or mediastinal adenopathy. The esophagus is grossly unremarkable. Subcentimeter right thyroid hypodense nodule. No mediastinal fluid collection.   Lungs/Pleura: There is no focal consolidation, pleural effusion, or pneumothorax. Faint scattered hazy densities, likely atelectatic changes. Atypical infiltrate is less likely. There is a 3 mm right lower lobe subpleural nodule (series 8, image 73). The central airways are patent.  PFT:    Latest Ref Rng & Units 05/17/2021    1:51 PM  PFT Results  FVC-Pre L 3.15    FVC-Predicted  Pre % 114    FVC-Post L 3.30    FVC-Predicted Post % 119    Pre FEV1/FVC % % 88    Post FEV1/FCV % % 58    FEV1-Pre L 2.76    FEV1-Predicted Pre % 132    FEV1-Post L 1.93    DLCO uncorrected ml/min/mmHg 20.02    DLCO UNC% % 110    DLCO corrected ml/min/mmHg 19.67    DLCO COR %Predicted % 108    DLVA Predicted % 105    TLC L 6.72    TLC % Predicted % 141    RV % Predicted % 80     Echo 06/11/2018: LV EF 60-65%. Grade 1 diastolic dysfunction. RV cavity is normal and normal systolic function.    Assessment & Plan:   Chronic cough - Plan: azithromycin (ZITHROMAX) 250 MG tablet  Discussion: Erin West is a 71 year old woman, never smoker with history of GERD, hypertension and DVT who returns to pulmonary clinic for cough.   Her cough was much improved on ICS/LABA therapy and ipratropium + fluticasone nasal spray therapy.  She has had an increase in symptoms since having COVID-19 infection in February 2023.  She now has pink tinged sputum. V/Q scan was negative for PE. 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  We will treat her with azithromycin and see if this helps clear up her pink tinged sputum.  She is to continue ipratropium and fluticasone nasal sprays for postnasal drainage along with sinus rinses.  She is to continue 40 mg daily of pantoprazole for GERD along with famotidine. She is to continue to sleep with the head of the bed elevated to reduce any nocturnal reflux symptoms. She may need further workup of her GERD by GI and consideration of referral to thoracic surgery if continues to be of significant issue.  Freda Jackson, MD [facility] Office:  [phone]   Current Outpatient Medications:    acetaminophen (TYLENOL) 650 MG CR tablet, Take 650 mg by mouth West 8 (eight) hours as needed for  pain., Disp: , Rfl:    albuterol (VENTOLIN HFA) 108 (90 Base) MCG/ACT inhaler, Inhale 2 puffs into the lungs West 4 (four) hours as needed for wheezing or shortness of breath., Disp: 1 each, Rfl: 2   ALPRAZolam (XANAX) 0.5 MG tablet, Take 1 tablet (0.5 mg total) by mouth at bedtime., Disp: 90 tablet, Rfl: 1   atorvastatin (LIPITOR) 20 MG tablet, Take 1 tablet (20 mg total) by mouth at bedtime., Disp: 90 tablet, Rfl: 3   azithromycin (ZITHROMAX) 250 MG tablet, Take as directed, Disp: 6 tablet, Rfl: 0   carvedilol (COREG) 25 MG tablet, TAKE 1 TABLET(25 MG) BY MOUTH TWICE DAILY, Disp: 180 tablet, Rfl: 3   cefixime (SUPRAX) 400 MG CAPS capsule, Take 1 capsule (400 mg total) by mouth daily., Disp: 7 capsule, Rfl: 0   cetirizine (ZYRTEC) 10 MG tablet, TAKE 1 TABLET DAILY, Disp: 90 tablet, Rfl: 3   Cholecalciferol (VITAMIN D) 2000 UNITS CAPS, Take 2,000 Units by mouth daily. , Disp: , Rfl:    ciclopirox (PENLAC) 8 % solution, Apply topically at bedtime. Apply over nail and surrounding skin. Apply daily over previous coat. After seven (7) days, may remove with alcohol and continue cycle., Disp: 6.6 mL, Rfl: 0   famotidine (PEPCID) 40 MG tablet, Take 40 mg by mouth 2 (two) times daily., Disp: , Rfl:    fluticasone (FLONASE) 50 MCG/ACT nasal spray, Place 2 sprays into both nostrils daily., Disp: 16 g, Rfl: 2   fluticasone-salmeterol (ADVAIR HFA) 115-21 MCG/ACT inhaler, Inhale 2 puffs into the lungs 2 (two) times daily., Disp: 36 g, Rfl: 3   ipratropium (ATROVENT) 0.03 % nasal spray, Place 2 sprays into both nostrils 2 (two) times daily as needed for rhinitis., Disp: 30 mL, Rfl: 12   losartan (COZAAR) 25 MG tablet, Take 1 tablet (25 mg total) by mouth daily., Disp: 90 tablet, Rfl: 3   montelukast (SINGULAIR) 10 MG tablet, Take 1 tablet (10 mg total) by mouth at bedtime., Disp: 90 tablet, Rfl: 3   Multiple Vitamin (MULTIVITAMIN) tablet, Take 1 tablet by mouth daily., Disp: , Rfl:    pantoprazole (PROTONIX) 40  MG tablet, Take 40 mg by mouth daily., Disp: , Rfl:    polyethylene glycol powder (GLYCOLAX/MIRALAX) powder, Take 17 g by mouth daily as needed., Disp: 3350 g, Rfl: 1   Probiotic Product (PROBIOTIC PO), Take 1 capsule by mouth daily. , Disp: , Rfl:    solifenacin (VESICARE) 5 MG tablet, Take 5 mg by mouth daily., Disp: , Rfl:    LINZESS 290 MCG CAPS capsule, Take 290 mcg by mouth daily. (Patient not taking: Reported on 02/21/2022), Disp: , Rfl:

## 2022-02-21 NOTE — Patient Instructions (Addendum)
Take Zpak antibiotic  Continue current inhaler regimen   Continue current nasal spray regimen and allergy medicines  Continue nasal rinses  Follow up in 6 months

## 2022-02-25 ENCOUNTER — Encounter: Payer: Self-pay | Admitting: Pulmonary Disease

## 2022-03-05 DIAGNOSIS — M19011 Primary osteoarthritis, right shoulder: Secondary | ICD-10-CM | POA: Diagnosis not present

## 2022-03-06 ENCOUNTER — Telehealth: Payer: Self-pay | Admitting: Family Medicine

## 2022-03-06 NOTE — Telephone Encounter (Signed)
My apologies-should have gone to Dr. Tamela Oddi team

## 2022-03-06 NOTE — Telephone Encounter (Signed)
Pt requesting diuretic to be prescribed for traveling to [place], [place]. Leaves on the 17th.   Pt states this has been prescribed previously as a preventative measure for ankle swelling.    Can this be prescribed without an OV?  If not, can a different provider at [facility] provider prescription.   Please advise.

## 2022-03-06 NOTE — Telephone Encounter (Signed)
I did about a 3-minute chart review and was unable to find where we prescribed this previously through Dr. Blima Ledger would recommend an office visit to discuss with one of our providers to review her health history and renal function

## 2022-03-07 NOTE — Telephone Encounter (Signed)
Pt is scheduled for 06/16 with SH at [facility]

## 2022-03-07 NOTE — Telephone Encounter (Signed)
Pt has been advised to schedule an OV to Diuretic to be prescribed.

## 2022-03-09 ENCOUNTER — Ambulatory Visit: Payer: Medicare Other | Admitting: Family Medicine

## 2022-03-19 DIAGNOSIS — R21 Rash and other nonspecific skin eruption: Secondary | ICD-10-CM | POA: Diagnosis not present

## 2022-03-20 ENCOUNTER — Ambulatory Visit: Payer: Medicare Other | Admitting: Family Medicine

## 2022-03-22 DIAGNOSIS — L509 Urticaria, unspecified: Secondary | ICD-10-CM | POA: Diagnosis not present

## 2022-03-26 ENCOUNTER — Ambulatory Visit: Payer: Medicare Other | Admitting: Family Medicine

## 2022-04-01 NOTE — Progress Notes (Unsigned)
Cardiology Office Note:    Date:  04/01/2022   ID:  Erin West, DOB [birth year], MRN [id number]  PCP:  Leamon Arnt, MD   [facility] Providers Cardiologist:  [name], MD { Click to update primary MD,subspecialty MD or APP then REFRESH:1}    Referring MD: Leamon Arnt, MD   No chief complaint on file. ***  History of Present Illness:    Erin West is a 71 y.o. female with a hx of hypertension, GERD, OA, hyperlipidemia, DVT, palpitations, and asthma.  She had a nonischemic stress test 04/01/2010.  She had a DVT 01/2016 following right TKA.  Echocardiogram 05/2018 showed normal LVEF, grade 1 DD, and no significant valvular disease.  Echocardiogram 05/2021 with similar findings. She has a history of 3 heart monitors, 2019, 2022, 2023, with NSR and PVCs. She has a history of coronary calcifications on CT chest 2022. Cardizem 120 mg added for palpitations in 2022 but she did not tolerate this medication due to fatigue.  She is followed by GI and pulmonology for GERD and hemoptysis.  She was last seen in clinic 11/21/2021 for more frequent/persistent palpitations after COVID-19 infection at the end of January.  3-day Zio patch was placed that showed normal sinus rhythm and PVCs.  Palpitations felt related to recent viral illness and anxiety.  She was continued on carvedilol 25 mg twice daily.  Previously intolerant to diltiazem.  Apparently she was seen by her dentist and told to see her cardiologist due to carotid artery stenosis.     Palpitations Laboratories unrevealing including TSH, magnesium, CBC, and CMP. Heart monitor continues to show normal sinus rhythm with PVCs Continue carvedilol 25 mg twice daily   Carotid artery stenosis Seen on plain films at dental office No syncope   Hyperlipidemia with LDL goal < 70 11/21/2021: Cholesterol, Total 151; HDL 71; LDL Chol Calc (NIH) 69; Triglycerides 54 Continue lipitor 20 mg   Hypertension Coreg,  losartan   Past Medical History:  Diagnosis Date   Anxiety    Arthritis    Aspiration pneumonia ([place]) 2013   Atypical chest pain    Stress test 04/21/10 - post-stress EF=89%. Normal scan.   Diarrhea    Diastolic dysfunction    But normal LV function on ECHO 2011 and low risk Myoview in 2011   DVT (deep venous thrombosis) (HCC)    Dyslipidemia    GERD (gastroesophageal reflux disease)    Hypertension    Osteoarthritis    Osteopenia    Palpitations    [employer] MCT Monitor 04/16/10-04/22/10    Syncope    Pain-mediated syncope    Past Surgical History:  Procedure Laterality Date   CHOLECYSTECTOMY  2006   KNEE ARTHROSCOPY Right    REPLACEMENT TOTAL KNEE Left    left   REPLACEMENT TOTAL KNEE Right    TUBAL LIGATION  06/1978    Current Medications: No outpatient medications have been marked as taking for the 04/02/22 encounter (Appointment) with [name], [place].     Allergies:   Penicillins, Sulfa antibiotics, Sulfasalazine, Tramadol, Ciprofloxacin, Trimethoprim, and Isovue [iopamidol]   Social History   Socioeconomic History   Marital status: Widowed    Spouse name: Not on file   Number of children: 3   Years of education: Not on file   Highest education level: Not on file  Occupational History   Not on file  Tobacco Use   Smoking status: Never   Smokeless tobacco: Never  Vaping  Use   Vaping Use: Never used  Substance and Sexual Activity   Alcohol use: Yes    Alcohol/week: 0.0 standard drinks of alcohol    Comment: 2-3/wine per month   Drug use: No   Sexual activity: Not Currently    Comment: 1st intercourse 71 yo-Fewer than 5 partners  Other Topics Concern   Not on file  Social History Narrative   Not on file   Social Determinants of Health   Financial Resource Strain: Low Risk  (12/26/2021)   Overall Financial Resource Strain (CARDIA)    Difficulty of Paying Living Expenses: Not hard at all  Food Insecurity: No Food Insecurity (12/26/2021)    Hunger Vital Sign    Worried About Running Out of Food in the Last Year: Never true    Ran Out of Food in the Last Year: Never true  Transportation Needs: No Transportation Needs (12/26/2021)   PRAPARE - Hydrologist (Medical): No    Lack of Transportation (Non-Medical): No  Physical Activity: Sufficiently Active (12/26/2021)   Exercise Vital Sign    Days of Exercise per Week: 4 days    Minutes of Exercise per Session: 60 min  Stress: No Stress Concern Present (12/26/2021)   [place]    Feeling of Stress : Only a little  Social Connections: Moderately Isolated (12/26/2021)   Social Connection and Isolation Panel [NHANES]    Frequency of Communication with Friends and Family: More than three times a week    Frequency of Social Gatherings with Friends and Family: More than three times a week    Attends Religious Services: 1 to 4 times per year    Active Member of [employer] or Organizations: No    Attends Archivist Meetings: Never    Marital Status: Widowed     Family History: The patient's ***family history includes Arthritis in her father and mother; Heart disease in her mother; Mental illness in her father; Osteoporosis in her mother; Other in her mother; Stroke in her paternal grandfather; Sudden death in her father; Thyroid disease in her father.  ROS:   Please see the history of present illness.    *** All other systems reviewed and are negative.  EKGs/Labs/Other Studies Reviewed:    The following studies were reviewed today:  Heart monitor 11/2021: Patient had a min HR of 66 bpm, max HR of 136 bpm, and avg HR of 88 bpm. Predominant underlying rhythm was Sinus Rhythm. 1 run of Supraventricular Tachycardia occurred lasting 4 beats with a max rate of 132 bpm (avg 129 bpm). Isolated SVEs were rare  (<1.0%), SVE Triplets were rare (<1.0%), and no SVE Couplets were present. Isolated VEs  were rare (<1.0%), and no VE Couplets or VE Triplets were present.    Normal sinus rhythm, sinus tachycardia, occasional PAC, 4 beat PAT, occasional PVC.  Symptoms associated with normal sinus rhythm and normal sinus rhythm with PVC.   Heart monitor 05/2021: Patient had a min HR of 56 bpm, max HR of 167 bpm, and avg HR of 75 bpm. Predominant underlying rhythm was Sinus Rhythm. First Degree AV Block was present. 2 Supraventricular Tachycardia runs occurred, the run with the fastest interval lasting 4 beats  with a max rate of 167 bpm, the longest lasting 17 beats with an avg rate of 117 bpm. Isolated SVEs were rare (<1.0%), SVE Couplets were rare (<1.0%), and SVE Triplets were rare (<1.0%). Isolated VEs  were rare (<1.0%), and no VE Couplets or VE Triplets  were present.   Sinus bradycardia, NSR, sinus tachycardia, occasional PAC, brief PAT and rare PVC.   Echo 06/16/21:  1. Left ventricular ejection fraction, by estimation, is 70 to 75%. The  left ventricle has hyperdynamic function. The left ventricle has no  regional wall motion abnormalities. Left ventricular diastolic parameters  were normal.   2. Right ventricular systolic function is normal. The right ventricular  size is normal.   3. The mitral valve is normal in structure. Trivial mitral valve  regurgitation.   4. The aortic valve is normal in structure. Aortic valve regurgitation is  trivial.   5. The inferior vena cava is normal in size with greater than 50%  respiratory variability, suggesting right atrial pressure of 3 mmHg.   EKG:  EKG is *** ordered today.  The ekg ordered today demonstrates ***  Recent Labs: 11/21/2021: ALT 16; Hemoglobin 13.8; Magnesium 2.4; Platelets 197; TSH 2.220 01/18/2022: BUN 20; Creatinine, Ser 0.86; Potassium 4.2; Pro B Natriuretic peptide (BNP) 96.0; Sodium 140  Recent Lipid Panel    Component Value Date/Time   CHOL 151 11/21/2021 1301   TRIG 54 11/21/2021 1301   HDL 71 11/21/2021 1301    CHOLHDL 2.1 11/21/2021 1301   CHOLHDL 3 11/29/2020 1108   VLDL 11.2 11/29/2020 1108   LDLCALC 69 11/21/2021 1301     Risk Assessment/Calculations:   {Does this patient have ATRIAL FIBRILLATION?:[id number]}       Physical Exam:    VS:  LMP 09/24/2012 (Approximate)     Wt Readings from Last 3 Encounters:  02/21/22 190 lb (86.2 kg)  01/26/22 193 lb 12.8 oz (87.9 kg)  01/23/22 197 lb 3.2 oz (89.4 kg)     GEN: *** Well nourished, well developed in no acute distress HEENT: Normal NECK: No JVD; No carotid bruits LYMPHATICS: No lymphadenopathy CARDIAC: ***RRR, no murmurs, rubs, gallops RESPIRATORY:  Clear to auscultation without rales, wheezing or rhonchi  ABDOMEN: Soft, non-tender, non-distended MUSCULOSKELETAL:  No edema; No deformity  SKIN: Warm and dry NEUROLOGIC:  Alert and oriented x 3 PSYCHIATRIC:  Normal affect   ASSESSMENT:    No diagnosis found. PLAN:    In order of problems listed above:  ***      {Are you ordering a CV Procedure (e.g. stress test, cath, DCCV, TEE, etc)?   Press F2        :[id number]}    Medication Adjustments/Labs and Tests Ordered: Current medicines are reviewed at length with the patient today.  Concerns regarding medicines are outlined above.  No orders of the defined types were placed in this encounter.  No orders of the defined types were placed in this encounter.   There are no Patient Instructions on file for this visit.   Signed, [name], [place]  04/01/2022 7:40 PM    [facility]

## 2022-04-02 ENCOUNTER — Encounter: Payer: Self-pay | Admitting: Physician Assistant

## 2022-04-02 ENCOUNTER — Ambulatory Visit (INDEPENDENT_AMBULATORY_CARE_PROVIDER_SITE_OTHER): Payer: Medicare Other | Admitting: Physician Assistant

## 2022-04-02 VITALS — BP 124/82 | HR 80 | Ht 63.0 in | Wt 193.4 lb

## 2022-04-02 DIAGNOSIS — R002 Palpitations: Secondary | ICD-10-CM | POA: Diagnosis not present

## 2022-04-02 DIAGNOSIS — E782 Mixed hyperlipidemia: Secondary | ICD-10-CM

## 2022-04-02 DIAGNOSIS — I10 Essential (primary) hypertension: Secondary | ICD-10-CM | POA: Diagnosis not present

## 2022-04-02 DIAGNOSIS — I6523 Occlusion and stenosis of bilateral carotid arteries: Secondary | ICD-10-CM | POA: Diagnosis not present

## 2022-04-02 NOTE — Patient Instructions (Signed)
Medication Instructions:  No Changes *If you need a refill on your cardiac medications before your next appointment, please call your pharmacy*   Lab Work: No Labs If you have labs (blood work) drawn today and your tests are completely normal, you will receive your results only by: [facility] (if you have MyChart) OR A paper copy in the mail If you have any lab test that is abnormal or we need to change your treatment, we will call you to review the results.   Testing/Procedures: [place], [address]. Your physician has requested that you have a carotid duplex. This test is an ultrasound of the carotid arteries in your neck. It looks at blood flow through these arteries that supply the brain with blood. Allow one hour for this exam. There are no restrictions or special instructions.    Follow-Up: At [facility], you and your health needs are our priority.  As part of our continuing mission to provide you with exceptional heart care, we have created designated Provider Care Teams.  These Care Teams include your primary Cardiologist (physician) and Advanced Practice Providers (APPs -  Physician Assistants and Nurse Practitioners) who all work together to provide you with the care you need, when you need it.  We recommend signing up for the patient portal called "MyChart".  Sign up information is provided on this After Visit Summary.  MyChart is used to connect with patients for Virtual Visits (Telemedicine).  Patients are able to view lab/test results, encounter notes, upcoming appointments, etc.  Non-urgent messages can be sent to your provider as well.   To learn more about what you can do with MyChart, go to [email or web address].    Your next appointment:   6 months  The format for your next appointment:   In Person  Provider:   [name], MD       Important Information About Sugar

## 2022-04-09 ENCOUNTER — Ambulatory Visit (HOSPITAL_COMMUNITY)
Admission: RE | Admit: 2022-04-09 | Discharge: 2022-04-09 | Disposition: A | Discharge status: Home / Self Care | Payer: Medicare Other | Source: Ambulatory Visit | Attending: Cardiology | Admitting: Cardiology

## 2022-04-09 DIAGNOSIS — I6523 Occlusion and stenosis of bilateral carotid arteries: Secondary | ICD-10-CM | POA: Diagnosis not present

## 2022-04-16 ENCOUNTER — Telehealth: Payer: Self-pay | Admitting: Cardiology

## 2022-04-16 NOTE — Telephone Encounter (Signed)
Patient called to get her Carotid testing results.

## 2022-04-16 NOTE — Telephone Encounter (Signed)
Spoke to patient she was calling for carotid doppler results.Advised results not available.I will send message to Dr.Crenshaw.

## 2022-04-17 NOTE — Telephone Encounter (Signed)
Spoke with pt, Aware of dr crenshaw's recommendations.  °

## 2022-05-03 ENCOUNTER — Encounter: Payer: Self-pay | Admitting: *Deleted

## 2022-05-19 DIAGNOSIS — S335XXA Sprain of ligaments of lumbar spine, initial encounter: Secondary | ICD-10-CM | POA: Diagnosis not present

## 2022-06-04 ENCOUNTER — Ambulatory Visit: Payer: Medicare Other | Admitting: Cardiology

## 2022-06-07 ENCOUNTER — Ambulatory Visit (INDEPENDENT_AMBULATORY_CARE_PROVIDER_SITE_OTHER): Payer: Medicare Other | Admitting: Family Medicine

## 2022-06-07 ENCOUNTER — Encounter: Payer: Self-pay | Admitting: Family Medicine

## 2022-06-07 VITALS — BP 134/82 | HR 69 | Temp 98.3°F | Ht 63.0 in | Wt 192.8 lb

## 2022-06-07 DIAGNOSIS — M19041 Primary osteoarthritis, right hand: Secondary | ICD-10-CM | POA: Diagnosis not present

## 2022-06-07 DIAGNOSIS — R053 Chronic cough: Secondary | ICD-10-CM | POA: Diagnosis not present

## 2022-06-07 DIAGNOSIS — I10 Essential (primary) hypertension: Secondary | ICD-10-CM

## 2022-06-07 DIAGNOSIS — M19042 Primary osteoarthritis, left hand: Secondary | ICD-10-CM | POA: Diagnosis not present

## 2022-06-07 DIAGNOSIS — Z23 Encounter for immunization: Secondary | ICD-10-CM | POA: Diagnosis not present

## 2022-06-07 MED ORDER — DICLOFENAC SODIUM 1 % EX GEL: 2.0000 g | Freq: Four times a day (QID) | CUTANEOUS | 3 refills | Status: DC | PRN

## 2022-06-07 NOTE — Patient Instructions (Signed)
Please return in 6 months for your annual complete physical; please come fasting.   If you have any questions or concerns, please don't hesitate to send me a message via MyChart or call the office at [phone]. Thank you for visiting with us today! It's our pleasure caring for you.  

## 2022-06-07 NOTE — Progress Notes (Signed)
Subjective  CC:  Chief Complaint  Patient presents with   Hypertension    Pt here for 64mo F/U with Bp    HPI: Erin DUNKELis a 71y.o. female who presents to the office today to address the problems listed above in the chief complaint. Hypertension f/u: Control is good . Pt reports she is doing well. taking medications as instructed, no medication side effects noted, no TIAs, no chest pain on exertion, no dyspnea on exertion, no swelling of ankles. Reviewed last card f/u note. Bp was a little high in that ov but has been normal to low at home. She denies adverse effects from his BP medications. Compliance with medication is good. Palpiations are controlled.  Chornic cough; off ace/arb but active now due to allergies. No infectious sxs. C/o joint pain in arthritic joints of hands. No erythema or warmth or trauma. Has seen hand ortho in past. No using any meds right now.   Assessment  1. Essential hypertension   2. Primary osteoarthritis of both hands   3. Chronic cough   4. Need for immunization against influenza      Plan   Hypertension f/u: BP control is well controlled. Continue coreg bid. And home monitoring.  OA: start voltaren gel qid prn. Education given. Chronic cough: stable. Treating allergies.  Flu shot today.   Education regarding management of these chronic disease states was given. Management strategies discussed on successive visits include dietary and exercise recommendations, goals of achieving and maintaining IBW, and lifestyle modifications aiming for adequate sleep and minimizing stressors.   Follow up: No follow-ups on file.  Orders Placed This Encounter  Procedures   Flu Vaccine QUAD High Dose(Fluad)   Meds ordered this encounter  Medications   diclofenac Sodium (VOLTAREN) 1 % GEL    Sig: Apply 2 g topically 4 (four) times daily as needed (arthritis pain, hands).    Dispense:  300 g    Refill:  3      BP Readings from Last 3 Encounters:   06/07/22 134/82  04/02/22 124/82  02/21/22 126/88   Wt Readings from Last 3 Encounters:  06/07/22 192 lb 12.8 oz (87.5 kg)  04/02/22 193 lb 6.4 oz (87.7 kg)  02/21/22 190 lb (86.2 kg)    Lab Results  Component Value Date   CHOL 151 11/21/2021   CHOL 180 11/29/2020   CHOL 163 10/28/2019   Lab Results  Component Value Date   HDL 71 11/21/2021   HDL 71.00 11/29/2020   HDL 53.00 10/28/2019   Lab Results  Component Value Date   LDLCALC 69 11/21/2021   LDLCALC 98 11/29/2020   LDLCALC 94 10/28/2019   Lab Results  Component Value Date   TRIG 54 11/21/2021   TRIG 56.0 11/29/2020   TRIG 80.0 10/28/2019   Lab Results  Component Value Date   CHOLHDL 2.1 11/21/2021   CHOLHDL 3 11/29/2020   CHOLHDL 3 10/28/2019   No results found for: "LDLDIRECT" Lab Results  Component Value Date   CREATININE 0.86 01/18/2022   BUN 20 01/18/2022   NA 140 01/18/2022   K 4.2 01/18/2022   CL 107 01/18/2022   CO2 29 01/18/2022    The 10-year ASCVD risk score (Arnett DK, et al., 2019) is: 13.7%   Values used to calculate the score:     Age: [age]years     Sex: Female     Is Non-Hispanic African American: No     Diabetic: No  Tobacco smoker: No     Systolic Blood Pressure: 229 mmHg     Is BP treated: Yes     HDL Cholesterol: 71 mg/dL     Total Cholesterol: 151 mg/dL  I reviewed the patients updated PMH, FH, and SocHx.    Patient Active Problem List   Diagnosis Date Noted   Chronic prescription benzodiazepine use 09/20/2021    Priority: High   Depression, major, single episode, moderate ([place]) 11/02/2020    Priority: High   Mixed hyperlipidemia 12/04/2017    Priority: High   Chronic cough 07/24/2017    Priority: High   Essential hypertension     Priority: High   Osteoarthritis of left glenohumeral joint 04/19/2020    Priority: Medium    OAB (overactive bladder) 10/28/2019    Priority: Medium    Chronic seasonal allergic rhinitis 12/04/2017    Priority: Medium    Chronic  pansinusitis 09/09/2017    Priority: Medium    Laryngopharyngeal reflux (LPR) 09/09/2017    Priority: Medium    Bilateral leg edema 03/30/2014    Priority: Medium    History of gastritis 02/10/2014    Priority: Medium    Dysphagia 01/28/2014    Priority: Medium    Irritable bowel syndrome (IBS) 09/08/2013    Priority: Medium    Osteopenia 07/14/2013    Priority: Medium    Osteoarthritis, multiple sites 07/14/2013    Priority: Medium    Gastroesophageal reflux disease without esophagitis     Priority: Medium    Atrophic vaginitis 10/22/2018    Priority: Low   Status post total right knee replacement 01/24/2016    Priority: Low   Asthma 01/18/2022   Hemoptysis 01/18/2022    Allergies: Penicillins, Sulfa antibiotics, Sulfasalazine, Tramadol, Ciprofloxacin, Trimethoprim, and Isovue [iopamidol]  Social History: Patient  reports that she has never smoked. She has never used smokeless tobacco. She reports current alcohol use. She reports that she does not use drugs.  Current Meds  Medication Sig   acetaminophen (TYLENOL) 650 MG CR tablet Take 650 mg by mouth every 8 (eight) hours as needed for pain.   albuterol (VENTOLIN HFA) 108 (90 Base) MCG/ACT inhaler Inhale 2 puffs into the lungs every 4 (four) hours as needed for wheezing or shortness of breath.   ALPRAZolam (XANAX) 0.5 MG tablet Take 1 tablet (0.5 mg total) by mouth at bedtime.   atorvastatin (LIPITOR) 20 MG tablet Take 1 tablet (20 mg total) by mouth at bedtime.   carvedilol (COREG) 25 MG tablet TAKE 1 TABLET(25 MG) BY MOUTH TWICE DAILY   cefixime (SUPRAX) 400 MG CAPS capsule Take 1 capsule (400 mg total) by mouth daily.   cetirizine (ZYRTEC) 10 MG tablet TAKE 1 TABLET DAILY   Cholecalciferol (VITAMIN D) 2000 UNITS CAPS Take 2,000 Units by mouth daily.    ciclopirox (PENLAC) 8 % solution Apply topically at bedtime. Apply over nail and surrounding skin. Apply daily over previous coat. After seven (7) days, may remove with  alcohol and continue cycle.   diclofenac Sodium (VOLTAREN) 1 % GEL Apply 2 g topically 4 (four) times daily as needed (arthritis pain, hands).   famotidine (PEPCID) 40 MG tablet Take 40 mg by mouth 2 (two) times daily.   fluticasone (FLONASE) 50 MCG/ACT nasal spray Place 2 sprays into both nostrils daily.   fluticasone-salmeterol (ADVAIR HFA) 115-21 MCG/ACT inhaler Inhale 2 puffs into the lungs 2 (two) times daily.   ipratropium (ATROVENT) 0.03 % nasal spray Place 2 sprays into both  nostrils 2 (two) times daily as needed for rhinitis.   LINZESS 290 MCG CAPS capsule Take 290 mcg by mouth daily.   montelukast (SINGULAIR) 10 MG tablet Take 1 tablet (10 mg total) by mouth at bedtime.   Multiple Vitamin (MULTIVITAMIN) tablet Take 1 tablet by mouth daily.   pantoprazole (PROTONIX) 40 MG tablet Take 40 mg by mouth daily.   polyethylene glycol powder (GLYCOLAX/MIRALAX) powder Take 17 g by mouth daily as needed.   Probiotic Product (PROBIOTIC PO) Take 1 capsule by mouth daily.    solifenacin (VESICARE) 5 MG tablet Take 5 mg by mouth daily.    Review of Systems: Cardiovascular: negative for chest pain, palpitations, leg swelling, orthopnea Respiratory: negative for SOB, wheezing or persistent cough Gastrointestinal: negative for abdominal pain Genitourinary: negative for dysuria or gross hematuria  Objective  Vitals: BP 134/82   Pulse 69   Temp 98.3 F (36.8 C)   Ht '5\' 3"'$  (1.6 m)   Wt 192 lb 12.8 oz (87.5 kg)   LMP 09/24/2012 (Approximate)   SpO2 98%   BMI 34.15 kg/m  General: no acute distress  Psych:  Alert and oriented, normal mood and affect HEENT:  Normocephalic, atraumatic, supple neck  Cardiovascular:  RRR without murmur. no edema Respiratory:  Good breath sounds bilaterally, CTAB with normal respiratory effort Hands: DIP w/o OA changes w/o redness wartmh Commons side effects, risks, benefits, and alternatives for medications and treatment plan prescribed today were discussed, and  the patient expressed understanding of the given instructions. Patient is instructed to call or message via MyChart if he/she has any questions or concerns regarding our treatment plan. No barriers to understanding were identified. We discussed Red Flag symptoms and signs in detail. Patient expressed understanding regarding what to do in case of urgent or emergency type symptoms.  Medication list was reconciled, printed and provided to the patient in AVS. Patient instructions and summary information was reviewed with the patient as documented in the AVS. This note was prepared with assistance of Dragon voice recognition software. Occasional wrong-word or sound-a-like substitutions may have occurred due to the inherent limitation

## 2022-07-05 DIAGNOSIS — M180 Bilateral primary osteoarthritis of first carpometacarpal joints: Secondary | ICD-10-CM | POA: Diagnosis not present

## 2022-07-05 DIAGNOSIS — M79642 Pain in left hand: Secondary | ICD-10-CM | POA: Diagnosis not present

## 2022-07-05 DIAGNOSIS — M79641 Pain in right hand: Secondary | ICD-10-CM | POA: Diagnosis not present

## 2022-08-21 ENCOUNTER — Ambulatory Visit (INDEPENDENT_AMBULATORY_CARE_PROVIDER_SITE_OTHER): Payer: Medicare Other | Admitting: Pulmonary Disease

## 2022-08-21 ENCOUNTER — Encounter: Payer: Self-pay | Admitting: Pulmonary Disease

## 2022-08-21 VITALS — BP 128/84 | HR 68 | Ht 63.0 in | Wt 190.0 lb

## 2022-08-21 DIAGNOSIS — R0982 Postnasal drip: Secondary | ICD-10-CM | POA: Diagnosis not present

## 2022-08-21 DIAGNOSIS — R053 Chronic cough: Secondary | ICD-10-CM | POA: Diagnosis not present

## 2022-08-21 DIAGNOSIS — K219 Gastro-esophageal reflux disease without esophagitis: Secondary | ICD-10-CM

## 2022-08-21 NOTE — Patient Instructions (Addendum)
Stop Advair inhaler   Continue current nasal spray regimen and allergy medicines   Continue nasal rinses   Use flutter valve twice daily for mucous clearance  Follow up with Dr. Collene Mares regarding your reflux treatment  Follow up in 6 months

## 2022-08-21 NOTE — Progress Notes (Signed)
Synopsis: Referred in June 2022 for Bronchitis by Beverley Fiedler, NP  Subjective:   PATIENT ID: Erin West GENDER: female DOB: 27-Nov-1950, MRN: [id number]  HPI  Chief Complaint  Patient presents with   Follow-up    6 mo f/u for cough. States she has a productive cough with pink phlegm.    Erin West is a 71 year old woman, never smoker with history of GERD, hypertension and DVT who returns to pulmonary clinic for cough.   Her cough remains the same since last visit and is intermittently coughing pinkish phlegm. She did not notice improvement with Zpak course after last visit. She continues to have issues with GERD.  OV 02/21/22 She was seen in acute visit by Rexene Edison, NP on 01/18/22 for cough with thick mucous that is occasional pink tinged. No frank hemoptysis noted. D-dimer testing was elevated so a V/Q scan was ordered which was negative for pulmonary emboli.   She had colonoscopy and EGD 2 weeks ago by Dr. Collene Mares. They removed 2 polyps during the colonoscopy. She continues to have significant GERD symptoms despite sleeping with head of bed elevated, protonix and famotidine therapy.  She is using netty pot 2-3 times per week for sinus congestion. She denies any fevers, chills or sweats. She has lost 8-9lbs since February.   OV 12/19/21 She has been experiencing shortness of breath and wheezing past 2 weeks and she reports having COVID in February of this year.  Her cough was worse when she was sick with COVID but then the cough got better for a couple of weeks but has since returned and has been bothering her.  She is still having issues with GERD despite taking PPI 30 minutes before breakfast and Pepcid twice daily along with sleeping with the head of her bed elevated.  She also reports postnasal drainage since last week and is using a Nettie pot.  She continues to take Zyrtec daily and Singulair daily.  She is using Advair 2 puffs twice daily.  She is not currently using  any nasal sprays.  She was seen by ENT 05/2021 where flexible laryngoscopy showed laryngopharyngeal reflux.  OV 05/17/21 She reports improvement in her cough with starting inhaler therapy with advair 115-58mg 2 puffs twice daily since last visit along with fluticasone and ipratropium nasal sprays for post nasal drainage. She rates her cough at a level 4 currently where is was previously an 8-9 (10 being the worst). She continues to have issues with GERD despite using pantoprazole '40mg'$  daily and elevated the head of her bed at night.   We discussed her CT Chest scan over the phone. She has multiple subcentimeter nodules that will require follow up in the future. 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   She has referral to ENT clinic for evaluation of the cough and is awaiting to schedule this visit.  OV 02/2021 She reports having 2 episodes of bronchitis per year since 2018 which include prolonged symptoms of wheezing, cough and shortness of breath.  Her most recent episode was last month where she received an albuterol inhaler, azithromycin and a course of prednisone which helped her symptoms resolved.  She reports the cough is mainly dry.  The albuterol does help somewhat with the wheezing.  She does have episodes of sinus congestion and postnasal drainage when she is acutely ill with these episodes.  She also complains of bad heartburn/reflux symptoms and does note that  her reflux symptoms were increased prior to her last episodes of bronchitis.  She denies any seasonal allergy symptoms or worsening of cough or wheezing with cold air or really hot weather.  She is currently taking pantoprazole 40 mg daily for her GERD.  This was recently increased about 2 months ago from 20 mg daily.  She mainly sleeps flat at night on her sides but is unable to lay flat on her back.  She does have a height adjustable bed frame but does not sleep with the head of the bed  elevated at this time.  She does report some nasal congestion and postnasal drainage.  She is currently using fluticasone nasal spray 2 sprays per nostril daily.  Past Medical History:  Diagnosis Date   Anxiety    Arthritis    Aspiration pneumonia ([place]) 2013   Atypical chest pain    Stress test 04/21/10 - post-stress EF=89%. Normal scan.   Diarrhea    Diastolic dysfunction    But normal LV function on ECHO 2011 and low risk Myoview in 2011   DVT (deep venous thrombosis) (HCC)    Dyslipidemia    GERD (gastroesophageal reflux disease)    Hypertension    Osteoarthritis    Osteopenia    Palpitations    [employer] MCT Monitor 04/16/10-04/22/10    Syncope    Pain-mediated syncope     Family History  Problem Relation Age of Onset   Heart disease Mother    Other Mother        leg amputation   Arthritis Mother    Osteoporosis Mother    Mental illness Father    Sudden death Father    Arthritis Father    Thyroid disease Father    Stroke Paternal Grandfather      Social History   Socioeconomic History   Marital status: Widowed    Spouse name: Not on file   Number of children: 3   Years of education: Not on file   Highest education level: Not on file  Occupational History   Not on file  Tobacco Use   Smoking status: Never   Smokeless tobacco: Never  Vaping Use   Vaping Use: Never used  Substance and Sexual Activity   Alcohol use: Yes    Alcohol/week: 0.0 standard drinks of alcohol    Comment: 2-3/wine per month   Drug use: No   Sexual activity: Not Currently    Comment: 1st intercourse 71 yo-Fewer than 5 partners  Other Topics Concern   Not on file  Social History Narrative   Not on file   Social Determinants of Health   Financial Resource Strain: Low Risk  (12/26/2021)   Overall Financial Resource Strain (CARDIA)    Difficulty of Paying Living Expenses: Not hard at all  Food Insecurity: No Food Insecurity (12/26/2021)   Hunger Vital Sign    Worried About Running  Out of Food in the Last Year: Never true    Ran Out of Food in the Last Year: Never true  Transportation Needs: No Transportation Needs (12/26/2021)   PRAPARE - Hydrologist (Medical): No    Lack of Transportation (Non-Medical): No  Physical Activity: Sufficiently Active (12/26/2021)   Exercise Vital Sign    Days of Exercise per Week: 4 days    Minutes of Exercise per Session: 60 min  Stress: No Stress Concern Present (12/26/2021)   [place]  Feeling of Stress : Only a little  Social Connections: Moderately Isolated (12/26/2021)   Social Connection and Isolation Panel [NHANES]    Frequency of Communication with Friends and Family: More than three times a week    Frequency of Social Gatherings with Friends and Family: More than three times a week    Attends Religious Services: 1 to 4 times per year    Active Member of [employer] or Organizations: No    Attends Archivist Meetings: Never    Marital Status: Widowed  Intimate Partner Violence: Not At Risk (12/26/2021)   Humiliation, Afraid, Rape, and Kick questionnaire    Fear of Current or Ex-Partner: No    Emotionally Abused: No    Physically Abused: No    Sexually Abused: No     Allergies  Allergen Reactions   Penicillins Swelling and Rash    Has patient had a PCN reaction causing immediate rash, facial/tongue/throat swelling, SOB or lightheadedness with hypotension: No Has patient had a PCN reaction causing severe rash involving mucus membranes or skin necrosis: No Has patient had a PCN reaction that required hospitalization unknown Has patient had a PCN reaction occurring within the last 10 years: No If all of the above answers are "NO", then may proceed with Cephalosporin use.    Sulfa Antibiotics Swelling and Rash   Sulfasalazine Rash and Swelling   Tramadol Other (See Comments)    Rapid heart rate   Ciprofloxacin     Muscular  pain   Trimethoprim     rash   Isovue [Iopamidol] Hives    Pt had sneezing, itchy throat, a couple of hives and swollen, itchy left eye. Pt was given 50 mg po benadryl, and water.  Dr Carlis Abbott checked pt.  We observed pt for 30 mins w/ 5 minute BP checks.  Pt left w/o complication.  Pt will need full premeds in the future.  Alfonse Alpers, RTRCT     Outpatient Medications Prior to Visit  Medication Sig Dispense Refill   acetaminophen (TYLENOL) 650 MG CR tablet Take 650 mg by mouth West 8 (eight) hours as needed for pain.     albuterol (VENTOLIN HFA) 108 (90 Base) MCG/ACT inhaler Inhale 2 puffs into the lungs West 4 (four) hours as needed for wheezing or shortness of breath. 1 each 2   ALPRAZolam (XANAX) 0.5 MG tablet Take 1 tablet (0.5 mg total) by mouth at bedtime. 90 tablet 1   atorvastatin (LIPITOR) 20 MG tablet Take 1 tablet (20 mg total) by mouth at bedtime. 90 tablet 3   carvedilol (COREG) 25 MG tablet TAKE 1 TABLET(25 MG) BY MOUTH TWICE DAILY 180 tablet 3   cetirizine (ZYRTEC) 10 MG tablet TAKE 1 TABLET DAILY 90 tablet 3   Cholecalciferol (VITAMIN D) 2000 UNITS CAPS Take 2,000 Units by mouth daily.      ciclopirox (PENLAC) 8 % solution Apply topically at bedtime. Apply over nail and surrounding skin. Apply daily over previous coat. After seven (7) days, may remove with alcohol and continue cycle. 6.6 mL 0   diclofenac Sodium (VOLTAREN) 1 % GEL Apply 2 g topically 4 (four) times daily as needed (arthritis pain, hands). 300 g 3   famotidine (PEPCID) 40 MG tablet Take 40 mg by mouth 2 (two) times daily.     fluticasone (FLONASE) 50 MCG/ACT nasal spray Place 2 sprays into both nostrils daily. 16 g 2   ipratropium (ATROVENT) 0.03 % nasal spray Place 2 sprays into both nostrils 2 (two)  times daily as needed for rhinitis. 30 mL 12   LINZESS 290 MCG CAPS capsule Take 290 mcg by mouth daily.     montelukast (SINGULAIR) 10 MG tablet Take 1 tablet (10 mg total) by mouth at bedtime. 90 tablet 3   Multiple  Vitamin (MULTIVITAMIN) tablet Take 1 tablet by mouth daily.     pantoprazole (PROTONIX) 40 MG tablet Take 40 mg by mouth daily.     polyethylene glycol powder (GLYCOLAX/MIRALAX) powder Take 17 g by mouth daily as needed. 3350 g 1   Probiotic Product (PROBIOTIC PO) Take 1 capsule by mouth daily.      solifenacin (VESICARE) 5 MG tablet Take 5 mg by mouth daily.     cefixime (SUPRAX) 400 MG CAPS capsule Take 1 capsule (400 mg total) by mouth daily. 7 capsule 0   fluticasone-salmeterol (ADVAIR HFA) 115-21 MCG/ACT inhaler Inhale 2 puffs into the lungs 2 (two) times daily. 36 g 3   No facility-administered medications prior to visit.   Review of Systems  Constitutional:  Negative for chills, fever, malaise/fatigue and weight loss.  HENT:  Negative for congestion, sinus pain and sore throat.   Eyes: Negative.   Respiratory:  Positive for cough and sputum production. Negative for hemoptysis, shortness of breath and wheezing.   Cardiovascular:  Negative for chest pain, palpitations, orthopnea, claudication and leg swelling.  Gastrointestinal:  Positive for heartburn. Negative for abdominal pain, nausea and vomiting.  Genitourinary: Negative.   Musculoskeletal:  Negative for joint pain and myalgias.  Skin:  Negative for rash.  Neurological:  Negative for weakness.  Endo/Heme/Allergies: Negative.   Psychiatric/Behavioral: Negative.      Objective:   Vitals:   08/21/22 1439  BP: 128/84  Pulse: 68  SpO2: 99%  Weight: 190 lb (86.2 kg)  Height: '5\' 3"'$  (1.6 m)    Physical Exam Constitutional:      General: She is not in acute distress.    Appearance: She is obese. She is not ill-appearing.  HENT:     Head: Normocephalic and atraumatic.     Nose: Nose normal.     Mouth/Throat:     Mouth: Mucous membranes are moist.     Pharynx: Oropharynx is clear.  Eyes:     General: No scleral icterus.    Conjunctiva/sclera: Conjunctivae normal.  Cardiovascular:     Rate and Rhythm: Normal rate and  regular rhythm.     Pulses: Normal pulses.     Heart sounds: Normal heart sounds. No murmur heard. Pulmonary:     Effort: Pulmonary effort is normal.     Breath sounds: Normal breath sounds. No wheezing, rhonchi or rales.  Musculoskeletal:     Right lower leg: No edema.     Left lower leg: No edema.  Skin:    General: Skin is warm and dry.  Neurological:     Mental Status: She is alert.    CBC    Component Value Date/Time   WBC 5.2 11/21/2021 1301   WBC 4.2 04/03/2021 1316   RBC 4.52 11/21/2021 1301   RBC 4.48 04/03/2021 1316   HGB 13.8 11/21/2021 1301   HCT 42.1 11/21/2021 1301   PLT 197 11/21/2021 1301   MCV 93 11/21/2021 1301   MCH 30.5 11/21/2021 1301   MCH 31.3 04/03/2021 1316   MCHC 32.8 11/21/2021 1301   MCHC 34.1 04/03/2021 1316   RDW 14.1 11/21/2021 1301   LYMPHSABS 1.3 11/21/2021 1301   MONOABS 0.4 11/29/2020 1108   EOSABS  0.2 11/21/2021 1301   BASOSABS 0.1 11/21/2021 1301      Latest Ref Rng & Units 01/18/2022    3:44 PM 11/21/2021    1:01 PM 04/03/2021    1:16 PM  BMP  Glucose 70 - 99 mg/dL 91  81  109   BUN 6 - 23 mg/dL '20  18  24   '$ Creatinine 0.40 - 1.20 mg/dL 0.86  0.87  1.10   BUN/Creat Ratio 12 - 28  21    Sodium 135 - 145 mEq/L 140  141  139   Potassium 3.5 - 5.1 mEq/L 4.2  4.8  4.2   Chloride 96 - 112 mEq/L 107  103  104   CO2 19 - 32 mEq/L '29  24  26   '$ Calcium 8.4 - 10.5 mg/dL 9.5  9.5  9.3    Chest imaging: CT Chest 10/05/21 1. The previous ground-glass nodules in the lingula have resolved in the interim, consistent with benign process. 2. The 4 mm right lower lobe nodule is unchanged from 2020 exam and is definitively benign. A 3 mm left lower lobe nodule is unchanged from most recent prior, previously obscured by motion. In a low risk patient, a nodule of this size needs no further follow-up. 3. Aortic atherosclerosis.  Coronary artery calcifications.  CT Chest 2020 Mediastinum/Nodes: There is no hilar or mediastinal adenopathy.  The esophagus is grossly unremarkable. Subcentimeter right thyroid hypodense nodule. No mediastinal fluid collection.   Lungs/Pleura: There is no focal consolidation, pleural effusion, or pneumothorax. Faint scattered hazy densities, likely atelectatic changes. Atypical infiltrate is less likely. There is a 3 mm right lower lobe subpleural nodule (series 8, image 73). The central airways are patent.  PFT:    Latest Ref Rng & Units 05/17/2021    1:51 PM  PFT Results  FVC-Pre L 3.15   FVC-Predicted Pre % 114   FVC-Post L 3.30   FVC-Predicted Post % 119   Pre FEV1/FVC % % 88   Post FEV1/FCV % % 58   FEV1-Pre L 2.76   FEV1-Predicted Pre % 132   FEV1-Post L 1.93   DLCO uncorrected ml/min/mmHg 20.02   DLCO UNC% % 110   DLCO corrected ml/min/mmHg 19.67   DLCO COR %Predicted % 108   DLVA Predicted % 105   TLC L 6.72   TLC % Predicted % 141   RV % Predicted % 80    Echo 06/11/2018: LV EF 60-65%. Grade 1 diastolic dysfunction. RV cavity is normal and normal systolic function.    Assessment & Plan:   Chronic cough  Gastroesophageal reflux disease without esophagitis  Post-nasal drainage  Discussion: Erin West is a 71 year old woman, never smoker with history of GERD, hypertension and DVT who returns to pulmonary clinic for cough.   She is to continue ipratropium and fluticasone nasal sprays.   She can stop advair inhaler therapy and monitor for changes in her cough.   V/Q scan was negative for PE.   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 Recommend she follow up with her GI doctor regarding ongoing GERD therapies.  She is to continue 40 mg daily of pantoprazole for GERD along with famotidine. She is to continue to sleep with the head of the bed elevated to reduce any nocturnal reflux symptoms.  Freda Jackson,  MD [facility] Office: [phone]   Current Outpatient Medications:    acetaminophen (TYLENOL) 650 MG CR tablet, Take 650 mg by mouth West 8 (eight) hours as needed for pain., Disp: , Rfl:    albuterol (VENTOLIN HFA) 108 (90 Base) MCG/ACT inhaler, Inhale 2 puffs into the lungs West 4 (four) hours as needed for wheezing or shortness of breath., Disp: 1 each, Rfl: 2   ALPRAZolam (XANAX) 0.5 MG tablet, Take 1 tablet (0.5 mg total) by mouth at bedtime., Disp: 90 tablet, Rfl: 1   atorvastatin (LIPITOR) 20 MG tablet, Take 1 tablet (20 mg total) by mouth at bedtime., Disp: 90 tablet, Rfl: 3   carvedilol (COREG) 25 MG tablet, TAKE 1 TABLET(25 MG) BY MOUTH TWICE DAILY, Disp: 180 tablet, Rfl: 3   cetirizine (ZYRTEC) 10 MG tablet, TAKE 1 TABLET DAILY, Disp: 90 tablet, Rfl: 3   Cholecalciferol (VITAMIN D) 2000 UNITS CAPS, Take 2,000 Units by mouth daily. , Disp: , Rfl:    ciclopirox (PENLAC) 8 % solution, Apply topically at bedtime. Apply over nail and surrounding skin. Apply daily over previous coat. After seven (7) days, may remove with alcohol and continue cycle., Disp: 6.6 mL, Rfl: 0   diclofenac Sodium (VOLTAREN) 1 % GEL, Apply 2 g topically 4 (four) times daily as needed (arthritis pain, hands)., Disp: 300 g, Rfl: 3   famotidine (PEPCID) 40 MG tablet, Take 40 mg by mouth 2 (two) times daily., Disp: , Rfl:    fluticasone (FLONASE) 50 MCG/ACT nasal spray, Place 2 sprays into both nostrils daily., Disp: 16 g, Rfl: 2   ipratropium (ATROVENT) 0.03 % nasal spray, Place 2 sprays into both nostrils 2 (two) times daily as needed for rhinitis., Disp: 30 mL, Rfl: 12   LINZESS 290 MCG CAPS capsule, Take 290 mcg by mouth daily., Disp: , Rfl:    montelukast (SINGULAIR) 10 MG tablet, Take 1 tablet (10 mg total) by mouth at bedtime., Disp: 90 tablet, Rfl: 3   Multiple Vitamin (MULTIVITAMIN) tablet, Take 1 tablet by mouth daily., Disp: , Rfl:    pantoprazole (PROTONIX) 40 MG tablet, Take 40  mg by mouth daily., Disp: , Rfl:    polyethylene glycol powder (GLYCOLAX/MIRALAX) powder, Take 17 g by mouth daily as needed., Disp: 3350 g, Rfl: 1   Probiotic Product (PROBIOTIC PO), Take 1 capsule by mouth daily. , Disp: , Rfl:    solifenacin (VESICARE) 5 MG tablet, Take 5 mg by mouth daily., Disp: , Rfl:

## 2022-09-03 DIAGNOSIS — M25561 Pain in right knee: Secondary | ICD-10-CM | POA: Diagnosis not present

## 2022-09-03 DIAGNOSIS — M25551 Pain in right hip: Secondary | ICD-10-CM | POA: Diagnosis not present

## 2022-09-04 DIAGNOSIS — R059 Cough, unspecified: Secondary | ICD-10-CM | POA: Diagnosis not present

## 2022-09-04 DIAGNOSIS — Z20822 Contact with and (suspected) exposure to covid-19: Secondary | ICD-10-CM | POA: Diagnosis not present

## 2022-09-05 DIAGNOSIS — R059 Cough, unspecified: Secondary | ICD-10-CM | POA: Diagnosis not present

## 2022-09-05 DIAGNOSIS — Z20822 Contact with and (suspected) exposure to covid-19: Secondary | ICD-10-CM | POA: Diagnosis not present

## 2022-09-07 DIAGNOSIS — M7061 Trochanteric bursitis, right hip: Secondary | ICD-10-CM | POA: Diagnosis not present

## 2022-09-08 DIAGNOSIS — Z20822 Contact with and (suspected) exposure to covid-19: Secondary | ICD-10-CM | POA: Diagnosis not present

## 2022-09-08 DIAGNOSIS — R059 Cough, unspecified: Secondary | ICD-10-CM | POA: Diagnosis not present

## 2022-09-09 DIAGNOSIS — Z20822 Contact with and (suspected) exposure to covid-19: Secondary | ICD-10-CM | POA: Diagnosis not present

## 2022-09-09 DIAGNOSIS — R059 Cough, unspecified: Secondary | ICD-10-CM | POA: Diagnosis not present

## 2022-09-12 DIAGNOSIS — R059 Cough, unspecified: Secondary | ICD-10-CM | POA: Diagnosis not present

## 2022-09-12 DIAGNOSIS — Z20822 Contact with and (suspected) exposure to covid-19: Secondary | ICD-10-CM | POA: Diagnosis not present

## 2022-09-15 DIAGNOSIS — R059 Cough, unspecified: Secondary | ICD-10-CM | POA: Diagnosis not present

## 2022-09-15 DIAGNOSIS — Z20822 Contact with and (suspected) exposure to covid-19: Secondary | ICD-10-CM | POA: Diagnosis not present

## 2022-09-16 DIAGNOSIS — Z20822 Contact with and (suspected) exposure to covid-19: Secondary | ICD-10-CM | POA: Diagnosis not present

## 2022-09-16 DIAGNOSIS — R059 Cough, unspecified: Secondary | ICD-10-CM | POA: Diagnosis not present

## 2022-09-19 DIAGNOSIS — Z20822 Contact with and (suspected) exposure to covid-19: Secondary | ICD-10-CM | POA: Diagnosis not present

## 2022-09-19 DIAGNOSIS — R059 Cough, unspecified: Secondary | ICD-10-CM | POA: Diagnosis not present

## 2022-09-26 DIAGNOSIS — D2271 Melanocytic nevi of right lower limb, including hip: Secondary | ICD-10-CM | POA: Diagnosis not present

## 2022-09-26 DIAGNOSIS — D1721 Benign lipomatous neoplasm of skin and subcutaneous tissue of right arm: Secondary | ICD-10-CM | POA: Diagnosis not present

## 2022-09-26 DIAGNOSIS — D2371 Other benign neoplasm of skin of right lower limb, including hip: Secondary | ICD-10-CM | POA: Diagnosis not present

## 2022-09-26 DIAGNOSIS — D485 Neoplasm of uncertain behavior of skin: Secondary | ICD-10-CM | POA: Diagnosis not present

## 2022-09-26 DIAGNOSIS — L918 Other hypertrophic disorders of the skin: Secondary | ICD-10-CM | POA: Diagnosis not present

## 2022-09-26 DIAGNOSIS — L820 Inflamed seborrheic keratosis: Secondary | ICD-10-CM | POA: Diagnosis not present

## 2022-09-26 DIAGNOSIS — L821 Other seborrheic keratosis: Secondary | ICD-10-CM | POA: Diagnosis not present

## 2022-09-28 DIAGNOSIS — M7061 Trochanteric bursitis, right hip: Secondary | ICD-10-CM | POA: Diagnosis not present

## 2022-09-28 DIAGNOSIS — M7601 Gluteal tendinitis, right hip: Secondary | ICD-10-CM | POA: Diagnosis not present

## 2022-10-05 DIAGNOSIS — M7061 Trochanteric bursitis, right hip: Secondary | ICD-10-CM | POA: Diagnosis not present

## 2022-10-05 DIAGNOSIS — M67951 Unspecified disorder of synovium and tendon, right thigh: Secondary | ICD-10-CM | POA: Diagnosis not present

## 2022-10-11 DIAGNOSIS — M67951 Unspecified disorder of synovium and tendon, right thigh: Secondary | ICD-10-CM | POA: Diagnosis not present

## 2022-10-11 DIAGNOSIS — M7061 Trochanteric bursitis, right hip: Secondary | ICD-10-CM | POA: Diagnosis not present

## 2022-10-16 DIAGNOSIS — R059 Cough, unspecified: Secondary | ICD-10-CM | POA: Diagnosis not present

## 2022-10-16 DIAGNOSIS — Z20822 Contact with and (suspected) exposure to covid-19: Secondary | ICD-10-CM | POA: Diagnosis not present

## 2022-10-18 DIAGNOSIS — R059 Cough, unspecified: Secondary | ICD-10-CM | POA: Diagnosis not present

## 2022-10-18 DIAGNOSIS — Z20822 Contact with and (suspected) exposure to covid-19: Secondary | ICD-10-CM | POA: Diagnosis not present

## 2022-10-22 ENCOUNTER — Other Ambulatory Visit: Payer: Self-pay | Admitting: Family Medicine

## 2022-10-23 DIAGNOSIS — E669 Obesity, unspecified: Secondary | ICD-10-CM | POA: Diagnosis not present

## 2022-10-23 DIAGNOSIS — Z86010 Personal history of colonic polyps: Secondary | ICD-10-CM | POA: Diagnosis not present

## 2022-10-23 DIAGNOSIS — K219 Gastro-esophageal reflux disease without esophagitis: Secondary | ICD-10-CM | POA: Diagnosis not present

## 2022-10-23 DIAGNOSIS — K5900 Constipation, unspecified: Secondary | ICD-10-CM | POA: Diagnosis not present

## 2022-11-07 DIAGNOSIS — R350 Frequency of micturition: Secondary | ICD-10-CM | POA: Diagnosis not present

## 2022-11-07 DIAGNOSIS — N3941 Urge incontinence: Secondary | ICD-10-CM | POA: Diagnosis not present

## 2022-11-14 DIAGNOSIS — M25551 Pain in right hip: Secondary | ICD-10-CM | POA: Diagnosis not present

## 2022-11-14 DIAGNOSIS — M7061 Trochanteric bursitis, right hip: Secondary | ICD-10-CM | POA: Diagnosis not present

## 2022-11-14 DIAGNOSIS — R293 Abnormal posture: Secondary | ICD-10-CM | POA: Diagnosis not present

## 2022-11-14 DIAGNOSIS — R262 Difficulty in walking, not elsewhere classified: Secondary | ICD-10-CM | POA: Diagnosis not present

## 2022-11-14 DIAGNOSIS — M6281 Muscle weakness (generalized): Secondary | ICD-10-CM | POA: Diagnosis not present

## 2022-11-15 DIAGNOSIS — R293 Abnormal posture: Secondary | ICD-10-CM | POA: Diagnosis not present

## 2022-11-15 DIAGNOSIS — M7061 Trochanteric bursitis, right hip: Secondary | ICD-10-CM | POA: Diagnosis not present

## 2022-11-15 DIAGNOSIS — M25551 Pain in right hip: Secondary | ICD-10-CM | POA: Diagnosis not present

## 2022-11-15 DIAGNOSIS — R262 Difficulty in walking, not elsewhere classified: Secondary | ICD-10-CM | POA: Diagnosis not present

## 2022-11-15 DIAGNOSIS — M6281 Muscle weakness (generalized): Secondary | ICD-10-CM | POA: Diagnosis not present

## 2022-11-20 DIAGNOSIS — R293 Abnormal posture: Secondary | ICD-10-CM | POA: Diagnosis not present

## 2022-11-20 DIAGNOSIS — R262 Difficulty in walking, not elsewhere classified: Secondary | ICD-10-CM | POA: Diagnosis not present

## 2022-11-20 DIAGNOSIS — M25551 Pain in right hip: Secondary | ICD-10-CM | POA: Diagnosis not present

## 2022-11-20 DIAGNOSIS — M7061 Trochanteric bursitis, right hip: Secondary | ICD-10-CM | POA: Diagnosis not present

## 2022-11-20 DIAGNOSIS — M6281 Muscle weakness (generalized): Secondary | ICD-10-CM | POA: Diagnosis not present

## 2022-11-26 DIAGNOSIS — M7061 Trochanteric bursitis, right hip: Secondary | ICD-10-CM | POA: Diagnosis not present

## 2022-11-26 DIAGNOSIS — M6281 Muscle weakness (generalized): Secondary | ICD-10-CM | POA: Diagnosis not present

## 2022-11-26 DIAGNOSIS — R262 Difficulty in walking, not elsewhere classified: Secondary | ICD-10-CM | POA: Diagnosis not present

## 2022-11-26 DIAGNOSIS — R293 Abnormal posture: Secondary | ICD-10-CM | POA: Diagnosis not present

## 2022-11-26 DIAGNOSIS — M25551 Pain in right hip: Secondary | ICD-10-CM | POA: Diagnosis not present

## 2022-11-30 DIAGNOSIS — M25551 Pain in right hip: Secondary | ICD-10-CM | POA: Diagnosis not present

## 2022-11-30 DIAGNOSIS — R293 Abnormal posture: Secondary | ICD-10-CM | POA: Diagnosis not present

## 2022-11-30 DIAGNOSIS — M6281 Muscle weakness (generalized): Secondary | ICD-10-CM | POA: Diagnosis not present

## 2022-11-30 DIAGNOSIS — M7061 Trochanteric bursitis, right hip: Secondary | ICD-10-CM | POA: Diagnosis not present

## 2022-11-30 DIAGNOSIS — R262 Difficulty in walking, not elsewhere classified: Secondary | ICD-10-CM | POA: Diagnosis not present

## 2022-12-02 DIAGNOSIS — R059 Cough, unspecified: Secondary | ICD-10-CM | POA: Diagnosis not present

## 2022-12-02 DIAGNOSIS — J00 Acute nasopharyngitis [common cold]: Secondary | ICD-10-CM | POA: Diagnosis not present

## 2022-12-02 DIAGNOSIS — Z6833 Body mass index (BMI) 33.0-33.9, adult: Secondary | ICD-10-CM | POA: Diagnosis not present

## 2022-12-03 DIAGNOSIS — R293 Abnormal posture: Secondary | ICD-10-CM | POA: Diagnosis not present

## 2022-12-03 DIAGNOSIS — M7061 Trochanteric bursitis, right hip: Secondary | ICD-10-CM | POA: Diagnosis not present

## 2022-12-03 DIAGNOSIS — R262 Difficulty in walking, not elsewhere classified: Secondary | ICD-10-CM | POA: Diagnosis not present

## 2022-12-03 DIAGNOSIS — M25551 Pain in right hip: Secondary | ICD-10-CM | POA: Diagnosis not present

## 2022-12-03 DIAGNOSIS — M6281 Muscle weakness (generalized): Secondary | ICD-10-CM | POA: Diagnosis not present

## 2022-12-06 DIAGNOSIS — R293 Abnormal posture: Secondary | ICD-10-CM | POA: Diagnosis not present

## 2022-12-06 DIAGNOSIS — M6281 Muscle weakness (generalized): Secondary | ICD-10-CM | POA: Diagnosis not present

## 2022-12-06 DIAGNOSIS — R262 Difficulty in walking, not elsewhere classified: Secondary | ICD-10-CM | POA: Diagnosis not present

## 2022-12-06 DIAGNOSIS — M25551 Pain in right hip: Secondary | ICD-10-CM | POA: Diagnosis not present

## 2022-12-06 DIAGNOSIS — M7061 Trochanteric bursitis, right hip: Secondary | ICD-10-CM | POA: Diagnosis not present

## 2022-12-13 DIAGNOSIS — M25551 Pain in right hip: Secondary | ICD-10-CM | POA: Diagnosis not present

## 2022-12-13 DIAGNOSIS — R293 Abnormal posture: Secondary | ICD-10-CM | POA: Diagnosis not present

## 2022-12-13 DIAGNOSIS — R262 Difficulty in walking, not elsewhere classified: Secondary | ICD-10-CM | POA: Diagnosis not present

## 2022-12-13 DIAGNOSIS — M6281 Muscle weakness (generalized): Secondary | ICD-10-CM | POA: Diagnosis not present

## 2022-12-13 DIAGNOSIS — M7061 Trochanteric bursitis, right hip: Secondary | ICD-10-CM | POA: Diagnosis not present

## 2022-12-14 DIAGNOSIS — M7061 Trochanteric bursitis, right hip: Secondary | ICD-10-CM | POA: Diagnosis not present

## 2022-12-14 DIAGNOSIS — M67951 Unspecified disorder of synovium and tendon, right thigh: Secondary | ICD-10-CM | POA: Diagnosis not present

## 2022-12-19 DIAGNOSIS — M7061 Trochanteric bursitis, right hip: Secondary | ICD-10-CM | POA: Diagnosis not present

## 2022-12-19 DIAGNOSIS — R293 Abnormal posture: Secondary | ICD-10-CM | POA: Diagnosis not present

## 2022-12-19 DIAGNOSIS — M25551 Pain in right hip: Secondary | ICD-10-CM | POA: Diagnosis not present

## 2022-12-19 DIAGNOSIS — M6281 Muscle weakness (generalized): Secondary | ICD-10-CM | POA: Diagnosis not present

## 2022-12-19 DIAGNOSIS — R262 Difficulty in walking, not elsewhere classified: Secondary | ICD-10-CM | POA: Diagnosis not present

## 2022-12-21 DIAGNOSIS — R293 Abnormal posture: Secondary | ICD-10-CM | POA: Diagnosis not present

## 2022-12-21 DIAGNOSIS — M6281 Muscle weakness (generalized): Secondary | ICD-10-CM | POA: Diagnosis not present

## 2022-12-21 DIAGNOSIS — M25551 Pain in right hip: Secondary | ICD-10-CM | POA: Diagnosis not present

## 2022-12-21 DIAGNOSIS — M7061 Trochanteric bursitis, right hip: Secondary | ICD-10-CM | POA: Diagnosis not present

## 2022-12-21 DIAGNOSIS — R262 Difficulty in walking, not elsewhere classified: Secondary | ICD-10-CM | POA: Diagnosis not present

## 2022-12-24 ENCOUNTER — Encounter: Payer: Self-pay | Admitting: Family Medicine

## 2022-12-24 ENCOUNTER — Ambulatory Visit (INDEPENDENT_AMBULATORY_CARE_PROVIDER_SITE_OTHER): Payer: Medicare Other | Admitting: Family Medicine

## 2022-12-24 VITALS — BP 130/82 | HR 64 | Temp 98.0°F | Ht 63.0 in | Wt 195.6 lb

## 2022-12-24 DIAGNOSIS — E782 Mixed hyperlipidemia: Secondary | ICD-10-CM | POA: Diagnosis not present

## 2022-12-24 DIAGNOSIS — I10 Essential (primary) hypertension: Secondary | ICD-10-CM | POA: Diagnosis not present

## 2022-12-24 DIAGNOSIS — Z79899 Other long term (current) drug therapy: Secondary | ICD-10-CM | POA: Diagnosis not present

## 2022-12-24 DIAGNOSIS — F321 Major depressive disorder, single episode, moderate: Secondary | ICD-10-CM

## 2022-12-24 DIAGNOSIS — R053 Chronic cough: Secondary | ICD-10-CM

## 2022-12-24 DIAGNOSIS — K219 Gastro-esophageal reflux disease without esophagitis: Secondary | ICD-10-CM

## 2022-12-24 DIAGNOSIS — N3281 Overactive bladder: Secondary | ICD-10-CM

## 2022-12-24 DIAGNOSIS — Z1231 Encounter for screening mammogram for malignant neoplasm of breast: Secondary | ICD-10-CM | POA: Diagnosis not present

## 2022-12-24 LAB — LIPID PANEL
Cholesterol: 151 mg/dL (ref 0–200)
HDL: 55 mg/dL (ref >39.00)
LDL Cholesterol: 81 mg/dL (ref 0–99)
NonHDL: 96.24
Total CHOL/HDL Ratio: 3
Triglycerides: 78 mg/dL (ref 0.0–149.0)
VLDL: 15.6 mg/dL (ref 0.0–40.0)

## 2022-12-24 LAB — COMPREHENSIVE METABOLIC PANEL
ALT: 16 U/L (ref 0–35)
AST: 15 U/L (ref 0–37)
Albumin: 4 g/dL (ref 3.5–5.2)
Alkaline Phosphatase: 86 U/L (ref 39–117)
BUN: 19 mg/dL (ref 6–23)
CO2: 28 mEq/L (ref 19–32)
Calcium: 9.5 mg/dL (ref 8.4–10.5)
Chloride: 106 mEq/L (ref 96–112)
Creatinine, Ser: 1.02 mg/dL (ref 0.40–1.20)
GFR: 55.17 mL/min — ABNORMAL LOW (ref >60.00)
Glucose, Bld: 96 mg/dL (ref 70–99)
Potassium: 4.4 mEq/L (ref 3.5–5.1)
Sodium: 138 mEq/L (ref 135–145)
Total Bilirubin: 0.9 mg/dL (ref 0.2–1.2)
Total Protein: 6.4 g/dL (ref 6.0–8.3)

## 2022-12-24 LAB — CBC WITH DIFFERENTIAL/PLATELET
Basophils Absolute: 0.1 10*3/uL (ref 0.0–0.1)
Basophils Relative: 1.3 % (ref 0.0–3.0)
Eosinophils Absolute: 0.2 10*3/uL (ref 0.0–0.7)
Eosinophils Relative: 4.2 % (ref 0.0–5.0)
HCT: 40 % (ref 36.0–46.0)
Hemoglobin: 13.6 g/dL (ref 12.0–15.0)
Lymphocytes Relative: 33 % (ref 12.0–46.0)
Lymphs Abs: 1.5 10*3/uL (ref 0.7–4.0)
MCHC: 33.9 g/dL (ref 30.0–36.0)
MCV: 93.2 fl (ref 78.0–100.0)
Monocytes Absolute: 0.4 10*3/uL (ref 0.1–1.0)
Monocytes Relative: 9.1 % (ref 3.0–12.0)
Neutro Abs: 2.3 10*3/uL (ref 1.4–7.7)
Neutrophils Relative %: 52.4 % (ref 43.0–77.0)
Platelets: 242 10*3/uL (ref 150.0–400.0)
RBC: 4.29 Mil/uL (ref 3.87–5.11)
RDW: 14.3 % (ref 11.5–15.5)
WBC: 4.5 10*3/uL (ref 4.0–10.5)

## 2022-12-24 LAB — HM MAMMOGRAPHY

## 2022-12-24 LAB — TSH: TSH: 2.75 u[IU]/mL (ref 0.35–5.50)

## 2022-12-24 MED ORDER — CARVEDILOL 25 MG PO TABS: ORAL_TABLET | ORAL | 3 refills | Status: DC

## 2022-12-24 MED ORDER — HYDROCHLOROTHIAZIDE 12.5 MG PO CAPS: 12.5000 mg | ORAL_CAPSULE | Freq: Every day | ORAL | 3 refills | Status: DC

## 2022-12-24 NOTE — Progress Notes (Signed)
Subjective  Chief Complaint  Patient presents with   Annual Exam    Pt here for Annual Exam and is currently fasting. Mammo is scheduled for this afternoon.     HPI: Erin West is a 72 y.o. female who presents to [facility] at [place] today for a Female Wellness Visit. She also has the concerns and/or needs as listed above in the chief complaint. These will be addressed in addition to the Health Maintenance Visit.   Wellness Visit: annual visit with health maintenance review and exam without Pap  HM: Patient has mammogram scheduled for today.  Colon cancer screening and bone density screens are current.  She has stable osteopenia.  Overall doing well.  Immunizations are current. Chronic disease f/u and/or acute problem visit: (deemed necessary to be done in addition to the wellness visit): Hypertension is fairly well-controlled on Coreg 25 mg twice daily.  Diastolics remain in the low 80s.  She denies chest pain, palpitations, fatigue, shortness of breath or lower extremity edema.  She is compliant with medications. Chronic cough due to GERD, LPR, allergies: Takes PPI, H2 blocker and allergy medications.  Currently active.  Had attic insulated few weeks ago and thinks particles probably have worsened her cough.  No fevers or chills.  No pleuritic chest pain.  Still with heartburn at night intermittently.  Does see GI. Reviewed chronic medications: Vesicare for overactive bladder is stable.  Uses Xanax for sleep.  Tolerating Crestor for hyperlipidemia and is due for recheck today, fasting. Review of systems is negative for fatigue, abdominal pain, melena.  Has mild hand pain intermittently from OA.  Going to physical therapy for hip pain managed by Ortho.  Assessment  1. Essential hypertension   2. Chronic cough   3. Mixed hyperlipidemia   4. Depression, major, single episode, moderate   5. Chronic prescription benzodiazepine use   6. Gastroesophageal reflux disease  without esophagitis   7. Laryngopharyngeal reflux (LPR)   8. OAB (overactive bladder)      Plan  Female Wellness Visit: Age appropriate Health Maintenance and Prevention measures were discussed with patient. Included topics are cancer screening recommendations, ways to keep healthy (see AVS) including dietary and exercise recommendations, regular eye and dental care, use of seat belts, and avoidance of moderate alcohol use and tobacco use.  Mammo today BMI: discussed patient's BMI and encouraged positive lifestyle modifications to help get to or maintain a target BMI. HM needs and immunizations were addressed and ordered. See below for orders. See HM and immunization section for updates. Routine labs and screening tests ordered including cmp, cbc and lipids where appropriate. Discussed recommendations regarding Vit D and calcium supplementation (see AVS)  Chronic disease management visit and/or acute problem visit: Hyperlipidemia with fasting recheck on Crestor 20 nightly.  Tolerating well.  Check LFTs Hypertension with fairly good control.  Add Microzide 12.5 mg daily to Coreg 25 mg twice daily for better blood pressure control.  Low-salt diet recommended.  Recheck electrolytes and renal function today.  Heart rate is controlled GERD LPR and chronic cough stable.  He has seen allergy and pulmonology.  Continue current medications including Pepcid and PPI. Overactive bladder: Continue Vesicare daily. Mood is stable.  Insomnia managed with Xanax nightly.  Follow up: 6 months for blood pressure recheck Orders Placed This Encounter  Procedures   CBC with Differential/Platelet   Comprehensive metabolic panel   Lipid panel   TSH   Meds ordered this encounter  Medications  hydrochlorothiazide (MICROZIDE) 12.5 MG capsule    Sig: Take 1 capsule (12.5 mg total) by mouth daily.    Dispense:  90 capsule    Refill:  3   carvedilol (COREG) 25 MG tablet    Sig: TAKE 1 TABLET(25 MG) BY MOUTH  TWICE DAILY    Dispense:  180 tablet    Refill:  3    **Patient requests 90 days supply**      Body mass index is 34.65 kg/m. Wt Readings from Last 3 Encounters:  12/24/22 195 lb 9.6 oz (88.7 kg)  08/21/22 190 lb (86.2 kg)  06/07/22 192 lb 12.8 oz (87.5 kg)     Patient Active Problem List   Diagnosis Date Noted   Chronic prescription benzodiazepine use 09/20/2021    Priority: High    Sleep and mood: grief/anxiety due to loss of husband. Declines SSRI  Education given    Depression, major, single episode, moderate 11/02/2020    Priority: High   Mixed hyperlipidemia 12/04/2017    Priority: High   Chronic cough 07/24/2017    Priority: High    Persistent cough.  Ongoing for almost a year now.  Has seen ENT, pulmonology, gastroenterology without any clear diagnosis.  She reports worsening of cough in the morning with a.m. hoarseness and drainage.  She does have allergies.  She has seen an allergist many many years ago.  On Flonase and Zyrtec nightly.  CT scan of the sinuses was negative for infection.  PFTs were normal.  Chest x-ray was normal.  She is on chronic high-dose PPI and has been evaluated for esophageal dysmotility by Dr. Collene Mares.  They have not found a cause for her persistent cough.  She is on losartan Pulmonary consult January 2019, PFT normal No change off ARB (losartan).      Essential hypertension     Priority: High    Overview:  Normal Stress Test 2012 by [facility]; ECHO EF 65% with mild diastolic dysfunction 99991111      Osteoarthritis of left glenohumeral joint 04/19/2020    Priority: Medium    OAB (overactive bladder) 10/28/2019    Priority: Medium    Chronic seasonal allergic rhinitis 12/04/2017    Priority: Medium    Chronic pansinusitis 09/09/2017    Priority: Medium    Laryngopharyngeal reflux (LPR) 09/09/2017    Priority: Medium    Bilateral leg edema 03/30/2014    Priority: Medium     Overview:  Normal labs; ECHO with EF 123456 and mild diastolic  dysfunction; doppler pending on left.  Lasix 40 bid to manage in high humidity. Does fine during winter. TED hose    History of gastritis 02/10/2014    Priority: Medium     Overview:  01/2014 dr. Collene Mares    Dysphagia 01/28/2014    Priority: Medium     Overview:  Dr. Collene Mares - esophagus dysmobility and reflux on barium swallow.  Last Assessment & Plan:  Patient is reporting some dysphasia and acid reflux. I recommended she increased her omeprazole to 20 mg twice daily and schedule follow-up with GI    Irritable bowel syndrome (IBS) 09/08/2013    Priority: Medium     Overview:  evaluation Dr. Collene Mares 2015 upper endoscopy with diffuse gastritis. Stopped nsaids.    Osteopenia 07/14/2013    Priority: Medium     DEXA 12/2020: stable osteopenia, T = -1.6 lowest femur; receck 2-3 years DEXA 11/2018: stable osteopenia, T = -1.5 lowest femer; recheck 2-3 years. DEXA 2014, mild osteopenia,  T = -1.56 lowest (almost normal); on calcium and vit D; + Fh of osteoporosis    Osteoarthritis, multiple sites 07/14/2013    Priority: Medium     Overview:  Followed by [facility]; MRI done in past - DJD and spurs. S/p knee replacement. Dr. Jaynee Eagles  Hand injection:  Injections 2018, Dr. Lindell Noe    Gastroesophageal reflux disease without esophagitis     Priority: Medium    Atrophic vaginitis 10/22/2018    Priority: Low   Status post total right knee replacement 01/24/2016    Priority: Low   Asthma 01/18/2022   Hemoptysis 01/18/2022   Health Maintenance  Topic Date Due   MAMMOGRAM  12/21/2022   Medicare Annual Wellness (AWV)  12/27/2022   COVID-19 Vaccine (6 - 2023-24 season) 01/09/2023 (Originally 05/25/2022)   INFLUENZA VACCINE  04/25/2023   DEXA SCAN  12/15/2023   DTaP/Tdap/Td (3 - Td or Tdap) 12/18/2025   COLONOSCOPY (Pts 45-27yrs Insurance coverage will need to be confirmed)  08/03/2026   Pneumonia Vaccine 71+ Years old  Completed   Hepatitis C Screening  Completed   Zoster Vaccines- Shingrix   Completed   HPV VACCINES  Aged Out   Immunization History  Administered Date(s) Administered   Fluad Quad(high Dose 65+) 07/15/2021, 06/07/2022   Influenza Split 06/24/2013   Influenza, High Dose Seasonal PF 05/23/2016, 06/28/2017, 06/12/2018, 05/17/2019   Influenza, Seasonal, Injecte, Preservative Fre 05/27/2014, 07/11/2015   Influenza,inj,quad, With Preservative 06/25/2019   PFIZER(Purple Top)SARS-COV-2 Vaccination 10/30/2019, 11/24/2019, 06/22/2020, 12/28/2020   Pfizer Covid-19 Vaccine Bivalent Booster 3yrs & up 07/15/2021   Pneumococcal Conjugate-13 10/29/2016   Pneumococcal Polysaccharide-23 07/25/2012, 11/06/2017, 05/17/2019   Tdap 09/24/2010, 12/19/2015   Zoster Recombinat (Shingrix) 08/09/2017, 11/06/2017   Zoster, Live 09/25/2011   We updated and reviewed the patient's past history in detail and it is documented below. Allergies: Patient is allergic to penicillins, sulfa antibiotics, sulfasalazine, tramadol, ciprofloxacin, trimethoprim, and isovue [iopamidol]. Past Medical History Patient  has a past medical history of Anxiety, Arthritis, Aspiration pneumonia (2013), Atypical chest pain, Diarrhea, Diastolic dysfunction, DVT (deep venous thrombosis), Dyslipidemia, GERD (gastroesophageal reflux disease), Hypertension, Osteoarthritis, Osteopenia, Palpitations, and Syncope. Past Surgical History Patient  has a past surgical history that includes Cholecystectomy (2006); Tubal ligation (06/1978); Replacement total knee (Left); Knee arthroscopy (Right); and Replacement total knee (Right). Family History: Patient family history includes Arthritis in her father and mother; Heart disease in her mother; Mental illness in her father; Osteoporosis in her mother; Other in her mother; Stroke in her paternal grandfather; Sudden death in her father; Thyroid disease in her father. Social History:  Patient  reports that she has never smoked. She has never used smokeless tobacco. She reports  current alcohol use. She reports that she does not use drugs.  Review of Systems: Constitutional: negative for fever or malaise Ophthalmic: negative for photophobia, double vision or loss of vision Cardiovascular: negative for chest pain, dyspnea on exertion, or new LE swelling Respiratory: negative for SOB or persistent cough Gastrointestinal: negative for abdominal pain, change in bowel habits or melena Genitourinary: negative for dysuria or gross hematuria, no abnormal uterine bleeding or disharge Musculoskeletal: negative for new gait disturbance or muscular weakness Integumentary: negative for new or persistent rashes, no breast lumps Neurological: negative for TIA or stroke symptoms Psychiatric: negative for SI or delusions Allergic/Immunologic: negative for hives  Patient Care Team    Relationship Specialty Notifications Start End  Leamon Arnt, MD PCP - General Family Medicine  03/19/16   Stanford Breed,  Denice Bors, MD PCP - Cardiology Cardiology  04/13/21   Donzetta Sprung., MD Consulting Physician Orthopedic Surgery  12/04/17   Daryll Brod, MD Consulting Physician Orthopedic Surgery  12/04/17   Juanita Craver, MD Consulting Physician Gastroenterology  12/04/17   Inocencio Homes, DPM Consulting Physician Podiatry  10/22/18   [name], MD Consulting Physician Cardiology  10/22/18   Gregery Na, NP  Nurse Practitioner  10/22/18   Bjorn Loser, MD Consulting Physician Urology  10/28/19   Tobe Sos, PA-C Consulting Physician Dermatology  10/29/19   [name], PA Physician Assistant Cardiology  04/02/22     Objective  Vitals: BP 130/82   Pulse 64   Temp 98 F (36.7 C)   Ht 5\' 3"  (1.6 m)   Wt 195 lb 9.6 oz (88.7 kg)   LMP 09/24/2012 (Approximate)   SpO2 96%   BMI 34.65 kg/m  General:  Well developed, well nourished, no acute distress  Psych:  Alert and orientedx3,normal mood and affect HEENT:  Normocephalic, atraumatic, non-icteric sclera,  supple neck  without adenopathy, mass or thyromegaly Cardiovascular:  Normal S1, S2, RRR without gallop, rub or murmur Respiratory:  Good breath sounds bilaterally, CTAB with normal respiratory effort Gastrointestinal: normal bowel sounds, soft, non-tender, no noted masses. No HSM MSK: OA changes bilateral hands.  Joints are without erythema or swelling.  Skin:  Warm, no rashes or suspicious lesions noted Neurologic:    Mental status is normal. Gross motor and sensory exams are normal. Normal gait. No tremor   Commons side effects, risks, benefits, and alternatives for medications and treatment plan prescribed today were discussed, and the patient expressed understanding of the given instructions. Patient is instructed to call or message via MyChart if he/she has any questions or concerns regarding our treatment plan. No barriers to understanding were identified. We discussed Red Flag symptoms and signs in detail. Patient expressed understanding regarding what to do in case of urgent or emergency type symptoms.  Medication list was reconciled, printed and provided to the patient in AVS. Patient instructions and summary information was reviewed with the patient as documented in the AVS. This note was prepared with assistance of Dragon voice recognition software. Occasional wrong-word or sound-a-like substitutions may have occurred due to the inherent limitations of voice recognition software

## 2022-12-24 NOTE — Patient Instructions (Signed)

## 2022-12-25 ENCOUNTER — Telehealth: Payer: Self-pay | Admitting: Family Medicine

## 2022-12-25 NOTE — Telephone Encounter (Signed)
Patient requesting call back in regards to labs . Call back number is [phone]

## 2022-12-28 DIAGNOSIS — M25551 Pain in right hip: Secondary | ICD-10-CM | POA: Diagnosis not present

## 2022-12-28 DIAGNOSIS — R262 Difficulty in walking, not elsewhere classified: Secondary | ICD-10-CM | POA: Diagnosis not present

## 2022-12-28 DIAGNOSIS — M7061 Trochanteric bursitis, right hip: Secondary | ICD-10-CM | POA: Diagnosis not present

## 2022-12-28 DIAGNOSIS — M6281 Muscle weakness (generalized): Secondary | ICD-10-CM | POA: Diagnosis not present

## 2022-12-28 DIAGNOSIS — R293 Abnormal posture: Secondary | ICD-10-CM | POA: Diagnosis not present

## 2023-01-02 ENCOUNTER — Other Ambulatory Visit (HOSPITAL_BASED_OUTPATIENT_CLINIC_OR_DEPARTMENT_OTHER): Payer: Self-pay

## 2023-01-02 DIAGNOSIS — Z23 Encounter for immunization: Secondary | ICD-10-CM | POA: Diagnosis not present

## 2023-01-02 MED ORDER — COMIRNATY 30 MCG/0.3ML IM SUSY
0.5000 mL | PREFILLED_SYRINGE | INTRAMUSCULAR | 0 refills | Status: DC
  Filled 2023-01-02: qty 0.3, 1d supply, fill #0

## 2023-01-07 ENCOUNTER — Other Ambulatory Visit: Payer: Self-pay | Admitting: Cardiology

## 2023-01-11 DIAGNOSIS — Z780 Asymptomatic menopausal state: Secondary | ICD-10-CM | POA: Diagnosis not present

## 2023-01-11 DIAGNOSIS — M8589 Other specified disorders of bone density and structure, multiple sites: Secondary | ICD-10-CM | POA: Diagnosis not present

## 2023-01-24 ENCOUNTER — Ambulatory Visit: Payer: Medicare Other | Admitting: Family Medicine

## 2023-01-29 ENCOUNTER — Ambulatory Visit (INDEPENDENT_AMBULATORY_CARE_PROVIDER_SITE_OTHER): Payer: Medicare Other | Admitting: Family Medicine

## 2023-01-29 ENCOUNTER — Encounter: Payer: Self-pay | Admitting: Family Medicine

## 2023-01-29 VITALS — BP 128/74 | HR 71 | Temp 98.0°F | Ht 63.0 in | Wt 193.0 lb

## 2023-01-29 DIAGNOSIS — I10 Essential (primary) hypertension: Secondary | ICD-10-CM

## 2023-01-29 DIAGNOSIS — R682 Dry mouth, unspecified: Secondary | ICD-10-CM | POA: Diagnosis not present

## 2023-01-29 DIAGNOSIS — R5383 Other fatigue: Secondary | ICD-10-CM

## 2023-01-29 LAB — BASIC METABOLIC PANEL
BUN: 17 mg/dL (ref 6–23)
CO2: 28 mEq/L (ref 19–32)
Calcium: 9.5 mg/dL (ref 8.4–10.5)
Chloride: 103 mEq/L (ref 96–112)
Creatinine, Ser: 0.94 mg/dL (ref 0.40–1.20)
GFR: 60.8 mL/min (ref >60.00)
Glucose, Bld: 98 mg/dL (ref 70–99)
Potassium: 4.5 mEq/L (ref 3.5–5.1)
Sodium: 138 mEq/L (ref 135–145)

## 2023-01-29 LAB — CBC WITH DIFFERENTIAL/PLATELET
Basophils Absolute: 0.1 10*3/uL (ref 0.0–0.1)
Basophils Relative: 1.3 % (ref 0.0–3.0)
Eosinophils Absolute: 0.1 10*3/uL (ref 0.0–0.7)
Eosinophils Relative: 3.2 % (ref 0.0–5.0)
HCT: 40.8 % (ref 36.0–46.0)
Hemoglobin: 14 g/dL (ref 12.0–15.0)
Lymphocytes Relative: 30.6 % (ref 12.0–46.0)
Lymphs Abs: 1.3 10*3/uL (ref 0.7–4.0)
MCHC: 34.2 g/dL (ref 30.0–36.0)
MCV: 92.9 fl (ref 78.0–100.0)
Monocytes Absolute: 0.4 10*3/uL (ref 0.1–1.0)
Monocytes Relative: 9.3 % (ref 3.0–12.0)
Neutro Abs: 2.3 10*3/uL (ref 1.4–7.7)
Neutrophils Relative %: 55.6 % (ref 43.0–77.0)
Platelets: 233 10*3/uL (ref 150.0–400.0)
RBC: 4.39 Mil/uL (ref 3.87–5.11)
RDW: 13.4 % (ref 11.5–15.5)
WBC: 4.1 10*3/uL (ref 4.0–10.5)

## 2023-01-29 LAB — POCT URINALYSIS DIPSTICK
Bilirubin, UA: NEGATIVE
Blood, UA: NEGATIVE
Glucose, UA: NEGATIVE
Ketones, UA: NEGATIVE
Leukocytes, UA: NEGATIVE
Nitrite, UA: NEGATIVE
Protein, UA: NEGATIVE
Spec Grav, UA: 1.015 (ref 1.010–1.025)
Urobilinogen, UA: 0.2 E.U./dL — AB
pH, UA: 6 (ref 5.0–8.0)

## 2023-01-29 NOTE — Progress Notes (Signed)
Subjective  CC:  Chief Complaint  Patient presents with   Fatigue    Pt stated that she has been feeling very fatigue over the past 2 weeks. It comes and goes but mostly in the afternoons and her eyes feels blurred    HPI: Erin West is a 72 y.o. female who presents to the office today to address the problems listed above in the chief complaint. 72 year old female with multiple medical problems as listed below in her problem list presents for 2-week history of feeling tired in the early afternoon.  Started abruptly.  Feels tired, lies down, when she awakens she feels back to her normal self.  Also complains of mild blurred vision and dry mouth.  She denies polyuria, irritative urinary symptoms, headaches, mood symptoms, double vision, chest pain, palpitations, shortness of breath or dyspnea on exertion, GI problems, melena.  No new medications.  She has no idea what could be contributing to the symptoms.  No new activities, no new food.  Reports that she sleeps well at night.  No mood worsening.  We reviewed recent labs from early April showing normal TSH, CBC, chemistry panel.  She is on a beta-blocker and Zyrtec but these are chronic and long-term meds. Blood pressure is elevated today.  However she has been checking daily and it has been averaging normal at home.  Assessment  1. Fatigue, unspecified type   2. Dry mouth   3. Essential hypertension      Plan  Fatigue: Only occurs in the afternoon.  Unclear etiology.  Will check screening labs and urine test.  Patient to monitor and follow-up if worsening or develops new symptoms. Hypertension: Continue to monitor at home.  No change in medications today  Follow up: As scheduled 07/01/2023  Orders Placed This Encounter  Procedures   Urine Culture   CBC with Differential/Platelet   Basic metabolic panel   POCT urinalysis dipstick   No orders of the defined types were placed in this encounter.     I reviewed the patients  updated PMH, FH, and SocHx.    Patient Active Problem List   Diagnosis Date Noted   Chronic prescription benzodiazepine use 09/20/2021    Priority: High   Depression, major, single episode, moderate (HCC) 11/02/2020    Priority: High   Mixed hyperlipidemia 12/04/2017    Priority: High   Chronic cough 07/24/2017    Priority: High   Essential hypertension     Priority: High   Osteoarthritis of left glenohumeral joint 04/19/2020    Priority: Medium    OAB (overactive bladder) 10/28/2019    Priority: Medium    Chronic seasonal allergic rhinitis 12/04/2017    Priority: Medium    Chronic pansinusitis 09/09/2017    Priority: Medium    Laryngopharyngeal reflux (LPR) 09/09/2017    Priority: Medium    Bilateral leg edema 03/30/2014    Priority: Medium    History of gastritis 02/10/2014    Priority: Medium    Dysphagia 01/28/2014    Priority: Medium    Irritable bowel syndrome (IBS) 09/08/2013    Priority: Medium    Osteopenia 07/14/2013    Priority: Medium    Osteoarthritis, multiple sites 07/14/2013    Priority: Medium    Gastroesophageal reflux disease without esophagitis     Priority: Medium    Atrophic vaginitis 10/22/2018    Priority: Low   Status post total right knee replacement 01/24/2016    Priority: Low   Asthma 01/18/2022  Hemoptysis 01/18/2022   Current Meds  Medication Sig   acetaminophen (TYLENOL) 650 MG CR tablet Take 650 mg by mouth every 8 (eight) hours as needed for pain.   albuterol (VENTOLIN HFA) 108 (90 Base) MCG/ACT inhaler Inhale 2 puffs into the lungs every 4 (four) hours as needed for wheezing or shortness of breath.   ALPRAZolam (XANAX) 0.5 MG tablet Take 1 tablet (0.5 mg total) by mouth at bedtime.   atorvastatin (LIPITOR) 20 MG tablet TAKE 1 TABLET AT BEDTIME   carvedilol (COREG) 25 MG tablet TAKE 1 TABLET TWICE A DAY   cetirizine (ZYRTEC) 10 MG tablet TAKE 1 TABLET DAILY   Cholecalciferol (VITAMIN D) 2000 UNITS CAPS Take 2,000 Units by mouth  daily.    diclofenac Sodium (VOLTAREN) 1 % GEL Apply 2 g topically 4 (four) times daily as needed (arthritis pain, hands).   famotidine (PEPCID) 40 MG tablet Take 40 mg by mouth 2 (two) times daily.   fluticasone (FLONASE) 50 MCG/ACT nasal spray Place 2 sprays into both nostrils daily.   hydrochlorothiazide (MICROZIDE) 12.5 MG capsule Take 1 capsule (12.5 mg total) by mouth daily.   ipratropium (ATROVENT) 0.03 % nasal spray Place 2 sprays into both nostrils 2 (two) times daily as needed for rhinitis.   LINZESS 290 MCG CAPS capsule Take 290 mcg by mouth daily.   montelukast (SINGULAIR) 10 MG tablet TAKE 1 TABLET AT BEDTIME   Multiple Vitamin (MULTIVITAMIN) tablet Take 1 tablet by mouth daily.   pantoprazole (PROTONIX) 40 MG tablet Take 40 mg by mouth daily.   polyethylene glycol powder (GLYCOLAX/MIRALAX) powder Take 17 g by mouth daily as needed.   Probiotic Product (PROBIOTIC PO) Take 1 capsule by mouth daily.    solifenacin (VESICARE) 5 MG tablet Take 5 mg by mouth daily.   [DISCONTINUED] COVID-19 mRNA vaccine 2023-2024 (COMIRNATY) syringe Inject 0.5 mLs into the muscle.    Allergies: Patient is allergic to penicillins, sulfa antibiotics, sulfasalazine, tramadol, ciprofloxacin, trimethoprim, and isovue [iopamidol]. Family History: Patient family history includes Arthritis in her father and mother; Heart disease in her mother; Mental illness in her father; Osteoporosis in her mother; Other in her mother; Stroke in her paternal grandfather; Sudden death in her father; Thyroid disease in her father. Social History:  Patient  reports that she has never smoked. She has never used smokeless tobacco. She reports current alcohol use. She reports that she does not use drugs.  Review of Systems: Constitutional: Negative for fever malaise or anorexia Cardiovascular: negative for chest pain Respiratory: negative for SOB or persistent cough Gastrointestinal: negative for abdominal pain  Objective   Vitals: BP 128/74 Comment: By average home reading over the last 2 weeks  Pulse 71   Temp 98 F (36.7 C)   Ht 5\' 3"  (1.6 m)   Wt 193 lb (87.5 kg)   LMP 09/24/2012 (Approximate)   SpO2 97%   BMI 34.19 kg/m  General: no acute distress , A&Ox3, appears well HEENT: PEERL, conjunctiva normal, neck is supple, no cervical lymphadenopathy Cardiovascular:  RRR without murmur or gallop.  No edema Respiratory:  Good breath sounds bilaterally, CTAB with normal respiratory effort Benign abdomen Skin:  Warm, no rashes  Lab Results  Component Value Date   WBC 4.5 12/24/2022   HGB 13.6 12/24/2022   HCT 40.0 12/24/2022   MCV 93.2 12/24/2022   PLT 242.0 12/24/2022   Lab Results  Component Value Date   CREATININE 1.02 12/24/2022   BUN 19 12/24/2022   NA 138  12/24/2022   K 4.4 12/24/2022   CL 106 12/24/2022   CO2 28 12/24/2022   Lab Results  Component Value Date   TSH 2.75 12/24/2022    Commons side effects, risks, benefits, and alternatives for medications and treatment plan prescribed today were discussed, and the patient expressed understanding of the given instructions. Patient is instructed to call or message via MyChart if he/she has any questions or concerns regarding our treatment plan. No barriers to understanding were identified. We discussed Red Flag symptoms and signs in detail. Patient expressed understanding regarding what to do in case of urgent or emergency type symptoms.  Medication list was reconciled, printed and provided to the patient in AVS. Patient instructions and summary information was reviewed with the patient as documented in the AVS. This note was prepared with assistance of Dragon voice recognition software. Occasional wrong-word or sound-a-like substitutions may have occurred due to the inherent limitations of voice recognition software

## 2023-01-29 NOTE — Patient Instructions (Signed)
Please follow up if symptoms do not improve or as needed.    I will see if your lab work or urine testing shows any cause for your sleepiness.  Otherwise we will have to monitor.  Hopefully will improve.

## 2023-01-30 LAB — URINE CULTURE
MICRO NUMBER:: 14923976
Result:: NO GROWTH
SPECIMEN QUALITY:: ADEQUATE

## 2023-02-14 ENCOUNTER — Encounter: Payer: Self-pay | Admitting: Pulmonary Disease

## 2023-02-14 ENCOUNTER — Ambulatory Visit (INDEPENDENT_AMBULATORY_CARE_PROVIDER_SITE_OTHER): Payer: Medicare Other | Admitting: Pulmonary Disease

## 2023-02-14 VITALS — BP 112/68 | HR 70 | Temp 98.2°F | Ht 63.0 in | Wt 196.4 lb

## 2023-02-14 DIAGNOSIS — R0982 Postnasal drip: Secondary | ICD-10-CM | POA: Diagnosis not present

## 2023-02-14 DIAGNOSIS — R053 Chronic cough: Secondary | ICD-10-CM | POA: Diagnosis not present

## 2023-02-14 DIAGNOSIS — K219 Gastro-esophageal reflux disease without esophagitis: Secondary | ICD-10-CM

## 2023-02-14 NOTE — Progress Notes (Signed)
Synopsis: Referred in June 2022 for Bronchitis by Felix Pacini, NP  Subjective:   PATIENT ID: Erin West GENDER: female DOB: 02/22/1951, MRN: [id number]  HPI  Chief Complaint  Patient presents with   Follow-up    Persistent dry throat.  Still spitting up pink saliva    Erin West is a 72 year old woman, never smoker with history of GERD, hypertension and DVT who returns to pulmonary clinic for cough.   No change in her cough symptoms after stopping her advair inhaler. She continues to have intermittent cough with pinkish phlegm. She has intermittent dry throat. She continues to have issues with GERD.   OV 08/21/22 Her cough remains the same since last visit and is intermittently coughing pinkish phlegm. She did not notice improvement with Zpak course after last visit. She continues to have issues with GERD.  OV 02/21/22 She was seen in acute visit by Rubye Oaks, NP on 01/18/22 for cough with thick mucous that is occasional pink tinged. No frank hemoptysis noted. D-dimer testing was elevated so a V/Q scan was ordered which was negative for pulmonary emboli.   She had colonoscopy and EGD 2 weeks ago by Dr. Loreta Ave. They removed 2 polyps during the colonoscopy. She continues to have significant GERD symptoms despite sleeping with head of bed elevated, protonix and famotidine therapy.  She is using netty pot 2-3 times per week for sinus congestion. She denies any fevers, chills or sweats. She has lost 8-9lbs since February.   OV 12/19/21 She has been experiencing shortness of breath and wheezing past 2 weeks and she reports having COVID in February of this year.  Her cough was worse when she was sick with COVID but then the cough got better for a couple of weeks but has since returned and has been bothering her.  She is still having issues with GERD despite taking PPI 30 minutes before breakfast and Pepcid twice daily along with sleeping with the head of her bed elevated.  She  also reports postnasal drainage since last week and is using a Nettie pot.  She continues to take Zyrtec daily and Singulair daily.  She is using Advair 2 puffs twice daily.  She is not currently using any nasal sprays.  She was seen by ENT 05/2021 where flexible laryngoscopy showed laryngopharyngeal reflux.  OV 05/17/21 She reports improvement in her cough with starting inhaler therapy with advair 115-54mcg 2 puffs twice daily since last visit along with fluticasone and ipratropium nasal sprays for post nasal drainage. She rates her cough at a level 4 currently where is was previously an 8-9 (10 being the worst). She continues to have issues with GERD despite using pantoprazole 40mg  daily and elevated the head of her bed at night.   We discussed her CT Chest scan over the phone. She has multiple subcentimeter nodules that will require follow up in the future. 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   She has referral to ENT clinic for evaluation of the cough and is awaiting to schedule this visit.  OV 02/2021 She reports having 2 episodes of bronchitis per year since 2018 which include prolonged symptoms of wheezing, cough and shortness of breath.  Her most recent episode was last month where she received an albuterol inhaler, azithromycin and a course of prednisone which helped her symptoms resolved.  She reports the cough is mainly dry.  The albuterol does help somewhat with the wheezing.  She does have episodes of sinus congestion and postnasal drainage when she is acutely ill with these episodes.  She also complains of bad heartburn/reflux symptoms and does note that her reflux symptoms were increased prior to her last episodes of bronchitis.  She denies any seasonal allergy symptoms or worsening of cough or wheezing with cold air or really hot weather.  She is currently taking pantoprazole 40 mg daily for her GERD.  This was recently increased about 2  months ago from 20 mg daily.  She mainly sleeps flat at night on her sides but is unable to lay flat on her back.  She does have a height adjustable bed frame but does not sleep with the head of the bed elevated at this time.  She does report some nasal congestion and postnasal drainage.  She is currently using fluticasone nasal spray 2 sprays per nostril daily.  Past Medical History:  Diagnosis Date   Anxiety    Arthritis    Aspiration pneumonia (HCC) 2013   Atypical chest pain    Stress test 04/21/10 - post-stress EF=89%. Normal scan.   Diarrhea    Diastolic dysfunction    But normal LV function on ECHO 2011 and low risk Myoview in 2011   DVT (deep venous thrombosis) (HCC)    Dyslipidemia    GERD (gastroesophageal reflux disease)    Hypertension    Osteoarthritis    Osteopenia    Palpitations    [employer] MCT Monitor 04/16/10-04/22/10    Syncope    Pain-mediated syncope     Family History  Problem Relation Age of Onset   Heart disease Mother    Other Mother        leg amputation   Arthritis Mother    Osteoporosis Mother    Mental illness Father    Sudden death Father    Arthritis Father    Thyroid disease Father    Stroke Paternal Grandfather      Social History   Socioeconomic History   Marital status: Widowed    Spouse name: Not on file   Number of children: 3   Years of education: Not on file   Highest education level: Not on file  Occupational History   Not on file  Tobacco Use   Smoking status: Never   Smokeless tobacco: Never  Vaping Use   Vaping Use: Never used  Substance and Sexual Activity   Alcohol use: Yes    Alcohol/week: 0.0 standard drinks of alcohol    Comment: 2-3/wine per month   Drug use: No   Sexual activity: Not Currently    Comment: 1st intercourse 72 yo-Fewer than 5 partners  Other Topics Concern   Not on file  Social History Narrative   Not on file   Social Determinants of Health   Financial Resource Strain: Low Risk   (12/26/2021)   Overall Financial Resource Strain (CARDIA)    Difficulty of Paying Living Expenses: Not hard at all  Food Insecurity: No Food Insecurity (12/26/2021)   Hunger Vital Sign    Worried About Running Out of Food in the Last Year: Never true    Ran Out of Food in the Last Year: Never true  Transportation Needs: No Transportation Needs (12/26/2021)   PRAPARE - Administrator, Civil Service (Medical): No    Lack of Transportation (Non-Medical): No  Physical Activity: Sufficiently Active (12/26/2021)   Exercise Vital Sign    Days of Exercise per Week: 4 days  Minutes of Exercise per Session: 60 min  Stress: No Stress Concern Present (12/26/2021)   [employer] of Occupational Health - Occupational Stress Questionnaire    Feeling of Stress : Only a little  Social Connections: Moderately Isolated (12/26/2021)   Social Connection and Isolation Panel [NHANES]    Frequency of Communication with Friends and Family: More than three times a week    Frequency of Social Gatherings with Friends and Family: More than three times a week    Attends Religious Services: 1 to 4 times per year    Active Member of [employer] or Organizations: No    Attends Banker Meetings: Never    Marital Status: Widowed  Intimate Partner Violence: Not At Risk (12/26/2021)   Humiliation, Afraid, Rape, and Kick questionnaire    Fear of Current or Ex-Partner: No    Emotionally Abused: No    Physically Abused: No    Sexually Abused: No     Allergies  Allergen Reactions   Penicillins Swelling and Rash    Has patient had a PCN reaction causing immediate rash, facial/tongue/throat swelling, SOB or lightheadedness with hypotension: No Has patient had a PCN reaction causing severe rash involving mucus membranes or skin necrosis: No Has patient had a PCN reaction that required hospitalization unknown Has patient had a PCN reaction occurring within the last 10 years: No If all of the above answers are  "NO", then may proceed with Cephalosporin use.    Sulfa Antibiotics Swelling and Rash   Sulfasalazine Rash and Swelling   Tramadol Other (See Comments)    Rapid heart rate   Ciprofloxacin     Muscular pain   Trimethoprim     rash   Isovue [Iopamidol] Hives    Pt had sneezing, itchy throat, a couple of hives and swollen, itchy left eye. Pt was given 50 mg po benadryl, and water.  Dr Chestine Spore checked pt.  We observed pt for 30 mins w/ 5 minute BP checks.  Pt left w/o complication.  Pt will need full premeds in the future.  Gildardo Griffes, RTRCT     Outpatient Medications Prior to Visit  Medication Sig Dispense Refill   acetaminophen (TYLENOL) 650 MG CR tablet Take 650 mg by mouth every 8 (eight) hours as needed for pain.     albuterol (VENTOLIN HFA) 108 (90 Base) MCG/ACT inhaler Inhale 2 puffs into the lungs every 4 (four) hours as needed for wheezing or shortness of breath. 1 each 2   ALPRAZolam (XANAX) 0.5 MG tablet Take 1 tablet (0.5 mg total) by mouth at bedtime. 90 tablet 1   atorvastatin (LIPITOR) 20 MG tablet TAKE 1 TABLET AT BEDTIME 90 tablet 3   carvedilol (COREG) 25 MG tablet TAKE 1 TABLET TWICE A DAY 180 tablet 3   cetirizine (ZYRTEC) 10 MG tablet TAKE 1 TABLET DAILY 90 tablet 3   Cholecalciferol (VITAMIN D) 2000 UNITS CAPS Take 2,000 Units by mouth daily.      diclofenac Sodium (VOLTAREN) 1 % GEL Apply 2 g topically 4 (four) times daily as needed (arthritis pain, hands). 300 g 3   famotidine (PEPCID) 40 MG tablet Take 40 mg by mouth 2 (two) times daily.     fluticasone (FLONASE) 50 MCG/ACT nasal spray Place 2 sprays into both nostrils daily. 16 g 2   hydrochlorothiazide (MICROZIDE) 12.5 MG capsule Take 1 capsule (12.5 mg total) by mouth daily. 90 capsule 3   ipratropium (ATROVENT) 0.03 % nasal spray Place 2 sprays into  both nostrils 2 (two) times daily as needed for rhinitis. 30 mL 12   LINZESS 290 MCG CAPS capsule Take 290 mcg by mouth daily.     montelukast (SINGULAIR) 10 MG tablet TAKE  1 TABLET AT BEDTIME 90 tablet 3   Multiple Vitamin (MULTIVITAMIN) tablet Take 1 tablet by mouth daily.     pantoprazole (PROTONIX) 40 MG tablet Take 40 mg by mouth daily.     polyethylene glycol powder (GLYCOLAX/MIRALAX) powder Take 17 g by mouth daily as needed. 3350 g 1   Probiotic Product (PROBIOTIC PO) Take 1 capsule by mouth daily.      solifenacin (VESICARE) 5 MG tablet Take 5 mg by mouth daily.     No facility-administered medications prior to visit.   Review of Systems  Constitutional:  Negative for chills, fever, malaise/fatigue and weight loss.  HENT:  Negative for congestion, sinus pain and sore throat.   Eyes: Negative.   Respiratory:  Positive for cough and sputum production. Negative for hemoptysis, shortness of breath and wheezing.   Cardiovascular:  Negative for chest pain, palpitations, orthopnea, claudication and leg swelling.  Gastrointestinal:  Positive for heartburn. Negative for abdominal pain, nausea and vomiting.  Genitourinary: Negative.   Musculoskeletal:  Negative for joint pain and myalgias.  Skin:  Negative for rash.  Neurological:  Negative for weakness.  Endo/Heme/Allergies: Negative.   Psychiatric/Behavioral: Negative.      Objective:   Vitals:   02/14/23 1330  BP: 112/68  Pulse: 70  Temp: 98.2 F (36.8 C)  TempSrc: Oral  SpO2: 95%  Weight: 196 lb 6.4 oz (89.1 kg)  Height: 5\' 3"  (1.6 m)    Physical Exam Constitutional:      General: She is not in acute distress.    Appearance: She is obese. She is not ill-appearing.  HENT:     Head: Normocephalic and atraumatic.     Nose: Nose normal.     Mouth/Throat:     Mouth: Mucous membranes are moist.     Pharynx: Oropharynx is clear.  Eyes:     General: No scleral icterus.    Conjunctiva/sclera: Conjunctivae normal.  Cardiovascular:     Rate and Rhythm: Normal rate and regular rhythm.     Pulses: Normal pulses.     Heart sounds: Normal heart sounds. No murmur heard. Pulmonary:     Effort:  Pulmonary effort is normal.     Breath sounds: Normal breath sounds. No wheezing, rhonchi or rales.  Musculoskeletal:     Right lower leg: No edema.     Left lower leg: No edema.  Skin:    General: Skin is warm and dry.  Neurological:     Mental Status: She is alert.    CBC    Component Value Date/Time   WBC 4.1 01/29/2023 1018   RBC 4.39 01/29/2023 1018   HGB 14.0 01/29/2023 1018   HGB 13.8 11/21/2021 1301   HCT 40.8 01/29/2023 1018   HCT 42.1 11/21/2021 1301   PLT 233.0 01/29/2023 1018   PLT 197 11/21/2021 1301   MCV 92.9 01/29/2023 1018   MCV 93 11/21/2021 1301   MCH 30.5 11/21/2021 1301   MCH 31.3 04/03/2021 1316   MCHC 34.2 01/29/2023 1018   RDW 13.4 01/29/2023 1018   RDW 14.1 11/21/2021 1301   LYMPHSABS 1.3 01/29/2023 1018   LYMPHSABS 1.3 11/21/2021 1301   MONOABS 0.4 01/29/2023 1018   EOSABS 0.1 01/29/2023 1018   EOSABS 0.2 11/21/2021 1301   BASOSABS 0.1  01/29/2023 1018   BASOSABS 0.1 11/21/2021 1301      Latest Ref Rng & Units 01/29/2023   10:18 AM 12/24/2022    9:04 AM 01/18/2022    3:44 PM  BMP  Glucose 70 - 99 mg/dL 98  96  91   BUN 6 - 23 mg/dL 17  19  20    Creatinine 0.40 - 1.20 mg/dL 1.61  0.96  0.45   Sodium 135 - 145 mEq/L 138  138  140   Potassium 3.5 - 5.1 mEq/L 4.5  4.4  4.2   Chloride 96 - 112 mEq/L 103  106  107   CO2 19 - 32 mEq/L 28  28  29    Calcium 8.4 - 10.5 mg/dL 9.5  9.5  9.5    Chest imaging: CT Chest 10/05/21 1. The previous ground-glass nodules in the lingula have resolved in the interim, consistent with benign process. 2. The 4 mm right lower lobe nodule is unchanged from 2020 exam and is definitively benign. A 3 mm left lower lobe nodule is unchanged from most recent prior, previously obscured by motion. In a low risk patient, a nodule of this size needs no further follow-up. 3. Aortic atherosclerosis.  Coronary artery calcifications.  CT Chest 2020 Mediastinum/Nodes: There is no hilar or mediastinal adenopathy. The esophagus  is grossly unremarkable. Subcentimeter right thyroid hypodense nodule. No mediastinal fluid collection.   Lungs/Pleura: There is no focal consolidation, pleural effusion, or pneumothorax. Faint scattered hazy densities, likely atelectatic changes. Atypical infiltrate is less likely. There is a 3 mm right lower lobe subpleural nodule (series 8, image 73). The central airways are patent.  PFT:    Latest Ref Rng & Units 05/17/2021    1:51 PM  PFT Results  FVC-Pre L 3.15   FVC-Predicted Pre % 114   FVC-Post L 3.30   FVC-Predicted Post % 119   Pre FEV1/FVC % % 88   Post FEV1/FCV % % 58   FEV1-Pre L 2.76   FEV1-Predicted Pre % 132   FEV1-Post L 1.93   DLCO uncorrected ml/min/mmHg 20.02   DLCO UNC% % 110   DLCO corrected ml/min/mmHg 19.67   DLCO COR %Predicted % 108   DLVA Predicted % 105   TLC L 6.72   TLC % Predicted % 141   RV % Predicted % 80    Echo 06/11/2018: LV EF 60-65%. Grade 1 diastolic dysfunction. RV cavity is normal and normal systolic function.    Assessment & Plan:   Chronic cough  Gastroesophageal reflux disease without esophagitis  Post-nasal drainage  Discussion: Erin West is a 72 year old woman, never smoker with history of GERD, hypertension and DVT who returns to pulmonary clinic for cough.   She is to continue ipratropium and fluticasone nasal sprays. She can stop montelukast and monitor for changes in her cough. After stopping montelukast for 2-4 weeks, she can consider backing off ipratropium nasal spray incase this is leading to dry throat.   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 Recommend she follow up with her GI doctor regarding ongoing GERD therapies. She is to continue 40 mg daily of pantoprazole for GERD along with famotidine. She is to continue to sleep with the head of the bed elevated to  reduce any nocturnal reflux symptoms.  Follow up as needed.  Melody Comas, MD [place]  [facility] Office: [phone]   Current Outpatient Medications:    acetaminophen (TYLENOL) 650 MG CR tablet, Take 650 mg by mouth every 8 (eight) hours as needed for pain., Disp: , Rfl:    albuterol (VENTOLIN HFA) 108 (90 Base) MCG/ACT inhaler, Inhale 2 puffs into the lungs every 4 (four) hours as needed for wheezing or shortness of breath., Disp: 1 each, Rfl: 2   ALPRAZolam (XANAX) 0.5 MG tablet, Take 1 tablet (0.5 mg total) by mouth at bedtime., Disp: 90 tablet, Rfl: 1   atorvastatin (LIPITOR) 20 MG tablet, TAKE 1 TABLET AT BEDTIME, Disp: 90 tablet, Rfl: 3   carvedilol (COREG) 25 MG tablet, TAKE 1 TABLET TWICE A DAY, Disp: 180 tablet, Rfl: 3   cetirizine (ZYRTEC) 10 MG tablet, TAKE 1 TABLET DAILY, Disp: 90 tablet, Rfl: 3   Cholecalciferol (VITAMIN D) 2000 UNITS CAPS, Take 2,000 Units by mouth daily. , Disp: , Rfl:    diclofenac Sodium (VOLTAREN) 1 % GEL, Apply 2 g topically 4 (four) times daily as needed (arthritis pain, hands)., Disp: 300 g, Rfl: 3   famotidine (PEPCID) 40 MG tablet, Take 40 mg by mouth 2 (two) times daily., Disp: , Rfl:    fluticasone (FLONASE) 50 MCG/ACT nasal spray, Place 2 sprays into both nostrils daily., Disp: 16 g, Rfl: 2   hydrochlorothiazide (MICROZIDE) 12.5 MG capsule, Take 1 capsule (12.5 mg total) by mouth daily., Disp: 90 capsule, Rfl: 3   ipratropium (ATROVENT) 0.03 % nasal spray, Place 2 sprays into both nostrils 2 (two) times daily as needed for rhinitis., Disp: 30 mL, Rfl: 12   LINZESS 290 MCG CAPS capsule, Take 290 mcg by mouth daily., Disp: , Rfl:    montelukast (SINGULAIR) 10 MG tablet, TAKE 1 TABLET AT BEDTIME, Disp: 90 tablet, Rfl: 3   Multiple Vitamin (MULTIVITAMIN) tablet, Take 1 tablet by mouth daily., Disp: , Rfl:    pantoprazole (PROTONIX) 40 MG tablet, Take 40 mg by mouth daily., Disp: , Rfl:    polyethylene glycol powder  (GLYCOLAX/MIRALAX) powder, Take 17 g by mouth daily as needed., Disp: 3350 g, Rfl: 1   Probiotic Product (PROBIOTIC PO), Take 1 capsule by mouth daily. , Disp: , Rfl:    solifenacin (VESICARE) 5 MG tablet, Take 5 mg by mouth daily., Disp: , Rfl:

## 2023-02-14 NOTE — Patient Instructions (Addendum)
Recommend stopping montelukast 10mg  at bedtime and monitoring your cough symptoms over the next 2-4 weeks  Can consider backing off ipratropium nasal spray in the future incase this is leading to your dry throat  I would discuss further with Dr. Loreta Ave regarding further GERD workup and treatment  Follow up as needed.

## 2023-02-21 ENCOUNTER — Ambulatory Visit: Payer: Medicare Other | Admitting: Pulmonary Disease

## 2023-02-24 ENCOUNTER — Other Ambulatory Visit: Payer: Self-pay | Admitting: Family Medicine

## 2023-03-01 ENCOUNTER — Encounter: Payer: Self-pay | Admitting: Nurse Practitioner

## 2023-03-01 ENCOUNTER — Ambulatory Visit: Payer: Medicare Other | Attending: Nurse Practitioner | Admitting: Nurse Practitioner

## 2023-03-01 VITALS — BP 128/86 | HR 62 | Ht 63.0 in | Wt 192.6 lb

## 2023-03-01 DIAGNOSIS — Z86718 Personal history of other venous thrombosis and embolism: Secondary | ICD-10-CM | POA: Diagnosis not present

## 2023-03-01 DIAGNOSIS — E785 Hyperlipidemia, unspecified: Secondary | ICD-10-CM | POA: Insufficient documentation

## 2023-03-01 DIAGNOSIS — R002 Palpitations: Secondary | ICD-10-CM | POA: Diagnosis not present

## 2023-03-01 DIAGNOSIS — I10 Essential (primary) hypertension: Secondary | ICD-10-CM | POA: Insufficient documentation

## 2023-03-01 DIAGNOSIS — I2510 Atherosclerotic heart disease of native coronary artery without angina pectoris: Secondary | ICD-10-CM | POA: Diagnosis not present

## 2023-03-01 DIAGNOSIS — I6523 Occlusion and stenosis of bilateral carotid arteries: Secondary | ICD-10-CM | POA: Diagnosis not present

## 2023-03-01 NOTE — Progress Notes (Signed)
Office Visit    Patient Name: Erin West Date of Encounter: 03/01/2023  Primary Care Provider:  Willow Ora, MD Primary Cardiologist:  Olga Millers, MD  Chief Complaint    72 year old female with a history of palpitations, atypical chest pain, carotid artery stenosis, hypertension, hyperlipidemia, DVT, OA, asthma, and GERD who presents for follow-up related to palpitations.  Past Medical History    Past Medical History:  Diagnosis Date   Anxiety    Arthritis    Aspiration pneumonia (HCC) 2013   Atypical chest pain    Stress test 04/21/10 - post-stress EF=89%. Normal scan.   Diarrhea    Diastolic dysfunction    But normal LV function on ECHO 2011 and low risk Myoview in 2011   DVT (deep venous thrombosis) (HCC)    Dyslipidemia    GERD (gastroesophageal reflux disease)    Hypertension    Osteoarthritis    Osteopenia    Palpitations    [employer] MCT Monitor 04/16/10-04/22/10    Syncope    Pain-mediated syncope   Past Surgical History:  Procedure Laterality Date   CHOLECYSTECTOMY  2006   KNEE ARTHROSCOPY Right    REPLACEMENT TOTAL KNEE Left    left   REPLACEMENT TOTAL KNEE Right    TUBAL LIGATION  06/1978    Allergies  Allergies  Allergen Reactions   Penicillins Swelling and Rash    Has patient had a PCN reaction causing immediate rash, facial/tongue/throat swelling, SOB or lightheadedness with hypotension: No Has patient had a PCN reaction causing severe rash involving mucus membranes or skin necrosis: No Has patient had a PCN reaction that required hospitalization unknown Has patient had a PCN reaction occurring within the last 10 years: No If all of the above answers are "NO", then may proceed with Cephalosporin use.    Sulfa Antibiotics Swelling and Rash   Sulfasalazine Rash and Swelling   Tramadol Other (See Comments)    Rapid heart rate   Ciprofloxacin     Muscular pain   Trimethoprim     rash   Isovue [Iopamidol] Hives    Pt had sneezing,  itchy throat, a couple of hives and swollen, itchy left eye. Pt was given 50 mg po benadryl, and water.  Dr Chestine Spore checked pt.  We observed pt for 30 mins w/ 5 minute BP checks.  Pt left w/o complication.  Pt will need full premeds in the future.  Gildardo Griffes, RTRCT     Labs/Other Studies Reviewed    The following studies were reviewed today:  Cardiac Studies & Procedures     STRESS TESTS  MYOCARDIAL PERFUSION IMAGING 04/29/2015   ECHOCARDIOGRAM  ECHOCARDIOGRAM COMPLETE 06/16/2021  Narrative ECHOCARDIOGRAM REPORT    Patient Name:   Erin West Date of Exam: 06/16/2021 Medical Rec #:  [id number]      Height:       62.0 in Accession #:    [id number]     Weight:       200.0 lb Date of Birth:  1951-03-09       BSA:          1.912 m Patient Age:    70 years       BP:           143/96 mmHg Patient Gender: F              HR:           74 bpm. Exam Location:  Outpatient  Procedure: 2D  Echo, Color Doppler and Cardiac Doppler  Indications:    R00.2 Palpitations  History:        Patient has prior history of Echocardiogram examinations, most recent 06/11/2018. Risk Factors:Hypertension, Dyslipidemia and Non-Smoker.  Sonographer:    Jeryl Columbia RDCS Referring Phys: [id number] Erin West  IMPRESSIONS   1. Left ventricular ejection fraction, by estimation, is 70 to 75%. The left ventricle has hyperdynamic function. The left ventricle has no regional wall motion abnormalities. Left ventricular diastolic parameters were normal. 2. Right ventricular systolic function is normal. The right ventricular size is normal. 3. The mitral valve is normal in structure. Trivial mitral valve regurgitation. 4. The aortic valve is normal in structure. Aortic valve regurgitation is trivial. 5. The inferior vena cava is normal in size with greater than 50% respiratory variability, suggesting right atrial pressure of 3 mmHg.  Comparison(s): The left ventricular function is unchanged.  FINDINGS Left  Ventricle: Left ventricular ejection fraction, by estimation, is 70 to 75%. The left ventricle has hyperdynamic function. The left ventricle has no regional wall motion abnormalities. The left ventricular internal cavity size was normal in size. There is no left ventricular hypertrophy. Left ventricular diastolic parameters were normal.  Right Ventricle: The right ventricular size is normal. Right vetricular wall thickness was not assessed. Right ventricular systolic function is normal.  Left Atrium: Left atrial size was normal in size.  Right Atrium: Right atrial size was normal in size.  Pericardium: There is no evidence of pericardial effusion. Presence of pericardial fat pad.  Mitral Valve: The mitral valve is normal in structure. Trivial mitral valve regurgitation.  Tricuspid Valve: The tricuspid valve is normal in structure. Tricuspid valve regurgitation is trivial.  Aortic Valve: The aortic valve is normal in structure. Aortic valve regurgitation is trivial. Aortic regurgitation PHT measures 521 msec.  Pulmonic Valve: The pulmonic valve was normal in structure. Pulmonic valve regurgitation is not visualized.  Aorta: The aortic root and ascending aorta are structurally normal, with no evidence of dilitation.  Venous: The inferior vena cava is normal in size with greater than 50% respiratory variability, suggesting right atrial pressure of 3 mmHg.  IAS/Shunts: No atrial level shunt detected by color flow Doppler.   LEFT VENTRICLE PLAX 2D LVIDd:         4.15 cm  Diastology LVIDs:         2.22 cm  LV e' medial:    5.66 cm/s LV PW:         1.05 cm  LV E/e' medial:  12.9 LV IVS:        0.73 cm  LV e' lateral:   6.64 cm/s LVOT diam:     2.10 cm  LV E/e' lateral: 11.0 LVOT Area:     3.46 cm   RIGHT VENTRICLE RV Basal diam:  2.91 cm RV Mid diam:    2.26 cm RV S prime:     14.70 cm/s TAPSE (M-mode): 2.4 cm  LEFT ATRIUM             Index       RIGHT ATRIUM          Index LA  diam:        3.20 cm 1.67 cm/m  RA Area:     8.07 cm LA Vol (A2C):   36.2 ml 18.94 ml/m RA Volume:   14.00 ml 7.32 ml/m LA Vol (A4C):   28.0 ml 14.65 ml/m LA Biplane Vol: 33.7 ml 17.63 ml/m AORTIC VALVE AI  PHT:      521 msec  AORTA Ao Root diam: 2.80 cm  MITRAL VALVE               TRICUSPID VALVE MV Area (PHT): 3.77 cm    TR Peak grad:   18.0 mmHg MV Decel Time: 201 msec    TR Vmax:        212.00 cm/s MV E velocity: 72.80 cm/s MV A velocity: 99.60 cm/s  SHUNTS MV E/A ratio:  0.73        Systemic Diam: 2.10 cm  Dietrich Pates MD Electronically signed by Dietrich Pates MD Signature Date/Time: 06/16/2021/9:43:06 PM    Final    MONITORS  LONG TERM MONITOR (3-14 DAYS) 12/05/2021  Narrative Patch Wear Time:  6 days and 3 hours (2023-02-28T11:47:49-0500 to 2023-03-06T15:29:07-499)  Patient had a min HR of 66 bpm, max HR of 136 bpm, and avg HR of 88 bpm. Predominant underlying rhythm was Sinus Rhythm. 1 run of Supraventricular Tachycardia occurred lasting 4 beats with a max rate of 132 bpm (avg 129 bpm). Isolated SVEs were rare (<1.0%), SVE Triplets were rare (<1.0%), and no SVE Couplets were present. Isolated VEs were rare (<1.0%), and no VE Couplets or VE Triplets were present.  Normal sinus rhythm, sinus tachycardia, occasional PAC, 4 beat PAT, occasional PVC.  Symptoms associated with normal sinus rhythm and normal sinus rhythm with PVC. Olga Millers          Recent Labs: 12/24/2022: ALT 16; TSH 2.75 01/29/2023: BUN 17; Creatinine, Ser 0.94; Hemoglobin 14.0; Platelets 233.0; Potassium 4.5; Sodium 138  Recent Lipid Panel    Component Value Date/Time   CHOL 151 12/24/2022 0904   CHOL 151 11/21/2021 1301   TRIG 78.0 12/24/2022 0904   HDL 55.00 12/24/2022 0904   HDL 71 11/21/2021 1301   CHOLHDL 3 12/24/2022 0904   VLDL 15.6 12/24/2022 0904   LDLCALC 81 12/24/2022 0904   LDLCALC 69 11/21/2021 1301    History of Present Illness    72 year old female with the above past  medical history including palpitations, coronary calcification noted on noncardiac CT, atypical chest pain, carotid artery stenosis, hypertension, hyperlipidemia, DVT, OA, asthma, and GERD.  Stress test in 2011 was nonischemic. She had a DVT in 2017 following right TKA.  Echocardiogram in 2019 showed normal EF, G1 DD, no significant valvular disease.  She has had 3 prior cardiac monitors in 2018, 2022, and 2023, all showing sinus rhythm with PVCs.  CT of the chest in 2022 showed coronary artery calcification.  Repeat ZIO in 10/2021 in the setting of persistent palpitations following COVID-19 infection revealed sinus rhythm with PVCs.  Her palpitations were felt to be related to recent viral illness and anxiety.  She was continued on carvedilol (previously intolerant to diltiazem).  She was last seen in the office on 04/02/2022 and was stable from a cardiac standpoint.  She denied significant palpitations or symptoms concerning for angina.  She was apparently told by her dentist to see cardiology due to concern for carotid artery stenosis noted on plain films at dental office.  Carotid ultrasound in 03/2022 revealed 1 to 39% B ICA stenosis.  She presents today for follow-up.  Since her last visit she  has been stable overall from a cardiac standpoint.  She denies any significant palpitations, dizziness, presyncope, syncope.  Her BP has been well-controlled.  She did have an episode that occurred 2 days ago after eating cabbage for dinner she had discomfort in her chest  that lasted all night.  She thinks this was related to indigestion.  She denies any other symptoms concerning for angina.  Overall, she reports feeling well.    Home Medications    Current Outpatient Medications  Medication Sig Dispense Refill   acetaminophen (TYLENOL) 650 MG CR tablet Take 650 mg by mouth every 8 (eight) hours as needed for pain.     albuterol (VENTOLIN HFA) 108 (90 Base) MCG/ACT inhaler Inhale 2 puffs into the lungs every 4  (four) hours as needed for wheezing or shortness of breath. 1 each 2   atorvastatin (LIPITOR) 20 MG tablet TAKE 1 TABLET AT BEDTIME 90 tablet 3   carvedilol (COREG) 25 MG tablet TAKE 1 TABLET TWICE A DAY 180 tablet 3   cetirizine (ZYRTEC) 10 MG tablet TAKE 1 TABLET DAILY 90 tablet 3   Cholecalciferol (VITAMIN D) 2000 UNITS CAPS Take 2,000 Units by mouth daily.      diclofenac Sodium (VOLTAREN) 1 % GEL Apply 2 g topically 4 (four) times daily as needed (arthritis pain, hands). 300 g 3   famotidine (PEPCID) 40 MG tablet Take 40 mg by mouth 2 (two) times daily.     fluticasone (FLONASE) 50 MCG/ACT nasal spray Place 2 sprays into both nostrils daily. 16 g 2   ipratropium (ATROVENT) 0.03 % nasal spray Place 2 sprays into both nostrils 2 (two) times daily as needed for rhinitis. 30 mL 12   LINZESS 290 MCG CAPS capsule Take 290 mcg by mouth daily.     montelukast (SINGULAIR) 10 MG tablet TAKE 1 TABLET AT BEDTIME 90 tablet 3   Multiple Vitamin (MULTIVITAMIN) tablet Take 1 tablet by mouth daily.     pantoprazole (PROTONIX) 40 MG tablet Take 40 mg by mouth daily.     polyethylene glycol powder (GLYCOLAX/MIRALAX) powder Take 17 g by mouth daily as needed. 3350 g 1   Probiotic Product (PROBIOTIC PO) Take 1 capsule by mouth daily.      solifenacin (VESICARE) 5 MG tablet Take 5 mg by mouth daily.     ALPRAZolam (XANAX) 0.5 MG tablet Take 1 tablet (0.5 mg total) by mouth at bedtime. (Patient not taking: Reported on 03/01/2023) 90 tablet 1   hydrochlorothiazide (MICROZIDE) 12.5 MG capsule Take 1 capsule (12.5 mg total) by mouth daily. (Patient not taking: Reported on 03/01/2023) 90 capsule 3   No current facility-administered medications for this visit.     Review of Systems    He denies chest pain, palpitations, dyspnea, pnd, orthopnea, n, v, dizziness, syncope, edema, weight gain, or early satiety. All other systems reviewed and are otherwise negative except as noted above.   Physical Exam    VS:  BP  128/86 (BP Location: Left Arm, Patient Position: Sitting, Cuff Size: Normal)   Pulse 62   Ht 5\' 3"  (1.6 m)   Wt 192 lb 9.6 oz (87.4 kg)   LMP 09/24/2012 (Approximate)   SpO2 98%   BMI 34.12 kg/m  GEN: Well nourished, well developed, in no acute distress. HEENT: normal. Neck: Supple, no JVD, carotid bruits, or masses. Cardiac: RRR, no murmurs, rubs, or gallops. No clubbing, cyanosis, edema.  Radials/DP/PT 2+ and equal bilaterally.  Respiratory:  Respirations regular and unlabored, clear to auscultation bilaterally. GI: Soft, nontender, nondistended, BS + x 4. MS: no deformity or atrophy. Skin: warm and dry, no rash. Neuro:  Strength and sensation are intact. Psych: Normal affect.  Accessory Clinical Findings    ECG personally reviewed by me today -NSR, 62  bpm- no acute changes.   Lab Results  Component Value Date   WBC 4.1 01/29/2023   HGB 14.0 01/29/2023   HCT 40.8 01/29/2023   MCV 92.9 01/29/2023   PLT 233.0 01/29/2023   Lab Results  Component Value Date   CREATININE 0.94 01/29/2023   BUN 17 01/29/2023   NA 138 01/29/2023   K 4.5 01/29/2023   CL 103 01/29/2023   CO2 28 01/29/2023   Lab Results  Component Value Date   ALT 16 12/24/2022   AST 15 12/24/2022   ALKPHOS 86 12/24/2022   BILITOT 0.9 12/24/2022   Lab Results  Component Value Date   CHOL 151 12/24/2022   HDL 55.00 12/24/2022   LDLCALC 81 12/24/2022   TRIG 78.0 12/24/2022   CHOLHDL 3 12/24/2022    Lab Results  Component Value Date   HGBA1C 5.0 12/28/2015    Assessment & Plan    1. Atypical chest pain/coronary artery calcification noted on CT: Recent episode of chest discomfort that lasted all night long after eating cabbage for dinner.  She is fairly certain this was related to indigestion.  Generally stable with no anginal symptoms. No indication for ischemic evaluation. Given recent chest discomfort, the likely GI in nature, I advised her to continue to monitor symptoms.  If she has more frequent  or persistent symptoms she should notify us.  Could consider coronary CT angiogram.  Discussed ED precautions.  Continue carvedilol, Lipitor.  2. Palpitations: Cardiac monitor in 2018, 2022, and most recently in 2023 revealed sinus rhythm with PVCs.  Recent palpitations.  Stable on carvedilol.  3. Carotid artery stenosis: Carotid ultrasound in 03/2022 revealed 1 to 39% B ICA stenosis.  Asymptomatic.  No indication for repeat study at this time.  Continue Lipitor.   4. Asymptomatic. No indication for repeat study at this time.  Continue Lipitor.  5. Hypertension: BP well controlled. Continue current antihypertensive regimen.   6. Hyperlipidemia: LDL was 81 in 12/2022.  Goal less than 70.  We discussed possibly increasing her Lipitor, however, patient declines at this time.  Continue Lipitor.  7. History of DVT: No recurrence.  No longer on anticoagulation.  8. Disposition: Follow-up in 1 year.      Joylene Grapes, NP 03/01/2023, 2:46 PM

## 2023-03-01 NOTE — Patient Instructions (Addendum)
Medication Instructions:  Your physician recommends that you continue on your current medications as directed. Please refer to the Current Medication list given to you today.  *If you need a refill on your cardiac medications before your next appointment, please call your pharmacy*   Lab Work: NONE ordered at this time of appointment   Testing/Procedures: NONE ordered at this time of appointment     Follow-Up: At [facility], you and your health needs are our priority.  As part of our continuing mission to provide you with exceptional heart care, we have created designated Provider Care Teams.  These Care Teams include your primary Cardiologist (physician) and Advanced Practice Providers (APPs -  Physician Assistants and Nurse Practitioners) who all work together to provide you with the care you need, when you need it.  We recommend signing up for the patient portal called "MyChart".  Sign up information is provided on this After Visit Summary.  MyChart is used to connect with patients for Virtual Visits (Telemedicine).  Patients are able to view lab/test results, encounter notes, upcoming appointments, etc.  Non-urgent messages can be sent to your provider as well.   To learn more about what you can do with MyChart, go to [email or web address].    Your next appointment:   1 year(s)  Provider:   Olga Millers, MD     Other Instructions

## 2023-03-19 ENCOUNTER — Ambulatory Visit (INDEPENDENT_AMBULATORY_CARE_PROVIDER_SITE_OTHER): Payer: Medicare Other

## 2023-03-19 VITALS — Wt 192.0 lb

## 2023-03-19 DIAGNOSIS — Z0000 Encounter for general adult medical examination without abnormal findings: Secondary | ICD-10-CM

## 2023-03-19 NOTE — Progress Notes (Signed)
Subjective:   Erin West is a 72 y.o. female who presents for Medicare Annual (Subsequent) preventive examination.  Visit Complete: Virtual  I connected with  Erin West on 03/19/23 by a audio enabled telemedicine application and verified that I am speaking with the correct person using two identifiers.  Patient Location: Home  Provider Location: Office/Clinic  I discussed the limitations of evaluation and management by telemedicine. The patient expressed understanding and agreed to proceed.  Review of Systems     Cardiac Risk Factors include: advanced age (>48men, >83 women);hypertension;obesity (BMI >30kg/m2)     Objective:    Today's Vitals   03/19/23 1443  Weight: 192 lb (87.1 kg)   Body mass index is 34.01 kg/m.     03/19/2023    2:48 PM 04/03/2021    1:12 PM 10/29/2019    3:07 PM 08/23/2019    8:25 PM 10/22/2018    8:35 AM 02/01/2016   10:26 AM 02/22/2012    8:26 PM  Advanced Directives  Does Patient Have a Medical Advance Directive? Yes Yes Yes Yes Yes Yes Patient has advance directive, copy not in chart  Type of Advance Directive Healthcare Power of [employer];Living will Healthcare Power of Attorney Living will;Healthcare Power of [employer] Power of [place];Living will Healthcare Power of [place];Living will Living will Healthcare Power of [place];Living will  Does patient want to make changes to medical advance directive?  No - Patient declined No - Patient declined      Copy of Healthcare Power of Attorney in Chart? No - copy requested  No - copy requested No - copy requested No - copy requested  Copy requested from family  Would patient like information on creating a medical advance directive?    No - Patient declined       Current Medications (verified) Outpatient Encounter Medications as of 03/19/2023  Medication Sig   acetaminophen (TYLENOL) 650 MG CR tablet Take 650 mg by mouth every 8 (eight) hours as needed for pain.   albuterol  (VENTOLIN HFA) 108 (90 Base) MCG/ACT inhaler Inhale 2 puffs into the lungs every 4 (four) hours as needed for wheezing or shortness of breath.   atorvastatin (LIPITOR) 20 MG tablet TAKE 1 TABLET AT BEDTIME   carvedilol (COREG) 25 MG tablet TAKE 1 TABLET TWICE A DAY   cetirizine (ZYRTEC) 10 MG tablet TAKE 1 TABLET DAILY   Cholecalciferol (VITAMIN D) 2000 UNITS CAPS Take 2,000 Units by mouth daily.    diclofenac Sodium (VOLTAREN) 1 % GEL Apply 2 g topically 4 (four) times daily as needed (arthritis pain, hands).   famotidine (PEPCID) 40 MG tablet Take 40 mg by mouth 2 (two) times daily.   fluticasone (FLONASE) 50 MCG/ACT nasal spray Place 2 sprays into both nostrils daily.   ipratropium (ATROVENT) 0.03 % nasal spray Place 2 sprays into both nostrils 2 (two) times daily as needed for rhinitis.   LINZESS 290 MCG CAPS capsule Take 290 mcg by mouth daily.   montelukast (SINGULAIR) 10 MG tablet TAKE 1 TABLET AT BEDTIME   Multiple Vitamin (MULTIVITAMIN) tablet Take 1 tablet by mouth daily.   pantoprazole (PROTONIX) 40 MG tablet Take 40 mg by mouth daily.   polyethylene glycol powder (GLYCOLAX/MIRALAX) powder Take 17 g by mouth daily as needed.   Probiotic Product (PROBIOTIC PO) Take 1 capsule by mouth daily.    solifenacin (VESICARE) 5 MG tablet Take 5 mg by mouth daily.   [DISCONTINUED] ALPRAZolam (XANAX) 0.5 MG tablet Take 1  tablet (0.5 mg total) by mouth at bedtime. (Patient not taking: Reported on 03/01/2023)   [DISCONTINUED] hydrochlorothiazide (MICROZIDE) 12.5 MG capsule Take 1 capsule (12.5 mg total) by mouth daily. (Patient not taking: Reported on 03/01/2023)   No facility-administered encounter medications on file as of 03/19/2023.    Allergies (verified) Penicillins, Sulfa antibiotics, Sulfasalazine, Tramadol, Ciprofloxacin, Trimethoprim, and Isovue [iopamidol]   History: Past Medical History:  Diagnosis Date   Anxiety    Arthritis    Aspiration pneumonia (HCC) 2013   Atypical chest pain     Stress test 04/21/10 - post-stress EF=89%. Normal scan.   Diarrhea    Diastolic dysfunction    But normal LV function on ECHO 2011 and low risk Myoview in 2011   DVT (deep venous thrombosis) (HCC)    Dyslipidemia    GERD (gastroesophageal reflux disease)    Hypertension    Osteoarthritis    Osteopenia    Palpitations    [employer] MCT Monitor 04/16/10-04/22/10    Syncope    Pain-mediated syncope   Past Surgical History:  Procedure Laterality Date   CHOLECYSTECTOMY  2006   KNEE ARTHROSCOPY Right    REPLACEMENT TOTAL KNEE Left    left   REPLACEMENT TOTAL KNEE Right    TUBAL LIGATION  06/1978   Family History  Problem Relation Age of Onset   Heart disease Mother    Other Mother        leg amputation   Arthritis Mother    Osteoporosis Mother    Mental illness Father    Sudden death Father    Arthritis Father    Thyroid disease Father    Stroke Paternal Grandfather    Social History   Socioeconomic History   Marital status: Widowed    Spouse name: Not on file   Number of children: 3   Years of education: Not on file   Highest education level: Not on file  Occupational History   Not on file  Tobacco Use   Smoking status: Never   Smokeless tobacco: Never  Vaping Use   Vaping Use: Never used  Substance and Sexual Activity   Alcohol use: Yes    Alcohol/week: 0.0 standard drinks of alcohol    Comment: 2-3/wine per month   Drug use: No   Sexual activity: Not Currently    Comment: 1st intercourse 72 yo-Fewer than 5 partners  Other Topics Concern   Not on file  Social History Narrative   Not on file   Social Determinants of Health   Financial Resource Strain: Low Risk  (03/19/2023)   Overall Financial Resource Strain (CARDIA)    Difficulty of Paying Living Expenses: Not hard at all  Food Insecurity: No Food Insecurity (03/19/2023)   Hunger Vital Sign    Worried About Running Out of Food in the Last Year: Never true    Ran Out of Food in the Last Year: Never true   Transportation Needs: No Transportation Needs (03/19/2023)   PRAPARE - Administrator, Civil Service (Medical): No    Lack of Transportation (Non-Medical): No  Physical Activity: Sufficiently Active (03/19/2023)   Exercise Vital Sign    Days of Exercise per Week: 4 days    Minutes of Exercise per Session: 60 min  Stress: No Stress Concern Present (03/19/2023)   [employer] of Occupational Health - Occupational Stress Questionnaire    Feeling of Stress : Not at all  Social Connections: Moderately Isolated (03/19/2023)   Social Connection and  Isolation Panel [NHANES]    Frequency of Communication with Friends and Family: Three times a week    Frequency of Social Gatherings with Friends and Family: Three times a week    Attends Religious Services: 1 to 4 times per year    Active Member of Clubs or Organizations: No    Attends Banker Meetings: Never    Marital Status: Widowed    Tobacco Counseling Counseling given: Not Answered   Clinical Intake:  Pre-visit preparation completed: Yes  Pain : No/denies pain     BMI - recorded: 34.01 Nutritional Status: BMI > 30  Obese Diabetes: No  How often do you need to have someone help you when you read instructions, pamphlets, or other written materials from your doctor or pharmacy?: 1 - Never  Interpreter Needed?: No  Information entered by :: Lanier Ensign, LPN   Activities of Daily Living    03/19/2023    2:48 PM  In your present state of health, do you have any difficulty performing the following activities:  Hearing? 0  Vision? 0  Difficulty concentrating or making decisions? 0  Walking or climbing stairs? 0  Dressing or bathing? 0  Doing errands, shopping? 0  Preparing Food and eating ? N  Using the Toilet? N  In the past six months, have you accidently leaked urine? N  Do you have problems with loss of bowel control? N  Managing your Medications? N  Managing your Finances? N   Housekeeping or managing your Housekeeping? N    Patient Care Team: Willow Ora, MD as PCP - General (Family Medicine) Jens Som Madolyn Frieze, MD as PCP - Cardiology (Cardiology) Elige Ko., MD as Consulting Physician (Orthopedic Surgery) Cindee Salt, MD as Consulting Physician (Orthopedic Surgery) Charna Elizabeth, MD as Consulting Physician (Gastroenterology) Merwyn Katos, DPM as Consulting Physician (Podiatry) Jens Som, Madolyn Frieze, MD as Consulting Physician (Cardiology) Diamantina Monks Lise Auer, NP (Nurse Practitioner) Alfredo Martinez, MD as Consulting Physician (Urology) Caryl Pina, PA-C as Consulting Physician (Dermatology) Duke, Roe Rutherford, PA as Physician Assistant (Cardiology)  Indicate any recent Medical Services you may have received from other than [facility] providers in the past year (date may be approximate).     Assessment:   This is a routine wellness examination for Erin West.  Hearing/Vision screen Hearing Screening - Comments:: Pt denies any hearing issues  Vision Screening - Comments:: Pt follows up with Dr Caryn Section for annual eye exams   Dietary issues and exercise activities discussed:     Goals Addressed             This Visit's Progress    Patient Stated       Lose weight        Depression Screen    03/19/2023    2:47 PM 01/29/2023    9:32 AM 12/24/2022    8:38 AM 06/07/2022   11:43 AM 01/12/2022   12:47 PM 12/26/2021   12:05 PM 10/30/2021    1:34 PM  PHQ 2/9 Scores  PHQ - 2 Score 0 0 0 0 0 0 0  PHQ- 9 Score     0      Fall Risk    03/19/2023    2:48 PM 01/29/2023    9:32 AM 12/24/2022    8:38 AM 06/07/2022   11:42 AM 12/26/2021   12:10 PM  Fall Risk   Falls in the past year? 0 0 0 0 0  Number falls in past yr: 0 0 0  0 0  Injury with Fall? 0 0 0 0 0  Risk for fall due to : Impaired vision No Fall Risks No Fall Risks No Fall Risks Impaired vision  Follow up Falls prevention discussed Falls evaluation completed Falls evaluation completed Falls  evaluation completed Falls prevention discussed    MEDICARE RISK AT HOME:  Medicare Risk at Home - 03/19/23 1449     Any stairs in or around the home? No    If so, are there any without handrails? No    Home free of loose throw rugs in walkways, pet beds, electrical cords, etc? Yes    Adequate lighting in your home to reduce risk of falls? Yes    Life alert? No    Use of a cane, walker or w/c? No    Grab bars in the bathroom? Yes    Shower chair or bench in shower? No    Elevated toilet seat or a handicapped toilet? No             TIMED UP AND GO:  Was the test performed?  No    Cognitive Function:    10/22/2018    8:39 AM  MMSE - Mini Mental State Exam  Orientation to time 5  Orientation to Place 5  Registration 3  Attention/ Calculation 5  Recall 3  Language- name 2 objects 2  Language- repeat 1  Language- follow 3 step command 3  Language- read & follow direction 1  Write a sentence 1  Copy design 1  Total score 30        03/19/2023    2:49 PM 12/26/2021   12:12 PM 10/29/2019    3:08 PM  6CIT Screen  What Year? 0 points 0 points 0 points  What month? 0 points 0 points 0 points  What time? 0 points 0 points 0 points  Count back from 20 0 points 0 points 0 points  Months in reverse 0 points 0 points 0 points  Repeat phrase 0 points 2 points 0 points  Total Score 0 points 2 points 0 points    Immunizations Immunization History  Administered Date(s) Administered   COVID-19, mRNA, vaccine(Comirnaty)12 years and older 01/02/2023   Fluad Quad(high Dose 65+) 07/15/2021, 06/07/2022   Influenza Split 06/24/2013   Influenza, High Dose Seasonal PF 05/23/2016, 06/28/2017, 06/12/2018, 05/17/2019   Influenza, Seasonal, Injecte, Preservative Fre 05/27/2014, 07/11/2015   Influenza,inj,quad, With Preservative 06/25/2019   PFIZER(Purple Top)SARS-COV-2 Vaccination 10/30/2019, 11/24/2019, 06/22/2020, 12/28/2020   Pfizer Covid-19 Vaccine Bivalent Booster 64yrs & up  07/15/2021   Pneumococcal Conjugate-13 10/29/2016   Pneumococcal Polysaccharide-23 07/25/2012, 11/06/2017, 05/17/2019   Tdap 09/24/2010, 12/19/2015   Zoster Recombinat (Shingrix) 08/09/2017, 11/06/2017   Zoster, Live 09/25/2011    TDAP status: Up to date  Flu Vaccine status: Up to date  Pneumococcal vaccine status: Up to date  Covid-19 vaccine status: Completed vaccines  Qualifies for Shingles Vaccine? Yes   Zostavax completed Yes   Shingrix Completed?: Yes  Screening Tests Health Maintenance  Topic Date Due   COVID-19 Vaccine (7 - 2023-24 season) 02/27/2023   INFLUENZA VACCINE  04/25/2023   MAMMOGRAM  12/24/2023   Medicare Annual Wellness (AWV)  03/18/2024   DTaP/Tdap/Td (3 - Td or Tdap) 12/18/2025   DEXA SCAN  01/10/2026   Colonoscopy  02/08/2032   Pneumonia Vaccine 11+ Years old  Completed   Hepatitis C Screening  Completed   Zoster Vaccines- Shingrix  Completed   HPV VACCINES  Aged Out  Health Maintenance  Health Maintenance Due  Topic Date Due   COVID-19 Vaccine (7 - 2023-24 season) 02/27/2023    Colorectal cancer screening: Type of screening: Colonoscopy. Completed 02/07/22. Repeat every 10 years  Mammogram status: Completed 12/24/22. Repeat every year  Bone Density status: Completed 01/11/23. Results reflect: Bone density results: OSTEOPENIA. Repeat every 2 years.   Additional Screening:  Hepatitis C Screening: Completed 10/16/16  Vision Screening: Recommended annual ophthalmology exams for early detection of glaucoma and other disorders of the eye. Is the patient up to date with their annual eye exam?  Yes  Who is the provider or what is the name of the office in which the patient attends annual eye exams? [facility]  If pt is not established with a provider, would they like to be referred to a provider to establish care? No .   Dental Screening: Recommended annual dental exams for proper oral hygiene   Community Resource Referral / Chronic Care  Management: CRR required this visit?  No   CCM required this visit?  No     Plan:     I have personally reviewed and noted the following in the patient's chart:   Medical and social history Use of alcohol, tobacco or illicit drugs  Current medications and supplements including opioid prescriptions. Patient is not currently taking opioid prescriptions. Functional ability and status Nutritional status Physical activity Advanced directives List of other physicians Hospitalizations, surgeries, and ER visits in previous 12 months Vitals Screenings to include cognitive, depression, and falls Referrals and appointments  In addition, I have reviewed and discussed with patient certain preventive protocols, quality metrics, and best practice recommendations. A written personalized care plan for preventive services as well as general preventive health recommendations were provided to patient.     Marzella Schlein, LPN   4/78/2956   After Visit Summary: (MyChart) Due to this being a telephonic visit, the after visit summary with patients personalized plan was offered to patient via MyChart   Nurse Notes: none

## 2023-05-23 DIAGNOSIS — M67951 Unspecified disorder of synovium and tendon, right thigh: Secondary | ICD-10-CM | POA: Diagnosis not present

## 2023-05-23 DIAGNOSIS — M7061 Trochanteric bursitis, right hip: Secondary | ICD-10-CM | POA: Diagnosis not present

## 2023-06-11 DIAGNOSIS — J069 Acute upper respiratory infection, unspecified: Secondary | ICD-10-CM | POA: Diagnosis not present

## 2023-06-11 DIAGNOSIS — R051 Acute cough: Secondary | ICD-10-CM | POA: Diagnosis not present

## 2023-06-11 DIAGNOSIS — Z6833 Body mass index (BMI) 33.0-33.9, adult: Secondary | ICD-10-CM | POA: Diagnosis not present

## 2023-06-13 ENCOUNTER — Emergency Department (HOSPITAL_BASED_OUTPATIENT_CLINIC_OR_DEPARTMENT_OTHER)
Admission: EM | Admit: 2023-06-13 | Discharge: 2023-06-13 | Disposition: A | Discharge status: Home / Self Care | Payer: Medicare Other | Attending: Emergency Medicine | Admitting: Emergency Medicine

## 2023-06-13 ENCOUNTER — Telehealth: Payer: Self-pay | Admitting: Family Medicine

## 2023-06-13 ENCOUNTER — Other Ambulatory Visit: Payer: Self-pay

## 2023-06-13 ENCOUNTER — Encounter (HOSPITAL_BASED_OUTPATIENT_CLINIC_OR_DEPARTMENT_OTHER): Payer: Self-pay | Admitting: Emergency Medicine

## 2023-06-13 DIAGNOSIS — D649 Anemia, unspecified: Secondary | ICD-10-CM | POA: Diagnosis not present

## 2023-06-13 DIAGNOSIS — R195 Other fecal abnormalities: Secondary | ICD-10-CM

## 2023-06-13 DIAGNOSIS — U071 COVID-19: Secondary | ICD-10-CM | POA: Diagnosis not present

## 2023-06-13 DIAGNOSIS — R059 Cough, unspecified: Secondary | ICD-10-CM | POA: Diagnosis present

## 2023-06-13 DIAGNOSIS — K921 Melena: Secondary | ICD-10-CM | POA: Diagnosis not present

## 2023-06-13 LAB — CBC
HCT: 32.1 % — ABNORMAL LOW (ref 36.0–46.0)
Hemoglobin: 10.8 g/dL — ABNORMAL LOW (ref 12.0–15.0)
MCH: 30.8 pg (ref 26.0–34.0)
MCHC: 33.6 g/dL (ref 30.0–36.0)
MCV: 91.5 fL (ref 80.0–100.0)
Platelets: 168 10*3/uL (ref 150–400)
RBC: 3.51 MIL/uL — ABNORMAL LOW (ref 3.87–5.11)
RDW: 13.4 % (ref 11.5–15.5)
WBC: 2 10*3/uL — ABNORMAL LOW (ref 4.0–10.5)
nRBC: 0 % (ref 0.0–0.2)

## 2023-06-13 LAB — BASIC METABOLIC PANEL
Anion gap: 8 (ref 5–15)
BUN: 14 mg/dL (ref 8–23)
CO2: 27 mmol/L (ref 22–32)
Calcium: 9.1 mg/dL (ref 8.9–10.3)
Chloride: 103 mmol/L (ref 98–111)
Creatinine, Ser: 0.85 mg/dL (ref 0.44–1.00)
GFR, Estimated: >60 mL/min (ref >60)
Glucose, Bld: 110 mg/dL — ABNORMAL HIGH (ref 70–99)
Potassium: 3.8 mmol/L (ref 3.5–5.1)
Sodium: 138 mmol/L (ref 135–145)

## 2023-06-13 LAB — OCCULT BLOOD X 1 CARD TO LAB, STOOL: Fecal Occult Bld: NEGATIVE

## 2023-06-13 NOTE — ED Triage Notes (Signed)
Urinary frequency as well as some lower abdominal pain.  Pt also has Covid that was diagnosed about 5 days ago.  Pt reports that seems to be okay but she was concerned about her urinary symptoms.

## 2023-06-13 NOTE — Telephone Encounter (Signed)
Noted  

## 2023-06-13 NOTE — Telephone Encounter (Signed)
FYI: This call has been transferred to triage nurse: Access Nurse. Once the result note has been entered staff can address the message at that time.  Patient called in with the following symptoms:  Red Word: Black stool x 2 days, tested positive for COVID today   Please advise at Mobile [phone] (mobile)  Message is routed to Provider Pool.

## 2023-06-13 NOTE — ED Provider Notes (Signed)
[place] EMERGENCY DEPARTMENT AT [facility] Provider Note   CSN: [id number] Arrival date & time: 06/13/23  1854     History  Chief Complaint  Patient presents with   Urinary Frequency    Erin West is a 72 y.o. female.  72 year old female with a history of DVT not currently on anticoagulation and colonic polyps who presents to the emergency department with dark stool.  Patient reports for the past several days has been having black stools.  Had one yesterday and one the day before. Did take Pepto-Bismol several days ago after returning home from a cruise.  Not on iron.  Not on anticoagulation.  Had a colonoscopy last year that showed some polyps that were biopsied.  No heavy NSAID or alcohol use.  Was recently diagnosed with COVID and has been having a cough, congestion, runny nose and a low-grade fever for 2 days.  Did have a positive COVID test at urgent care today before they referred her to the emergency department due to her black stools.  Says that she is feeling generally weak but attributes this to the COVID infection.  No significant shortness of breath and no chest pain.  Also reports increased urinary frequency recently.       Home Medications Prior to Admission medications   Medication Sig Start Date End Date Taking? Authorizing Provider  acetaminophen (TYLENOL) 650 MG CR tablet Take 650 mg by mouth every 8 (eight) hours as needed for pain.    [provider]  albuterol (VENTOLIN HFA) 108 (90 Base) MCG/ACT inhaler Inhale 2 puffs into the lungs every 4 (four) hours as needed for wheezing or shortness of breath. 02/06/21   Willow Ora, MD  atorvastatin (LIPITOR) 20 MG tablet TAKE 1 TABLET AT BEDTIME 10/22/22   Willow Ora, MD  carvedilol (COREG) 25 MG tablet TAKE 1 TABLET TWICE A DAY 01/09/23   Lewayne Bunting, MD  cetirizine (ZYRTEC) 10 MG tablet TAKE 1 TABLET DAILY 02/25/23   Willow Ora, MD  Cholecalciferol (VITAMIN D) 2000 UNITS CAPS Take  2,000 Units by mouth daily.     [provider]  diclofenac Sodium (VOLTAREN) 1 % GEL Apply 2 g topically 4 (four) times daily as needed (arthritis pain, hands). 06/07/22   Willow Ora, MD  famotidine (PEPCID) 40 MG tablet Take 40 mg by mouth 2 (two) times daily. 11/12/21   [provider]  fluticasone (FLONASE) 50 MCG/ACT nasal spray Place 2 sprays into both nostrils daily. 12/19/21   Martina Sinner, MD  ipratropium (ATROVENT) 0.03 % nasal spray Place 2 sprays into both nostrils 2 (two) times daily as needed for rhinitis. 12/19/21   Martina Sinner, MD  LINZESS 290 MCG CAPS capsule Take 290 mcg by mouth daily. 02/16/22   [provider]  montelukast (SINGULAIR) 10 MG tablet TAKE 1 TABLET AT BEDTIME 10/22/22   Willow Ora, MD  Multiple Vitamin (MULTIVITAMIN) tablet Take 1 tablet by mouth daily.    [provider]  pantoprazole (PROTONIX) 40 MG tablet Take 40 mg by mouth daily. 10/14/21   [provider]  polyethylene glycol powder (GLYCOLAX/MIRALAX) powder Take 17 g by mouth daily as needed. 12/13/17   Willow Ora, MD  Probiotic Product (PROBIOTIC PO) Take 1 capsule by mouth daily.     [provider]  solifenacin (VESICARE) 5 MG tablet Take 5 mg by mouth daily.    [provider]      Allergies  Penicillins, Sulfa antibiotics, Sulfasalazine, Tramadol, Ciprofloxacin, Trimethoprim, and Isovue [iopamidol]    Review of Systems   Review of Systems  Physical Exam Updated Vital Signs BP (!) 145/121   Pulse 74   Temp 98.4 F (36.9 C) (Oral)   Resp 16   LMP 09/24/2012 (Approximate)   SpO2 96%  Physical Exam Vitals and nursing note reviewed.  Constitutional:      General: She is not in acute distress.    Appearance: She is well-developed.  HENT:     Head: Normocephalic and atraumatic.     Right Ear: External ear normal.     Left Ear: External ear normal.     Nose: Nose normal.  Eyes:     Extraocular Movements:  Extraocular movements intact.     Conjunctiva/sclera: Conjunctivae normal.     Pupils: Pupils are equal, round, and reactive to light.  Cardiovascular:     Rate and Rhythm: Normal rate and regular rhythm.     Heart sounds: No murmur heard. Pulmonary:     Effort: Pulmonary effort is normal. No respiratory distress.     Breath sounds: Normal breath sounds.  Abdominal:     General: Abdomen is flat. There is no distension.     Palpations: Abdomen is soft. There is no mass.     Tenderness: There is no abdominal tenderness. There is no guarding.  Genitourinary:    Comments: Chaperoned by patient's RN Okey Regal. No external hemorrhoids or fissures noted on external inspection. No internal masses or hemorroids noted. Normal rectal tone. Brown stool in rectal vault. No melena or gross blood.  Musculoskeletal:     Cervical back: Normal range of motion and neck supple.     Right lower leg: No edema.     Left lower leg: No edema.  Skin:    General: Skin is warm and dry.  Neurological:     Mental Status: She is alert and oriented to person, place, and time. Mental status is at baseline.  Psychiatric:        Mood and Affect: Mood normal.     ED Results / Procedures / Treatments   Labs (all labs ordered are listed, but only abnormal results are displayed) Labs Reviewed  CBC - Abnormal; Notable for the following components:      Result Value   WBC 2.0 (*)    RBC 3.51 (*)    Hemoglobin 10.8 (*)    HCT 32.1 (*)    All other components within normal limits  BASIC METABOLIC PANEL - Abnormal; Notable for the following components:   Glucose, Bld 110 (*)    All other components within normal limits  OCCULT BLOOD X 1 CARD TO LAB, STOOL    EKG None  Radiology No results found.  Procedures Procedures    Medications Ordered in ED Medications - No data to display  ED Course/ Medical Decision Making/ A&P                                 Medical Decision Making Amount and/or Complexity  of Data Reviewed Labs: ordered.   Erin West is a 72 y.o. female with comorbidities that complicate the patient evaluation including DVT not currently on anticoagulation who presents to the emergency department dark stools  Initial Ddx:  Upper GI bleed, Pepto-Bismol side effect, COVID  MDM/Course:  Patient resents emergency department with dark stools after taking Pepto-Bismol.  Does have  some generalized weakness but she attributes this to COVID which she tested positive for today.  Did have a colonoscopy last year per the patient and had some polyps removed.  No heavy NSAID or alcohol use.  No significant upper abdominal tenderness to palpation.  She appears overall very stable in the emergency department.  Did have a Hemoccult test that was negative.  CBC returned and did have a drop in her hemoglobin from 13.5 previously to 10.8 today.  Upon re-evaluation patient remained stable.  We discussed her lab findings.  Since she is overall well-appearing and is recovering from COVID which likely is causing her fatigue and she has a negative Hemoccult here and is not on anticoagulation will have her follow-up with her primary doctor in several days for repeat hemoglobin.  May potentially need an outpatient endoscopy if she is found to be persistently anemic.  Did discuss return precautions with the patient prior to discharge.  This patient presents to the ED for concern of complaints listed in HPI, this involves an extensive number of treatment options, and is a complaint that carries with it a high risk of complications and morbidity. Disposition including potential need for admission considered.   Dispo: DC Home. Return precautions discussed including, but not limited to, those listed in the AVS. Allowed pt time to ask questions which were answered fully prior to dc.  Records reviewed Outpatient Clinic Notes The following labs were independently interpreted: CBC and show  anemia I have reviewed  the patients home medications and made adjustments as needed Social Determinants of health:  Elderly         Final Clinical Impression(s) / ED Diagnoses Final diagnoses:  Anemia, unspecified type  Dark stools  COVID    Rx / DC Orders ED Discharge Orders     None         Rondel Baton, MD 06/14/23 1108

## 2023-06-13 NOTE — Discharge Instructions (Signed)
You were seen for your dark stools in the emergency department.   Follow-up with your primary doctor in 1 week regarding your visit.   Return immediately to the emergency department if you experience any of the following: more dark stools, bloody stools, shortness of breath, fainting, or any other concerning symptoms.    Thank you for visiting our Emergency Department. It was a pleasure taking care of you today.

## 2023-06-14 NOTE — Telephone Encounter (Signed)
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 Black tarry stools for the last 2 days. Translation No Nurse Assessment Nurse: Delford Field, RN, Ivin Booty Date/Time (Eastern Time): 06/13/2023 3:49:10 PM Confirm and document reason for call. If symptomatic, describe symptoms. ---Caller states that she has had black tarry stools for the last 2 days, and is positive for covid-19 today. Does the patient have any new or worsening symptoms? ---Yes Will a triage be completed? ---Yes Related visit to physician within the last 2 weeks? ---No Does the PT have any chronic conditions? (i.e. diabetes, asthma, this includes High risk factors for pregnancy, etc.) ---Yes List chronic conditions. ---reflux, HTN Is this a behavioral health or substance abuse call? ---No Guidelines Guideline Title Affirmed Question Affirmed Notes Nurse Date/Time (Eastern Time) Rectal Bleeding Black or tarry bowel movements (Exception: Chronicunchanged black-grey BMs AND is taking iron pills or PeptoBismol.) Delford Field, RN, Ivin Booty 06/13/2023 3:50:21 PM Disp. Time Lamount Cohen Time) Disposition Final User 06/13/2023 3:53:55 PM Go to ED Now Yes Delford Field, RN, Ivin Booty Final Disposition 06/13/2023 3:53:55 PM Go to ED Now Yes Delford Field, RN, Madelynn Done Disagree/Comply Comply Caller Understands  Yes PreDisposition Call Doctor Care Advice Given Per Guideline GO TO ED NOW: CARE ADVICE given per Rectal Bleeding (Adult) guideline. Referrals GO TO FACILITY UNDECIDED

## 2023-06-14 NOTE — Telephone Encounter (Signed)
Noted  

## 2023-06-14 NOTE — Telephone Encounter (Signed)
Pt went to UC but unable to test for E.Coli. Advised to go to ED. Was seen at [facility] on 06/13/23.  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 She states she just got news that she needs to be tested for E.coli. One of the people she was traveling with tested positive for it. She has been having dark stools. She needs to know where to get tested. The urgent care she went to does not do it and neither does the hospital. Translation No Nurse Assessment Nurse: Cox, RN, Allicon Date/Time (Eastern Time): 06/13/2023 5:17:32 PM Confirm and document reason for call. If symptomatic, describe symptoms. ---Caller states she tested positive for Covid today. She was in contact with a person who has e coli and and she has dark colored stools. She has cough and congestion that started on Sunday. Does the patient have any new or worsening symptoms? ---Yes Will a triage be completed? ---Yes Related visit to physician within the last 2 weeks? ---No Does the PT have any chronic conditions? (i.e. diabetes, asthma, this includes High risk factors for pregnancy, etc.) ---Yes List chronic conditions. ---HTN, acid reflux Is this a behavioral health or substance abuse call? ---No Guidelines Guideline Title Affirmed Question Affirmed Notes Nurse Date/Time Lamount Cohen Time) Rectal  Bleeding Black or tarry bowel movements (Exception: ChronicCox, RN, Allicon 06/13/2023 5:20:07 PM Guidelines Guideline Title Affirmed Question Affirmed Notes Nurse Date/Time (Eastern Time) unchanged black-grey BMs AND is taking iron pills or PeptoBismol.) Disp. Time Lamount Cohen Time) Disposition Final User 06/13/2023 5:23:34 PM Go to ED Now Yes Cox, RN, Allicon Final Disposition 06/13/2023 5:23:34 PM Go to ED Now Yes Cox, RN, Allicon Caller Disagree/Comply Comply Caller Understands Yes PreDisposition InappropriateToAsk Care Advice Given Per Guideline GO TO ED NOW: * You need to be seen in the Emergency Department. CARE ADVICE given per Rectal Bleeding (Adult) guideline. Referrals [facility] - ED

## 2023-06-21 ENCOUNTER — Ambulatory Visit: Payer: Medicare Other | Admitting: Family Medicine

## 2023-06-27 ENCOUNTER — Ambulatory Visit: Payer: Medicare Other | Admitting: Family Medicine

## 2023-07-01 ENCOUNTER — Ambulatory Visit: Payer: Medicare Other | Admitting: Family Medicine

## 2023-07-03 ENCOUNTER — Ambulatory Visit (INDEPENDENT_AMBULATORY_CARE_PROVIDER_SITE_OTHER): Payer: Medicare Other | Admitting: Family Medicine

## 2023-07-03 ENCOUNTER — Encounter: Payer: Self-pay | Admitting: Family Medicine

## 2023-07-03 VITALS — BP 172/109 | HR 74 | Temp 98.1°F | Ht 63.0 in | Wt 190.8 lb

## 2023-07-03 DIAGNOSIS — D649 Anemia, unspecified: Secondary | ICD-10-CM | POA: Diagnosis not present

## 2023-07-03 DIAGNOSIS — K219 Gastro-esophageal reflux disease without esophagitis: Secondary | ICD-10-CM | POA: Diagnosis not present

## 2023-07-03 DIAGNOSIS — U071 COVID-19: Secondary | ICD-10-CM

## 2023-07-03 DIAGNOSIS — I10 Essential (primary) hypertension: Secondary | ICD-10-CM

## 2023-07-03 NOTE — Progress Notes (Signed)
Subjective  CC:  Chief Complaint  Patient presents with   Hypertension    HPI: Erin West is a 72 y.o. female who presents to the office today to address the problems listed above in the chief complaint. 71 yo: ED visit last month with new anemia. Hem neg stool at visit. No abdominal pain. Stools are soft and brown. Had been dxd with covid 48 hours eariler. On chronic PPI.  Covid: improving. Congested cough persists but is slowly improving. Fatigue has gotten better (see last visit).   Feels well: worried about anemia. Bp is very high is office today. Was elevated in ER as well but prior had been well controlled and home readings remain normal. On carvedilol bid. Compliant. No cp, sob, edema or palpitations.  Assessment  1. Anemia, unspecified type   2. Essential hypertension   3. COVID-19   4. Gastroesophageal reflux disease without esophagitis      Plan  anemia:  start work up. ? B12 deficiency, iron deficiency or other.  HTN: ? Falsely elevated due to worry: pt to monitor at home. If remains elevated, will adjust medications. If cp, sob or palpitaitons or HA to ER.  Covid 19: improving. GERd on ppi and h2 blocker. If iron deficient, will need EGD eval per GI  Follow up: 6 mof for cpe Visit date not found  Orders Placed This Encounter  Procedures   CBC with Differential/Platelet   Iron, TIBC and Ferritin Panel   B12 and Folate Panel   No orders of the defined types were placed in this encounter.     I reviewed the patients updated PMH, FH, and SocHx.    Patient Active Problem List   Diagnosis Date Noted   Chronic prescription benzodiazepine use 09/20/2021    Priority: High   Depression, major, single episode, moderate (HCC) 11/02/2020    Priority: High   Mixed hyperlipidemia 12/04/2017    Priority: High   Chronic cough 07/24/2017    Priority: High   Essential hypertension     Priority: High   Osteoarthritis of left glenohumeral joint 04/19/2020     Priority: Medium    OAB (overactive bladder) 10/28/2019    Priority: Medium    Chronic seasonal allergic rhinitis 12/04/2017    Priority: Medium    Chronic pansinusitis 09/09/2017    Priority: Medium    Laryngopharyngeal reflux (LPR) 09/09/2017    Priority: Medium    Bilateral leg edema 03/30/2014    Priority: Medium    History of gastritis 02/10/2014    Priority: Medium    Dysphagia 01/28/2014    Priority: Medium    Irritable bowel syndrome (IBS) 09/08/2013    Priority: Medium    Osteopenia 07/14/2013    Priority: Medium    Osteoarthritis, multiple sites 07/14/2013    Priority: Medium    Gastroesophageal reflux disease without esophagitis     Priority: Medium    Atrophic vaginitis 10/22/2018    Priority: Low   Status post total right knee replacement 01/24/2016    Priority: Low   Asthma 01/18/2022   Hemoptysis 01/18/2022   Current Meds  Medication Sig   acetaminophen (TYLENOL) 650 MG CR tablet Take 650 mg by mouth every 8 (eight) hours as needed for pain.   albuterol (VENTOLIN HFA) 108 (90 Base) MCG/ACT inhaler Inhale 2 puffs into the lungs every 4 (four) hours as needed for wheezing or shortness of breath.   atorvastatin (LIPITOR) 20 MG tablet TAKE 1 TABLET AT BEDTIME  carvedilol (COREG) 25 MG tablet TAKE 1 TABLET TWICE A DAY   cetirizine (ZYRTEC) 10 MG tablet TAKE 1 TABLET DAILY   Cholecalciferol (VITAMIN D) 2000 UNITS CAPS Take 2,000 Units by mouth daily.    diclofenac Sodium (VOLTAREN) 1 % GEL Apply 2 g topically 4 (four) times daily as needed (arthritis pain, hands).   famotidine (PEPCID) 40 MG tablet Take 40 mg by mouth 2 (two) times daily.   fluticasone (FLONASE) 50 MCG/ACT nasal spray Place 2 sprays into both nostrils daily.   ipratropium (ATROVENT) 0.03 % nasal spray Place 2 sprays into both nostrils 2 (two) times daily as needed for rhinitis.   LINZESS 290 MCG CAPS capsule Take 290 mcg by mouth daily.   montelukast (SINGULAIR) 10 MG tablet TAKE 1 TABLET AT  BEDTIME   Multiple Vitamin (MULTIVITAMIN) tablet Take 1 tablet by mouth daily.   pantoprazole (PROTONIX) 40 MG tablet Take 40 mg by mouth daily.   polyethylene glycol powder (GLYCOLAX/MIRALAX) powder Take 17 g by mouth daily as needed.   Probiotic Product (PROBIOTIC PO) Take 1 capsule by mouth daily.    solifenacin (VESICARE) 5 MG tablet Take 5 mg by mouth daily.    Allergies: Patient is allergic to penicillins, sulfa antibiotics, sulfasalazine, tramadol, ciprofloxacin, trimethoprim, and isovue [iopamidol]. Family History: Patient family history includes Arthritis in her father and mother; Heart disease in her mother; Mental illness in her father; Osteoporosis in her mother; Other in her mother; Stroke in her paternal grandfather; Sudden death in her father; Thyroid disease in her father. Social History:  Patient  reports that she has never smoked. She has never used smokeless tobacco. She reports current alcohol use. She reports that she does not use drugs.  Review of Systems: Constitutional: Negative for fever malaise or anorexia Cardiovascular: negative for chest pain Respiratory: negative for SOB or persistent cough Gastrointestinal: negative for abdominal pain  Objective  Vitals: BP (!) 172/109   Pulse 74   Temp 98.1 F (36.7 C)   Ht 5\' 3"  (1.6 m)   Wt 190 lb 12.8 oz (86.5 kg)   LMP 09/24/2012 (Approximate)   SpO2 96%   BMI 33.80 kg/m  General: no acute distress , A&Ox3, appears well HEENT: PEERL, conjunctiva slightly pale, neck is supple Cardiovascular:  RRR without murmur or gallop. No edema Respiratory:  Good breath sounds bilaterally, CTAB with normal respiratory effort Nontender abd Skin:  Warm, no rashes, pale nail beds  Commons side effects, risks, benefits, and alternatives for medications and treatment plan prescribed today were discussed, and the patient expressed understanding of the given instructions. Patient is instructed to call or message via MyChart if  he/she has any questions or concerns regarding our treatment plan. No barriers to understanding were identified. We discussed Red Flag symptoms and signs in detail. Patient expressed understanding regarding what to do in case of urgent or emergency type symptoms.  Medication list was reconciled, printed and provided to the patient in AVS. Patient instructions and summary information was reviewed with the patient as documented in the AVS. This note was prepared with assistance of Dragon voice recognition software. Occasional wrong-word or sound-a-like substitutions may have occurred due to the inherent limitations of voice recognition software

## 2023-07-04 LAB — CBC WITH DIFFERENTIAL/PLATELET
Basophils Absolute: 0.1 10*3/uL (ref 0.0–0.1)
Basophils Relative: 1.2 % (ref 0.0–3.0)
Eosinophils Absolute: 0.1 10*3/uL (ref 0.0–0.7)
Eosinophils Relative: 3 % (ref 0.0–5.0)
HCT: 42.7 % (ref 36.0–46.0)
Hemoglobin: 14.3 g/dL (ref 12.0–15.0)
Lymphocytes Relative: 32.6 % (ref 12.0–46.0)
Lymphs Abs: 1.6 10*3/uL (ref 0.7–4.0)
MCHC: 33.4 g/dL (ref 30.0–36.0)
MCV: 92.1 fL (ref 78.0–100.0)
Monocytes Absolute: 0.4 10*3/uL (ref 0.1–1.0)
Monocytes Relative: 8.3 % (ref 3.0–12.0)
Neutro Abs: 2.6 10*3/uL (ref 1.4–7.7)
Neutrophils Relative %: 54.9 % (ref 43.0–77.0)
Platelets: 237 10*3/uL (ref 150.0–400.0)
RBC: 4.63 Mil/uL (ref 3.87–5.11)
RDW: 14 % (ref 11.5–15.5)
WBC: 4.8 10*3/uL (ref 4.0–10.5)

## 2023-07-04 LAB — IRON,TIBC AND FERRITIN PANEL
%SAT: 32 % (ref 16–45)
Ferritin: 33 ng/mL (ref 16–288)
Iron: 111 ug/dL (ref 45–160)
TIBC: 342 ug/dL (ref 250–450)

## 2023-07-04 LAB — B12 AND FOLATE PANEL
Folate: 11.7 ng/mL (ref >5.9)
Vitamin B-12: 348 pg/mL (ref 211–911)

## 2023-07-05 NOTE — Progress Notes (Signed)
See mychart note Dear Ms. Parkhurst, Good news: all of your blood test results are normal. I suspect the abnormality was related to the covid infection. Everything looks good now.  Sincerely, Dr. Mardelle Matte

## 2023-07-15 ENCOUNTER — Telehealth: Payer: Self-pay | Admitting: Cardiology

## 2023-07-15 ENCOUNTER — Telehealth: Payer: Self-pay | Admitting: Family Medicine

## 2023-07-15 MED ORDER — CARVEDILOL 25 MG PO TABS: ORAL_TABLET | ORAL | 0 refills | Status: DC

## 2023-07-15 NOTE — Telephone Encounter (Signed)
Pt is requesting 10 day supply of Carvedilol. She has 1 pill left & this script was from Dr. Olga Millers with [facility]. She is asking for a temp refill.  [id number] - [place], [place] - [address] AT [facility] OF [address] Phone: [phone]  Fax: [phone]

## 2023-07-15 NOTE — Telephone Encounter (Signed)
Pt's medication was sent to pt's pharmacy as requested. Confirmation received.  °

## 2023-07-15 NOTE — Telephone Encounter (Signed)
*  STAT* If patient is at the pharmacy, call can be transferred to refill team.   1. Which medications need to be refilled? (please list name of each medication and dose if known)   carvedilol (COREG) 25 MG tablet   2. Would you like to learn more about the convenience, safety, & potential cost savings by using the [facility]?   3. Are you open to using the [facility] (Type [facility]. ).  4. Which pharmacy/location (including street and city if local pharmacy) is medication to be sent to?  [id number] - [place], [place] - [address] AT [facility] OF [address]   5. Do they need a 30 day or 90 day supply?  10 day supply  Patient stated she is completely out of this medication and will need a 10 day supply to last until she gets her full prescription from her mail order.

## 2023-07-16 ENCOUNTER — Telehealth: Payer: Self-pay

## 2023-07-16 NOTE — Telephone Encounter (Signed)
Transition Care Management Unsuccessful Follow-up Telephone Call  Date of discharge and from where:  [place] 9/19  Attempts:  1st Attempt  Reason for unsuccessful TCM follow-up call:  No answer/busy   Lenard Forth [place]  [facility], [facility] Guide, Phone: [phone] Website: [email or web address]

## 2023-07-17 ENCOUNTER — Telehealth: Payer: Self-pay

## 2023-07-17 NOTE — Telephone Encounter (Signed)
Transition Care Management Follow-up Telephone Call Date of discharge and from where: [facility] 9/19 How have you been since you were released from the hospital? Doing fine and has followed up with PCP Any questions or concerns? No  Items Reviewed: Did the pt receive and understand the discharge instructions provided? Yes  Medications obtained and verified? Yes  Other? No  Any new allergies since your discharge? No  Dietary orders reviewed? No Do you have support at home? No    Follow up appointments reviewed:  PCP Hospital f/u appt confirmed? Yes  Scheduled to see  on @ . Specialist Hospital f/u appt confirmed? No  Scheduled to see  on  @ . Are transportation arrangements needed? No  If their condition worsens, is the pt aware to call PCP or go to the Emergency Dept.? Yes Was the patient provided with contact information for the PCP's office or ED? Yes Was to pt encouraged to call back with questions or concerns? Yes

## 2023-07-17 NOTE — Telephone Encounter (Signed)
Filled by dr Waunita Schooner office.

## 2023-08-12 DIAGNOSIS — H938X3 Other specified disorders of ear, bilateral: Secondary | ICD-10-CM | POA: Diagnosis not present

## 2023-08-30 DIAGNOSIS — H90A22 Sensorineural hearing loss, unilateral, left ear, with restricted hearing on the contralateral side: Secondary | ICD-10-CM | POA: Diagnosis not present

## 2023-08-30 DIAGNOSIS — H9202 Otalgia, left ear: Secondary | ICD-10-CM | POA: Diagnosis not present

## 2023-09-02 DIAGNOSIS — R0982 Postnasal drip: Secondary | ICD-10-CM | POA: Diagnosis not present

## 2023-09-02 DIAGNOSIS — H9313 Tinnitus, bilateral: Secondary | ICD-10-CM | POA: Diagnosis not present

## 2023-09-02 DIAGNOSIS — H906 Mixed conductive and sensorineural hearing loss, bilateral: Secondary | ICD-10-CM | POA: Diagnosis not present

## 2023-09-02 DIAGNOSIS — G4733 Obstructive sleep apnea (adult) (pediatric): Secondary | ICD-10-CM | POA: Diagnosis not present

## 2023-09-05 ENCOUNTER — Other Ambulatory Visit (HOSPITAL_BASED_OUTPATIENT_CLINIC_OR_DEPARTMENT_OTHER): Payer: Self-pay

## 2023-09-05 DIAGNOSIS — Z23 Encounter for immunization: Secondary | ICD-10-CM | POA: Diagnosis not present

## 2023-09-05 MED ORDER — INFLUENZA VAC A&B SURF ANT ADJ 0.5 ML IM SUSY
0.5000 mL | PREFILLED_SYRINGE | Freq: Once | INTRAMUSCULAR | 0 refills | Status: AC
  Filled 2023-09-05: qty 0.5, 1d supply, fill #0

## 2023-09-05 MED ORDER — COVID-19 MRNA VAC-TRIS(PFIZER) 30 MCG/0.3ML IM SUSY
0.3000 mL | PREFILLED_SYRINGE | Freq: Once | INTRAMUSCULAR | 0 refills | Status: AC
  Filled 2023-09-05: qty 0.3, 1d supply, fill #0

## 2023-09-24 ENCOUNTER — Other Ambulatory Visit: Payer: Self-pay | Admitting: Family Medicine

## 2023-09-30 ENCOUNTER — Ambulatory Visit: Payer: Medicare Other | Admitting: Family Medicine

## 2023-10-07 ENCOUNTER — Ambulatory Visit (INDEPENDENT_AMBULATORY_CARE_PROVIDER_SITE_OTHER): Payer: Medicare Other | Admitting: Family Medicine

## 2023-10-07 ENCOUNTER — Encounter: Payer: Self-pay | Admitting: Family Medicine

## 2023-10-07 VITALS — BP 127/85 | HR 70 | Temp 97.0°F | Ht 63.0 in | Wt 193.0 lb

## 2023-10-07 DIAGNOSIS — K219 Gastro-esophageal reflux disease without esophagitis: Secondary | ICD-10-CM

## 2023-10-07 DIAGNOSIS — R053 Chronic cough: Secondary | ICD-10-CM

## 2023-10-07 MED ORDER — AZELASTINE HCL 0.1 % NA SOLN: 1.0000 | Freq: Two times a day (BID) | NASAL | 5 refills | Status: DC

## 2023-10-07 NOTE — Patient Instructions (Signed)
 Please return in 3 months for your annual complete physical; please come fasting.    If you have any questions or concerns, please don't hesitate to send me a message via MyChart or call the office at [phone]. Thank you for visiting with us  today! It's our pleasure caring for you.   VISIT SUMMARY:  During today's visit, we discussed your persistent cough, dry mouth, short-term memory concerns, and a recent episode of back pain. We reviewed your symptoms and current treatments, and I provided recommendations to help manage these issues.  YOUR PLAN:  -CHRONIC COUGH: A chronic cough is a cough that lasts for an extended period, often due to underlying conditions such as sinus drainage. We will review and adjust your allergy medications, refill your nasal sprays, and ensure you know how to use them properly.  -DRY MOUTH: Dry mouth occurs when your mouth doesn't produce enough saliva, which can be uncomfortable and affect your oral health. We will review your current medications to identify any that might be causing this issue and recommend using Vaseline on your lips at night.  -SHORT-TERM MEMORY LOSS: Short-term memory loss involves frequently forgetting recent events or tasks. This may be related to attention and focus. We discussed the potential benefits of omega-3 supplements and engaging in brain-stimulating activities like memory games and puzzles.  -MUSCLE PAIN: Muscle pain can result from overuse, strain, or other factors. Your recent back pain has resolved, so no immediate action is required.  INSTRUCTIONS:  Please follow up with us  if your symptoms persist or worsen. Make sure to use your nasal sprays as directed and consider incorporating omega-3 supplements and brain-stimulating activities into your routine. If you have any questions about your medications or treatments, do not hesitate to contact us .

## 2023-10-07 NOTE — Progress Notes (Signed)
 Subjective  CC:  Chief Complaint  Patient presents with   Back Pain    Pt c/o of pulling back last Friday during her sleep, has subsided    Cough    Pt c.o of cough for 1 month, pt has hx of bronchitis , when pt lays down she coughs, does not c/o of phelgm, cough occurs during the day as well. No other sx, has tried Mucinex , does not help. No chest pain    HPI: Erin West is a 73 y.o. female who presents to the office today to address the problems listed above in the chief complaint. Discussed the use of AI scribe software for clinical note transcription with the patient, who gave verbal consent to proceed.  History of Present Illness   The patient presents with a persistent cough of approximately one month's duration. She describes the cough as different from her usual, noting it does not seem to originate from the chest. The cough has persisted despite the use of Mucinex . The patient denies feeling unwell otherwise and does not report any associated symptoms such as fever or chest pain. She has a history of COVID-19 infection in September, but it is unclear if this is related to the current cough.  The patient also reports increased sinus drainage, particularly noticeable when lying down, which may be contributing to the cough. She is currently using a nasal spray for allergies but is unsure of the name and whether she is taking all prescribed medications.  In addition to the cough, the patient expresses concern about potential short-term memory loss. She reports frequent instances of forgetting why she entered a room, which she feels is happening more often than in the past. However, she does not report any issues with more complex tasks such as remembering passwords or driving.  The patient also mentions a recent episode of back pain, located under the shoulder blade, which resolved after about a week. She used a heating pad extensively for relief.  Lastly, the patient reports a  significant issue with dry mouth, particularly at night, which has been worsening over the past six months. She is currently using over-the-counter Biotene for relief and also has a humidifier in her bedroom.       Assessment  1. Chronic cough   2. Laryngopharyngeal reflux (LPR)      Plan  Assessment and Plan    Chronic Cough/ multifactorial allergy and LPR. No sxs of infection at this time. Consider sinus infection if not improving. Persistent cough for one month, exacerbated by lying down, likely due to sinus drainage. No signs of infection or sinus pain. History of COVID-19 in September. Uses Mucinex  without relief and nasal sprays with uncertain usage and dosage. - Review and adjust allergy medications, continue singulair  and zyrtec  - Refill nasal sprays, astelin  and continue flonase  - Educate on proper usage of nasal sprays - increase protonix  to bid x 4 weeks.   Dry Mouth Severe dry mouth, especially at night, worsened over six months. Uses over-the-counter mouth moisturizers, humidifier, Biotene, and Vaseline for lips. Sleeps with mouth open. - Review current medications for potential culprits - Recommend Vaseline for lips at night  Short-term Memory Loss Reassured; more focus related. - Consider omega-3 supplements - Encourage brain-stimulating activities such as memory games and puzzles  Muscle Pain Intermittent muscle pain under the shoulder blade, resolved with heating pad use. No current pain reported. - No immediate action required.        No  orders of the defined types were placed in this encounter.  Meds ordered this encounter  Medications   azelastine  (ASTELIN ) 0.1 % nasal spray    Sig: Place 1 spray into both nostrils 2 (two) times daily.    Dispense:  30 mL    Refill:  5     I reviewed the patients updated PMH, FH, and SocHx.    Patient Active Problem List   Diagnosis Date Noted   Chronic prescription benzodiazepine use 09/20/2021    Priority: High    Depression, major, single episode, moderate (HCC) 11/02/2020    Priority: High   Mixed hyperlipidemia 12/04/2017    Priority: High   Chronic cough 07/24/2017    Priority: High   Essential hypertension     Priority: High   Osteoarthritis of left glenohumeral joint 04/19/2020    Priority: Medium    OAB (overactive bladder) 10/28/2019    Priority: Medium    Chronic seasonal allergic rhinitis 12/04/2017    Priority: Medium    Chronic pansinusitis 09/09/2017    Priority: Medium    Laryngopharyngeal reflux (LPR) 09/09/2017    Priority: Medium    Bilateral leg edema 03/30/2014    Priority: Medium    History of gastritis 02/10/2014    Priority: Medium    Dysphagia 01/28/2014    Priority: Medium    Irritable bowel syndrome (IBS) 09/08/2013    Priority: Medium    Osteopenia 07/14/2013    Priority: Medium    Osteoarthritis, multiple sites 07/14/2013    Priority: Medium    Gastroesophageal reflux disease without esophagitis     Priority: Medium    Atrophic vaginitis 10/22/2018    Priority: Low   Status post total right knee replacement 01/24/2016    Priority: Low   Asthma 01/18/2022   Hemoptysis 01/18/2022   Current Meds  Medication Sig   acetaminophen  (TYLENOL ) 650 MG CR tablet Take 650 mg by mouth every 8 (eight) hours as needed for pain.   atorvastatin  (LIPITOR) 20 MG tablet TAKE 1 TABLET AT BEDTIME   azelastine  (ASTELIN ) 0.1 % nasal spray Place 1 spray into both nostrils 2 (two) times daily.   carvedilol  (COREG ) 25 MG tablet TAKE 1 TABLET TWICE A DAY   cetirizine  (ZYRTEC ) 10 MG tablet TAKE 1 TABLET DAILY   Cholecalciferol (VITAMIN D) 2000 UNITS CAPS Take 2,000 Units by mouth daily.    diclofenac  Sodium (VOLTAREN ) 1 % GEL Apply 2 g topically 4 (four) times daily as needed (arthritis pain, hands).   famotidine (PEPCID) 40 MG tablet Take 40 mg by mouth 2 (two) times daily.   fluticasone  (FLONASE ) 50 MCG/ACT nasal spray Place 2 sprays into both nostrils daily.   LINZESS  290  MCG CAPS capsule Take 290 mcg by mouth daily.   montelukast  (SINGULAIR ) 10 MG tablet TAKE 1 TABLET AT BEDTIME   Multiple Vitamin (MULTIVITAMIN) tablet Take 1 tablet by mouth daily.   pantoprazole  (PROTONIX ) 40 MG tablet Take 40 mg by mouth daily.   polyethylene glycol powder (GLYCOLAX /MIRALAX ) powder Take 17 g by mouth daily as needed.   Probiotic Product (PROBIOTIC PO) Take 1 capsule by mouth daily.    solifenacin (VESICARE) 5 MG tablet Take 5 mg by mouth daily.    Allergies: Patient is allergic to penicillins, sulfa antibiotics, sulfasalazine, tramadol, ciprofloxacin , trimethoprim, and isovue  [iopamidol ]. Family History: Patient family history includes Arthritis in her father and mother; Heart disease in her mother; Mental illness in her father; Osteoporosis in her mother; Other in her mother;  Stroke in her paternal grandfather; Sudden death in her father; Thyroid  disease in her father. Social History:  Patient  reports that she has never smoked. She has never used smokeless tobacco. She reports current alcohol use. She reports that she does not use drugs.  Review of Systems: Constitutional: Negative for fever malaise or anorexia Cardiovascular: negative for chest pain Respiratory: negative for SOB or persistent cough Gastrointestinal: negative for abdominal pain  Objective  Vitals: BP 127/85   Pulse 70   Temp (!) 97 F (36.1 C)   Ht 5' 3 (1.6 m)   Wt 193 lb (87.5 kg)   LMP 09/24/2012 (Approximate)   SpO2 97%   BMI 34.19 kg/m  General: no acute distress , A&Ox3 HEENT: PEERL, conjunctiva normal, neck is supple Cardiovascular:  RRR without murmur or gallop.  Respiratory:  Good breath sounds bilaterally, CTAB with normal respiratory effort Skin:  Warm, no rashes  Commons side effects, risks, benefits, and alternatives for medications and treatment plan prescribed today were discussed, and the patient expressed understanding of the given instructions. Patient is instructed to  call or message via MyChart if he/she has any questions or concerns regarding our treatment plan. No barriers to understanding were identified. We discussed Red Flag symptoms and signs in detail. Patient expressed understanding regarding what to do in case of urgent or emergency type symptoms.  Medication list was reconciled, printed and provided to the patient in AVS. Patient instructions and summary information was reviewed with the patient as documented in the AVS. This note was prepared with assistance of Dragon voice recognition software. Occasional wrong-word or sound-a-like substitutions may have occurred due to the inherent limitations of voice recognition software

## 2023-10-17 ENCOUNTER — Other Ambulatory Visit: Payer: Self-pay | Admitting: Family Medicine

## 2023-11-20 ENCOUNTER — Ambulatory Visit (INDEPENDENT_AMBULATORY_CARE_PROVIDER_SITE_OTHER): Payer: Medicare Other | Admitting: Family Medicine

## 2023-11-20 ENCOUNTER — Encounter: Payer: Self-pay | Admitting: Family Medicine

## 2023-11-20 VITALS — BP 149/85 | HR 65 | Temp 98.0°F | Ht 63.0 in | Wt 194.0 lb

## 2023-11-20 DIAGNOSIS — Z79899 Other long term (current) drug therapy: Secondary | ICD-10-CM

## 2023-11-20 DIAGNOSIS — F430 Acute stress reaction: Secondary | ICD-10-CM | POA: Diagnosis not present

## 2023-11-20 DIAGNOSIS — I10 Essential (primary) hypertension: Secondary | ICD-10-CM | POA: Diagnosis not present

## 2023-11-20 DIAGNOSIS — R053 Chronic cough: Secondary | ICD-10-CM

## 2023-11-20 MED ORDER — BUSPIRONE HCL 7.5 MG PO TABS: 7.5000 mg | ORAL_TABLET | Freq: Two times a day (BID) | ORAL | 3 refills | Status: AC | PRN

## 2023-11-20 NOTE — Progress Notes (Signed)
 Subjective  CC:  Chief Complaint  Patient presents with   Anxiety    Pt stated that she feels like she needs to get back on meds bc her anxiety has gone back up and she just lost a close friend    HPI: Erin West is a 73 y.o. female who presents to the office today to address the problems listed above in the chief complaint. History of Present Illness Erin West is a 73 year old female who presents with emotional distress related to a close friend's declining health.  She is experiencing significant emotional distress due to the declining health of a close friend, who is 39 years old and has been a significant support since the passing of her husband three and a half years ago. Her friend is currently in a skilled nursing facility due to a heart condition and recent pneumonia. She visits her friend daily and is deeply affected by her friend's condition and prognosis, which is uncertain but likely not long-term according to her friend's daughter.  She feels down and anxious, with difficulty sleeping and a reduced appetite. No panic attacks. She has previously used Buspar (buspirone) for anxiety related to her husband's passing, which she found helpful.  She has a persistent cough, which she attributes to chronic allergies. She has used a nebulizer in the past, prescribed by a pulmonary doctor, but is uncertain of its effectiveness. Her cough is not associated with wheezing, except during past episodes of bronchitis. She notes that her cough and eye irritation are exacerbated in certain environments, such as clothing stores.  HTN: has been well controlled. However elevated today but pt is quite emotional in office. Reviewed recent cards notes "bp good control on carvedilol"  Assessment  1. Stress reaction   2. Essential hypertension   3. Chronic prescription benzodiazepine use   4. Chronic cough      Plan  Stress/grief:  counseling done. Assessment and Plan Anticipatory  Grief Emotional distress due to impending loss of a close friend. Previously found Buspar helpful during bereavement period after husband's death. -Prescribe Buspar for as-needed use. 7.5mg  bid prn, consider counseling -Encourage seeking counseling or reading materials on end of life and loss.  Chronic Cough Discussed chronic nature again: multifactorial: allergies, lpr, bronchospasm Monitored by pulm  HTN Need more readings; suspect elevated today due to emotional state. Will monitor.    Follow up: as scheduled, cpe 12/26/2023  No orders of the defined types were placed in this encounter.  Meds ordered this encounter  Medications   busPIRone (BUSPAR) 7.5 MG tablet    Sig: Take 1 tablet (7.5 mg total) by mouth 2 (two) times daily as needed (anxiety).    Dispense:  60 tablet    Refill:  3      I reviewed the patients updated PMH, FH, and SocHx.    Patient Active Problem List   Diagnosis Date Noted   Chronic prescription benzodiazepine use 09/20/2021    Priority: High   Depression, major, single episode, moderate (HCC) 11/02/2020    Priority: High   Mixed hyperlipidemia 12/04/2017    Priority: High   Chronic cough 07/24/2017    Priority: High   Essential hypertension     Priority: High   Osteoarthritis of left glenohumeral joint 04/19/2020    Priority: Medium    OAB (overactive bladder) 10/28/2019    Priority: Medium    Chronic seasonal allergic rhinitis 12/04/2017    Priority: Medium    Chronic  pansinusitis 09/09/2017    Priority: Medium    Laryngopharyngeal reflux (LPR) 09/09/2017    Priority: Medium    Bilateral leg edema 03/30/2014    Priority: Medium    History of gastritis 02/10/2014    Priority: Medium    Dysphagia 01/28/2014    Priority: Medium    Irritable bowel syndrome (IBS) 09/08/2013    Priority: Medium    Osteopenia 07/14/2013    Priority: Medium    Osteoarthritis, multiple sites 07/14/2013    Priority: Medium    Gastroesophageal reflux disease  without esophagitis     Priority: Medium    Atrophic vaginitis 10/22/2018    Priority: Low   Status post total right knee replacement 01/24/2016    Priority: Low   Asthma 01/18/2022   Hemoptysis 01/18/2022   Current Meds  Medication Sig   acetaminophen (TYLENOL) 650 MG CR tablet Take 650 mg by mouth every 8 (eight) hours as needed for pain.   albuterol (VENTOLIN HFA) 108 (90 Base) MCG/ACT inhaler Inhale 2 puffs into the lungs every 4 (four) hours as needed for wheezing or shortness of breath.   atorvastatin (LIPITOR) 20 MG tablet TAKE 1 TABLET AT BEDTIME   azelastine (ASTELIN) 0.1 % nasal spray Place 1 spray into both nostrils 2 (two) times daily.   carvedilol (COREG) 25 MG tablet TAKE 1 TABLET TWICE A DAY   cetirizine (ZYRTEC) 10 MG tablet TAKE 1 TABLET DAILY   Cholecalciferol (VITAMIN D) 2000 UNITS CAPS Take 2,000 Units by mouth daily.    diclofenac Sodium (VOLTAREN) 1 % GEL Apply 2 g topically 4 (four) times daily as needed (arthritis pain, hands).   famotidine (PEPCID) 40 MG tablet Take 40 mg by mouth 2 (two) times daily.   fluticasone (FLONASE) 50 MCG/ACT nasal spray Place 2 sprays into both nostrils daily.   LINZESS 290 MCG CAPS capsule Take 290 mcg by mouth daily.   montelukast (SINGULAIR) 10 MG tablet TAKE 1 TABLET AT BEDTIME   Multiple Vitamin (MULTIVITAMIN) tablet Take 1 tablet by mouth daily.   pantoprazole (PROTONIX) 40 MG tablet Take 40 mg by mouth daily.   polyethylene glycol powder (GLYCOLAX/MIRALAX) powder Take 17 g by mouth daily as needed.   Probiotic Product (PROBIOTIC PO) Take 1 capsule by mouth daily.    solifenacin (VESICARE) 5 MG tablet Take 5 mg by mouth daily.    Allergies: Patient is allergic to penicillins, sulfa antibiotics, sulfasalazine, tramadol, ciprofloxacin, trimethoprim, and isovue [iopamidol]. Family History: Patient family history includes Arthritis in her father and mother; Heart disease in her mother; Mental illness in her father; Osteoporosis  in her mother; Other in her mother; Stroke in her paternal grandfather; Sudden death in her father; Thyroid disease in her father. Social History:  Patient  reports that she has never smoked. She has never used smokeless tobacco. She reports current alcohol use. She reports that she does not use drugs.  Review of Systems: Constitutional: Negative for fever malaise or anorexia Cardiovascular: negative for chest pain Respiratory: negative for SOB or persistent cough Gastrointestinal: negative for abdominal pain  Objective  Vitals: BP (!) 149/85 Comment: stressed today and crying in office; affecting bp  Pulse 65   Temp 98 F (36.7 C)   Ht 5\' 3"  (1.6 m)   Wt 194 lb (88 kg)   LMP 09/24/2012 (Approximate)   SpO2 98%   BMI 34.37 kg/m  General: A&Ox3 Psych: tearful, stressed, normal speech and good comprehension  Commons side effects, risks, benefits, and alternatives for  medications and treatment plan prescribed today were discussed, and the patient expressed understanding of the given instructions. Patient is instructed to call or message via MyChart if he/she has any questions or concerns regarding our treatment plan. No barriers to understanding were identified. We discussed Red Flag symptoms and signs in detail. Patient expressed understanding regarding what to do in case of urgent or emergency type symptoms.  Medication list was reconciled, printed and provided to the patient in AVS. Patient instructions and summary information was reviewed with the patient as documented in the AVS. This note was prepared with assistance of Dragon voice recognition software. Occasional wrong-word or sound-a-like substitutions may have occurred due to the inherent limitations of voice recognition software

## 2023-12-05 DIAGNOSIS — Z860100 Personal history of colon polyps, unspecified: Secondary | ICD-10-CM | POA: Diagnosis not present

## 2023-12-05 DIAGNOSIS — K219 Gastro-esophageal reflux disease without esophagitis: Secondary | ICD-10-CM | POA: Diagnosis not present

## 2023-12-05 DIAGNOSIS — K5904 Chronic idiopathic constipation: Secondary | ICD-10-CM | POA: Diagnosis not present

## 2023-12-06 ENCOUNTER — Other Ambulatory Visit: Payer: Self-pay | Admitting: Family Medicine

## 2023-12-06 DIAGNOSIS — R0982 Postnasal drip: Secondary | ICD-10-CM

## 2023-12-06 NOTE — Telephone Encounter (Signed)
 Copied from CRM [id number]. Topic: Clinical - Medication Refill >> Dec 06, 2023 12:03 PM Mackie Pai E wrote: Most Recent Primary Care Visit:  Provider: Willow Ora  Department: [facility]  Visit Type: ACUTE  Date: 11/20/2023  Medication: fluticasone (FLONASE) 50 MCG/ACT nasal spray  Has the patient contacted their pharmacy? Yes (Agent: If no, request that the patient contact the pharmacy for the refill. If patient does not wish to contact the pharmacy document the reason why and proceed with request.) (Agent: If yes, when and what did the pharmacy advise?)  Is this the correct pharmacy for this prescription? Yes If no, delete pharmacy and type the correct one.  This is the patient's preferred pharmacy:   [facility] - [place], [place] - [address] [place] [address] Phone: [phone] Fax: [phone]   Has the prescription been filled recently? No  Is the patient out of the medication? No, patient stated she has a couple days left.  Has the patient been seen for an appointment in the last year OR does the patient have an upcoming appointment? Yes  Can we respond through MyChart? Yes  Agent: Please be advised that Rx refills may take up to 3 business days. We ask that you follow-up with your pharmacy.

## 2023-12-06 NOTE — Telephone Encounter (Signed)
 Copied from CRM [id number]. Topic: Clinical - Medication Refill >> Dec 06, 2023  2:35 PM Alcus Dad wrote: Most Recent Primary Care Visit:  Provider: Willow Ora  Department: [facility]  Visit Type: ACUTE  Date: 11/20/2023  Medication: azelastine (ASTELIN) 0.1 % nasal spray fluticasone (FLONASE) 50 MCG/ACT nasal spray  Has the patient contacted their pharmacy? Yes (Agent: If no, request that the patient contact the pharmacy for the refill. If patient does not wish to contact the pharmacy document the reason why and proceed with request.) (Agent: If yes, when and what did the pharmacy advise?)  Is this the correct pharmacy for this prescription? Yes If no, delete pharmacy and type the correct one.  This is the patient's preferred pharmacy:  [id number] [place], [place] - [address] AT [facility] [address] [place] [address] Phone: [phone] Fax: [phone]  [facility] - [place], [place] - [address] [place] [address] Phone: [phone] Fax: [phone]  [id number] - [address], [place] - [address] [place] [address] Phone: [phone] Fax: [phone]   Has the prescription been filled recently? No  Is the patient out of the medication? No  Has the patient been seen for an appointment in the last year OR does the patient have an upcoming appointment? Yes  Can we respond through MyChart? Yes  Agent: Please be advised that Rx refills may take up to 3 business days. We ask that you follow-up with your pharmacy.

## 2023-12-06 NOTE — Telephone Encounter (Signed)
 Last Fill: Astelin: 10/07/23     Flonase: 12/19/21  Last OV: 11/20/23 Next OV: 12/26/23  Routing to provider for review/authorization.

## 2023-12-07 ENCOUNTER — Other Ambulatory Visit: Payer: Self-pay | Admitting: Family Medicine

## 2023-12-09 ENCOUNTER — Other Ambulatory Visit (HOSPITAL_COMMUNITY): Payer: Self-pay

## 2023-12-09 MED ORDER — LINZESS 290 MCG PO CAPS
290.0000 ug | ORAL_CAPSULE | Freq: Every day | ORAL | 3 refills | Status: AC
  Filled 2023-12-09: qty 30, 30d supply, fill #0

## 2023-12-09 MED ORDER — FLUTICASONE PROPIONATE 50 MCG/ACT NA SUSP: 2.0000 | Freq: Every day | NASAL | 2 refills | Status: DC

## 2023-12-09 MED ORDER — AZELASTINE HCL 0.1 % NA SOLN: 1.0000 | Freq: Two times a day (BID) | NASAL | 5 refills | Status: AC

## 2023-12-10 ENCOUNTER — Other Ambulatory Visit (HOSPITAL_COMMUNITY): Payer: Self-pay

## 2023-12-11 ENCOUNTER — Other Ambulatory Visit: Payer: Self-pay

## 2023-12-13 ENCOUNTER — Other Ambulatory Visit (HOSPITAL_COMMUNITY): Payer: Self-pay

## 2023-12-16 ENCOUNTER — Other Ambulatory Visit (HOSPITAL_COMMUNITY): Payer: Self-pay

## 2023-12-17 ENCOUNTER — Other Ambulatory Visit: Payer: Self-pay

## 2023-12-20 ENCOUNTER — Other Ambulatory Visit: Payer: Self-pay

## 2023-12-26 ENCOUNTER — Other Ambulatory Visit (HOSPITAL_COMMUNITY): Payer: Self-pay

## 2023-12-26 ENCOUNTER — Ambulatory Visit (INDEPENDENT_AMBULATORY_CARE_PROVIDER_SITE_OTHER): Payer: Medicare Other | Admitting: Family Medicine

## 2023-12-26 ENCOUNTER — Encounter: Payer: Self-pay | Admitting: Family Medicine

## 2023-12-26 VITALS — BP 124/72 | HR 60 | Temp 97.7°F | Ht 63.0 in | Wt 190.4 lb

## 2023-12-26 DIAGNOSIS — N3281 Overactive bladder: Secondary | ICD-10-CM | POA: Diagnosis not present

## 2023-12-26 DIAGNOSIS — R053 Chronic cough: Secondary | ICD-10-CM | POA: Diagnosis not present

## 2023-12-26 DIAGNOSIS — F321 Major depressive disorder, single episode, moderate: Secondary | ICD-10-CM

## 2023-12-26 DIAGNOSIS — R600 Localized edema: Secondary | ICD-10-CM | POA: Diagnosis not present

## 2023-12-26 DIAGNOSIS — Z79899 Other long term (current) drug therapy: Secondary | ICD-10-CM

## 2023-12-26 DIAGNOSIS — I10 Essential (primary) hypertension: Secondary | ICD-10-CM | POA: Diagnosis not present

## 2023-12-26 DIAGNOSIS — M8580 Other specified disorders of bone density and structure, unspecified site: Secondary | ICD-10-CM | POA: Diagnosis not present

## 2023-12-26 DIAGNOSIS — E782 Mixed hyperlipidemia: Secondary | ICD-10-CM

## 2023-12-26 LAB — LIPID PANEL
Cholesterol: 156 mg/dL (ref 0–200)
HDL: 52.1 mg/dL (ref >39.00)
LDL Cholesterol: 89 mg/dL (ref 0–99)
NonHDL: 104.2
Total CHOL/HDL Ratio: 3
Triglycerides: 74 mg/dL (ref 0.0–149.0)
VLDL: 14.8 mg/dL (ref 0.0–40.0)

## 2023-12-26 LAB — COMPREHENSIVE METABOLIC PANEL WITH GFR
ALT: 19 U/L (ref 0–35)
AST: 15 U/L (ref 0–37)
Albumin: 4.1 g/dL (ref 3.5–5.2)
Alkaline Phosphatase: 107 U/L (ref 39–117)
BUN: 20 mg/dL (ref 6–23)
CO2: 28 meq/L (ref 19–32)
Calcium: 9.2 mg/dL (ref 8.4–10.5)
Chloride: 105 meq/L (ref 96–112)
Creatinine, Ser: 0.97 mg/dL (ref 0.40–1.20)
GFR: 58.18 mL/min — ABNORMAL LOW (ref >60.00)
Glucose, Bld: 91 mg/dL (ref 70–99)
Potassium: 4.4 meq/L (ref 3.5–5.1)
Sodium: 138 meq/L (ref 135–145)
Total Bilirubin: 0.9 mg/dL (ref 0.2–1.2)
Total Protein: 7 g/dL (ref 6.0–8.3)

## 2023-12-26 LAB — CBC WITH DIFFERENTIAL/PLATELET
Basophils Absolute: 0.1 10*3/uL (ref 0.0–0.1)
Basophils Relative: 1 % (ref 0.0–3.0)
Eosinophils Absolute: 0.2 10*3/uL (ref 0.0–0.7)
Eosinophils Relative: 2.9 % (ref 0.0–5.0)
HCT: 41.3 % (ref 36.0–46.0)
Hemoglobin: 14 g/dL (ref 12.0–15.0)
Lymphocytes Relative: 26.7 % (ref 12.0–46.0)
Lymphs Abs: 1.5 10*3/uL (ref 0.7–4.0)
MCHC: 34 g/dL (ref 30.0–36.0)
MCV: 92.3 fl (ref 78.0–100.0)
Monocytes Absolute: 0.4 10*3/uL (ref 0.1–1.0)
Monocytes Relative: 7.2 % (ref 3.0–12.0)
Neutro Abs: 3.4 10*3/uL (ref 1.4–7.7)
Neutrophils Relative %: 62.2 % (ref 43.0–77.0)
Platelets: 241 10*3/uL (ref 150.0–400.0)
RBC: 4.47 Mil/uL (ref 3.87–5.11)
RDW: 14.1 % (ref 11.5–15.5)
WBC: 5.5 10*3/uL (ref 4.0–10.5)

## 2023-12-26 LAB — TSH: TSH: 2.42 u[IU]/mL (ref 0.35–5.50)

## 2023-12-26 NOTE — Patient Instructions (Signed)
 Please return in 6 months for hypertension follow up.   I will release your lab results to you on your MyChart account with further instructions. You may see the results before I do, but when I review them I will send you a message with my report or have my assistant call you if things need to be discussed. Please reply to my message with any questions. Thank you!   If you have any questions or concerns, please don't hesitate to send me a message via MyChart or call the office at [phone]. Thank you for visiting with [place] today! It's our pleasure caring for you.   VISIT SUMMARY:  Today, you came in for a routine follow-up visit. We discussed your blood pressure, chronic cough, anxiety, and toenail issues. We also reviewed your general health maintenance and scheduled necessary follow-ups.  YOUR PLAN:  -HYPERTENSION: Your blood pressure readings at home are well-controlled, but they tend to be higher during office visits. This is a common occurrence. Continue monitoring your blood pressure at home and bring your home monitor to the next visit for comparison.  -CHRONIC COUGH: Your persistent cough is likely due to acid reflux, which can be worsened by caffeine intake. Try reducing your caffeine intake and consider switching to decaffeinated or herbal teas.  -ANXIETY: You mentioned that Buspirone did not significantly help with your anxiety. Anxiety can sometimes worsen during periods of grief. You can take Buspirone during times of increased anxiety, up to two tablets as needed.  -INGROWN TOENAIL: You have an ingrown toenail and another nail growing incorrectly, possibly due to changes in your walking pattern before your knee replacement. Visit the podiatrist at [facility] for evaluation and potential treatment.  -GENERAL HEALTH MAINTENANCE: Your routine health maintenance is up to date. You have a mammogram scheduled and need to get blood work done today since you are fasting. Continue with  regular eye exams, healthy eating, and good sleep.  INSTRUCTIONS:  Please schedule a follow-up appointment in six months. Bring your home blood pressure monitor to the next visit for comparison. Perform blood work today as you are fasting, and attend your scheduled mammogram appointment.

## 2023-12-26 NOTE — Progress Notes (Signed)
 Subjective  Chief Complaint  Patient presents with   Annual Exam    Pt here for Annual Exam and is currently fasting    Hypertension    HPI: Erin West is a 73 y.o. female who presents to [facility] [place] today for a Female Wellness Visit. She also has the concerns and/or needs as listed above in the chief complaint. These will be addressed in addition to the Health Maintenance Visit.   Wellness Visit: annual visit with health maintenance review and exam  HM: has mammo scheduled for next week, [facility]. Dexa is current. CRC screen is current. Doing better today.  Chronic disease f/u and/or acute problem visit: (deemed necessary to be done in addition to the wellness visit): Depression: improved. Anxiety is improved. Her elderly friend passed away: it was hard but now she is somewhat relieved. She tried the buspar with minimal results. No adverse effects. HTN: reports regular home readings are always normal. Endorses white coat HTN. No cp or sob. Compliant with carvedilol bid.  Due for lipid recheck.  On vesicare with improvement in OAB sxs Chronic cough persists due to lpr and AR; on meds Edema is stable.   Assessment  1. Essential hypertension   2. Depression, major, single episode, moderate (HCC)   3. Mixed hyperlipidemia   4. OAB (overactive bladder)   5. Osteopenia, unspecified location   6. Chronic prescription benzodiazepine use   7. Chronic cough   8. Bilateral leg edema      Plan  Female Wellness Visit: Age appropriate Health Maintenance and Prevention measures were discussed with patient. Included topics are cancer screening recommendations, ways to keep healthy (see AVS) including dietary and exercise recommendations, regular eye and dental care, use of seat belts, and avoidance of moderate alcohol use and tobacco use. Screens scheduled. BMI: discussed patient's BMI and encouraged positive lifestyle modifications to help get to or maintain a  target BMI. HM needs and immunizations were addressed and ordered. See below for orders. See HM and immunization section for updates. Routine labs and screening tests ordered including cmp, cbc and lipids where appropriate. Discussed recommendations regarding Vit D and calcium supplementation (see AVS)  Chronic disease management visit and/or acute problem visit: Hypertension: Well-controlled by home readings.  Recommend her bring in her cuff for her next visit so we can compare against ours.  Continue carvedilol twice daily.  Check renal function electrolytes. Hyperlipidemia for recheck today.  She is fasting.  Monitoring LFTs.  Well-tolerated Allergies and chronic GERD with chronic cough.  Difficult times due to pollen season.  Continue medications. Mood: Improved.  Less stressful life at the moment.  Can retry BuSpar if needed.  Declines SSRIs.  No indication at this time. Osteopenia: Last bone density last year.  Due again next year.  Stable.  Continue weight bearing exercise, calcium and vitamin D. Continue Vesicare for overactive bladder. Follow up: 6 months for recheck Orders Placed This Encounter  Procedures   CBC with Differential/Platelet   Comprehensive metabolic panel with GFR   Lipid panel   TSH   No orders of the defined types were placed in this encounter.     Body mass index is 33.73 kg/m. Wt Readings from Last 3 Encounters:  12/26/23 190 lb 6.4 oz (86.4 kg)  11/20/23 194 lb (88 kg)  10/07/23 193 lb (87.5 kg)     Patient Active Problem List   Diagnosis Date Noted Date Diagnosed   Chronic prescription benzodiazepine use 09/20/2021  Priority: High    Sleep and mood: grief/anxiety due to loss of husband.husband passed in 2021 Declines SSRI  Education given    Depression, major, single episode, moderate (HCC) 11/02/2020     Priority: High   Mixed hyperlipidemia 12/04/2017     Priority: High   Chronic cough 07/24/2017     Priority: High    Persistent  cough.   Has seen ENT, pulmonology, gastroenterology without any clear diagnosis.  She reports worsening of cough in the morning with a.m. hoarseness and drainage.  She does have allergies.  She has seen an allergist many many years ago.  On Flonase and Zyrtec nightly.  CT scan of the sinuses was negative for infection.  PFTs were normal.  Chest x-ray was normal.  She is on chronic high-dose PPI and has been evaluated for esophageal dysmotility by Dr. Loreta Ave.  They have not found a cause for her persistent cough.  She is on losartan Pulmonary consult January 2019, PFT normal No change off ARB (losartan).      Essential hypertension      Priority: High    Overview:  Normal Stress Test 2012 by [facility]; ECHO EF 65% with mild diastolic dysfunction 04/2014 Carvedilol 25 bid     Osteoarthritis of left glenohumeral joint 04/19/2020     Priority: Medium    OAB (overactive bladder) 10/28/2019     Priority: Medium    Chronic seasonal allergic rhinitis 12/04/2017     Priority: Medium    Chronic pansinusitis 09/09/2017     Priority: Medium    Laryngopharyngeal reflux (LPR) 09/09/2017     Priority: Medium    Bilateral leg edema 03/30/2014     Priority: Medium     Overview:  Normal labs; ECHO with EF 65% and mild diastolic dysfunction; doppler pending on left.  Lasix 40 bid to manage in high humidity. Does fine during winter. TED hose    History of gastritis 02/10/2014     Priority: Medium     Overview:  01/2014 dr. Loreta Ave    Dysphagia 01/28/2014     Priority: Medium     Overview:  Dr. Loreta Ave - esophagus dysmobility and reflux on barium swallow.  Last Assessment & Plan:  Patient is reporting some dysphasia and acid reflux. I recommended she increased her omeprazole to 20 mg twice daily and schedule follow-up with GI    Irritable bowel syndrome (IBS) 09/08/2013     Priority: Medium     Overview:  evaluation Dr. Loreta Ave 2015 upper endoscopy with diffuse gastritis. Stopped nsaids.    Osteopenia  07/14/2013     Priority: Medium     DEXA 12/2022: Stable osteopenia, T equals -2.1 lumbar spine, femur scores are stable: Recheck 2 to 3 years. DEXA 12/2020: stable osteopenia, T = -1.6 lowest femur; receck 2-3 years DEXA 11/2018: stable osteopenia, T = -1.5 lowest femer; recheck 2-3 years. DEXA 2014, mild osteopenia, T = -1.56 lowest (almost normal); on calcium and vit D; + Fh of osteoporosis    Osteoarthritis, multiple sites 07/14/2013     Priority: Medium     Overview:  Followed by [facility]; MRI done in past - DJD and spurs. S/p knee replacement. Dr. Darnell Level  Hand injection:  Injections 2018, Dr. Jinny Blossom    Gastroesophageal reflux disease without esophagitis      Priority: Medium    Atrophic vaginitis 10/22/2018     Priority: Low   Status post total right knee replacement 01/24/2016     Priority:  Low   Asthma 01/18/2022    Hemoptysis 01/18/2022    Health Maintenance  Topic Date Due   MAMMOGRAM  12/24/2023   COVID-19 Vaccine (9 - Pfizer risk 2024-25 season) 03/05/2024   Medicare Annual Wellness (AWV)  03/18/2024   INFLUENZA VACCINE  04/24/2024   DTaP/Tdap/Td (3 - Td or Tdap) 12/18/2025   DEXA SCAN  01/10/2026   Colonoscopy  02/08/2032   Pneumonia Vaccine 41+ Years old  Completed   Hepatitis C Screening  Completed   Zoster Vaccines- Shingrix  Completed   HPV VACCINES  Aged Out   Immunization History  Administered Date(s) Administered   Fluad Quad(high Dose 65+) 07/15/2021, 06/07/2022   Fluad Trivalent(High Dose 65+) 09/05/2023   Influenza Split 06/24/2013   Influenza, High Dose Seasonal PF 05/23/2016, 06/28/2017, 06/12/2018, 05/17/2019   Influenza, Seasonal, Injecte, Preservative Fre 05/27/2014, 07/11/2015   Influenza,inj,quad, With Preservative 06/25/2019   PFIZER(Purple Top)SARS-COV-2 Vaccination 10/30/2019, 11/24/2019, 06/22/2020, 12/28/2020   Pfizer Covid-19 Vaccine Bivalent Booster 4yrs & up 07/15/2021   Pfizer(Comirnaty)Fall Seasonal Vaccine 12 years and older  01/02/2023, 09/05/2023   Pneumococcal Conjugate-13 10/29/2016   Pneumococcal Polysaccharide-23 07/25/2012, 11/06/2017, 05/17/2019   Tdap 09/24/2010, 12/19/2015   Zoster Recombinant(Shingrix) 08/09/2017, 11/06/2017   Zoster, Live 09/25/2011   We updated and reviewed the patient's past history in detail and it is documented below. Allergies: Patient is allergic to penicillins, sulfa antibiotics, sulfasalazine, tramadol, ciprofloxacin, trimethoprim, and isovue [iopamidol]. Past Medical History Patient  has a past medical history of Anxiety, Arthritis, Aspiration pneumonia (HCC) (2013), Atypical chest pain, Diarrhea, Diastolic dysfunction, DVT (deep venous thrombosis) (HCC), Dyslipidemia, GERD (gastroesophageal reflux disease), Hypertension, Osteoarthritis, Osteopenia, Palpitations, and Syncope. Past Surgical History Patient  has a past surgical history that includes Cholecystectomy (2006); Tubal ligation (06/1978); Replacement total knee (Left); Knee arthroscopy (Right); and Replacement total knee (Right). Family History: Patient family history includes Arthritis in her father and mother; Heart disease in her mother; Mental illness in her father; Osteoporosis in her mother; Other in her mother; Stroke in her paternal grandfather; Sudden death in her father; Thyroid disease in her father. Social History:  Patient  reports that she has never smoked. She has never used smokeless tobacco. She reports current alcohol use. She reports that she does not use drugs.  Review of Systems: Constitutional: negative for fever or malaise Ophthalmic: negative for photophobia, double vision or loss of vision Cardiovascular: negative for chest pain, dyspnea on exertion, or new LE swelling Respiratory: negative for SOB or persistent cough Gastrointestinal: negative for abdominal pain, change in bowel habits or melena Genitourinary: negative for dysuria or gross hematuria, no abnormal uterine bleeding or  disharge Musculoskeletal: negative for new gait disturbance or muscular weakness Integumentary: negative for new or persistent rashes, no breast lumps Neurological: negative for TIA or stroke symptoms Psychiatric: negative for SI or delusions Allergic/Immunologic: negative for hives  Patient Care Team    Relationship Specialty Notifications Start End  Willow Ora, MD PCP - General Family Medicine  03/19/16   Lewayne Bunting, MD PCP - Cardiology Cardiology  04/13/21   Elige Ko., MD Consulting Physician Orthopedic Surgery  12/04/17   Cindee Salt, MD Consulting Physician Orthopedic Surgery  12/04/17   Charna Elizabeth, MD Consulting Physician Gastroenterology  12/04/17   Merwyn Katos, DPM Consulting Physician Podiatry  10/22/18   Lewayne Bunting, MD Consulting Physician Cardiology  10/22/18   Ilda Mori, NP  Nurse Practitioner  10/22/18   Alfredo Martinez, MD Consulting Physician Urology  10/28/19  Caryl Pina, PA-C Consulting Physician Dermatology  10/29/19   Marcelino Duster, PA Physician Assistant Cardiology  04/02/22     Objective  Vitals: BP 124/72 Comment: by consistent home readings  Pulse 60   Temp 97.7 F (36.5 C)   Ht 5\' 3"  (1.6 m)   Wt 190 lb 6.4 oz (86.4 kg)   LMP 09/24/2012 (Approximate)   SpO2 98%   BMI 33.73 kg/m  General:  Well developed, well nourished, no acute distress  Psych:  Alert and orientedx3, brighter mood and affect today HEENT:  Normocephalic, atraumatic, non-icteric sclera,  supple neck without adenopathy, mass or thyromegaly Cardiovascular:  Normal S1, S2, RRR without gallop, rub or murmur Respiratory:  Good breath sounds bilaterally, CTAB with normal respiratory effort Gastrointestinal: normal bowel sounds, soft, non-tender, no noted masses. No HSM MSK: extremities without edema, joints without erythema or swelling Neurologic:    Mental status is normal.  Gross motor and sensory exams are normal.  No tremor  Commons side  effects, risks, benefits, and alternatives for medications and treatment plan prescribed today were discussed, and the patient expressed understanding of the given instructions. Patient is instructed to call or message via MyChart if he/she has any questions or concerns regarding our treatment plan. No barriers to understanding were identified. We discussed Red Flag symptoms and signs in detail. Patient expressed understanding regarding what to do in case of urgent or emergency type symptoms.  Medication list was reconciled, printed and provided to the patient in AVS. Patient instructions and summary information was reviewed with the patient as documented in the AVS. This note was prepared with assistance of Dragon voice recognition software. Occasional wrong-word or sound-a-like substitutions may have occurred due to the inherent limitations of voice recognition software

## 2023-12-28 ENCOUNTER — Encounter: Payer: Self-pay | Admitting: Family Medicine

## 2023-12-28 NOTE — Progress Notes (Signed)
 See mychart note Dear Ms. Wiley, Your lab results all look good!  Sincerely, Dr. Mardelle Matte

## 2023-12-30 DIAGNOSIS — Z1231 Encounter for screening mammogram for malignant neoplasm of breast: Secondary | ICD-10-CM | POA: Diagnosis not present

## 2023-12-30 DIAGNOSIS — A059 Bacterial foodborne intoxication, unspecified: Secondary | ICD-10-CM | POA: Diagnosis not present

## 2023-12-30 DIAGNOSIS — R1084 Generalized abdominal pain: Secondary | ICD-10-CM | POA: Diagnosis not present

## 2023-12-30 LAB — HM MAMMOGRAPHY

## 2023-12-31 ENCOUNTER — Encounter: Payer: Self-pay | Admitting: Family Medicine

## 2024-01-02 ENCOUNTER — Encounter: Payer: Medicare Other | Admitting: Family Medicine

## 2024-01-13 DIAGNOSIS — M25552 Pain in left hip: Secondary | ICD-10-CM | POA: Diagnosis not present

## 2024-01-13 DIAGNOSIS — M180 Bilateral primary osteoarthritis of first carpometacarpal joints: Secondary | ICD-10-CM | POA: Diagnosis not present

## 2024-01-17 ENCOUNTER — Telehealth: Payer: Self-pay

## 2024-01-17 NOTE — Telephone Encounter (Signed)
 Copied from CRM [id number]. Topic: Clinical - Lab/Test Results >> Jan 17, 2024 10:36 AM Chuck Crater wrote: Reason for CRM: Patient wants her results mailed if possible.

## 2024-01-22 ENCOUNTER — Other Ambulatory Visit: Payer: Self-pay | Admitting: Family Medicine

## 2024-01-29 ENCOUNTER — Ambulatory Visit: Admitting: Podiatry

## 2024-01-31 DIAGNOSIS — H04123 Dry eye syndrome of bilateral lacrimal glands: Secondary | ICD-10-CM | POA: Diagnosis not present

## 2024-02-07 ENCOUNTER — Encounter: Payer: Self-pay | Admitting: Podiatry

## 2024-02-07 ENCOUNTER — Ambulatory Visit: Admitting: Podiatry

## 2024-02-07 DIAGNOSIS — M79672 Pain in left foot: Secondary | ICD-10-CM | POA: Diagnosis not present

## 2024-02-07 DIAGNOSIS — B351 Tinea unguium: Secondary | ICD-10-CM | POA: Diagnosis not present

## 2024-02-07 DIAGNOSIS — M79671 Pain in right foot: Secondary | ICD-10-CM

## 2024-02-07 MED ORDER — TAVABOROLE 5 % EX SOLN: 5.0000 | Freq: Two times a day (BID) | CUTANEOUS | 6 refills | Status: AC

## 2024-02-07 NOTE — Progress Notes (Signed)
 Patient presents for evaluation and treatment of tenderness and some redness around nails feet.  Tenderness around toes with walking and wearing shoes.  Physical exam:  General appearance: Alert, pleasant, and in no acute distress.  Vascular: Pedal pulses: DP palpable bilaterally, PT palpable bilaterally.  Normal edema lower legs bilaterally  Neurological:  No paresthesias or burning noted in feet  Dermatologic:  Nails thickened, disfigured, discolored 1-5 BL with subungual debris.  Redness and hypertrophic nail folds along nail folds bilaterally but no signs of drainage or infection.  Musculoskeletal:  No deformities 2 through 5 bilaterally   Diagnosis: 1.  Painful onychomycotic nails 1 through 5 bilaterally. 2. Pain toes 1 through 5 bilaterally.  Plan: -Debrided onychomycotic nails 1 through 5 bilaterally. -Rx Kerydin topical, apply to nails daily  Return 3 months

## 2024-02-18 ENCOUNTER — Telehealth: Payer: Self-pay

## 2024-02-18 ENCOUNTER — Telehealth: Payer: Self-pay | Admitting: Podiatry

## 2024-02-18 MED ORDER — TAVABOROLE 5 % EX SOLN: 5.0000 mL | Freq: Two times a day (BID) | CUTANEOUS | 6 refills | Status: AC

## 2024-02-18 NOTE — Telephone Encounter (Signed)
 [facility] is trying to contact Dr. Meriam Stamp since 5/20. They have questions about the script before it can be filled. Appt date was 5/16 . Patient provided this number for [facility] [phone]

## 2024-02-18 NOTE — Telephone Encounter (Signed)
 PA request received from [facility] for Tavaborole  5% solution. PA submitted thorugh covermymeds and waitiing on response.  Erin West  ([employer]) Rx #: [id number]

## 2024-02-20 NOTE — Telephone Encounter (Signed)
 Tavaborole  PA was denied due to there being no confirmation of onychomycosis by fungal culture, nail biopsy, KOH preparation or other assessment

## 2024-02-20 NOTE — Telephone Encounter (Signed)
 Pt calling back to see what is the update on a new medication being called in to replace the previous that was not approved by insurance. Please contact Pt with update on what the Dr has decided to prescribe. TY

## 2024-02-21 MED ORDER — CICLOPIROX 8 % EX SOLN: Freq: Every day | CUTANEOUS | 0 refills | Status: DC

## 2024-03-04 DIAGNOSIS — M180 Bilateral primary osteoarthritis of first carpometacarpal joints: Secondary | ICD-10-CM | POA: Diagnosis not present

## 2024-03-10 ENCOUNTER — Encounter: Payer: Self-pay | Admitting: Family Medicine

## 2024-03-10 ENCOUNTER — Ambulatory Visit (INDEPENDENT_AMBULATORY_CARE_PROVIDER_SITE_OTHER): Admitting: Family Medicine

## 2024-03-10 VITALS — BP 130/90 | HR 73 | Temp 97.5°F | Resp 18 | Ht 63.0 in | Wt 195.4 lb

## 2024-03-10 DIAGNOSIS — R350 Frequency of micturition: Secondary | ICD-10-CM

## 2024-03-10 LAB — POC URINALSYSI DIPSTICK (AUTOMATED)
Bilirubin, UA: NEGATIVE
Blood, UA: NEGATIVE
Glucose, UA: NEGATIVE
Ketones, UA: NEGATIVE
Nitrite, UA: NEGATIVE
Protein, UA: NEGATIVE
Spec Grav, UA: 1.02 (ref 1.010–1.025)
Urobilinogen, UA: 0.2 U/dL
pH, UA: 5.5 (ref 5.0–8.0)

## 2024-03-10 MED ORDER — CETIRIZINE HCL 10 MG PO TABS: 10.0000 mg | ORAL_TABLET | Freq: Every day | ORAL | 3 refills | Status: AC

## 2024-03-10 MED ORDER — NITROFURANTOIN MONOHYD MACRO 100 MG PO CAPS: 100.0000 mg | ORAL_CAPSULE | Freq: Two times a day (BID) | ORAL | 0 refills | Status: DC

## 2024-03-10 NOTE — Progress Notes (Signed)
 Subjective:     Patient ID: Erin West, female    DOB: [birth year], 74 y.o.   MRN: [id number]  Chief Complaint  Patient presents with   Urinary Frequency    Urinary frequency and urgency that started Thursday   Abdominal Cramping    Sx started Thursday    HPI Discussed the use of AI scribe software for clinical note transcription with the patient, who gave verbal consent to proceed.  History of Present Illness Erin West is a 73 year old female with overactive bladder who presents with new onset urinary frequency.  Urinary frequency began five days ago, characterized by the sensation of incomplete bladder emptying, leading to urination two to three times after initially voiding in the morning. This is a new symptom for her, distinct from previous urinary tract infections, which were marked by urgency and minimal output. No urgency or burning sensation during urination, and no associated fever, chills, or nausea. No increased nocturia, as she is typically awakened by her dogs rather than bladder urgency.  She experiences some cramping in the lower abdomen. Despite a history of UTIs, she describes this episode as different from previous ones. She is currently taking Vesicare for overactive bladder and has not missed any doses. Sees urologist end of month  She has a known allergy to penicillin, which causes swelling and rash, and has experienced muscle pain from Cipro  and rash and swelling from sulfa medications.     Health Maintenance Due  Topic Date Due   Medicare Annual Wellness (AWV)  03/18/2024    Past Medical History:  Diagnosis Date   Anxiety    Arthritis    Aspiration pneumonia (HCC) 2013   Atypical chest pain    Stress test 04/21/10 - post-stress EF=89%. Normal scan.   Diarrhea    Diastolic dysfunction    But normal LV function on ECHO 2011 and low risk Myoview in 2011   DVT (deep venous thrombosis) (HCC)    Dyslipidemia    GERD (gastroesophageal reflux  disease)    Hypertension    Osteoarthritis    Osteopenia    Palpitations    [employer] MCT Monitor 04/16/10-04/22/10    Syncope    Pain-mediated syncope    Past Surgical History:  Procedure Laterality Date   CHOLECYSTECTOMY  2006   KNEE ARTHROSCOPY Right    REPLACEMENT TOTAL KNEE Left    left   REPLACEMENT TOTAL KNEE Right    TUBAL LIGATION  06/1978     Current Outpatient Medications:    acetaminophen  (TYLENOL ) 650 MG CR tablet, Take 650 mg by mouth every 8 (eight) hours as needed for pain., Disp: , Rfl:    albuterol  (VENTOLIN  HFA) 108 (90 Base) MCG/ACT inhaler, Inhale 2 puffs into the lungs every 4 (four) hours as needed for wheezing or shortness of breath., Disp: 1 each, Rfl: 2   atorvastatin  (LIPITOR) 20 MG tablet, TAKE 1 TABLET AT BEDTIME, Disp: 90 tablet, Rfl: 3   azelastine  (ASTELIN ) 0.1 % nasal spray, Place 1 spray into both nostrils 2 (two) times daily., Disp: 30 mL, Rfl: 5   busPIRone  (BUSPAR ) 7.5 MG tablet, Take 1 tablet (7.5 mg total) by mouth 2 (two) times daily as needed (anxiety)., Disp: 60 tablet, Rfl: 3   carvedilol  (COREG ) 25 MG tablet, TAKE 1 TABLET TWICE A DAY, Disp: 30 tablet, Rfl: 0   Cholecalciferol (VITAMIN D) 2000 UNITS CAPS, Take 2,000 Units by mouth daily. , Disp: , Rfl:    ciclopirox  (PENLAC )  8 % solution, Apply topically at bedtime. Apply over nail and surrounding skin. Apply daily over previous coat. After seven (7) days, may remove with alcohol and continue cycle., Disp: 6.6 mL, Rfl: 0   diclofenac  Sodium (VOLTAREN ) 1 % GEL, Apply 2 g topically 4 (four) times daily as needed (arthritis pain, hands)., Disp: 300 g, Rfl: 3   famotidine (PEPCID) 40 MG tablet, Take 40 mg by mouth 2 (two) times daily., Disp: , Rfl:    fluticasone  (FLONASE ) 50 MCG/ACT nasal spray, Place 2 sprays into both nostrils daily., Disp: 16 g, Rfl: 2   LINZESS  290 MCG CAPS capsule, Take 1 capsule (290 mcg total) by mouth daily., Disp: 30 capsule, Rfl: 3   montelukast  (SINGULAIR ) 10 MG  tablet, TAKE 1 TABLET AT BEDTIME, Disp: 90 tablet, Rfl: 3   Multiple Vitamin (MULTIVITAMIN) tablet, Take 1 tablet by mouth daily., Disp: , Rfl:    nitrofurantoin , macrocrystal-monohydrate, (MACROBID ) 100 MG capsule, Take 1 capsule (100 mg total) by mouth 2 (two) times daily., Disp: 14 capsule, Rfl: 0   pantoprazole  (PROTONIX ) 40 MG tablet, Take 40 mg by mouth daily., Disp: , Rfl:    polyethylene glycol powder (GLYCOLAX /MIRALAX ) powder, Take 17 g by mouth daily as needed., Disp: 3350 g, Rfl: 1   Probiotic Product (PROBIOTIC PO), Take 1 capsule by mouth daily. , Disp: , Rfl:    solifenacin (VESICARE) 5 MG tablet, Take 5 mg by mouth daily., Disp: , Rfl:    Tavaborole  5 % SOLN, Apply 5 mLs topically 2 (two) times daily., Disp: 10 mL, Rfl: 6   cetirizine  (ZYRTEC ) 10 MG tablet, Take 1 tablet (10 mg total) by mouth daily., Disp: 90 tablet, Rfl: 3  Allergies  Allergen Reactions   Penicillins Swelling and Rash    Has patient had a PCN reaction causing immediate rash, facial/tongue/throat swelling, SOB or lightheadedness with hypotension: No Has patient had a PCN reaction causing severe rash involving mucus membranes or skin necrosis: No Has patient had a PCN reaction that required hospitalization unknown Has patient had a PCN reaction occurring within the last 10 years: No If all of the above answers are NO, then may proceed with Cephalosporin use.    Sulfa Antibiotics Rash, Swelling and Other (See Comments)   Sulfasalazine Rash and Swelling   Tramadol Other (See Comments)    Rapid heart rate   Ciprofloxacin      Muscular pain   Trimethoprim     rash   Isovue  [Iopamidol ] Hives    Pt had sneezing, itchy throat, a couple of hives and swollen, itchy left eye. Pt was given 50 mg po benadryl , and water.  Dr Fulton Job checked pt.  We observed pt for 30 mins w/ 5 minute BP checks.  Pt left w/o complication.  Pt will need full premeds in the future.  J Bohm, RTRCT   ROS neg/noncontributory except as noted  HPI/below      Objective:     BP (!) 130/90 (BP Location: Left Arm, Patient Position: Sitting, Cuff Size: Large)   Pulse 73   Temp (!) 97.5 F (36.4 C) (Temporal)   Resp 18   Ht 5' 3 (1.6 m)   Wt 195 lb 6 oz (88.6 kg)   LMP 09/24/2012 (Approximate)   SpO2 96%   BMI 34.61 kg/m  Wt Readings from Last 3 Encounters:  03/10/24 195 lb 6 oz (88.6 kg)  12/26/23 190 lb 6.4 oz (86.4 kg)  11/20/23 194 lb (88 kg)    Physical Exam  Gen: WDWN NAD HEENT: NCAT, conjunctiva not injected, sclera nonicteric CARDIAC: RRR, S1S2+, no murmur.  ABDOMEN:  BS+, soft, some mild tenderness suprapubically, No HSM, no masses.  No CVAT EXT:  no edema MSK: no gross abnormalities.  NEURO: A&O x3.  CN II-XII intact.  PSYCH: normal mood. Good eye contact  Results for orders placed or performed in visit on 03/10/24  POCT Urinalysis Dipstick (Automated)   Collection Time: 03/10/24  3:05 PM  Result Value Ref Range   Color, UA YELLOW    Clarity, UA CLEAR    Glucose, UA Negative Negative   Bilirubin, UA NEG    Ketones, UA NEG    Spec Grav, UA 1.020 1.010 - 1.025   Blood, UA NEG    pH, UA 5.5 5.0 - 8.0   Protein, UA Negative Negative   Urobilinogen, UA 0.2 0.2 or 1.0 E.U./dL   Nitrite, UA NEG    Leukocytes, UA Small (1+) (A) Negative       Assessment & Plan:  Frequent urination -     POCT Urinalysis Dipstick (Automated) -     Urine Culture  Other orders -     Cetirizine  HCl; Take 1 tablet (10 mg total) by mouth daily.  Dispense: 90 tablet; Refill: 3 -     Nitrofurantoin  Monohyd Macro; Take 1 capsule (100 mg total) by mouth 2 (two) times daily.  Dispense: 14 capsule; Refill: 0  Assessment and Plan Assessment & Plan Urinary Frequency   Urinary frequency began five days ago with a sensation of incomplete bladder emptying and mild suprapubic discomfort. There is no nocturia, urgency, dysuria, fever, chills, or nausea. The differential includes a urinary tract infection (UTI) due to the presence  of white blood cells in urine, though the count is not high. Current symptoms differ from past UTIs, which included urgency and minimal output. Macrobid  is prescribed as the safest option given her allergy profile, despite being slower acting. Urine culture results are awaited in two days to confirm the diagnosis and adjust treatment if necessary.  Overactive Bladder   Overactive bladder is managed with Vesicare, as prescribed by her urologist. She will continue Vesicare and has a follow-up with the urologist at the end of the month.  Medication Allergies   She has allergic reactions to penicillin, sulfa drugs, and Cipro , with symptoms including swelling, rash, and muscle pain. There is concern about potential cross-reactivity with cephalosporins like Keflex due to a history of mouth swelling. Macrobid  is selected for her current urinary symptoms due to her allergy profile. Penicillin, sulfa drugs, and Cipro  are to be avoided.    Return if symptoms worsen or fail to improve.  Ellsworth Haas, MD

## 2024-03-10 NOTE — Patient Instructions (Signed)
 Meds sent.  Drink plenty of water

## 2024-03-11 LAB — URINE CULTURE
MICRO NUMBER:: 16590446
Result:: NO GROWTH
SPECIMEN QUALITY:: ADEQUATE

## 2024-03-12 ENCOUNTER — Ambulatory Visit: Payer: Self-pay | Admitting: Family Medicine

## 2024-03-12 NOTE — Progress Notes (Signed)
 Culture is negative.  If not improving, stop the antibiotics.  If improving, continue.

## 2024-03-19 DIAGNOSIS — R350 Frequency of micturition: Secondary | ICD-10-CM | POA: Diagnosis not present

## 2024-03-19 DIAGNOSIS — N3941 Urge incontinence: Secondary | ICD-10-CM | POA: Diagnosis not present

## 2024-03-24 ENCOUNTER — Other Ambulatory Visit: Payer: Self-pay | Admitting: Cardiology

## 2024-03-30 ENCOUNTER — Ambulatory Visit: Payer: Medicare Other

## 2024-04-03 ENCOUNTER — Telehealth: Payer: Self-pay

## 2024-04-03 NOTE — Telephone Encounter (Signed)
 Copied from CRM [id number]. Topic: General - Phone/Fax/Address >> Apr 01, 2024  1:57 PM Zenovia PARAS wrote: Patient/patient representative is calling for clinic's phone, fax, or address information. Ryan from [employer] calling to see if fax was received >> Apr 02, 2024 10:52 AM Rosina BIRCH wrote: Bernardino from [employer] called wanting to see if the office received a prior authorization fax for patient. It was faxed on 7/8 and 7/9. Bernardino stated that they will fax it again and it will take three minutes to get it CB [phone]  Spoke with pt and she stated that she does not know anything about someone contacting us  regarding her knee and said that it was ok to disregard the paper.

## 2024-04-09 ENCOUNTER — Telehealth: Payer: Self-pay

## 2024-04-09 NOTE — Telephone Encounter (Signed)
 Copied from CRM [id number]. Topic: General - Other >> Apr 08, 2024 10:59 AM Geroldine GRADE wrote: Reason for CRM: Bernardino from [employer] calling because they need a paper stating approved or denied faxed to [phone]  I spoke with pt before regarding this company and she stated that she has not talk with anyone or this company before about her knee and that to just disregard the message

## 2024-04-10 DIAGNOSIS — M25511 Pain in right shoulder: Secondary | ICD-10-CM | POA: Diagnosis not present

## 2024-04-13 DIAGNOSIS — R079 Chest pain, unspecified: Secondary | ICD-10-CM | POA: Diagnosis not present

## 2024-04-13 DIAGNOSIS — I10 Essential (primary) hypertension: Secondary | ICD-10-CM | POA: Diagnosis not present

## 2024-04-13 DIAGNOSIS — R001 Bradycardia, unspecified: Secondary | ICD-10-CM | POA: Diagnosis not present

## 2024-04-13 DIAGNOSIS — R42 Dizziness and giddiness: Secondary | ICD-10-CM | POA: Diagnosis not present

## 2024-04-13 DIAGNOSIS — G4489 Other headache syndrome: Secondary | ICD-10-CM | POA: Diagnosis not present

## 2024-04-14 DIAGNOSIS — K219 Gastro-esophageal reflux disease without esophagitis: Secondary | ICD-10-CM | POA: Diagnosis not present

## 2024-04-14 DIAGNOSIS — R110 Nausea: Secondary | ICD-10-CM | POA: Diagnosis not present

## 2024-04-14 DIAGNOSIS — R1011 Right upper quadrant pain: Secondary | ICD-10-CM | POA: Diagnosis not present

## 2024-04-14 DIAGNOSIS — Z860100 Personal history of colon polyps, unspecified: Secondary | ICD-10-CM | POA: Diagnosis not present

## 2024-04-24 ENCOUNTER — Emergency Department (HOSPITAL_BASED_OUTPATIENT_CLINIC_OR_DEPARTMENT_OTHER)
Admission: EM | Admit: 2024-04-24 | Discharge: 2024-04-24 | Disposition: A | Discharge status: Home / Self Care | Attending: Emergency Medicine | Admitting: Emergency Medicine

## 2024-04-24 ENCOUNTER — Emergency Department (HOSPITAL_BASED_OUTPATIENT_CLINIC_OR_DEPARTMENT_OTHER)

## 2024-04-24 ENCOUNTER — Other Ambulatory Visit: Payer: Self-pay

## 2024-04-24 DIAGNOSIS — W01198A Fall on same level from slipping, tripping and stumbling with subsequent striking against other object, initial encounter: Secondary | ICD-10-CM | POA: Insufficient documentation

## 2024-04-24 DIAGNOSIS — M799 Soft tissue disorder, unspecified: Secondary | ICD-10-CM | POA: Diagnosis not present

## 2024-04-24 DIAGNOSIS — S8001XA Contusion of right knee, initial encounter: Secondary | ICD-10-CM | POA: Insufficient documentation

## 2024-04-24 DIAGNOSIS — R0600 Dyspnea, unspecified: Secondary | ICD-10-CM | POA: Diagnosis not present

## 2024-04-24 DIAGNOSIS — Z79899 Other long term (current) drug therapy: Secondary | ICD-10-CM | POA: Insufficient documentation

## 2024-04-24 DIAGNOSIS — M19042 Primary osteoarthritis, left hand: Secondary | ICD-10-CM | POA: Diagnosis not present

## 2024-04-24 DIAGNOSIS — S80212A Abrasion, left knee, initial encounter: Secondary | ICD-10-CM | POA: Insufficient documentation

## 2024-04-24 DIAGNOSIS — M25562 Pain in left knee: Secondary | ICD-10-CM | POA: Diagnosis not present

## 2024-04-24 DIAGNOSIS — S8002XA Contusion of left knee, initial encounter: Secondary | ICD-10-CM | POA: Diagnosis not present

## 2024-04-24 DIAGNOSIS — S60052A Contusion of left little finger without damage to nail, initial encounter: Secondary | ICD-10-CM | POA: Diagnosis not present

## 2024-04-24 DIAGNOSIS — Z96652 Presence of left artificial knee joint: Secondary | ICD-10-CM | POA: Diagnosis not present

## 2024-04-24 DIAGNOSIS — S6992XA Unspecified injury of left wrist, hand and finger(s), initial encounter: Secondary | ICD-10-CM | POA: Diagnosis present

## 2024-04-24 DIAGNOSIS — M25561 Pain in right knee: Secondary | ICD-10-CM | POA: Diagnosis not present

## 2024-04-24 DIAGNOSIS — Z23 Encounter for immunization: Secondary | ICD-10-CM | POA: Diagnosis not present

## 2024-04-24 DIAGNOSIS — R0781 Pleurodynia: Secondary | ICD-10-CM | POA: Diagnosis not present

## 2024-04-24 DIAGNOSIS — I10 Essential (primary) hypertension: Secondary | ICD-10-CM | POA: Insufficient documentation

## 2024-04-24 DIAGNOSIS — W19XXXA Unspecified fall, initial encounter: Secondary | ICD-10-CM | POA: Diagnosis not present

## 2024-04-24 DIAGNOSIS — S80211A Abrasion, right knee, initial encounter: Secondary | ICD-10-CM | POA: Insufficient documentation

## 2024-04-24 DIAGNOSIS — M25552 Pain in left hip: Secondary | ICD-10-CM | POA: Diagnosis not present

## 2024-04-24 DIAGNOSIS — Z96651 Presence of right artificial knee joint: Secondary | ICD-10-CM | POA: Diagnosis not present

## 2024-04-24 DIAGNOSIS — M79642 Pain in left hand: Secondary | ICD-10-CM | POA: Diagnosis not present

## 2024-04-24 MED ORDER — ACETAMINOPHEN 500 MG PO TABS
1000.0000 mg | ORAL_TABLET | Freq: Once | ORAL | Status: AC
  Administered 2024-04-24: 1000 mg via ORAL
  Filled 2024-04-24: qty 2

## 2024-04-24 MED ORDER — TETANUS-DIPHTH-ACELL PERTUSSIS 5-2.5-18.5 LF-MCG/0.5 IM SUSY
0.5000 mL | PREFILLED_SYRINGE | Freq: Once | INTRAMUSCULAR | Status: AC
  Administered 2024-04-24: 0.5 mL via INTRAMUSCULAR
  Filled 2024-04-24: qty 0.5

## 2024-04-24 NOTE — ED Provider Notes (Signed)
 [place] [facility] AT [facility] Provider Note   CSN: [id number] Arrival date & time: 04/24/24  8065     Patient presents with: Erin West   GEISHA ABERNATHY is a 73 y.o. female.   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  Reports that same occurred prior to arrival today when she tripped and fell forward.  Reports that she fell on both of her knees and then hit the left side of her ribs as well as her left hand.  She did not hit her head or lose consciousness.  She is not anticoagulated.  She did not attempt to get up afterwards, EMS was called for transport.  Reports pain to both of her knees as well as the left side of her rib cage.  Endorses some discomfort with deep breathing, denies any shortness of breath.  The history is provided by the patient. No language interpreter was used.  Fall       Prior to Admission medications   Medication Sig Start Date End Date Taking? Authorizing Provider  acetaminophen  (TYLENOL ) 650 MG CR tablet Take 650 mg by mouth every 8 (eight) hours as needed for pain.    [provider]  albuterol  (VENTOLIN  HFA) 108 (90 Base) MCG/ACT inhaler Inhale 2 puffs into the lungs every 4 (four) hours as needed for wheezing or shortness of breath. 02/06/21   Jodie Lavern CROME, MD  atorvastatin  (LIPITOR) 20 MG tablet TAKE 1 TABLET AT BEDTIME 09/24/23   Jodie Lavern CROME, MD  azelastine  (ASTELIN ) 0.1 % nasal spray Place 1 spray into both nostrils 2 (two) times daily. 12/09/23   Jodie Lavern CROME, MD  busPIRone  (BUSPAR ) 7.5 MG tablet Take 1 tablet (7.5 mg total) by mouth 2 (two) times daily as needed (anxiety). 11/20/23   Jodie Lavern CROME, MD  carvedilol  (COREG ) 25 MG tablet Take 1 tablet (25 mg total) by mouth 2 (two) times daily. PLEASE KEEP UPCOMING APPOINTMENT IN ORDER TO RECEIVE FUTURE REFILLS. THANK YOU 03/24/24   Pietro Redell RAMAN, MD  cetirizine   (ZYRTEC ) 10 MG tablet Take 1 tablet (10 mg total) by mouth daily. 03/10/24   Wendolyn Jenkins Jansky, MD  Cholecalciferol (VITAMIN D) 2000 UNITS CAPS Take 2,000 Units by mouth daily.     [provider]  ciclopirox  (PENLAC ) 8 % solution Apply topically at bedtime. Apply over nail and surrounding skin. Apply daily over previous coat. After seven (7) days, may remove with alcohol and continue cycle. 02/21/24   Christine Rush, DPM  diclofenac  Sodium (VOLTAREN ) 1 % GEL Apply 2 g topically 4 (four) times daily as needed (arthritis pain, hands). 06/07/22   Jodie Lavern CROME, MD  famotidine (PEPCID) 40 MG tablet Take 40 mg by mouth 2 (two) times daily. 11/12/21   [provider]  fluticasone  (FLONASE ) 50 MCG/ACT nasal spray Place 2 sprays into both nostrils daily. 12/09/23   Jodie Lavern CROME, MD  LINZESS  290 MCG CAPS capsule Take 1 capsule (290 mcg total) by mouth daily. 12/09/23   Jodie Lavern CROME, MD  montelukast  (SINGULAIR ) 10 MG tablet TAKE 1 TABLET AT BEDTIME 10/17/23   Jodie Lavern CROME, MD  Multiple Vitamin (MULTIVITAMIN) tablet Take 1 tablet by mouth daily.    [provider]  nitrofurantoin , macrocrystal-monohydrate, (MACROBID ) 100 MG capsule Take 1 capsule (100 mg total) by mouth 2 (two) times daily. 03/10/24   Wendolyn Jenkins Jansky, MD  pantoprazole  (PROTONIX ) 40 MG tablet Take  40 mg by mouth daily. 10/14/21   [provider]  polyethylene glycol powder (GLYCOLAX /MIRALAX ) powder Take 17 g by mouth daily as needed. 12/13/17   Jodie Lavern CROME, MD  Probiotic Product (PROBIOTIC PO) Take 1 capsule by mouth daily.     [provider]  solifenacin (VESICARE) 5 MG tablet Take 5 mg by mouth daily.    [provider]    Allergies: Penicillins, Sulfa antibiotics, Sulfasalazine, Tramadol, Ciprofloxacin , Trimethoprim, and Isovue  [iopamidol ]    Review of Systems  Musculoskeletal:  Positive for arthralgias and myalgias.  All other systems reviewed and are negative.   Updated Vital  Signs BP (!) 158/89   Pulse 64   Temp 98.1 F (36.7 C) (Oral)   Resp 20   LMP 09/24/2012 (Approximate)   SpO2 97%   Physical Exam Vitals and nursing note reviewed.  Constitutional:      General: She is not in acute distress.    Appearance: Normal appearance. She is normal weight. She is not ill-appearing, toxic-appearing or diaphoretic.  HENT:     Head: Normocephalic and atraumatic.     Comments: No racoon eyes No battle sign Eyes:     Extraocular Movements: Extraocular movements intact.     Pupils: Pupils are equal, round, and reactive to light.  Cardiovascular:     Rate and Rhythm: Normal rate and regular rhythm.     Heart sounds: Normal heart sounds.     Comments: No tenderness to palpation of the anterior chest wall Pulmonary:     Effort: Pulmonary effort is normal. No respiratory distress.     Breath sounds: Normal breath sounds.  Chest:     Comments: TTP noted to the left lateral ribcage. No obvious deformity. No bruising or crepitus. Lung sounds clear to auscultation bilaterally Abdominal:     General: Abdomen is flat.     Palpations: Abdomen is soft.     Tenderness: There is no abdominal tenderness.     Comments: No abdominal tenderness or bruising  Musculoskeletal:        General: Normal range of motion.     Cervical back: Normal, normal range of motion and neck supple.     Thoracic back: Normal.     Lumbar back: Normal.     Comments: No midline tenderness, no stepoffs or deformity noted on palpation of cervical, thoracic, and lumbar spine  Base of left pinky with small bruise present without deformity. ROM intact without pain  Tenderness noted to palpation of the bilateral anterior knees with superficial skin abrasions noted. No active bleeding. Mild bruising noted bilateral, worse on the right. Able to hold her legs off the bed without pain.  DP and PT pulses intact and pulses.  No obvious deformity.  Observed to be ambulatory with steady gait  Skin:     General: Skin is warm and dry.  Neurological:     General: No focal deficit present.     Mental Status: She is alert and oriented to person, place, and time.  Psychiatric:        Mood and Affect: Mood normal.        Behavior: Behavior normal.     (all labs ordered are listed, but only abnormal results are displayed) Labs Reviewed - No data to display  EKG: None  Radiology: DG Hand Complete Left Result Date: 04/24/2024 CLINICAL DATA:  Mechanical fall.  Left hand pain. EXAM: LEFT HAND - COMPLETE 3+ VIEW COMPARISON:  None Available. FINDINGS: Degenerative changes in  the interphalangeal joints, first carpometacarpal and STT joints. Prominent soft tissue calcifications around the second distal interphalangeal joint consistent with productive degenerative change. No evidence of acute fracture or dislocation. No focal bone lesion or bone destruction. Soft tissues are unremarkable. IMPRESSION: Prominent degenerative changes in the left hand and wrist. No acute bony abnormalities. Electronically Signed   By: Elsie Gravely M.D.   On: 04/24/2024 22:12   DG Ribs Unilateral W/Chest Left Result Date: 04/24/2024 CLINICAL DATA:  Fall.  Mechanical fall.  Left rib pain. EXAM: LEFT RIBS AND CHEST - 3+ VIEW COMPARISON:  Chest radiograph 01/18/2022 FINDINGS: Normal heart size and pulmonary vascularity. No focal airspace disease or consolidation in the lungs. No blunting of costophrenic angles. No pneumothorax. Mediastinal contours appear intact. Degenerative changes in the spine and shoulders. Left ribs appear intact. No acute fracture or displacement. No focal bone lesion or bone destruction. Bone cortex appears intact. Soft tissues are unremarkable. IMPRESSION: No evidence of active pulmonary disease.  Negative left ribs. Electronically Signed   By: Elsie Gravely M.D.   On: 04/24/2024 22:11   DG HIP UNILAT WITH PELVIS 2-3 VIEWS LEFT Result Date: 04/24/2024 CLINICAL DATA:  Pain after a fall. EXAM: DG HIP  (WITH OR WITHOUT PELVIS) 2-3V LEFT COMPARISON:  None Available. FINDINGS: Pelvis and left hip appear intact. No acute fracture or dislocation. No focal bone lesion or bone destruction. SI joints and symphysis pubis are not displaced. Soft tissues are unremarkable. IMPRESSION: Negative. Electronically Signed   By: Elsie Gravely M.D.   On: 04/24/2024 22:10   DG Knee Complete 4 Views Left Result Date: 04/24/2024 CLINICAL DATA:  Fall.  Mechanical fall.  Bilateral knee pain. EXAM: LEFT KNEE - COMPLETE 4+ VIEW COMPARISON:  None Available. FINDINGS: Left total knee arthroplasty. Patellar femoral component. Components appear well seated. No evidence of acute fracture or dislocation. No focal bone lesion or bone destruction. No significant effusions. Soft tissues are unremarkable. IMPRESSION: Left total knee arthroplasty. Components appear well seated. No acute bony abnormalities. Electronically Signed   By: Elsie Gravely M.D.   On: 04/24/2024 22:09   DG Knee Complete 4 Views Right Result Date: 04/24/2024 EXAM: 4 VIEW(S) XRAY OF THE RIGHT KNEE 04/24/2024 09:50:00 PM COMPARISON: None available. CLINICAL HISTORY: Fall. Mechanical fall. Pt reports she was holding a tray when she lost her footing causing her to fall forwards. Reports bilateral knee pain, left hand pain, and left sided rib pain. FINDINGS: BONES AND JOINTS: Right knee arthroplasty. No evidence of loosening. No acute fracture or dislocation. No knee joint effusion. SOFT TISSUES: The soft tissues are unremarkable. IMPRESSION: 1. No acute fracture or dislocation. 2. Right knee arthroplasty without evidence of loosening. Electronically signed by: Norman Gatlin MD 04/24/2024 10:09 PM EDT RP Workstation: [id number]     Procedures   Medications Ordered in the ED  acetaminophen  (TYLENOL ) tablet 1,000 mg (1,000 mg Oral Given 04/24/24 2110)  Tdap (BOOSTRIX ) injection 0.5 mL (0.5 mLs Intramuscular Given 04/24/24 2111)                                     Medical Decision Making Amount and/or Complexity of Data Reviewed Radiology: ordered.  Risk OTC drugs. Prescription drug management.   Patient presents today with complaints of mechanical fall immediately prior to arrival today.  They are afebrile, nontoxic-appearing, and in no acute distress with reassuring vital signs.  Physical exam reveals   No  midline tenderness, no stepoffs or deformity noted on palpation of cervical, thoracic, and lumbar spine  Base of left pinky with small bruise present without deformity. ROM intact without pain  Tenderness noted to palpation of the bilateral anterior knees with superficial skin abrasions noted. No active bleeding. Mild bruising noted bilateral, worse on the right. Able to hold her legs off the bed without pain.  DP and PT pulses intact and pulses.  No obvious deformity.  Observed to be ambulatory with steady gait  Patient without signs of serious head, neck, or back injury. No midline spinal tenderness or TTP of the chest or abd.  Normal neurological exam. No concern for closed head injury, lung injury, or intraabdominal injury. X-ray imaging obtained of the left and right knees, left ribcage, left hand, left hip which has resulted and reveals  DG left hand: Prominent degenerative changes in the left hand and wrist. No acute bony abnormalities.  DG chest: No evidence of active pulmonary disease. Negative left ribs.   DG left hip: negative  Right knee:  1. No acute fracture or dislocation. 2. Right knee arthroplasty without evidence of loosening.  Left knee: Left total knee arthroplasty. Components appear well seated. No acute bony abnormalities.   I have personally reviewed and interpreted this imaging and agree with radiology interpretation.  Radiology without acute abnormality.  Patient is able to ambulate without difficulty in the ED.  Pt is hemodynamically stable, in NAD.   Pain has been managed & pt has no complaints prior to  dc.  Patient counseled on typical course of muscle stiffness and soreness post-fall. Discussed s/s that should cause them to return. Patient instructed on tylenol  use.  Encouraged PCP follow-up for recheck if symptoms are not improved in one week. Evaluation and diagnostic testing in the emergency department does not suggest an emergent condition requiring admission or immediate intervention beyond what has been performed at this time.  Plan for discharge with close PCP follow-up.  Patient is understanding and amenable with plan, educated on red flag symptoms that would prompt immediate return.  Patient discharged in stable condition.  Final diagnoses:  Fall, initial encounter    ED Discharge Orders     None     An After Visit Summary was printed and given to the patient.      Nora Lauraine DELENA DEVONNA 04/24/24 2344    Doretha Folks, MD 04/27/24 [phone]

## 2024-04-24 NOTE — Discharge Instructions (Signed)
 You sustained a fall and have been diagnosed with muscular injuries as result of this accident.    You will likely experience muscle spasms, muscle aches, and bruising as a result of these injuries.  Ultimately these injuries will take time to heal.  Rest, hydration, gentle exercise and stretching will aid in recovery from his injuries.    Using medication such as Tylenol  will help alleviate pain as well as decrease swelling and inflammation associated with these injuries. You may use up to 1000 mg of Tylenol  every 6 hours.   Do not exceed 4000 mg of Tylenol  within 24 hours.  If your fall was today you will likely feel far more achy and painful tomorrow morning.  This is to be expected. Salt water/Epson salt soaks, massage, icy hot/Biofreeze/BenGay and other similar products can help with symptoms.  Please call your PCP to schedule follow-up appointment in the next few days.  Please return to the emergency department for reevaluation if you denies any new or concerning symptoms.

## 2024-04-24 NOTE — ED Triage Notes (Signed)
 BIB [facility] following an mechanical fall  Pt reports she was holding a tray when she lost her footing causing her to fall forwards. Reports right knee pain, right hand pain, left wrist, and left sided rib pain.

## 2024-04-24 NOTE — ED Notes (Signed)
 Pt ambulated with steady gait in the hallway to the restroom. Pt ambulated independently w/ steady gait back to room. EDP notified.

## 2024-04-27 ENCOUNTER — Encounter: Payer: Self-pay | Admitting: Cardiology

## 2024-04-29 DIAGNOSIS — N3941 Urge incontinence: Secondary | ICD-10-CM | POA: Diagnosis not present

## 2024-04-29 DIAGNOSIS — R350 Frequency of micturition: Secondary | ICD-10-CM | POA: Diagnosis not present

## 2024-04-30 ENCOUNTER — Other Ambulatory Visit (HOSPITAL_COMMUNITY)
Admission: RE | Admit: 2024-04-30 | Discharge: 2024-04-30 | Disposition: A | Discharge status: Home / Self Care | Source: Ambulatory Visit | Attending: Obstetrics and Gynecology | Admitting: Obstetrics and Gynecology

## 2024-04-30 ENCOUNTER — Ambulatory Visit: Admitting: Obstetrics and Gynecology

## 2024-04-30 ENCOUNTER — Encounter: Payer: Self-pay | Admitting: Obstetrics and Gynecology

## 2024-04-30 VITALS — BP 134/92 | HR 70 | Ht 61.5 in | Wt 195.0 lb

## 2024-04-30 DIAGNOSIS — M8589 Other specified disorders of bone density and structure, multiple sites: Secondary | ICD-10-CM

## 2024-04-30 DIAGNOSIS — Z1151 Encounter for screening for human papillomavirus (HPV): Secondary | ICD-10-CM | POA: Diagnosis not present

## 2024-04-30 DIAGNOSIS — Z124 Encounter for screening for malignant neoplasm of cervix: Secondary | ICD-10-CM | POA: Insufficient documentation

## 2024-04-30 DIAGNOSIS — Z01419 Encounter for gynecological examination (general) (routine) without abnormal findings: Secondary | ICD-10-CM | POA: Diagnosis not present

## 2024-04-30 DIAGNOSIS — N950 Postmenopausal bleeding: Secondary | ICD-10-CM

## 2024-04-30 DIAGNOSIS — Z780 Asymptomatic menopausal state: Secondary | ICD-10-CM | POA: Diagnosis not present

## 2024-04-30 NOTE — Progress Notes (Signed)
 73 y.o. G7P3 Widowed Caucasian female here for a breast and pelvic exam.  Pt reports one incident last week of a yellow discharge. Has not happened again since.  She shows me the tissue which she saved in a Ziplock bag, and it looks like old blood.   Fell last week.  Knees are bruised.  No fractures.   Followed for osteopenia.  No current treatment.   Not sexually active.    PCP: Jodie Lavern CROME, MD   Patient's last menstrual period was 09/24/2012 (approximate).           Sexually active: No.  The current method of family planning is none.    Menopausal hormone therapy:  no Exercising: yes  Water aerobics 3 times per week.  Smoker:  no  OB History     Gravida  3   Para  3   Term      Preterm      AB      Living  3      SAB      IAB      Ectopic      Multiple      Live Births              HEALTH MAINTENANCE: Last 2 paps: 11/10/21 History of abnormal Pap or positive HPV:  yes, cryo in her 20's, normal since Mammogram:  12/30/23 - Breast Comp Category: B, Bi-Rads Category 1: Neg. Colonoscopy:  08/03/16, 2023 - polyps, due in 5 years.  Dr. Kristie.  Bone Density:  01/11/23 Result  Osteopenia hips, FRAX: 12%, 2.4%  Immunization History  Administered Date(s) Administered   Fluad  Quad(high Dose 65+) 07/15/2021, 06/07/2022   Fluad  Trivalent(High Dose 65+) 09/05/2023   Influenza Split 06/24/2013   Influenza, High Dose Seasonal PF 05/23/2016, 06/28/2017, 06/12/2018, 05/17/2019   Influenza, Seasonal, Injecte, Preservative Fre 05/27/2014, 07/11/2015   Influenza,inj,quad, With Preservative 06/25/2019   PFIZER(Purple Top)SARS-COV-2 Vaccination 10/30/2019, 11/24/2019, 06/22/2020, 12/28/2020   Pfizer Covid-19 Vaccine Bivalent Booster 44yrs & up 07/15/2021   Pfizer(Comirnaty )Fall Seasonal Vaccine 12 years and older 01/02/2023, 09/05/2023   Pneumococcal Conjugate-13 10/29/2016   Pneumococcal Polysaccharide-23 07/25/2012, 11/06/2017, 05/17/2019   Tdap 09/24/2010,  12/19/2015, 04/24/2024   Zoster Recombinant(Shingrix) 08/09/2017, 11/06/2017   Zoster, Live 09/25/2011      reports that she has never smoked. She has never used smokeless tobacco. She reports current alcohol use. She reports that she does not use drugs.  Past Medical History:  Diagnosis Date   Anxiety    Arthritis    Aspiration pneumonia (HCC) 2013   Atypical chest pain    Stress test 04/21/10 - post-stress EF=89%. Normal scan.   Diarrhea    Diastolic dysfunction    But normal LV function on ECHO 2011 and low risk Myoview in 2011   DVT (deep venous thrombosis) (HCC)    Dyslipidemia    GERD (gastroesophageal reflux disease)    Hypertension    Osteoarthritis    Osteopenia    Palpitations    Biowatch MCT Monitor 04/16/10-04/22/10    Syncope    Pain-mediated syncope    Past Surgical History:  Procedure Laterality Date   CHOLECYSTECTOMY  2006   KNEE ARTHROSCOPY Right    REPLACEMENT TOTAL KNEE Left    left   REPLACEMENT TOTAL KNEE Right    TUBAL LIGATION  06/1978    Current Outpatient Medications  Medication Sig Dispense Refill   acetaminophen  (TYLENOL ) 650 MG CR tablet Take 650 mg by mouth every 8 (  eight) hours as needed for pain.     albuterol  (VENTOLIN  HFA) 108 (90 Base) MCG/ACT inhaler Inhale 2 puffs into the lungs every 4 (four) hours as needed for wheezing or shortness of breath. 1 each 2   atorvastatin  (LIPITOR) 20 MG tablet TAKE 1 TABLET AT BEDTIME 90 tablet 3   azelastine  (ASTELIN ) 0.1 % nasal spray Place 1 spray into both nostrils 2 (two) times daily. 30 mL 5   busPIRone  (BUSPAR ) 7.5 MG tablet Take 1 tablet (7.5 mg total) by mouth 2 (two) times daily as needed (anxiety). 60 tablet 3   carvedilol  (COREG ) 25 MG tablet Take 1 tablet (25 mg total) by mouth 2 (two) times daily. PLEASE KEEP UPCOMING APPOINTMENT IN ORDER TO RECEIVE FUTURE REFILLS. THANK YOU 60 tablet 2   cetirizine  (ZYRTEC ) 10 MG tablet Take 1 tablet (10 mg total) by mouth daily. 90 tablet 3    Cholecalciferol (VITAMIN D) 2000 UNITS CAPS Take 2,000 Units by mouth daily.      ciclopirox  (PENLAC ) 8 % solution Apply topically at bedtime. Apply over nail and surrounding skin. Apply daily over previous coat. After seven (7) days, may remove with alcohol and continue cycle. 6.6 mL 0   diclofenac  Sodium (VOLTAREN ) 1 % GEL Apply 2 g topically 4 (four) times daily as needed (arthritis pain, hands). 300 g 3   famotidine (PEPCID) 40 MG tablet Take 40 mg by mouth 2 (two) times daily.     fluticasone  (FLONASE ) 50 MCG/ACT nasal spray Place 2 sprays into both nostrils daily. 16 g 2   LINZESS  290 MCG CAPS capsule Take 1 capsule (290 mcg total) by mouth daily. 30 capsule 3   montelukast  (SINGULAIR ) 10 MG tablet TAKE 1 TABLET AT BEDTIME 90 tablet 3   Multiple Vitamin (MULTIVITAMIN) tablet Take 1 tablet by mouth daily.     pantoprazole  (PROTONIX ) 40 MG tablet Take 40 mg by mouth daily.     polyethylene glycol powder (GLYCOLAX /MIRALAX ) powder Take 17 g by mouth daily as needed. 3350 g 1   Probiotic Product (PROBIOTIC PO) Take 1 capsule by mouth daily.      solifenacin (VESICARE) 5 MG tablet Take 5 mg by mouth daily.     nitrofurantoin , macrocrystal-monohydrate, (MACROBID ) 100 MG capsule Take 1 capsule (100 mg total) by mouth 2 (two) times daily. (Patient not taking: Reported on 04/30/2024) 14 capsule 0   No current facility-administered medications for this visit.    ALLERGIES: Penicillins, Sulfa antibiotics, Sulfasalazine, Tramadol, Ciprofloxacin , Trimethoprim, and Isovue  [iopamidol ]  Family History  Problem Relation Age of Onset   Heart disease Mother    Other Mother        leg amputation   Arthritis Mother    Osteoporosis Mother    Mental illness Father    Sudden death Father    Arthritis Father    Thyroid  disease Father    Stroke Paternal Grandfather     Review of Systems  All other systems reviewed and are negative.   PHYSICAL EXAM:  BP (!) 138/90 (BP Location: Left Arm, Patient  Position: Sitting, Cuff Size: Normal)   Pulse 70   Ht 5' 1.5 (1.562 m)   Wt 195 lb (88.5 kg)   LMP 09/24/2012 (Approximate)   BMI 36.25 kg/m     General appearance: alert, cooperative and appears stated age Head: normocephalic, without obvious abnormality, atraumatic Neck: no adenopathy, supple, symmetrical, trachea midline and thyroid  normal to inspection and palpation Lungs: clear to auscultation bilaterally Breasts: normal appearance, no masses  or tenderness, No nipple retraction or dimpling, No nipple discharge or bleeding, No axillary adenopathy Heart: regular rate and rhythm Abdomen: soft, non-tender; no masses, no organomegaly Extremities: extremities normal, atraumatic, no cyanosis or edema Skin: skin color, texture, turgor normal. No rashes or lesions Lymph nodes: cervical, supraclavicular, and axillary nodes normal. Neurologic: grossly normal  Pelvic: External genitalia:  no lesions              No abnormal inguinal nodes palpated.              Urethra:  normal appearing urethra with no masses, tenderness or lesions              Bartholins and Skenes: normal                 Vagina: normal appearing vagina with normal color and discharge, no lesions              Cervix: no lesions              Pap taken: yes Bimanual Exam:  Uterus:  normal size, contour, position, consistency, mobility, non-tender              Adnexa: no mass, fullness, tenderness              Rectal exam: yes.  Confirms.              Anus:  normal sphincter tone, no lesions  Chaperone was present for exam:  Dereck BROCKS, CMA  ASSESSMENT: Encounter for breast and pelvic exam.  Postmenopausal bleeding.  Cervical cancer screening.  Osteopenia of bilateral hips.   PLAN: Mammogram screening discussed. Self breast awareness reviewed. Pap and reflex HRV collected:  yes Guidelines for Calcium , Vitamin D, regular exercise program including cardiovascular and weight bearing exercise. Medication refills:   NA Postmenopausal bleeding discussed and etiologies of atrophy, polyp, infection, hyperplasia and malignancy.  Return for pelvic US  and EMB.  Rationale explained. BMD due in April, 2026 at [place].   She will do her mammogram at the same time.  Follow up:  2 years for breast and pelvic exam.    Additional counseling given.  yes. 20 min  total time was spent for this patient encounter, including preparation, face-to-face counseling with the patient, coordination of care, and documentation of the encounter in addition to doing the breast and pelvic exam.

## 2024-04-30 NOTE — Patient Instructions (Signed)
 Uterine Bleeding After Menopause: What It Means Menopause is the end of a female's fertile years. After menopause, your ovaries can no longer release an egg. You'll no longer be able to get pregnant, and you'll no longer get a period. Any type of bleeding after menopause should be checked by a health care provider, as this is not normal. Treatment will depend on the cause of the bleeding. What can cause bleeding after menopause? Your provider may do tests to find out why you're bleeding. You may have bleeding if: You take hormones to treat the symptoms of menopause. The lining of your uterus thins as a result of low estrogen. The lining of your uterus thickens as a result of too much estrogen. You have cancer in the uterus. You have growths in the uterus, such as: Polyps. Fibroids. Follow these instructions at home: Pay attention to any changes in your symptoms. Let your provider know about them. If told by your provider: Avoid using tampons and douches. Get regular pelvic exams, including Pap tests. Take iron supplements. Change your pads regularly. Take your medicines only as told. Contact a health care provider if: You have pain in your belly. You have headaches. You have a fever or chills. You feel faint or dizzy. Get help right away if: You pass large blood clots. You have heavy bleeding that needs more than 1 pad an hour and you've never had this before. This information is not intended to replace advice given to you by your health care provider. Make sure you discuss any questions you have with your health care provider. Document Revised: 07/23/2023 Document Reviewed: 05/20/2023 Elsevier Patient Education  2025 Elsevier Inc.  EXERCISE AND DIET:  We recommended that you start or continue a regular exercise program for good health. Regular exercise means any activity that makes your heart beat faster and makes you sweat.  We recommend exercising at least 30 minutes per day at  least 3 days a week, preferably 4 or 5.  We also recommend a diet low in fat and sugar.  Inactivity, poor dietary choices and obesity can cause diabetes, heart attack, stroke, and kidney damage, among others.    ALCOHOL AND SMOKING:  Women should limit their alcohol intake to no more than 7 drinks/beers/glasses of wine (combined, not each!) per week. Moderation of alcohol intake to this level decreases your risk of breast cancer and liver damage. And of course, no recreational drugs are part of a healthy lifestyle.  And absolutely no smoking or even second hand smoke. Most people know smoking can cause heart and lung diseases, but did you know it also contributes to weakening of your bones? Aging of your skin?  Yellowing of your teeth and nails?  CALCIUM  AND VITAMIN D:  Adequate intake of calcium  and Vitamin D are recommended.  The recommendations for exact amounts of these supplements seem to change often, but generally speaking 600 mg of calcium  (either carbonate or citrate) and 800 units of Vitamin D per day seems prudent. Certain women may benefit from higher intake of Vitamin D.  If you are among these women, your doctor will have told you during your visit.    PAP SMEARS:  Pap smears, to check for cervical cancer or precancers,  have traditionally been done yearly, although recent scientific advances have shown that most women can have pap smears less often.  However, every woman still should have a physical exam from her gynecologist every year. It will include a breast check, inspection  of the vulva and vagina to check for abnormal growths or skin changes, a visual exam of the cervix, and then an exam to evaluate the size and shape of the uterus and ovaries.  And after 73 years of age, a rectal exam is indicated to check for rectal cancers. We will also provide age appropriate advice regarding health maintenance, like when you should have certain vaccines, screening for sexually transmitted diseases,  bone density testing, colonoscopy, mammograms, etc.   MAMMOGRAMS:  All women over 5 years old should have a yearly mammogram. Many facilities now offer a 3D mammogram, which may cost around $50 extra out of pocket. If possible,  we recommend you accept the option to have the 3D mammogram performed.  It both reduces the number of women who will be called back for extra views which then turn out to be normal, and it is better than the routine mammogram at detecting truly abnormal areas.    COLONOSCOPY:  Colonoscopy to screen for colon cancer is recommended for all women at age 59.  We know, you hate the idea of the prep.  We agree, BUT, having colon cancer and not knowing it is worse!!  Colon cancer so often starts as a polyp that can be seen and removed at colonscopy, which can quite literally save your life!  And if your first colonoscopy is normal and you have no family history of colon cancer, most women don't have to have it again for 10 years.  Once every ten years, you can do something that may end up saving your life, right?  We will be happy to help you get it scheduled when you are ready.  Be sure to check your insurance coverage so you understand how much it will cost.  It may be covered as a preventative service at no cost, but you should check your particular policy.

## 2024-05-01 DIAGNOSIS — S8000XA Contusion of unspecified knee, initial encounter: Secondary | ICD-10-CM | POA: Diagnosis not present

## 2024-05-06 ENCOUNTER — Telehealth: Payer: Self-pay | Admitting: *Deleted

## 2024-05-06 NOTE — Progress Notes (Signed)
 HPI: FU palpitations.  Nuclear study July 2011 showed normal LV function and normal perfusion.  Event monitor July 2011 showed normal sinus rhythm. Echocardiogram September 2019 showed normal LV function, mild diastolic dysfunction.  Monitor October 2019 showed sinus rhythm with PVCs.  Echocardiogram September 2022 showed vigorous LV function, trace mitral and aortic insufficiency.  Follow-up monitor September 2022 showed sinus rhythm with occasional PAC, brief PAT and rare PVC.  Heart monitor March 2023 showed sinus rhythm with occasional PAC, 4 beats of PAT and occasional PVC.  Carotid Dopplers July 2023 showed 1 to 39% bilateral stenosis.  Since last seen, she has occasional palpitations when she consumes tea but otherwise denies dyspnea, chest pain or syncope.  Current Outpatient Medications  Medication Sig Dispense Refill   acetaminophen  (TYLENOL ) 650 MG CR tablet Take 650 mg by mouth every 8 (eight) hours as needed for pain.     albuterol  (VENTOLIN  HFA) 108 (90 Base) MCG/ACT inhaler Inhale 2 puffs into the lungs every 4 (four) hours as needed for wheezing or shortness of breath. 1 each 2   atorvastatin  (LIPITOR) 20 MG tablet TAKE 1 TABLET AT BEDTIME 90 tablet 3   azelastine  (ASTELIN ) 0.1 % nasal spray Place 1 spray into both nostrils 2 (two) times daily. 30 mL 5   busPIRone  (BUSPAR ) 7.5 MG tablet Take 1 tablet (7.5 mg total) by mouth 2 (two) times daily as needed (anxiety). 60 tablet 3   carvedilol  (COREG ) 25 MG tablet Take 1 tablet (25 mg total) by mouth 2 (two) times daily. PLEASE KEEP UPCOMING APPOINTMENT IN ORDER TO RECEIVE FUTURE REFILLS. THANK YOU 60 tablet 2   cetirizine  (ZYRTEC ) 10 MG tablet Take 1 tablet (10 mg total) by mouth daily. 90 tablet 3   Cholecalciferol (VITAMIN D) 2000 UNITS CAPS Take 2,000 Units by mouth daily.      diclofenac  Sodium (VOLTAREN ) 1 % GEL Apply 2 g topically 4 (four) times daily as needed (arthritis pain, hands). 300 g 3   famotidine (PEPCID) 40 MG tablet  Take 40 mg by mouth 2 (two) times daily.     fluticasone  (FLONASE ) 50 MCG/ACT nasal spray Place 2 sprays into both nostrils daily. 16 g 2   LINZESS  290 MCG CAPS capsule Take 1 capsule (290 mcg total) by mouth daily. 30 capsule 3   montelukast  (SINGULAIR ) 10 MG tablet TAKE 1 TABLET AT BEDTIME 90 tablet 3   Multiple Vitamin (MULTIVITAMIN) tablet Take 1 tablet by mouth daily.     pantoprazole  (PROTONIX ) 40 MG tablet Take 40 mg by mouth daily.     Probiotic Product (PROBIOTIC PO) Take 1 capsule by mouth daily.      solifenacin (VESICARE) 5 MG tablet Take 5 mg by mouth daily.     ciclopirox  (PENLAC ) 8 % solution Apply topically at bedtime. Apply over nail and surrounding skin. Apply daily over previous coat. After seven (7) days, may remove with alcohol and continue cycle. 6.6 mL 0   nitrofurantoin , macrocrystal-monohydrate, (MACROBID ) 100 MG capsule Take 1 capsule (100 mg total) by mouth 2 (two) times daily. 14 capsule 0   polyethylene glycol powder (GLYCOLAX /MIRALAX ) powder Take 17 g by mouth daily as needed. 3350 g 1   No current facility-administered medications for this visit.     Past Medical History:  Diagnosis Date   Anxiety    Arthritis    Aspiration pneumonia (HCC) 2013   Atypical chest pain    Stress test 04/21/10 - post-stress EF=89%. Normal scan.  Diarrhea    Diastolic dysfunction    But normal LV function on ECHO 2011 and low risk Myoview in 2011   DVT (deep venous thrombosis) (HCC)    Dyslipidemia    GERD (gastroesophageal reflux disease)    Hypertension    Osteoarthritis    Osteopenia    Palpitations    Bi[employer]atch MCT Monitor 04/16/10-04/22/10    Syncope    Pain-mediated syncope    Past Surgical History:  Procedure Laterality Date   CHOLECYSTECTOMY  2006   KNEE ARTHROSCOPY Right    REPLACEMENT TOTAL KNEE Left    left   REPLACEMENT TOTAL KNEE Right    TUBAL LIGATION  06/1978    Social History   Socioeconomic History   Marital status: Widowed    Spouse name:  Not on file   Number of children: 3   Years of education: Not on file   Highest education level: Some college, no degree  Occupational History   Not on file  Tobacco Use   Smoking status: Never   Smokeless tobacco: Never  Vaping Use   Vaping status: Never Used  Substance and Sexual Activity   Alcohol use: Yes    Alcohol/week: 0.0 standard drinks of alcohol    Comment: 2-3/wine per month   Drug use: No   Sexual activity: Not Currently    Comment: 1st intercourse 73 yo-Fewer than 5 partners - Medicare Low Risk  Other Topics Concern   Not on file  Social History Narrative   Not on file   Social Drivers of Health   Financial Resource Strain: Low Risk  (05/19/2024)   Overall Financial Resource Strain (CARDIA)    Difficulty of Paying Living Expenses: Not hard at all  Food Insecurity: No Food Insecurity (05/19/2024)   Hunger Vital Sign    Worried About Running Out of Food in the Last Year: Never true    Ran Out of Food in the Last Year: Never true  Transportation Needs: No Transportation Needs (05/19/2024)   PRAPARE - Administrator, Civil Service (Medical): No    Lack of Transportation (Non-Medical): No  Physical Activity: Unknown (05/19/2024)   Exercise Vital Sign    Days of Exercise per Week: 3 days    Minutes of Exercise per Session: Not on file  Stress: No Stress Concern Present (05/19/2024)   [employer] of Occupational Health - Occupational Stress Questionnaire    Feeling of Stress: Only a little  Social Connections: Moderately Integrated (05/19/2024)   Social Connection and Isolation Panel    Frequency of Communication with Friends and Family: More than three times a week    Frequency of Social Gatherings with Friends and Family: Three times a week    Attends Religious Services: More than 4 times per year    Active Member of Clubs or Organizations: Yes    Attends Banker Meetings: More than 4 times per year    Marital Status: Widowed   Intimate Partner Violence: Not At Risk (03/19/2023)   Humiliation, Afraid, Rape, and Kick questionnaire    Fear of Current or Ex-Partner: No    Emotionally Abused: No    Physically Abused: No    Sexually Abused: No    Family History  Problem Relation Age of Onset   Heart disease Mother    Other Mother        leg amputation   Arthritis Mother    Osteoporosis Mother    Mental illness Father  Sudden death Father    Arthritis Father    Thyroid  disease Father    Stroke Paternal Grandfather     ROS: no fevers or chills, productive cough, hemoptysis, dysphasia, odynophagia, melena, hematochezia, dysuria, hematuria, rash, seizure activity, orthopnea, PND, pedal edema, claudication. Remaining systems are negative.  Physical Exam: Well-developed well-nourished in no acute distress.  Skin is warm and dry.  HEENT is normal.  Neck is supple.  Chest is clear to auscultation with normal expansion.  Cardiovascular exam is regular rate and rhythm.  Abdominal exam nontender or distended. No masses palpated. Extremities show no edema. neuro grossly intact  EKG Interpretation Date/Time:  Wednesday May 20 2024 10:58:42 EDT Ventricular Rate:  69 PR Interval:  188 QRS Duration:  76 QT Interval:  380 QTC Calculation: 407 R Axis:   -18  Text Interpretation: Normal sinus rhythm Possible Anterolateral infarct Confirmed by Pietro Rogue ([id number]) on 05/20/2024 11:03:10 AM    A/P  1 palpitations-Long history with multiple monitors as outlined above showing PACs, PVCs and brief SVT.  Continue beta-blocker.  Symptoms are controlled.  2 hypertension-patient's blood pressure is controlled.  Continue present medications.  3 hyperlipidemia-continue statin.  Rogue Pietro, MD

## 2024-05-06 NOTE — Telephone Encounter (Signed)
 Spoke with patient. Patient requesting Pap results from 04/30/24/ Advised results still pending. Allow 7 days, office will notify once complete and reviewed by provider.   PUS moved to 8/21, patient declined earlier date.   OV with EMB moved to 10/2.   Advised I will update Dr. Nikki, our office will f/u with results once completed.   Routing to provider for final review. Patient is agreeable to disposition. Will close encounter.

## 2024-05-08 ENCOUNTER — Ambulatory Visit: Payer: Self-pay | Admitting: Obstetrics and Gynecology

## 2024-05-13 ENCOUNTER — Ambulatory Visit (INDEPENDENT_AMBULATORY_CARE_PROVIDER_SITE_OTHER): Payer: Self-pay | Admitting: Podiatry

## 2024-05-13 ENCOUNTER — Encounter: Payer: Self-pay | Admitting: Podiatry

## 2024-05-13 VITALS — Ht 61.5 in | Wt 195.0 lb

## 2024-05-13 DIAGNOSIS — M79672 Pain in left foot: Secondary | ICD-10-CM

## 2024-05-13 DIAGNOSIS — M79671 Pain in right foot: Secondary | ICD-10-CM

## 2024-05-13 DIAGNOSIS — B351 Tinea unguium: Secondary | ICD-10-CM

## 2024-05-13 NOTE — Progress Notes (Signed)
 Patient presents for evaluation and treatment of tenderness and some redness around nails feet.  Tenderness around toes with walking and wearing shoes.  Physical exam:  General appearance: Alert, pleasant, and in no acute distress.  Vascular: Pedal pulses: DP 2/4 B/L, PT 2/4 B/L.  Mild edema lower legs bilaterally  Neurological:    Dermatologic:  Nails thickened, disfigured, discolored 1-5 BL with subungual debris.  Redness and hypertrophic nail folds along nail folds bilaterally but no signs of drainage or infection.  Musculoskeletal:     Diagnosis: 1. Painful onychomycotic nails 1 through 5 bilaterally. 2. Pain toes 1 through 5 bilaterally.  Plan: -Debrided onychomycotic nails 1 through 5 bilaterally.  Sharply debrided nails with a nail nipper and reduced with a power bur.  Return 3 months

## 2024-05-14 ENCOUNTER — Other Ambulatory Visit

## 2024-05-14 DIAGNOSIS — N950 Postmenopausal bleeding: Secondary | ICD-10-CM | POA: Diagnosis not present

## 2024-05-14 LAB — CYTOLOGY - PAP
Comment: NEGATIVE
Diagnosis: UNDETERMINED — AB
Diagnosis: UNDETERMINED — AB

## 2024-05-19 ENCOUNTER — Ambulatory Visit: Payer: Self-pay

## 2024-05-19 NOTE — Telephone Encounter (Signed)
 Appt tomorrow

## 2024-05-19 NOTE — Telephone Encounter (Addendum)
 Patient called in to reschedule acute appointment on 05/20/24 to later time in the day, she didn't realize when she scheduled acute visit that she has a cardiology appointment in the morning. Rescheduled acute visit per patient request.

## 2024-05-19 NOTE — Telephone Encounter (Signed)
 FYI Only or Action Required?: Action required by provider: request for appointment. Would like to be worked in this afternoon if possible  Patient was last seen in primary care on 03/10/2024 by Wendolyn Jenkins Jansky, MD.  Called Nurse Triage reporting Dysuria.  Symptoms began several days ago.  Interventions attempted: Nothing.  Symptoms are: unchanged.  Triage Disposition: See Physician Within 24 Hours  Patient/caregiver understands and will follow disposition?: Yes                    Copied from CRM #[id number]. Topic: Clinical - Red Word Triage >> May 19, 2024 10:40 AM Tinnie BROCKS wrote: Red Word that prompted transfer to Nurse Triage: thinks she has UTI, burning with urination, frequency Reason for Disposition  All other patients with painful urination  (Exception: [1] EITHER frequency or urgency AND [2] has on-call doctor.)  Answer Assessment - Initial Assessment Questions 1. SEVERITY: How bad is the pain?  (e.g., Scale 1-10; mild, moderate, or severe)     1/10 2. FREQUENCY: How many times have you had painful urination today?      3 times 3. PATTERN: Is pain present every time you urinate or just sometimes?      Each time 4. ONSET: When did the painful urination start?      A few days ago 5. FEVER: Do you have a fever? If Yes, ask: What is your temperature, how was it measured, and when did it start?     no 6. PAST UTI: Have you had a urine infection before? If Yes, ask: When was the last time? and What happened that time?      yes 7. CAUSE: What do you think is causing the painful urination?  (e.g., UTI, scratch, Herpes sore)     uti 8. OTHER SYMPTOMS: Do you have any other symptoms? (e.g., blood in urine, flank pain, genital sores, urgency, vaginal discharge)     Urgency, mild burning  Protocols used: Urination Pain - Female-A-AH

## 2024-05-20 ENCOUNTER — Encounter: Payer: Self-pay | Admitting: Cardiology

## 2024-05-20 ENCOUNTER — Encounter: Payer: Self-pay | Admitting: Family

## 2024-05-20 ENCOUNTER — Ambulatory Visit: Attending: Cardiology | Admitting: Cardiology

## 2024-05-20 ENCOUNTER — Ambulatory Visit: Admitting: Internal Medicine

## 2024-05-20 ENCOUNTER — Ambulatory Visit (INDEPENDENT_AMBULATORY_CARE_PROVIDER_SITE_OTHER): Admitting: Family

## 2024-05-20 VITALS — BP 124/73 | HR 67 | Temp 98.4°F | Ht 63.0 in | Wt 197.5 lb

## 2024-05-20 VITALS — BP 121/80 | HR 69 | Ht 63.0 in | Wt 197.0 lb

## 2024-05-20 DIAGNOSIS — R002 Palpitations: Secondary | ICD-10-CM | POA: Diagnosis not present

## 2024-05-20 DIAGNOSIS — R350 Frequency of micturition: Secondary | ICD-10-CM | POA: Diagnosis not present

## 2024-05-20 DIAGNOSIS — E785 Hyperlipidemia, unspecified: Secondary | ICD-10-CM | POA: Insufficient documentation

## 2024-05-20 DIAGNOSIS — I10 Essential (primary) hypertension: Secondary | ICD-10-CM | POA: Insufficient documentation

## 2024-05-20 LAB — POCT URINALYSIS DIPSTICK
Bilirubin, UA: NEGATIVE
Blood, UA: NEGATIVE
Glucose, UA: NEGATIVE
Ketones, UA: NEGATIVE
Leukocytes, UA: NEGATIVE
Nitrite, UA: NEGATIVE
Protein, UA: NEGATIVE
Spec Grav, UA: 1.025 (ref 1.010–1.025)
Urobilinogen, UA: 0.2 U/dL
pH, UA: 6 (ref 5.0–8.0)

## 2024-05-20 NOTE — Progress Notes (Signed)
 Patient ID: Erin West, female    DOB: [birth year], 73 y.o.   MRN: [id number]  Chief Complaint  Patient presents with   Urinary Frequency    Pt c/o urinary frequency. Present for 3 days. Has tried AZO, Which did help slightly.   Discussed the use of AI scribe software for clinical note transcription with the patient, who gave verbal consent to proceed.  History of Present Illness   Erin West is a 73 year old female who presents with urinary frequency and urgency.  Urinary frequency and urgency - Increased frequency and urgency of urination - Episodes sometimes occur without the need to void - Similar episode occurred a couple of months ago with a small amount of bacteria found in urine - Previously prescribed Macrobid , discontinued when urine culture was negative - Currently uses Azo, taken three times daily for two weeks, then twice daily, with some relief - No dysuria, significant pelvic pain, low back pain, vaginal discharge, or itching     Assessment & Plan:     Bladder symptoms with urinary frequency, urgency, and mild pelvic pain Recurrent bladder symptoms with negative urine culture and no bacterial infection. Symptoms managed with Azo, providing some relief. UA negative. - Continue Azo as directed for symptom relief. - Encourage adequate fluid intake to maintain light yellow to clear urine. - Recommend Kegel exercises to strengthen pelvic muscles. - Call back if sx are persisting.     Subjective:    Outpatient Medications Prior to Visit  Medication Sig Dispense Refill   acetaminophen  (TYLENOL ) 650 MG CR tablet Take 650 mg by mouth every 8 (eight) hours as needed for pain.     albuterol  (VENTOLIN  HFA) 108 (90 Base) MCG/ACT inhaler Inhale 2 puffs into the lungs every 4 (four) hours as needed for wheezing or shortness of breath. 1 each 2   atorvastatin  (LIPITOR) 20 MG tablet TAKE 1 TABLET AT BEDTIME 90 tablet 3   azelastine  (ASTELIN ) 0.1 % nasal spray Place 1 spray  into both nostrils 2 (two) times daily. 30 mL 5   busPIRone  (BUSPAR ) 7.5 MG tablet Take 1 tablet (7.5 mg total) by mouth 2 (two) times daily as needed (anxiety). 60 tablet 3   carvedilol  (COREG ) 25 MG tablet Take 1 tablet (25 mg total) by mouth 2 (two) times daily. PLEASE KEEP UPCOMING APPOINTMENT IN ORDER TO RECEIVE FUTURE REFILLS. THANK YOU 60 tablet 2   cetirizine  (ZYRTEC ) 10 MG tablet Take 1 tablet (10 mg total) by mouth daily. 90 tablet 3   Cholecalciferol (VITAMIN D) 2000 UNITS CAPS Take 2,000 Units by mouth daily.      ciclopirox  (PENLAC ) 8 % solution Apply topically at bedtime. Apply over nail and surrounding skin. Apply daily over previous coat. After seven (7) days, may remove with alcohol and continue cycle. 6.6 mL 0   diclofenac  Sodium (VOLTAREN ) 1 % GEL Apply 2 g topically 4 (four) times daily as needed (arthritis pain, hands). 300 g 3   famotidine (PEPCID) 40 MG tablet Take 40 mg by mouth 2 (two) times daily.     fluticasone  (FLONASE ) 50 MCG/ACT nasal spray Place 2 sprays into both nostrils daily. 16 g 2   LINZESS  290 MCG CAPS capsule Take 1 capsule (290 mcg total) by mouth daily. 30 capsule 3   montelukast  (SINGULAIR ) 10 MG tablet TAKE 1 TABLET AT BEDTIME 90 tablet 3   Multiple Vitamin (MULTIVITAMIN) tablet Take 1 tablet by mouth daily.     nitrofurantoin , macrocrystal-monohydrate, (MACROBID )  100 MG capsule Take 1 capsule (100 mg total) by mouth 2 (two) times daily. 14 capsule 0   pantoprazole  (PROTONIX ) 40 MG tablet Take 40 mg by mouth daily.     polyethylene glycol powder (GLYCOLAX /MIRALAX ) powder Take 17 g by mouth daily as needed. 3350 g 1   Probiotic Product (PROBIOTIC PO) Take 1 capsule by mouth daily.      solifenacin (VESICARE) 5 MG tablet Take 5 mg by mouth daily.     No facility-administered medications prior to visit.   Past Medical History:  Diagnosis Date   Anxiety    Arthritis    Aspiration pneumonia (HCC) 2013   Atypical chest pain    Stress test 04/21/10 -  post-stress EF=89%. Normal scan.   Diarrhea    Diastolic dysfunction    But normal LV function on ECHO 2011 and low risk Myoview in 2011   DVT (deep venous thrombosis) (HCC)    Dyslipidemia    GERD (gastroesophageal reflux disease)    Hypertension    Osteoarthritis    Osteopenia    Palpitations    Bi[employer]atch MCT Monitor 04/16/10-04/22/10    Syncope    Pain-mediated syncope   Past Surgical History:  Procedure Laterality Date   CHOLECYSTECTOMY  2006   KNEE ARTHROSCOPY Right    REPLACEMENT TOTAL KNEE Left    left   REPLACEMENT TOTAL KNEE Right    TUBAL LIGATION  06/1978   Allergies  Allergen Reactions   Penicillins Swelling and Rash    Has patient had a PCN reaction causing immediate rash, facial/tongue/throat swelling, SOB or lightheadedness with hypotension: No Has patient had a PCN reaction causing severe rash involving mucus membranes or skin necrosis: No Has patient had a PCN reaction that required hospitalization unknown Has patient had a PCN reaction occurring within the last 10 years: No If all of the above answers are NO, then may proceed with Cephalosporin use.    Sulfa Antibiotics Rash, Swelling and Other (See Comments)   Sulfasalazine Rash and Swelling   Tramadol Other (See Comments)    Rapid heart rate   Ciprofloxacin      Muscular pain   Trimethoprim     rash   Isovue  [Iopamidol ] Hives    Pt had sneezing, itchy throat, a couple of hives and swollen, itchy left eye. Pt was given 50 mg po benadryl , and water.  Dr Gretta checked pt.  We observed pt for 30 mins w/ 5 minute BP checks.  Pt left w/o complication.  Pt will need full premeds in the future.  JINNY Cirri, RTRCT      Objective:    Physical Exam Vitals and nursing note reviewed.  Constitutional:      Appearance: Normal appearance. She is obese.  Cardiovascular:     Rate and Rhythm: Normal rate and regular rhythm.  Pulmonary:     Effort: Pulmonary effort is normal.     Breath sounds: Normal breath sounds.   Musculoskeletal:        General: Normal range of motion.  Skin:    General: Skin is warm and dry.  Neurological:     Mental Status: She is alert.  Psychiatric:        Mood and Affect: Mood normal.        Behavior: Behavior normal.    BP 124/73 (BP Location: Left Arm, Patient Position: Sitting, Cuff Size: Large)   Pulse 67   Temp 98.4 F (36.9 C) (Temporal)   Ht 5' 3 (1.6 m)  Wt 197 lb 8 oz (89.6 kg)   LMP 09/24/2012 (Approximate)   SpO2 95%   BMI 34.99 kg/m  Wt Readings from Last 3 Encounters:  05/20/24 197 lb 8 oz (89.6 kg)  05/20/24 197 lb (89.4 kg)  05/13/24 195 lb (88.5 kg)      Lucius Krabbe, NP

## 2024-05-20 NOTE — Patient Instructions (Signed)

## 2024-05-21 ENCOUNTER — Other Ambulatory Visit

## 2024-05-26 ENCOUNTER — Telehealth: Payer: Self-pay | Admitting: Cardiology

## 2024-05-26 MED ORDER — CARVEDILOL 25 MG PO TABS: 25.0000 mg | ORAL_TABLET | Freq: Two times a day (BID) | ORAL | 0 refills | Status: DC

## 2024-05-26 NOTE — Telephone Encounter (Signed)
*  STAT* If patient is at the pharmacy, call can be transferred to refill team.   1. Which medications need to be refilled? (please list name of each medication and dose if known) carvedilol  (COREG ) 25 MG tablet    2. Would you like to learn more about the convenience, safety, & potential cost savings by using the [facility]?     3. Are you open to using the [facility] (Type [facility]. ).   4. Which pharmacy/location (including street and city if local pharmacy) is medication to be sent to? [facility] [id number] - [place], [place] - [address]    5. Do they need a 30 day or 90 day supply? 10 day supply until she's able to receive her regular refill in the mail.

## 2024-05-26 NOTE — Telephone Encounter (Signed)
 RX sent in

## 2024-05-27 ENCOUNTER — Ambulatory Visit: Admitting: Cardiology

## 2024-05-28 ENCOUNTER — Other Ambulatory Visit: Payer: Self-pay | Admitting: Cardiology

## 2024-05-29 ENCOUNTER — Telehealth: Payer: Self-pay | Admitting: Cardiology

## 2024-05-29 MED ORDER — CARVEDILOL 25 MG PO TABS
25.0000 mg | ORAL_TABLET | Freq: Two times a day (BID) | ORAL | 3 refills | Status: DC
Start: 2024-05-29 — End: 2024-05-29

## 2024-05-29 MED ORDER — CARVEDILOL 25 MG PO TABS: 25.0000 mg | ORAL_TABLET | Freq: Two times a day (BID) | ORAL | 0 refills | Status: AC

## 2024-05-29 NOTE — Telephone Encounter (Signed)
 Pt's medication was sent to both pt's pharmacies as requested. Confirmation received.

## 2024-05-29 NOTE — Telephone Encounter (Signed)
*  STAT* If patient is at the pharmacy, call can be transferred to refill team.   1. Which medications need to be refilled? (please list name of each medication and dose if known) carvedilol  (COREG ) 25 MG tablet    2. Would you like to learn more about the convenience, safety, & potential cost savings by using the [facility]? Na     3. Are you open to using the [facility] (Type [facility]. Na   4. Which pharmacy/location (including street and city if local pharmacy) is medication to be sent to? Pt wants a 10 day supply sent to [facility]  And a 90 day supply sent to [facility]    5. Do they need a 30 day or 90 day supply? 10 day to [facility] and a [facility] day to [facility]

## 2024-06-23 ENCOUNTER — Telehealth: Payer: Self-pay | Admitting: *Deleted

## 2024-06-23 NOTE — Telephone Encounter (Signed)
 Spoke with patient.   Normal PUS 05/14/24  Scheduled for OV with EMB on 10/2. Patient states she was not aware EMB still needed, please confirm.

## 2024-06-23 NOTE — Telephone Encounter (Signed)
 Her pelvic ultrasound was normal. We will need to review her care together before making a final determination if she needs the biopsy or not.   Please have patient keep her appointment with me.

## 2024-06-24 NOTE — Telephone Encounter (Signed)
Spoke with patient, advised as seen below per Dr. Silva. Patient verbalizes understanding and is agreeable.  

## 2024-06-25 ENCOUNTER — Encounter: Payer: Self-pay | Admitting: Obstetrics and Gynecology

## 2024-06-25 ENCOUNTER — Ambulatory Visit (INDEPENDENT_AMBULATORY_CARE_PROVIDER_SITE_OTHER): Admitting: Obstetrics and Gynecology

## 2024-06-25 DIAGNOSIS — N950 Postmenopausal bleeding: Secondary | ICD-10-CM

## 2024-06-25 NOTE — Progress Notes (Signed)
 GYNECOLOGY  VISIT   HPI: 73 y.o.   Widowed  Caucasian female   G3P3 with Patient's last menstrual period was 09/24/2012 (approximate).   here for: follow up of pelvic ultrasound and possible endometrial biopsy for postmenopausal bleeding.   No further bleeding.   Has overactive bladder.  Sees Dr. MacDiarmid.   Takes Vesicare.    GYNECOLOGIC HISTORY: Patient's last menstrual period was 09/24/2012 (approximate). Contraception:  PMP Menopausal hormone therapy:  n/a Last 2 paps:  04/30/24 ASCUS, HR HPV neg, 11/10/21 neg HR HPV neg History of abnormal Pap or positive HPV:  yes, hx cryotherapy in her 20s.  Paps normal following this.   Mammogram:  12/30/23 Breast Density Cat B, BIRADS Cat 1 neg         OB History     Gravida  3   Para  3   Term      Preterm      AB      Living  3      SAB      IAB      Ectopic      Multiple      Live Births                 Patient Active Problem List   Diagnosis Date Noted   Asthma 01/18/2022   Hemoptysis 01/18/2022   Chronic prescription benzodiazepine use 09/20/2021   Depression, major, single episode, moderate (HCC) 11/02/2020   Osteoarthritis of left glenohumeral joint 04/19/2020   OAB (overactive bladder) 10/28/2019   Atrophic vaginitis 10/22/2018   Mixed hyperlipidemia 12/04/2017   Chronic seasonal allergic rhinitis 12/04/2017   Chronic pansinusitis 09/09/2017   Laryngopharyngeal reflux (LPR) 09/09/2017   Chronic cough 07/24/2017   Status post total right knee replacement 01/24/2016   Bilateral leg edema 03/30/2014   History of gastritis 02/10/2014   Dysphagia 01/28/2014   Irritable bowel syndrome (IBS) 09/08/2013   Osteopenia 07/14/2013   Osteoarthritis, multiple sites 07/14/2013   Essential hypertension    Gastroesophageal reflux disease without esophagitis     Past Medical History:  Diagnosis Date   Anxiety    Arthritis    Aspiration pneumonia (HCC) 2013   Atypical chest pain    Stress test 04/21/10  - post-stress EF=89%. Normal scan.   Diarrhea    Diastolic dysfunction    But normal LV function on ECHO 2011 and low risk Myoview in 2011   DVT (deep venous thrombosis) (HCC)    Dyslipidemia    GERD (gastroesophageal reflux disease)    Hypertension    Osteoarthritis    Osteopenia    Palpitations    [employer]atch MCT Monitor 04/16/10-04/22/10    Syncope    Pain-mediated syncope    Past Surgical History:  Procedure Laterality Date   CHOLECYSTECTOMY  2006   KNEE ARTHROSCOPY Right    REPLACEMENT TOTAL KNEE Left    left   REPLACEMENT TOTAL KNEE Right    TUBAL LIGATION  06/1978    Current Outpatient Medications  Medication Sig Dispense Refill   acetaminophen  (TYLENOL ) 650 MG CR tablet Take 650 mg by mouth every 8 (eight) hours as needed for pain.     atorvastatin  (LIPITOR) 20 MG tablet TAKE 1 TABLET AT BEDTIME 90 tablet 3   azelastine  (ASTELIN ) 0.1 % nasal spray Place 1 spray into both nostrils 2 (two) times daily. 30 mL 5   busPIRone  (BUSPAR ) 7.5 MG tablet Take 1 tablet (7.5 mg total) by mouth 2 (two) times daily  as needed (anxiety). 60 tablet 3   carvedilol  (COREG ) 25 MG tablet Take 1 tablet (25 mg total) by mouth 2 (two) times daily. 20 tablet 0   cetirizine  (ZYRTEC ) 10 MG tablet Take 1 tablet (10 mg total) by mouth daily. 90 tablet 3   Cholecalciferol (VITAMIN D) 2000 UNITS CAPS Take 2,000 Units by mouth daily.      famotidine (PEPCID) 40 MG tablet Take 40 mg by mouth 2 (two) times daily.     fluticasone  (FLONASE ) 50 MCG/ACT nasal spray Place 2 sprays into both nostrils daily. 16 g 2   LINZESS  290 MCG CAPS capsule Take 1 capsule (290 mcg total) by mouth daily. 30 capsule 3   montelukast  (SINGULAIR ) 10 MG tablet TAKE 1 TABLET AT BEDTIME 90 tablet 3   Multiple Vitamin (MULTIVITAMIN) tablet Take 1 tablet by mouth daily.     pantoprazole  (PROTONIX ) 40 MG tablet Take 40 mg by mouth daily.     polyethylene glycol powder (GLYCOLAX /MIRALAX ) powder Take 17 g by mouth daily as needed. 3350 g 1    Probiotic Product (PROBIOTIC PO) Take 1 capsule by mouth daily.      solifenacin (VESICARE) 5 MG tablet Take 5 mg by mouth daily.     No current facility-administered medications for this visit.     ALLERGIES: Penicillins, Sulfa antibiotics, Sulfasalazine, Tramadol, Ciprofloxacin , Trimethoprim, and Isovue  [iopamidol ]  Family History  Problem Relation Age of Onset   Heart disease Mother    Other Mother        leg amputation   Arthritis Mother    Osteoporosis Mother    Mental illness Father    Sudden death Father    Arthritis Father    Thyroid  disease Father    Stroke Paternal Grandfather     Social History   Socioeconomic History   Marital status: Widowed    Spouse name: Not on file   Number of children: 3   Years of education: Not on file   Highest education level: Some college, no degree  Occupational History   Not on file  Tobacco Use   Smoking status: Never   Smokeless tobacco: Never  Vaping Use   Vaping status: Never Used  Substance and Sexual Activity   Alcohol use: Yes    Alcohol/week: 0.0 standard drinks of alcohol    Comment: 2-3/wine per month   Drug use: No   Sexual activity: Not Currently    Birth control/protection: Surgical    Comment: tubal, 1st intercourse 73 yo-Fewer than 5 partners - Medicare Low Risk  Other Topics Concern   Not on file  Social History Narrative   Not on file   Social Drivers of Health   Financial Resource Strain: Low Risk  (05/19/2024)   Overall Financial Resource Strain (CARDIA)    Difficulty of Paying Living Expenses: Not hard at all  Food Insecurity: No Food Insecurity (05/19/2024)   Hunger Vital Sign    Worried About Running Out of Food in the Last Year: Never true    Ran Out of Food in the Last Year: Never true  Transportation Needs: No Transportation Needs (05/19/2024)   PRAPARE - Administrator, Civil Service (Medical): No    Lack of Transportation (Non-Medical): No  Physical Activity: Unknown  (05/19/2024)   Exercise Vital Sign    Days of Exercise per Week: 3 days    Minutes of Exercise per Session: Not on file  Stress: No Stress Concern Present (05/19/2024)   [employer]  of Occupational Health - Occupational Stress Questionnaire    Feeling of Stress: Only a little  Social Connections: Moderately Integrated (05/19/2024)   Social Connection and Isolation Panel    Frequency of Communication with Friends and Family: More than three times a week    Frequency of Social Gatherings with Friends and Family: Three times a week    Attends Religious Services: More than 4 times per year    Active Member of Clubs or Organizations: Yes    Attends Banker Meetings: More than 4 times per year    Marital Status: Widowed  Intimate Partner Violence: Not At Risk (03/19/2023)   Humiliation, Afraid, Rape, and Kick questionnaire    Fear of Current or Ex-Partner: No    Emotionally Abused: No    Physically Abused: No    Sexually Abused: No    Review of Systems  See HPI.  PHYSICAL EXAMINATION:   BP 126/82 (BP Location: Left Arm, Patient Position: Sitting)   Pulse 73   LMP 09/24/2012 (Approximate)   SpO2 99%     General appearance: alert, cooperative and appears stated age  Pelvic US  Uterus 5.20 x 3.00 x 2.19 cm.  No myometrial masses.  EMS 1.89 mm. Thin.  No masses.  Symmetrical.  Avascular.  Left ovary 1.41 x 1.43 x 0.96 cm.  Right ovary 1.52 x 1.23 x 1.13 cm. No adnexal masses.  No free fluid.    ASSESSMENT:  Hx postmenopausal bleeding.  Probable atrophy. ASCUS pap, neg HR HPV.  Hx DVT.    PLAN:  Pelvic US  images and report reviewed.  No EMB needed today.  Will biopsy if has future bleeding.  Breast and pelvic exam in 2 years.  20 min  total time was spent for this patient encounter, including preparation, face-to-face counseling with the patient, coordination of care, and documentation of the encounter.

## 2024-06-26 DIAGNOSIS — Z96653 Presence of artificial knee joint, bilateral: Secondary | ICD-10-CM | POA: Diagnosis not present

## 2024-06-26 DIAGNOSIS — S8000XA Contusion of unspecified knee, initial encounter: Secondary | ICD-10-CM | POA: Diagnosis not present

## 2024-06-29 ENCOUNTER — Other Ambulatory Visit (HOSPITAL_BASED_OUTPATIENT_CLINIC_OR_DEPARTMENT_OTHER): Payer: Self-pay

## 2024-06-29 DIAGNOSIS — Z23 Encounter for immunization: Secondary | ICD-10-CM | POA: Diagnosis not present

## 2024-06-29 MED ORDER — FLUZONE HIGH-DOSE 0.5 ML IM SUSY
0.5000 mL | PREFILLED_SYRINGE | Freq: Once | INTRAMUSCULAR | 0 refills | Status: AC
  Filled 2024-06-29: qty 0.5, 1d supply, fill #0

## 2024-06-29 MED ORDER — COMIRNATY 30 MCG/0.3ML IM SUSY
0.3000 mL | PREFILLED_SYRINGE | Freq: Once | INTRAMUSCULAR | 0 refills | Status: AC
Start: 2024-06-29 — End: 2024-06-30
  Filled 2024-06-29: qty 0.3, 1d supply, fill #0

## 2024-07-01 ENCOUNTER — Encounter: Payer: Self-pay | Admitting: Family Medicine

## 2024-07-01 ENCOUNTER — Ambulatory Visit (INDEPENDENT_AMBULATORY_CARE_PROVIDER_SITE_OTHER): Admitting: Family Medicine

## 2024-07-01 VITALS — BP 124/72 | HR 65 | Temp 97.7°F | Ht 63.0 in | Wt 197.6 lb

## 2024-07-01 DIAGNOSIS — I10 Essential (primary) hypertension: Secondary | ICD-10-CM | POA: Diagnosis not present

## 2024-07-01 DIAGNOSIS — R4189 Other symptoms and signs involving cognitive functions and awareness: Secondary | ICD-10-CM | POA: Diagnosis not present

## 2024-07-01 DIAGNOSIS — F321 Major depressive disorder, single episode, moderate: Secondary | ICD-10-CM | POA: Diagnosis not present

## 2024-07-01 NOTE — Patient Instructions (Signed)
 Please return in 6 months for your annual complete physical; please come fasting.  For follow up on chronic medical conditions    If you have any questions or concerns, please don't hesitate to send me a message via MyChart or call the office at [phone]. Thank you for visiting with us  today! It's our pleasure caring for you.    VISIT SUMMARY: During today's visit, we discussed your blood pressure management, knee pain, and memory concerns. We also reviewed your recent immunizations.  YOUR PLAN: -KNEE PAIN AFTER FALL: Your knee pain is due to a fall you had over two months ago. The orthopedic doctor has advised that the soreness will take time to resolve and no immediate intervention is necessary. Please continue to monitor your symptoms and follow any recommendations from your orthopedic doctor.  -MEMORY CONCERNS: You have been experiencing memory issues, which may be related to aging or mood changes, especially after your husband's passing. Emotional distress can affect memory. We will perform a cognitive screening test to evaluate your memory further. If the results are abnormal, we may consider additional testing or a referral to a neurologist. Continue engaging in cognitive activities like crossword puzzles and word games to help with memory.  -HYPERTENSION WITH WHITE COAT EFFECT: Your blood pressure readings are higher in the clinic, likely due to 'white coat syndrome,' but are normal at home. Continue to monitor your blood pressure at home and bring your home blood pressure cuff to your next appointment for comparison. Make sure to measure your blood pressure at heart level at home.  INSTRUCTIONS: Please continue to monitor your blood pressure at home and bring your home blood pressure cuff to your next appointment. We will perform a cognitive screening test to evaluate your memory. If you notice any changes in your knee pain or have any concerns, please follow up with your orthopedic  doctor.                      Contains text generated by [employer]bridge.                                 Contains text generated by Abridge.

## 2024-07-01 NOTE — Progress Notes (Signed)
 Subjective  CC:  Chief Complaint  Patient presents with   Hypertension   Hyperlipidemia   Depression    HPI: Erin West is a 73 y.o. female who presents to the office today to address the problems listed above in the chief complaint. Hypertension f/u:  Discussed the use of AI scribe software for clinical note transcription with the patient, who gave verbal consent to proceed.  History of Present Illness Erin West is a 73 year old female who presents for follow-up on her blood pressure management.  Hypertension - Blood pressure readings at home consistently normal, typically in the 120s/70s range - Elevated blood pressure readings during office visits, attributed to 'white coat syndrome' - Did not bring home blood pressure cuff to appointment - Blood pressure was normal during recent visits to orthopedic doctor and gynecologist, I reviewed  Knee pain - Knee pain ongoing for a little over two months following a fall in which she landed on both knees - Attributes the fall to the shoes she was wearing at the time - Evaluated by orthopedic doctor, advised that soreness would take time to resolve  Cognitive concerns - Ongoing memory issues described as 'forgetting stuff' - Concerned about whether memory issues are due to aging - Engages in word games to help with memory - Associates memory concerns with a difficult period following her husband's passing  Immunizations - Received flu shot last Thursday - Received COVID vaccine in the same arm as flu shot   Assessment  1. White coat syndrome with diagnosis of hypertension   2. Depression, major, single episode, moderate (HCC)   3. Concern about memory      Plan  Assessment and Plan Assessment & Plan Knee pain after fall Persistent knee pain following a fall over two months ago. Pain is attributed to the fall and is expected to resolve over time. Orthopedic evaluation suggests no acute intervention is  necessary.  Memory concerns Reports of memory issues, possibly age-related or mood-related, especially following the passing of her husband. Differential includes early cognitive impairment or dementia. Mood may be impacting memory, as emotional distress can affect cognitive function. - Perform cognitive screening test at [facility] - Consider further testing or neurology referral if cognitive screening is abnormal. - Engage in cognitive activities such as crossword puzzles and word games. - Discussed how mood can play a role in attention and memory.  Fortunately, mood is doing better today.  Depression is controlled.  Hypertension with white coat effect Blood pressure readings are elevated in the clinic setting, likely due to white coat effect, as home readings are consistently normal. Current readings in the clinic were 170/98 initially, and 152/88 upon recheck with proper arm positioning. - Continue monitoring blood pressure at home. - Bring home blood pressure cuff to next appointment for comparison. - Ensure blood pressure is measured at heart level at home.     Education regarding management of these chronic disease states was given. Management strategies discussed on successive visits include dietary and exercise recommendations, goals of achieving and maintaining IBW, and lifestyle modifications aiming for adequate sleep and minimizing stressors.  Follow up: 6 months for complete physical  No orders of the defined types were placed in this encounter.  No orders of the defined types were placed in this encounter.     BP Readings from Last 3 Encounters:  07/01/24 124/72  06/25/24 126/82  05/20/24 124/73   Wt Readings from Last 3 Encounters:  07/01/24 197 lb  9.6 oz (89.6 kg)  05/20/24 197 lb 8 oz (89.6 kg)  05/20/24 197 lb (89.4 kg)    Lab Results  Component Value Date   CHOL 156 12/26/2023   CHOL 151 12/24/2022   CHOL 151 11/21/2021   Lab Results  Component Value Date    HDL 52.10 12/26/2023   HDL 55.00 12/24/2022   HDL 71 11/21/2021   Lab Results  Component Value Date   LDLCALC 89 12/26/2023   LDLCALC 81 12/24/2022   LDLCALC 69 11/21/2021   Lab Results  Component Value Date   TRIG 74.0 12/26/2023   TRIG 78.0 12/24/2022   TRIG 54 11/21/2021   Lab Results  Component Value Date   CHOLHDL 3 12/26/2023   CHOLHDL 3 12/24/2022   CHOLHDL 2.1 11/21/2021   No results found for: LDLDIRECT Lab Results  Component Value Date   CREATININE 0.97 12/26/2023   BUN 20 12/26/2023   NA 138 12/26/2023   K 4.4 12/26/2023   CL 105 12/26/2023   CO2 28 12/26/2023    The 10-year ASCVD risk score (Arnett DK, et al., 2019) is: 15.6%   Values used to calculate the score:     Age: 10 years     Clincally relevant sex: Female     Is Non-Hispanic African American: No     Diabetic: No     Tobacco smoker: No     Systolic Blood Pressure: 124 mmHg     Is BP treated: Yes     HDL Cholesterol: 52.1 mg/dL     Total Cholesterol: 156 mg/dL  I reviewed the patients updated PMH, FH, and SocHx.    Patient Active Problem List   Diagnosis Date Noted   Chronic prescription benzodiazepine use 09/20/2021    Priority: High   Depression, major, single episode, moderate (HCC) 11/02/2020    Priority: High   Mixed hyperlipidemia 12/04/2017    Priority: High   Chronic cough 07/24/2017    Priority: High   White coat syndrome with diagnosis of hypertension     Priority: High   Osteoarthritis of left glenohumeral joint 04/19/2020    Priority: Medium    OAB (overactive bladder) 10/28/2019    Priority: Medium    Chronic seasonal allergic rhinitis 12/04/2017    Priority: Medium    Chronic pansinusitis 09/09/2017    Priority: Medium    Laryngopharyngeal reflux (LPR) 09/09/2017    Priority: Medium    Bilateral leg edema 03/30/2014    Priority: Medium    History of gastritis 02/10/2014    Priority: Medium    Dysphagia 01/28/2014    Priority: Medium    Irritable bowel  syndrome (IBS) 09/08/2013    Priority: Medium    Osteopenia 07/14/2013    Priority: Medium    Osteoarthritis, multiple sites 07/14/2013    Priority: Medium    Gastroesophageal reflux disease without esophagitis     Priority: Medium    Atrophic vaginitis 10/22/2018    Priority: Low   Status post total right knee replacement 01/24/2016    Priority: Low   Asthma 01/18/2022   Hemoptysis 01/18/2022    Allergies: Penicillins, Sulfa antibiotics, Sulfasalazine, Tramadol, Ciprofloxacin , Trimethoprim, and Isovue  [iopamidol ]  Social History: Patient  reports that she has never smoked. She has never used smokeless tobacco. She reports current alcohol use. She reports that she does not use drugs.  Current Meds  Medication Sig   acetaminophen  (TYLENOL ) 650 MG CR tablet Take 650 mg by mouth every 8 (eight) hours as  needed for pain.   atorvastatin  (LIPITOR) 20 MG tablet TAKE 1 TABLET AT BEDTIME   azelastine  (ASTELIN ) 0.1 % nasal spray Place 1 spray into both nostrils 2 (two) times daily.   busPIRone  (BUSPAR ) 7.5 MG tablet Take 1 tablet (7.5 mg total) by mouth 2 (two) times daily as needed (anxiety).   carvedilol  (COREG ) 25 MG tablet Take 1 tablet (25 mg total) by mouth 2 (two) times daily.   cetirizine  (ZYRTEC ) 10 MG tablet Take 1 tablet (10 mg total) by mouth daily.   Cholecalciferol (VITAMIN D) 2000 UNITS CAPS Take 2,000 Units by mouth daily.    famotidine (PEPCID) 40 MG tablet Take 40 mg by mouth 2 (two) times daily.   fluticasone  (FLONASE ) 50 MCG/ACT nasal spray Place 2 sprays into both nostrils daily.   LINZESS  290 MCG CAPS capsule Take 1 capsule (290 mcg total) by mouth daily.   montelukast  (SINGULAIR ) 10 MG tablet TAKE 1 TABLET AT BEDTIME   Multiple Vitamin (MULTIVITAMIN) tablet Take 1 tablet by mouth daily.   pantoprazole  (PROTONIX ) 40 MG tablet Take 40 mg by mouth daily.   polyethylene glycol powder (GLYCOLAX /MIRALAX ) powder Take 17 g by mouth daily as needed.   Probiotic Product  (PROBIOTIC PO) Take 1 capsule by mouth daily.    solifenacin (VESICARE) 5 MG tablet Take 5 mg by mouth daily.    Review of Systems: Cardiovascular: negative for chest pain, palpitations, leg swelling, orthopnea Respiratory: negative for SOB, wheezing or persistent cough Gastrointestinal: negative for abdominal pain Genitourinary: negative for dysuria or gross hematuria  Objective  Vitals: BP 124/72 Comment: by consistent home readings  Pulse 65   Temp 97.7 F (36.5 C)   Ht 5' 3 (1.6 m)   Wt 197 lb 9.6 oz (89.6 kg)   LMP 09/24/2012 (Approximate)   SpO2 98%   BMI 35.00 kg/m  General: no acute distress  Psych:  Alert and oriented, normal mood and affect HEENT:  Normocephalic, atraumatic, supple neck  Cardiovascular:  RRR without murmur. no edema Respiratory:  Good breath sounds bilaterally, CTAB with normal respiratory effort Skin:  Warm, no rashes Neurologic:   Mental status is normal Commons side effects, risks, benefits, and alternatives for medications and treatment plan prescribed today were discussed, and the patient expressed understanding of the given instructions. Patient is instructed to call or message via MyChart if he/she has any questions or concerns regarding our treatment plan. No barriers to understanding were identified. We discussed Red Flag symptoms and signs in detail. Patient expressed understanding regarding what to do in case of urgent or emergency type symptoms.  Medication list was reconciled, printed and provided to the patient in AVS. Patient instructions and summary information was reviewed with the patient as documented in the AVS. This note was prepared with assistance of Dragon voice recognition software. Occasional wrong-word or sound-a-like substitutions may have occurred due to the inherent limitation

## 2024-07-02 ENCOUNTER — Ambulatory Visit: Payer: Self-pay | Admitting: Obstetrics and Gynecology

## 2024-07-29 DIAGNOSIS — R351 Nocturia: Secondary | ICD-10-CM | POA: Diagnosis not present

## 2024-07-29 DIAGNOSIS — N3281 Overactive bladder: Secondary | ICD-10-CM | POA: Diagnosis not present

## 2024-07-29 DIAGNOSIS — R350 Frequency of micturition: Secondary | ICD-10-CM | POA: Diagnosis not present

## 2024-08-04 ENCOUNTER — Ambulatory Visit: Admitting: Family Medicine

## 2024-08-04 ENCOUNTER — Ambulatory Visit

## 2024-08-10 ENCOUNTER — Ambulatory Visit (INDEPENDENT_AMBULATORY_CARE_PROVIDER_SITE_OTHER)

## 2024-08-10 ENCOUNTER — Encounter: Payer: Self-pay | Admitting: Family Medicine

## 2024-08-10 ENCOUNTER — Ambulatory Visit (INDEPENDENT_AMBULATORY_CARE_PROVIDER_SITE_OTHER): Admitting: Family Medicine

## 2024-08-10 VITALS — BP 154/78 | HR 67 | Temp 98.0°F | Ht 63.0 in | Wt 198.0 lb

## 2024-08-10 VITALS — BP 154/78 | HR 72 | Temp 97.7°F | Ht 63.0 in | Wt 198.4 lb

## 2024-08-10 DIAGNOSIS — R4189 Other symptoms and signs involving cognitive functions and awareness: Secondary | ICD-10-CM | POA: Diagnosis not present

## 2024-08-10 DIAGNOSIS — Z0000 Encounter for general adult medical examination without abnormal findings: Secondary | ICD-10-CM

## 2024-08-10 DIAGNOSIS — F4381 Prolonged grief disorder: Secondary | ICD-10-CM | POA: Diagnosis not present

## 2024-08-10 DIAGNOSIS — F321 Major depressive disorder, single episode, moderate: Secondary | ICD-10-CM

## 2024-08-10 NOTE — Patient Instructions (Signed)
 Erin West,  Thank you for taking the time for your Medicare Wellness Visit. I appreciate your continued commitment to your health goals. Please review the care plan we discussed, and feel free to reach out if I can assist you further.  Please note that Annual Wellness Visits do not include a physical exam. Some assessments may be limited, especially if the visit was conducted virtually. If needed, we may recommend an in-person follow-up with your provider.  Ongoing Care Seeing your primary care provider every 3 to 6 months helps us  monitor your health and provide consistent, personalized care.   Referrals If a referral was made during today's visit and you haven't received any updates within two weeks, please contact the referred provider directly to check on the status.  Recommended Screenings:  Health Maintenance  Topic Date Due   COVID-19 Vaccine (9 - Pfizer risk 2025-26 season) 12/28/2024   Breast Cancer Screening  12/29/2024   Medicare Annual Wellness Visit  08/10/2025   DEXA scan (bone density measurement)  01/10/2026   Colon Cancer Screening  02/08/2032   DTaP/Tdap/Td vaccine (4 - Td or Tdap) 04/24/2034   Pneumococcal Vaccine for age over 83  Completed   Flu Shot  Completed   Hepatitis C Screening  Completed   Zoster (Shingles) Vaccine  Completed   Meningitis B Vaccine  Aged Out       04/24/2024    7:43 PM  Advanced Directives  Does Patient Have a Medical Advance Directive? Yes  Type of Estate Agent of [place];Living will  Does patient want to make changes to medical advance directive? No - Patient declined  Copy of Healthcare Power of Attorney in Chart? No - copy requested  Would patient like information on creating a medical advance directive? No - Patient declined    Vision: Annual vision screenings are recommended for early detection of glaucoma, cataracts, and diabetic retinopathy. These exams can also reveal signs of chronic conditions such as  diabetes and high blood pressure.  Dental: Annual dental screenings help detect early signs of oral cancer, gum disease, and other conditions linked to overall health, including heart disease and diabetes.  Please see the attached documents for additional preventive care recommendations.

## 2024-08-10 NOTE — Progress Notes (Signed)
 Chief Complaint  Patient presents with   Medicare Wellness     Subjective:   Erin West is a 73 y.o. female who presents for a Medicare Annual Wellness Visit.  Allergies (verified) Penicillins, Sulfa antibiotics, Sulfasalazine, Tramadol, Ciprofloxacin , Trimethoprim, and Isovue  [iopamidol ]   History: Past Medical History:  Diagnosis Date   Anxiety    Arthritis    Aspiration pneumonia (HCC) 2013   Atypical chest pain    Stress test 04/21/10 - post-stress EF=89%. Normal scan.   Diarrhea    Diastolic dysfunction    But normal LV function on ECHO 2011 and low risk Myoview in 2011   DVT (deep venous thrombosis) (HCC)    Dyslipidemia    GERD (gastroesophageal reflux disease)    Hypertension    Osteoarthritis    Osteopenia    Palpitations    [employer]atch MCT Monitor 04/16/10-04/22/10    Syncope    Pain-mediated syncope   Past Surgical History:  Procedure Laterality Date   CHOLECYSTECTOMY  2006   KNEE ARTHROSCOPY Right    REPLACEMENT TOTAL KNEE Left    left   REPLACEMENT TOTAL KNEE Right    TUBAL LIGATION  06/1978   Family History  Problem Relation Age of Onset   Heart disease Mother    Other Mother        leg amputation   Arthritis Mother    Osteoporosis Mother    Mental illness Father    Sudden death Father    Arthritis Father    Thyroid  disease Father    Stroke Paternal Grandfather    Social History   Occupational History   Not on file  Tobacco Use   Smoking status: Never   Smokeless tobacco: Never  Vaping Use   Vaping status: Never Used  Substance and Sexual Activity   Alcohol use: Yes    Alcohol/week: 0.0 standard drinks of alcohol    Comment: 2-3/wine per month   Drug use: No   Sexual activity: Not Currently    Birth control/protection: Surgical    Comment: tubal, 1st intercourse 73 yo-Fewer than 5 partners - Medicare Low Risk   Tobacco Counseling Counseling given: Not Answered  SDOH Screenings   Food Insecurity: No Food Insecurity  (08/10/2024)  Housing: Unknown (08/10/2024)  Transportation Needs: No Transportation Needs (08/10/2024)  Utilities: Not At Risk (08/10/2024)  Alcohol Screen: Low Risk  (05/19/2024)  Depression (PHQ2-9): Low Risk  (08/10/2024)  Financial Resource Strain: Low Risk  (05/19/2024)  Physical Activity: Sufficiently Active (08/10/2024)  Social Connections: Moderately Integrated (08/10/2024)  Stress: No Stress Concern Present (08/10/2024)  Tobacco Use: Low Risk  (08/10/2024)  Health Literacy: Adequate Health Literacy (08/10/2024)   See flowsheets for full screening details  Depression Screen PHQ 2 & 9 Depression Scale- Over the past 2 weeks, how often have you been bothered by any of the following problems? Little interest or pleasure in doing things: 0 Feeling down, depressed, or hopeless (PHQ Adolescent also includes...irritable): 0 PHQ-2 Total Score: 0 Trouble falling or staying asleep, or sleeping too much: 1 Feeling tired or having little energy: 0 Poor appetite or overeating (PHQ Adolescent also includes...weight loss): 0 Feeling bad about yourself - or that you are a failure or have let yourself or your family down: 0 Trouble concentrating on things, such as reading the newspaper or watching television (PHQ Adolescent also includes...like school work): 0 Moving or speaking so slowly that other people could have noticed. Or the opposite - being so fidgety or restless that  you have been moving around a lot more than usual: 0 Thoughts that you would be better off dead, or of hurting yourself in some way: 0 PHQ-9 Total Score: 1 If you checked off any problems, how difficult have these problems made it for you to do your work, take care of things at home, or get along with other people?: Not difficult at all  Depression Treatment Depression Interventions/Treatment : EYV7-0 Score <4 Follow-up Not Indicated     Goals Addressed               This Visit's Progress     lose weight  (pt-stated)        Lose weight        Visit info / Clinical Intake: Medicare Wellness Visit Type:: Subsequent Annual Wellness Visit Persons participating in visit:: patient Medicare Wellness Visit Mode:: In-person (required for WTM) Information given by:: patient Interpreter Needed?: No Pre-visit prep was completed: yes AWV questionnaire completed by patient prior to visit?: no Living arrangements:: (!) lives alone Patient's Overall Health Status Rating: very good Typical amount of pain: none Does pain affect daily life?: no Are you currently prescribed opioids?: no  Dietary Habits and Nutritional Risks How many meals a day?: 2 Eats fruit and vegetables daily?: (!) no (more veg than fruits) Most meals are obtained by: preparing own meals; eating out Diabetic:: no  Functional Status Activities of Daily Living (to include ambulation/medication): Independent Ambulation: Independent with device- listed below Home Assistive Devices/Equipment: Eyeglasses Medication Administration: Independent Home Management: Independent Manage your own finances?: yes Primary transportation is: driving Concerns about vision?: no *vision screening is required for WTM* Concerns about hearing?: no  Fall Screening Falls in the past year?: 1 Number of falls in past year: 1 (related to shoes) Was there an injury with Fall?: 0 Fall Risk Category Calculator: 2 Patient Fall Risk Level: Moderate Fall Risk  Fall Risk Patient at Risk for Falls Due to: No Fall Risks Fall risk Follow up: Falls prevention discussed  Home and Transportation Safety: All rugs have non-skid backing?: yes All stairs or steps have railings?: N/A, no stairs Grab bars in the bathtub or shower?: yes Have non-skid surface in bathtub or shower?: yes Good home lighting?: yes Regular seat belt use?: yes Hospital stays in the last year:: no  Cognitive Assessment Difficulty concentrating, remembering, or making decisions? :  no Will 6CIT or Mini Cog be Completed: no 6CIT or Mini Cog Declined: patient alert, oriented, able to answer questions appropriately and recall recent events  Advance Directives (For Healthcare) Does Patient Have a Medical Advance Directive?: Yes Does patient want to make changes to medical advance directive?: No - Patient declined Type of Advance Directive: Healthcare Power of Attorney Copy of Healthcare Power of Attorney in Chart?: No - copy requested Copy of Living Will in Chart?: No - copy requested Would patient like information on creating a medical advance directive?: No - Patient declined  Reviewed/Updated  Reviewed/Updated: Reviewed All (Medical, Surgical, Family, Medications, Allergies, Care Teams, Patient Goals)        Objective:    Today's Vitals   08/10/24 1044  BP: (!) 154/78  Pulse: 67  Temp: 98 F (36.7 C)  SpO2: 97%  Weight: 198 lb (89.8 kg)  Height: 5' 3 (1.6 m)   Body mass index is 35.07 kg/m.  Current Medications (verified) Outpatient Encounter Medications as of 08/10/2024  Medication Sig   acetaminophen  (TYLENOL ) 650 MG CR tablet Take 650 mg by mouth every 8 (  eight) hours as needed for pain.   atorvastatin  (LIPITOR) 20 MG tablet TAKE 1 TABLET AT BEDTIME   azelastine  (ASTELIN ) 0.1 % nasal spray Place 1 spray into both nostrils 2 (two) times daily.   busPIRone  (BUSPAR ) 7.5 MG tablet Take 1 tablet (7.5 mg total) by mouth 2 (two) times daily as needed (anxiety).   carvedilol  (COREG ) 25 MG tablet Take 1 tablet (25 mg total) by mouth 2 (two) times daily.   cetirizine  (ZYRTEC ) 10 MG tablet Take 1 tablet (10 mg total) by mouth daily.   Cholecalciferol (VITAMIN D) 2000 UNITS CAPS Take 2,000 Units by mouth daily.    famotidine (PEPCID) 40 MG tablet Take 40 mg by mouth 2 (two) times daily.   fluticasone  (FLONASE ) 50 MCG/ACT nasal spray Place 2 sprays into both nostrils daily.   LINZESS  290 MCG CAPS capsule Take 1 capsule (290 mcg total) by mouth daily.    montelukast  (SINGULAIR ) 10 MG tablet TAKE 1 TABLET AT BEDTIME   Multiple Vitamin (MULTIVITAMIN) tablet Take 1 tablet by mouth daily.   pantoprazole  (PROTONIX ) 40 MG tablet Take 40 mg by mouth daily.   polyethylene glycol powder (GLYCOLAX /MIRALAX ) powder Take 17 g by mouth daily as needed.   Probiotic Product (PROBIOTIC PO) Take 1 capsule by mouth daily.    [DISCONTINUED] solifenacin (VESICARE) 5 MG tablet Take 5 mg by mouth daily. (Patient not taking: Reported on 08/10/2024)   No facility-administered encounter medications on file as of 08/10/2024.   Hearing/Vision screen Hearing Screening - Comments:: Pt denies any hearing issues  Vision Screening - Comments:: Wears rx glasses - up to date with routine eye exams with [facility]  Immunizations and Health Maintenance Health Maintenance  Topic Date Due   COVID-19 Vaccine (9 - Pfizer risk 2025-26 season) 12/28/2024   Mammogram  12/29/2024   Medicare Annual Wellness (AWV)  08/10/2025   DEXA SCAN  01/10/2026   Colonoscopy  02/08/2032   DTaP/Tdap/Td (4 - Td or Tdap) 04/24/2034   Pneumococcal Vaccine: 50+ Years  Completed   Influenza Vaccine  Completed   Hepatitis C Screening  Completed   Zoster Vaccines- Shingrix  Completed   Meningococcal B Vaccine  Aged Out        Assessment/Plan:  This is a routine wellness examination for Ciella.  Patient Care Team: Jodie Lavern CROME, MD as PCP - General (Family Medicine) Pietro Redell RAMAN, MD as PCP - Cardiology (Cardiology) Georgina Lavelle BROCKS., MD as Consulting Physician (Orthopedic Surgery) Murrell Kuba, MD as Consulting Physician (Orthopedic Surgery) Kristie Lamprey, MD as Consulting Physician (Gastroenterology) Christine Rush, DPM as Consulting Physician (Podiatry) Pietro, Redell RAMAN, MD as Consulting Physician (Cardiology) Karin Delon CROME, NP (Nurse Practitioner) Gaston Hamilton, MD as Consulting Physician (Urology) Veria Abigail HERO, PA-C as Consulting Physician (Dermatology) Duke, Jon Garre, [place] as Physician Assistant (Cardiology)  I have personally reviewed and noted the following in the patient's chart:   Medical and social history Use of alcohol, tobacco or illicit drugs  Current medications and supplements including opioid prescriptions. Functional ability and status Nutritional status Physical activity Advanced directives List of other physicians Hospitalizations, surgeries, and ER visits in previous 12 months Vitals Screenings to include cognitive, depression, and falls Referrals and appointments  No orders of the defined types were placed in this encounter.  In addition, I have reviewed and discussed with patient certain preventive protocols, quality metrics, and best practice recommendations. A written personalized care plan for preventive services as well as general preventive health recommendations were provided  to patient.   Ellouise VEAR Haws, LPN   [id number]   Return 1 year 08/11/25   After Visit Summary: (In Person-Printed) AVS printed and given to the patient  Nurse Notes: nothing significant at this time

## 2024-08-10 NOTE — Progress Notes (Signed)
 Subjective  CC:  Chief Complaint  Patient presents with   Memory Loss    Pt would like to discuss some issuesw that she maybe having with memory     HPI: Erin West is a 73 y.o. female who presents to the office today to address the problems listed above in the chief complaint. Discussed the use of AI scribe software for clinical note transcription with the patient, who gave verbal consent to proceed.  History of Present Illness Erin West is a 73 year old female who presents with concerns about memory issues. See last note where this was first addressed.  Cognitive impairment - [place] lapse [place] [facility] [place], where she forgot that she had already picked up her sandwich and placed it at the table - Easily distracted and unable to recall the sequence of events clearly during the incident - Incident took place in a busy and potentially overwhelming environment while accompanied by friends - No other significant memory issues since the last visit - Concerned about whether the episode was due to distraction or a more serious memory problem  Emotional distress and grief - Experiencing grief and emotional distress following the loss of her husband three years ago, as well as the loss of her best friend and brother - Difficulty at night when alone, often crying when thinking about deceased loved ones Has chronic depression but has declined ssri. On buspar  as needed for anxiety. Overall she reports mood is stable although sadness as noted above.   Cognitive engagement and daily activities - Remains active and engaged by leaving the house regularly - Mindful of responsibilities to her two dogs - Enjoys reading, particularly murder mysteries - Participates in cognitive activities such as Sudoku and word games to maintain cognitive function   Assessment  1. Concern about memory   2. Prolonged grief disorder   3. Depression, major, single episode, moderate (HCC)       Plan  Assessment and Plan Assessment & Plan Adult Wellness Visit Annual wellness visit conducted. Mini mental status exam performed with a perfect score, indicating no immediate concerns regarding cognitive function. - Documented mini mental status exam results  Grief reaction Ongoing grief reaction following the loss of husband, best friend, and brother. Emotional distress affecting daily life and memory. Discussed the normalcy of grief and the potential benefits of grief counseling. Emphasized the importance of moving through grief and living fully. - Provided brochure for grief counseling services - Encouraged exploration of grief counseling options, including in-person and telehealth - Recommended reading grief-related books - Discussed potential benefits of counseling with Dr. Daniel  Subjective memory complaints Reports of memory lapses, such as forgetting to take a sandwich to a table. Mini mental status exam results were reassuring with a perfect score. Memory issues likely related to emotional distress and distraction rather than cognitive decline. - Continue engaging in memory-enhancing activities such as Sudoku and word games - Encouraged maintaining emotional health, adequate sleep, and a balanced diet - Advised on strategies to improve focus and attention, such as slowing down and paying attention  6CIT cog screen score: 0  Follow up: prn No orders of the defined types were placed in this encounter.  No orders of the defined types were placed in this encounter.    I reviewed the patients updated PMH, FH, and SocHx.  Patient Active Problem List   Diagnosis Date Noted   Chronic prescription benzodiazepine use 09/20/2021    Priority: High  Depression, major, single episode, moderate (HCC) 11/02/2020    Priority: High   Mixed hyperlipidemia 12/04/2017    Priority: High   Chronic cough 07/24/2017    Priority: High   White coat syndrome with diagnosis of hypertension      Priority: High   Osteoarthritis of left glenohumeral joint 04/19/2020    Priority: Medium    OAB (overactive bladder) 10/28/2019    Priority: Medium    Chronic seasonal allergic rhinitis 12/04/2017    Priority: Medium    Chronic pansinusitis 09/09/2017    Priority: Medium    Laryngopharyngeal reflux (LPR) 09/09/2017    Priority: Medium    Bilateral leg edema 03/30/2014    Priority: Medium    History of gastritis 02/10/2014    Priority: Medium    Dysphagia 01/28/2014    Priority: Medium    Irritable bowel syndrome (IBS) 09/08/2013    Priority: Medium    Osteopenia 07/14/2013    Priority: Medium    Osteoarthritis, multiple sites 07/14/2013    Priority: Medium    Gastroesophageal reflux disease without esophagitis     Priority: Medium    Atrophic vaginitis 10/22/2018    Priority: Low   Status post total right knee replacement 01/24/2016    Priority: Low   Asthma 01/18/2022   Hemoptysis 01/18/2022   No outpatient medications have been marked as taking for the 08/10/24 encounter (Office Visit) with Jodie Lavern CROME, MD.   Allergies: Patient is allergic to penicillins, sulfa antibiotics, sulfasalazine, tramadol, ciprofloxacin , trimethoprim, and isovue  [iopamidol ]. Family History: Patient family history includes Arthritis in her father and mother; Heart disease in her mother; Mental illness in her father; Osteoporosis in her mother; Other in her mother; Stroke in her paternal grandfather; Sudden death in her father; Thyroid  disease in her father. Social History:  Patient  reports that she has never smoked. She has never used smokeless tobacco. She reports current alcohol use. She reports that she does not use drugs.  Review of Systems: Constitutional: Negative for fever malaise or anorexia Cardiovascular: negative for chest pain Respiratory: negative for SOB or persistent cough Gastrointestinal: negative for abdominal pain  Objective  Vitals: BP (!) 154/78   Pulse 72    Temp 97.7 F (36.5 C)   Ht 5' 3 (1.6 m)   Wt 198 lb 6.4 oz (90 kg)   LMP 09/24/2012 (Approximate)   SpO2 97%   BMI 35.14 kg/m  General: no acute distress , A&Ox3 Psych: normal mood today affect is bright nl speech [employer] side effects, risks, benefits, and alternatives for medications and treatment plan prescribed today were discussed, and the patient expressed understanding of the given instructions. Patient is instructed to call or message via MyChart if he/she has any questions or concerns regarding our treatment plan. No barriers to understanding were identified. We discussed Red Flag symptoms and signs in detail. Patient expressed understanding regarding what to do in case of urgent or emergency type symptoms.  Medication list was reconciled, printed and provided to the patient in AVS. Patient instructions and summary information was reviewed with the patient as documented in the AVS. This note was prepared with assistance of Dragon voice recognition software. Occasional wrong-word or sound-a-like substitutions may have occurred due to the inherent limitations of voice recognition software

## 2024-08-12 ENCOUNTER — Ambulatory Visit (INDEPENDENT_AMBULATORY_CARE_PROVIDER_SITE_OTHER): Admitting: Podiatry

## 2024-08-12 DIAGNOSIS — M25571 Pain in right ankle and joints of right foot: Secondary | ICD-10-CM

## 2024-08-12 DIAGNOSIS — B351 Tinea unguium: Secondary | ICD-10-CM | POA: Diagnosis not present

## 2024-08-12 DIAGNOSIS — M79672 Pain in left foot: Secondary | ICD-10-CM

## 2024-08-12 DIAGNOSIS — M79671 Pain in right foot: Secondary | ICD-10-CM

## 2024-08-12 DIAGNOSIS — M25572 Pain in left ankle and joints of left foot: Secondary | ICD-10-CM

## 2024-08-12 MED ORDER — TRIAMCINOLONE ACETONIDE 40 MG/ML IJ SUSP
40.0000 mg | Freq: Once | INTRAMUSCULAR | Status: AC
  Administered 2024-08-12: 40 mg

## 2024-08-12 NOTE — Progress Notes (Signed)
 Patient presents for evaluation and treatment of tenderness and some redness around nails feet.  Tenderness around toes with walking and wearing shoes.  Complains of pain on the lateral aspect of the foot left.  We injected this several years ago and she is it started to flareup again.  Has not noticed any redness or ecchymosis.  Physical exam:  General appearance: Alert, pleasant, and in no acute distress.  Vascular: Pedal pulses: DP 2/4 B/L, PT 2/4 B/L. Minimal edema lower legs bilaterally.  Capillary refill time immediate bilaterally  Neurologic:  Dermatologic:  Nails thickened, disfigured, discolored 1-5 BL with subungual debris.  Redness and hypertrophic nail folds along nail folds bilaterally but no signs of drainage or infection.  Musculoskeletal:  Tenderness of the fourth fifth met cuboid joint with palpation and range of motion left some tenderness at the peroneus brevis insertion left.   Diagnosis: 1. Painful onychomycotic nails 1 through 5 bilaterally. 2. Pain toes 1 through 5 bilaterally. 3.  Arthralgia fourth fifth met cuboid joint left  Plan: -injected 3cc 2:1 mixture 0.5 cc Marcaine: Triamcinolone  40mg /76ml at fourth fifth met cuboid joint left.  -If pain at lateral foot has not improved in 2 or 3 weeks call for appointment.  -Debrided onychomycotic nails 1 through 5 bilaterally.  Sharply debrided nails with nail clipper and reduced with a power bur.  Return 3 months RFC

## 2024-09-04 DIAGNOSIS — T8484XD Pain due to internal orthopedic prosthetic devices, implants and grafts, subsequent encounter: Secondary | ICD-10-CM | POA: Diagnosis not present

## 2024-09-04 DIAGNOSIS — Z96653 Presence of artificial knee joint, bilateral: Secondary | ICD-10-CM | POA: Diagnosis not present

## 2024-09-04 DIAGNOSIS — M7062 Trochanteric bursitis, left hip: Secondary | ICD-10-CM | POA: Diagnosis not present

## 2024-09-04 DIAGNOSIS — M7061 Trochanteric bursitis, right hip: Secondary | ICD-10-CM | POA: Diagnosis not present

## 2024-09-08 ENCOUNTER — Other Ambulatory Visit (HOSPITAL_BASED_OUTPATIENT_CLINIC_OR_DEPARTMENT_OTHER): Payer: Self-pay

## 2024-09-08 DIAGNOSIS — Z23 Encounter for immunization: Secondary | ICD-10-CM | POA: Diagnosis not present

## 2024-09-08 MED ORDER — PREVNAR 20 0.5 ML IM SUSY
PREFILLED_SYRINGE | INTRAMUSCULAR | 0 refills | Status: AC
  Filled 2024-09-08: qty 0.5, 1d supply, fill #0

## 2024-10-05 ENCOUNTER — Other Ambulatory Visit: Payer: Self-pay

## 2024-10-05 MED ORDER — ATORVASTATIN CALCIUM 20 MG PO TABS: 20.0000 mg | ORAL_TABLET | Freq: Every day | ORAL | 3 refills | Status: AC

## 2024-10-08 ENCOUNTER — Other Ambulatory Visit: Payer: Self-pay | Admitting: Family Medicine

## 2024-10-08 DIAGNOSIS — R0982 Postnasal drip: Secondary | ICD-10-CM

## 2024-10-08 MED ORDER — FLUTICASONE PROPIONATE 50 MCG/ACT NA SUSP: 2.0000 | Freq: Every day | NASAL | 2 refills | Status: AC

## 2024-10-08 NOTE — Telephone Encounter (Signed)
 Copied from CRM [id number]. Topic: Clinical - Medication Refill >> Oct 08, 2024  1:59 PM Gustabo D wrote: Medication: fluticasone  (FLONASE ) 50 MCG/ACT nasal spray- needs 3 month supply  Has the patient contacted their pharmacy? No (Agent: If no, request that the patient contact the pharmacy for the refill. If patient does not wish to contact the pharmacy document the reason why and proceed with request.) (Agent: If yes, when and what did the pharmacy advise?)  This is the patient's preferred pharmacy:  [facility] - [place], [place] - [address] [place] [address] Phone: [phone] Fax: [phone]    Is this the correct pharmacy for this prescription? Yes If no, delete pharmacy and type the correct one.   Has the prescription been filled recently? No  Is the patient out of the medication? Yes  Has the patient been seen for an appointment in the last year OR does the patient have an upcoming appointment? Yes  Can we respond through MyChart? Yes  Agent: Please be advised that Rx refills may take up to 3 business days. We ask that you follow-up with your pharmacy.

## 2024-10-12 ENCOUNTER — Other Ambulatory Visit: Payer: Self-pay | Admitting: Family Medicine

## 2024-11-12 ENCOUNTER — Ambulatory Visit: Admitting: Podiatry

## 2024-12-31 ENCOUNTER — Ambulatory Visit: Admitting: Family Medicine

## 2025-08-11 ENCOUNTER — Ambulatory Visit
